# Patient Record
Sex: Male | Born: 1945
Health system: Southern US, Community
[De-identification: ages and names within clinical notes are randomized; demographics above are authoritative.]

## PROBLEM LIST (undated history)

## (undated) DIAGNOSIS — I10 Essential (primary) hypertension: Secondary | ICD-10-CM

## (undated) DIAGNOSIS — K635 Polyp of colon: Secondary | ICD-10-CM

## (undated) DIAGNOSIS — D649 Anemia, unspecified: Secondary | ICD-10-CM

## (undated) DIAGNOSIS — E7800 Pure hypercholesterolemia, unspecified: Secondary | ICD-10-CM

## (undated) DIAGNOSIS — I429 Cardiomyopathy, unspecified: Secondary | ICD-10-CM

## (undated) DIAGNOSIS — J329 Chronic sinusitis, unspecified: Secondary | ICD-10-CM

## (undated) DIAGNOSIS — K219 Gastro-esophageal reflux disease without esophagitis: Secondary | ICD-10-CM

## (undated) DIAGNOSIS — F1010 Alcohol abuse, uncomplicated: Secondary | ICD-10-CM

## (undated) DIAGNOSIS — N183 Chronic kidney disease, stage 3 (moderate): Secondary | ICD-10-CM

## (undated) HISTORY — DX: Essential (primary) hypertension: I10

## (undated) HISTORY — DX: Cardiomyopathy, unspecified: I42.9

## (undated) HISTORY — DX: Anemia, unspecified: D64.9

## (undated) HISTORY — DX: Pure hypercholesterolemia, unspecified: E78.00

## (undated) HISTORY — DX: Chronic kidney disease, stage 3 (moderate): N18.3

## (undated) HISTORY — PX: OTHER SURGICAL HISTORY: SHX169

## (undated) HISTORY — DX: Alcohol abuse, uncomplicated: F10.10

## (undated) MED FILL — Iron Sucrose Inj 20 MG/ML (Fe Equiv): INTRAVENOUS | Qty: 10 | Status: AC

---

## 2002-11-04 ENCOUNTER — Ambulatory Visit (HOSPITAL_COMMUNITY): Admission: RE | Admit: 2002-11-04 | Discharge: 2002-11-04 | Payer: Self-pay | Admitting: Internal Medicine

## 2006-03-23 ENCOUNTER — Ambulatory Visit: Payer: Self-pay | Admitting: Cardiology

## 2006-04-23 ENCOUNTER — Ambulatory Visit: Payer: Self-pay | Admitting: Cardiology

## 2008-03-31 ENCOUNTER — Ambulatory Visit: Payer: Self-pay | Admitting: Cardiology

## 2013-04-07 DIAGNOSIS — R42 Dizziness and giddiness: Secondary | ICD-10-CM

## 2013-05-10 ENCOUNTER — Encounter (INDEPENDENT_AMBULATORY_CARE_PROVIDER_SITE_OTHER): Payer: Self-pay | Admitting: *Deleted

## 2013-05-16 ENCOUNTER — Encounter (INDEPENDENT_AMBULATORY_CARE_PROVIDER_SITE_OTHER): Payer: Self-pay

## 2013-05-16 ENCOUNTER — Encounter (INDEPENDENT_AMBULATORY_CARE_PROVIDER_SITE_OTHER): Payer: Self-pay | Admitting: *Deleted

## 2013-05-23 ENCOUNTER — Ambulatory Visit (INDEPENDENT_AMBULATORY_CARE_PROVIDER_SITE_OTHER): Payer: Self-pay | Admitting: Internal Medicine

## 2013-10-11 ENCOUNTER — Other Ambulatory Visit (INDEPENDENT_AMBULATORY_CARE_PROVIDER_SITE_OTHER): Payer: Self-pay | Admitting: *Deleted

## 2013-10-11 ENCOUNTER — Encounter (INDEPENDENT_AMBULATORY_CARE_PROVIDER_SITE_OTHER): Payer: Self-pay | Admitting: Internal Medicine

## 2013-10-11 ENCOUNTER — Ambulatory Visit (INDEPENDENT_AMBULATORY_CARE_PROVIDER_SITE_OTHER): Payer: Commercial Indemnity | Admitting: Internal Medicine

## 2013-10-11 ENCOUNTER — Telehealth (INDEPENDENT_AMBULATORY_CARE_PROVIDER_SITE_OTHER): Payer: Self-pay | Admitting: *Deleted

## 2013-10-11 VITALS — BP 128/64 | HR 68 | Temp 98.2°F | Ht 65.0 in | Wt 189.4 lb

## 2013-10-11 DIAGNOSIS — E7800 Pure hypercholesterolemia, unspecified: Secondary | ICD-10-CM | POA: Insufficient documentation

## 2013-10-11 DIAGNOSIS — Z860100 Personal history of colon polyps, unspecified: Secondary | ICD-10-CM | POA: Insufficient documentation

## 2013-10-11 DIAGNOSIS — I10 Essential (primary) hypertension: Secondary | ICD-10-CM | POA: Insufficient documentation

## 2013-10-11 DIAGNOSIS — D649 Anemia, unspecified: Secondary | ICD-10-CM

## 2013-10-11 DIAGNOSIS — D631 Anemia in chronic kidney disease: Secondary | ICD-10-CM | POA: Insufficient documentation

## 2013-10-11 DIAGNOSIS — Z1211 Encounter for screening for malignant neoplasm of colon: Secondary | ICD-10-CM

## 2013-10-11 HISTORY — DX: Anemia, unspecified: D64.9

## 2013-10-11 LAB — CBC WITH DIFFERENTIAL/PLATELET
Basophils Absolute: 0.1 10*3/uL (ref 0.0–0.1)
Basophils Relative: 1 % (ref 0–1)
Eosinophils Absolute: 0.3 10*3/uL (ref 0.0–0.7)
Eosinophils Relative: 4 % (ref 0–5)
HCT: 30.7 % — ABNORMAL LOW (ref 39.0–52.0)
Hemoglobin: 10.5 g/dL — ABNORMAL LOW (ref 13.0–17.0)
Lymphocytes Relative: 32 % (ref 12–46)
Lymphs Abs: 2.1 10*3/uL (ref 0.7–4.0)
MCH: 31.6 pg (ref 26.0–34.0)
MCHC: 34.2 g/dL (ref 30.0–36.0)
MCV: 92.5 fL (ref 78.0–100.0)
Monocytes Absolute: 0.5 10*3/uL (ref 0.1–1.0)
Monocytes Relative: 8 % (ref 3–12)
Neutro Abs: 3.7 10*3/uL (ref 1.7–7.7)
Neutrophils Relative %: 55 % (ref 43–77)
Platelets: 427 10*3/uL — ABNORMAL HIGH (ref 150–400)
RBC: 3.32 MIL/uL — ABNORMAL LOW (ref 4.22–5.81)
RDW: 14.5 % (ref 11.5–15.5)
WBC: 6.6 10*3/uL (ref 4.0–10.5)

## 2013-10-11 MED ORDER — PEG-KCL-NACL-NASULF-NA ASC-C 100 G PO SOLR: 1.0000 | Freq: Once | ORAL | Status: DC

## 2013-10-11 NOTE — Progress Notes (Signed)
Subjective:     Patient ID: Roger Hansen, male   DOB: 1946/06/18, 67 y.o.   MRN: [id number]  HPIJerome is a 67 yr old male referred to our office for surveillance colonoscopy. Ox of colon polyps.   His last colonoscopy wa in 2011. Biopsy revealed. Colon cecum: Tubular adenoma with focal severe dysplasia.   Ascending: Tubular adenoma. Transverse: tubular adenoma. Rectum: tubular adenoma.  Appetite is good. No weight loss.  No abdominal pain.  He has a BM daily. No melena or bright red rectal bleeding per patient.  Hx of anemia and heme positive stools and underwent a Colonoscopy in 2011.  08/08/2013 H and H 11.0 and 34.9, MCV 95, WBC 5.5 Glucose 102, BUN 19, Creatinine 0.99, NA 140, K 3.7, Chloride 101, Calcium 9.5, Protein 7.4, Albumin 4.2, Total bili 0.7, ALP 84, AST 18, ALT 13  Review of Systems see hpi Current Outpatient Prescriptions  Medication Sig Dispense Refill  . aspirin 81 MG tablet Take 81 mg by mouth daily.      . cholecalciferol (VITAMIN D) 400 UNITS TABS tablet Take 1,000 Units by mouth.      . folic acid (FOLVITE) 1 MG tablet Take 1 mg by mouth daily.      Marland Kitchen gemfibrozil (LOPID) 600 MG tablet Take 600 mg by mouth 2 (two) times daily before a meal.      . losartan (COZAAR) 50 MG tablet Take 50 mg by mouth daily.      . potassium chloride (K-DUR) 10 MEQ tablet Take 20 mEq by mouth 2 (two) times daily.      . vitamin B-12 (CYANOCOBALAMIN) 1000 MCG tablet Take 1,000 mcg by mouth daily.       No current facility-administered medications for this visit.   Past Medical History  Diagnosis Date  . Hypertension   . High cholesterol    History reviewed. No pertinent past surgical history. Not on File      Objective:   Physical Exam  Filed Vitals:   10/11/13 1038  BP: 128/64  Pulse: 68  Temp: 98.2 F (36.8 C)  Height: 5\' 5"  (1.651 m)  Weight: 189 lb 6.4 oz (85.911 kg)   Alert and oriented. Skin warm and dry. Oral mucosa is moist.   . Sclera anicteric, conjunctivae is  pink. Thyroid not enlarged. No cervical lymphadenopathy. Lungs clear. Heart regular rate and rhythm.  Abdomen is soft. Bowel sounds are positive. No hepatomegaly. No abdominal masses felt. No tenderness.  No edema to lower extremities.  Stool brown and guaiac negative.     Assessment:    Hx of colon polyps. Needs surveillance colonoscopy. Hx of anemia    Plan:    Surveillance colonoscopy for hx of polyps.  CBC today

## 2013-10-11 NOTE — Patient Instructions (Signed)
CBC, Colonoscopy

## 2013-10-11 NOTE — Telephone Encounter (Signed)
Patient needs movi prep 

## 2013-10-12 ENCOUNTER — Encounter (HOSPITAL_COMMUNITY): Payer: Self-pay | Admitting: Pharmacy Technician

## 2013-10-26 ENCOUNTER — Encounter (HOSPITAL_COMMUNITY)
Admission: RE | Disposition: A | Discharge status: Home / Self Care | Payer: Self-pay | Source: Ambulatory Visit | Attending: Internal Medicine

## 2013-10-26 ENCOUNTER — Ambulatory Visit (HOSPITAL_COMMUNITY)
Admission: RE | Admit: 2013-10-26 | Discharge: 2013-10-26 | Disposition: A | Discharge status: Home / Self Care | Payer: Managed Care, Other (non HMO) | Source: Ambulatory Visit | Attending: Internal Medicine | Admitting: Internal Medicine

## 2013-10-26 ENCOUNTER — Encounter (HOSPITAL_COMMUNITY): Payer: Self-pay | Admitting: *Deleted

## 2013-10-26 DIAGNOSIS — D126 Benign neoplasm of colon, unspecified: Secondary | ICD-10-CM

## 2013-10-26 DIAGNOSIS — K644 Residual hemorrhoidal skin tags: Secondary | ICD-10-CM | POA: Insufficient documentation

## 2013-10-26 DIAGNOSIS — I10 Essential (primary) hypertension: Secondary | ICD-10-CM | POA: Insufficient documentation

## 2013-10-26 DIAGNOSIS — Z860100 Personal history of colon polyps, unspecified: Secondary | ICD-10-CM | POA: Insufficient documentation

## 2013-10-26 DIAGNOSIS — Z79899 Other long term (current) drug therapy: Secondary | ICD-10-CM | POA: Insufficient documentation

## 2013-10-26 DIAGNOSIS — Z09 Encounter for follow-up examination after completed treatment for conditions other than malignant neoplasm: Secondary | ICD-10-CM | POA: Insufficient documentation

## 2013-10-26 HISTORY — PX: COLONOSCOPY: SHX5424

## 2013-10-26 HISTORY — DX: Chronic sinusitis, unspecified: J32.9

## 2013-10-26 HISTORY — DX: Polyp of colon: K63.5

## 2013-10-26 HISTORY — DX: Gastro-esophageal reflux disease without esophagitis: K21.9

## 2013-10-26 SURGERY — COLONOSCOPY
Anesthesia: Moderate Sedation

## 2013-10-26 MED ORDER — STERILE WATER FOR IRRIGATION IR SOLN
Status: DC | PRN
  Administered 2013-10-26: 12:00:00

## 2013-10-26 MED ORDER — SODIUM CHLORIDE 0.9 % IV SOLN
INTRAVENOUS | Status: DC
  Administered 2013-10-26: 12:00:00 via INTRAVENOUS

## 2013-10-26 MED ORDER — MEPERIDINE HCL 50 MG/ML IJ SOLN
INTRAMUSCULAR | Status: AC
  Filled 2013-10-26: qty 1

## 2013-10-26 MED ORDER — MEPERIDINE HCL 50 MG/ML IJ SOLN
INTRAMUSCULAR | Status: DC | PRN
  Administered 2013-10-26 (×2): 25 mg via INTRAVENOUS

## 2013-10-26 MED ORDER — MIDAZOLAM HCL 5 MG/5ML IJ SOLN
INTRAMUSCULAR | Status: AC
  Filled 2013-10-26: qty 10

## 2013-10-26 MED ORDER — MIDAZOLAM HCL 5 MG/5ML IJ SOLN
INTRAMUSCULAR | Status: DC | PRN
Start: 2013-10-26 — End: 2013-10-26
  Administered 2013-10-26 (×3): 2 mg via INTRAVENOUS

## 2013-10-26 NOTE — H&P (Signed)
Roger Hansen is an 67 y.o. male.   Chief Complaint: Patient's here for colonoscopy. HPI: Patient is a 66 year old African American male with history of chronic adenomas and is here for surveillance examination. His last colonoscopy was in June 2011 at [facility] in [place] with removal of multiple adenomas and one polyp had high-grade dysplasia. He denies abdominal pain change in his bowel habits or rectal bleeding. Family history is negative for CRC.  Past Medical History  Diagnosis Date  . Hypertension   . High cholesterol   . GERD (gastroesophageal reflux disease)   . Sinus infection   . Colon polyps     Past Surgical History  Procedure Laterality Date  . Colonoscopy with polypectomy      Family History  Problem Relation Age of Onset  . Colon cancer Neg Hx    Social History:  reports that he has quit smoking. He does not have any smokeless tobacco history on file. He reports that he drinks alcohol. He reports that he does not use illicit drugs.  Allergies: No Known Allergies  Medications Prior to Admission  Medication Sig Dispense Refill  . cholecalciferol (VITAMIN D) 1000 UNITS tablet Take 1,000 Units by mouth 2 (two) times daily.      . folic acid (FOLVITE) 1 MG tablet Take 1 mg by mouth daily.      Marland Kitchen gemfibrozil (LOPID) 600 MG tablet Take 600 mg by mouth 2 (two) times daily before a meal.      . losartan (COZAAR) 50 MG tablet Take 50 mg by mouth daily.      Marland Kitchen omeprazole (PRILOSEC) 20 MG capsule Take 20 mg by mouth daily as needed (Acid Reflux).      . peg 3350 powder (MOVIPREP) 100 G SOLR Take 1 kit (200 g total) by mouth once.  1 kit  0  . potassium chloride (K-DUR) 10 MEQ tablet Take 20 mEq by mouth 2 (two) times daily.      . vitamin B-12 (CYANOCOBALAMIN) 1000 MCG tablet Take 1,000 mcg by mouth daily.      Marland Kitchen aspirin 81 MG tablet Take 81 mg by mouth daily.        No results found for this or any previous visit (from the past 48 hour(s)). No results  found.  ROS  Blood pressure 133/84, pulse 106, temperature 98.3 F (36.8 C), temperature source Oral, height 5\' 5"  (1.651 m), weight 180 lb (81.647 kg), SpO2 98.00%. Physical Exam  Constitutional: He appears well-developed and well-nourished.  HENT:  Mouth/Throat: Oropharynx is clear and moist.  Eyes: Conjunctivae are normal. No scleral icterus.  Neck: No thyromegaly present.  Cardiovascular: Normal rate, regular rhythm and normal heart sounds.   No murmur heard. Respiratory: Effort normal and breath sounds normal.  GI: Soft. He exhibits no distension and no mass. There is no tenderness.  Musculoskeletal: He exhibits no edema.  Lymphadenopathy:    He has no cervical adenopathy.  Neurological: He is alert.  Skin: Skin is warm and dry.     Assessment/Plan History of multiple colonic adenomas. Surveillance colonoscopy.  Roger Hansen,Roger Hansen 10/26/2013, 12:23 PM

## 2013-10-26 NOTE — Op Note (Addendum)
COLONOSCOPY PROCEDURE REPORT  PATIENT:  Roger Hansen  MR#:  [id number] Birthdate:  12-Sep-1946, 67 y.o., male Endoscopist:  Dr. Malissa Hippo, MD Referred By:  Dr. Juliette Alcide, MD  Procedure Date: 10/26/2013  Procedure:   Colonoscopy  Indications:  Patient is 67 year old African American male with history of colonic adenomas who is here for surveillance colonoscopy. His last exam was in June 2011 with removal of multiple polyps which were tubular adenomas and 1 with high-grade dysplasia. Family history is negative for CRC.  Informed Consent:  The procedure and risks were reviewed with the patient and informed consent was obtained.  Medications:  Demerol 50 mg IV Versed 6 mg IV  Description of procedure:  After a digital rectal exam was performed, that colonoscope was advanced from the anus through the rectum and colon to the area of the cecum, ileocecal valve and appendiceal orifice. The cecum was deeply intubated. These structures were well-seen and photographed for the record. From the level of the cecum and ileocecal valve, the scope was slowly and cautiously withdrawn. The mucosal surfaces were carefully surveyed utilizing scope tip to flexion to facilitate fold flattening as needed. The scope was pulled down into the rectum where a thorough exam including retroflexion was performed. Terminal ileum was also examined.  Findings:   Prep satisfactory. Small cecal polyp located at the edge of ileocecal valve. It was cold snared. Small polyp at splenic flexure was also cold snared. Both of these polyps are submitted together. Normal rectal mucosa. Small hemorrhoids below the dentate line.   Therapeutic/Diagnostic Maneuvers Performed:  See above  Complications:  None  Cecal Withdrawal Time:  17 minutes  Impression:  Normal mucosa of terminal ileum. Two small polyps were cold snared and submitted together. These were located at cecum and splenic flexure. Small external  hemorrhoids.  Recommendations:  Standard instructions given. I will contact patient with biopsy results and further recommendations.  Kwane Rohl U  10/26/2013 1:09 PM  CC: Dr. Juliette Alcide, MD & Dr. Bonnetta Barry ref. provider found

## 2013-10-31 ENCOUNTER — Encounter (HOSPITAL_COMMUNITY): Payer: Self-pay | Admitting: Internal Medicine

## 2013-11-04 ENCOUNTER — Encounter (INDEPENDENT_AMBULATORY_CARE_PROVIDER_SITE_OTHER): Payer: Self-pay | Admitting: *Deleted

## 2013-12-05 ENCOUNTER — Encounter (INDEPENDENT_AMBULATORY_CARE_PROVIDER_SITE_OTHER): Payer: Self-pay

## 2014-10-23 DIAGNOSIS — I10 Essential (primary) hypertension: Secondary | ICD-10-CM | POA: Diagnosis not present

## 2014-10-23 DIAGNOSIS — L03316 Cellulitis of umbilicus: Secondary | ICD-10-CM | POA: Diagnosis not present

## 2014-10-23 DIAGNOSIS — Z8249 Family history of ischemic heart disease and other diseases of the circulatory system: Secondary | ICD-10-CM | POA: Diagnosis not present

## 2014-10-23 DIAGNOSIS — Z833 Family history of diabetes mellitus: Secondary | ICD-10-CM | POA: Diagnosis not present

## 2014-10-23 DIAGNOSIS — Z79899 Other long term (current) drug therapy: Secondary | ICD-10-CM | POA: Diagnosis not present

## 2014-10-23 DIAGNOSIS — Z7982 Long term (current) use of aspirin: Secondary | ICD-10-CM | POA: Diagnosis not present

## 2014-10-23 DIAGNOSIS — Z87891 Personal history of nicotine dependence: Secondary | ICD-10-CM | POA: Diagnosis not present

## 2014-10-23 DIAGNOSIS — G629 Polyneuropathy, unspecified: Secondary | ICD-10-CM | POA: Diagnosis not present

## 2014-10-23 DIAGNOSIS — E876 Hypokalemia: Secondary | ICD-10-CM | POA: Diagnosis not present

## 2014-10-25 DIAGNOSIS — L03316 Cellulitis of umbilicus: Secondary | ICD-10-CM | POA: Diagnosis not present

## 2014-10-25 DIAGNOSIS — Z23 Encounter for immunization: Secondary | ICD-10-CM | POA: Diagnosis not present

## 2014-10-25 DIAGNOSIS — R197 Diarrhea, unspecified: Secondary | ICD-10-CM | POA: Diagnosis not present

## 2014-10-26 DIAGNOSIS — R197 Diarrhea, unspecified: Secondary | ICD-10-CM | POA: Diagnosis not present

## 2015-02-20 DIAGNOSIS — K219 Gastro-esophageal reflux disease without esophagitis: Secondary | ICD-10-CM | POA: Diagnosis not present

## 2015-02-20 DIAGNOSIS — E039 Hypothyroidism, unspecified: Secondary | ICD-10-CM | POA: Diagnosis not present

## 2015-02-27 DIAGNOSIS — D649 Anemia, unspecified: Secondary | ICD-10-CM | POA: Diagnosis not present

## 2015-02-27 DIAGNOSIS — L2089 Other atopic dermatitis: Secondary | ICD-10-CM | POA: Diagnosis not present

## 2015-02-27 DIAGNOSIS — K219 Gastro-esophageal reflux disease without esophagitis: Secondary | ICD-10-CM | POA: Diagnosis not present

## 2015-02-27 DIAGNOSIS — G6289 Other specified polyneuropathies: Secondary | ICD-10-CM | POA: Diagnosis not present

## 2015-02-27 DIAGNOSIS — J309 Allergic rhinitis, unspecified: Secondary | ICD-10-CM | POA: Diagnosis not present

## 2015-02-27 DIAGNOSIS — Z1389 Encounter for screening for other disorder: Secondary | ICD-10-CM | POA: Diagnosis not present

## 2015-02-27 DIAGNOSIS — E782 Mixed hyperlipidemia: Secondary | ICD-10-CM | POA: Diagnosis not present

## 2015-02-27 DIAGNOSIS — I10 Essential (primary) hypertension: Secondary | ICD-10-CM | POA: Diagnosis not present

## 2015-02-27 DIAGNOSIS — E559 Vitamin D deficiency, unspecified: Secondary | ICD-10-CM | POA: Diagnosis not present

## 2015-04-19 DIAGNOSIS — J019 Acute sinusitis, unspecified: Secondary | ICD-10-CM | POA: Diagnosis not present

## 2015-07-04 DIAGNOSIS — K219 Gastro-esophageal reflux disease without esophagitis: Secondary | ICD-10-CM | POA: Diagnosis not present

## 2015-07-04 DIAGNOSIS — I10 Essential (primary) hypertension: Secondary | ICD-10-CM | POA: Diagnosis not present

## 2015-07-05 DIAGNOSIS — S40862A Insect bite (nonvenomous) of left upper arm, initial encounter: Secondary | ICD-10-CM | POA: Diagnosis not present

## 2015-07-05 DIAGNOSIS — R634 Abnormal weight loss: Secondary | ICD-10-CM | POA: Diagnosis not present

## 2015-08-14 DIAGNOSIS — K219 Gastro-esophageal reflux disease without esophagitis: Secondary | ICD-10-CM | POA: Diagnosis not present

## 2015-08-14 DIAGNOSIS — E782 Mixed hyperlipidemia: Secondary | ICD-10-CM | POA: Diagnosis not present

## 2015-08-14 DIAGNOSIS — E559 Vitamin D deficiency, unspecified: Secondary | ICD-10-CM | POA: Diagnosis not present

## 2015-08-14 DIAGNOSIS — D649 Anemia, unspecified: Secondary | ICD-10-CM | POA: Diagnosis not present

## 2015-11-23 DIAGNOSIS — I10 Essential (primary) hypertension: Secondary | ICD-10-CM | POA: Diagnosis not present

## 2015-11-23 DIAGNOSIS — R634 Abnormal weight loss: Secondary | ICD-10-CM | POA: Diagnosis not present

## 2015-11-23 DIAGNOSIS — D649 Anemia, unspecified: Secondary | ICD-10-CM | POA: Diagnosis not present

## 2015-11-23 DIAGNOSIS — E785 Hyperlipidemia, unspecified: Secondary | ICD-10-CM | POA: Diagnosis not present

## 2015-11-23 DIAGNOSIS — R197 Diarrhea, unspecified: Secondary | ICD-10-CM | POA: Diagnosis not present

## 2015-11-23 DIAGNOSIS — E782 Mixed hyperlipidemia: Secondary | ICD-10-CM | POA: Diagnosis not present

## 2016-01-10 DIAGNOSIS — H6503 Acute serous otitis media, bilateral: Secondary | ICD-10-CM | POA: Diagnosis not present

## 2016-01-10 DIAGNOSIS — J209 Acute bronchitis, unspecified: Secondary | ICD-10-CM | POA: Diagnosis not present

## 2016-01-10 DIAGNOSIS — J069 Acute upper respiratory infection, unspecified: Secondary | ICD-10-CM | POA: Diagnosis not present

## 2016-01-10 DIAGNOSIS — R0609 Other forms of dyspnea: Secondary | ICD-10-CM | POA: Diagnosis not present

## 2016-07-16 DIAGNOSIS — Z7982 Long term (current) use of aspirin: Secondary | ICD-10-CM | POA: Diagnosis not present

## 2016-07-16 DIAGNOSIS — Z79899 Other long term (current) drug therapy: Secondary | ICD-10-CM | POA: Diagnosis not present

## 2016-07-16 DIAGNOSIS — I10 Essential (primary) hypertension: Secondary | ICD-10-CM | POA: Diagnosis not present

## 2016-07-16 DIAGNOSIS — G629 Polyneuropathy, unspecified: Secondary | ICD-10-CM | POA: Diagnosis not present

## 2016-07-16 DIAGNOSIS — E785 Hyperlipidemia, unspecified: Secondary | ICD-10-CM | POA: Diagnosis not present

## 2016-07-16 DIAGNOSIS — R079 Chest pain, unspecified: Secondary | ICD-10-CM | POA: Diagnosis not present

## 2016-07-16 DIAGNOSIS — D649 Anemia, unspecified: Secondary | ICD-10-CM | POA: Diagnosis not present

## 2016-07-16 DIAGNOSIS — M549 Dorsalgia, unspecified: Secondary | ICD-10-CM | POA: Diagnosis not present

## 2016-07-16 DIAGNOSIS — R072 Precordial pain: Secondary | ICD-10-CM | POA: Diagnosis not present

## 2016-07-17 DIAGNOSIS — K8020 Calculus of gallbladder without cholecystitis without obstruction: Secondary | ICD-10-CM | POA: Diagnosis not present

## 2016-07-24 DIAGNOSIS — R0609 Other forms of dyspnea: Secondary | ICD-10-CM | POA: Diagnosis not present

## 2016-07-24 DIAGNOSIS — E782 Mixed hyperlipidemia: Secondary | ICD-10-CM | POA: Diagnosis not present

## 2016-07-24 DIAGNOSIS — R350 Frequency of micturition: Secondary | ICD-10-CM | POA: Diagnosis not present

## 2016-07-24 DIAGNOSIS — I10 Essential (primary) hypertension: Secondary | ICD-10-CM | POA: Diagnosis not present

## 2016-07-24 DIAGNOSIS — R634 Abnormal weight loss: Secondary | ICD-10-CM | POA: Diagnosis not present

## 2016-07-24 DIAGNOSIS — K219 Gastro-esophageal reflux disease without esophagitis: Secondary | ICD-10-CM | POA: Diagnosis not present

## 2016-07-24 DIAGNOSIS — D649 Anemia, unspecified: Secondary | ICD-10-CM | POA: Diagnosis not present

## 2016-07-25 DIAGNOSIS — I700 Atherosclerosis of aorta: Secondary | ICD-10-CM | POA: Diagnosis not present

## 2016-07-25 DIAGNOSIS — K8020 Calculus of gallbladder without cholecystitis without obstruction: Secondary | ICD-10-CM | POA: Diagnosis not present

## 2016-07-25 DIAGNOSIS — N281 Cyst of kidney, acquired: Secondary | ICD-10-CM | POA: Diagnosis not present

## 2016-08-13 ENCOUNTER — Encounter (HOSPITAL_COMMUNITY): Payer: Medicare Other | Attending: Hematology | Admitting: Hematology

## 2016-08-13 ENCOUNTER — Encounter (HOSPITAL_COMMUNITY): Payer: Medicare Other

## 2016-08-13 ENCOUNTER — Encounter (HOSPITAL_COMMUNITY): Payer: Self-pay

## 2016-08-13 VITALS — BP 104/61 | HR 90 | Temp 98.2°F | Resp 16 | Ht 65.0 in | Wt 165.5 lb

## 2016-08-13 DIAGNOSIS — F1010 Alcohol abuse, uncomplicated: Secondary | ICD-10-CM

## 2016-08-13 DIAGNOSIS — D696 Thrombocytopenia, unspecified: Secondary | ICD-10-CM

## 2016-08-13 DIAGNOSIS — D649 Anemia, unspecified: Secondary | ICD-10-CM | POA: Diagnosis not present

## 2016-08-13 DIAGNOSIS — K8689 Other specified diseases of pancreas: Secondary | ICD-10-CM | POA: Diagnosis not present

## 2016-08-13 DIAGNOSIS — D72819 Decreased white blood cell count, unspecified: Secondary | ICD-10-CM

## 2016-08-13 DIAGNOSIS — K838 Other specified diseases of biliary tract: Secondary | ICD-10-CM | POA: Diagnosis not present

## 2016-08-13 LAB — RETICULOCYTES
RBC.: 3.17 MIL/uL — ABNORMAL LOW (ref 4.22–5.81)
Retic Count, Absolute: 57.1 10*3/uL (ref 19.0–186.0)
Retic Ct Pct: 1.8 % (ref 0.4–3.1)

## 2016-08-13 LAB — CBC WITH DIFFERENTIAL/PLATELET
Basophils Absolute: 0 10*3/uL (ref 0.0–0.1)
Basophils Relative: 1 %
Eosinophils Absolute: 0.3 10*3/uL (ref 0.0–0.7)
Eosinophils Relative: 4 %
HCT: 28.4 % — ABNORMAL LOW (ref 39.0–52.0)
Hemoglobin: 9.2 g/dL — ABNORMAL LOW (ref 13.0–17.0)
Lymphocytes Relative: 24 %
Lymphs Abs: 1.6 10*3/uL (ref 0.7–4.0)
MCH: 29 pg (ref 26.0–34.0)
MCHC: 32.4 g/dL (ref 30.0–36.0)
MCV: 89.6 fL (ref 78.0–100.0)
Monocytes Absolute: 0.4 10*3/uL (ref 0.1–1.0)
Monocytes Relative: 6 %
Neutro Abs: 4.4 10*3/uL (ref 1.7–7.7)
Neutrophils Relative %: 65 %
Platelets: 212 10*3/uL (ref 150–400)
RBC: 3.17 MIL/uL — ABNORMAL LOW (ref 4.22–5.81)
RDW: 18 % — ABNORMAL HIGH (ref 11.5–15.5)
WBC: 6.7 10*3/uL (ref 4.0–10.5)

## 2016-08-13 LAB — COMPREHENSIVE METABOLIC PANEL
ALT: 15 U/L — ABNORMAL LOW (ref 17–63)
AST: 29 U/L (ref 15–41)
Albumin: 3.9 g/dL (ref 3.5–5.0)
Alkaline Phosphatase: 72 U/L (ref 38–126)
Anion gap: 13 (ref 5–15)
BUN: 25 mg/dL — ABNORMAL HIGH (ref 6–20)
CO2: 26 mmol/L (ref 22–32)
Calcium: 9.5 mg/dL (ref 8.9–10.3)
Chloride: 96 mmol/L — ABNORMAL LOW (ref 101–111)
Creatinine, Ser: 2.26 mg/dL — ABNORMAL HIGH (ref 0.61–1.24)
GFR calc Af Amer: 33 mL/min — ABNORMAL LOW (ref >60)
GFR calc non Af Amer: 28 mL/min — ABNORMAL LOW (ref >60)
Glucose, Bld: 95 mg/dL (ref 65–99)
Potassium: 4 mmol/L (ref 3.5–5.1)
Sodium: 135 mmol/L (ref 135–145)
Total Bilirubin: 0.9 mg/dL (ref 0.3–1.2)
Total Protein: 7.3 g/dL (ref 6.5–8.1)

## 2016-08-13 LAB — LACTATE DEHYDROGENASE: LDH: 112 U/L (ref 98–192)

## 2016-08-13 LAB — IRON AND TIBC
Iron: 37 ug/dL — ABNORMAL LOW (ref 45–182)
Saturation Ratios: 12 % — ABNORMAL LOW (ref 17.9–39.5)
TIBC: 301 ug/dL (ref 250–450)
UIBC: 264 ug/dL

## 2016-08-13 LAB — FERRITIN: Ferritin: 311 ng/mL (ref 24–336)

## 2016-08-13 LAB — VITAMIN B12: Vitamin B-12: 495 pg/mL (ref 180–914)

## 2016-08-13 NOTE — Progress Notes (Signed)
Fax request for MRI sent to [facility] per Dr. Irene Limbo.

## 2016-08-13 NOTE — Progress Notes (Signed)
Marland Kitchen    HEMATOLOGY/ONCOLOGY CONSULTATION NOTE  Date of Service: .08/13/2016  Patient Care Team: Curlene Labrum, MD as PCP - General  CHIEF COMPLAINTS/PURPOSE OF CONSULTATION:  Anemia  HISTORY OF PRESENTING ILLNESS:  Roger Hansen is a wonderful 70 y.o. male who has been referred to [place] by Dr .Curlene Labrum, MD  for evaluation and management of Anemia.  Patient  reports than he has had chronic anemia for several years and previously was seen in clinic by Dr. Janae Sauce and had IV iron  more than 5 years ago.  Had recent labs    Had recent labs with his primary care physician on 07/17/2016 which showed a hemoglobin of 7.5 with an MCV of 91.6, WBC count of 4.9k and later count of 128k. Previous labs from November 2015 showed a hemoglobin of 11 with an MCV of 95 and normal platelets and WBC count.  Anemia workup showed a ferritin level of 85 with 13% iron saturation, and elevated LDH of 228.  He was recently admitted to [facility] for chest pain and abdominal pain and CBC done there showed some leukopenia of 3.2k hemoglobin of 8 and MCV of 93 with an RDW of 19.1 and platelet count of 286k. B-12 level was 541 with the folate level of 11.6. Cardiac workup was apparently negative. Workup for abdominal pain with an ultrasound of the abdomen showed mild dilatation of the common bile duct with prominence of the pancreatic duct and peripancreatic cystic lesion/fluid collection. Gallstones without evidence of acute cholecystitis. Bilateral mildly complex renal cysts. MRI/MRCP of the abdomen was recommended. Patient reports he had an outpatient MRI/MRCP of the abdomen which is not currently available for [place] to review - this has been requested from [facility]. Patient reports some chronic fatigue. Notes about 10 pound weight loss over the last 6 months. Notes that he has been eating poorly and using a fair amount of alcohol. Reports drinking more than 1 pint of hard liquor several times  a week. No overt change in bowel habits. Denies melena hematochezia or any other overt bleeding. Notes no hematuria. No new bone pains. Has been taking daily H37 and folic acid with normal iron replacement or multivitamin.  No issues with infections. No issues easy bruising or bleeding.    MEDICAL HISTORY:  Past Medical History:  Diagnosis Date  . Colon polyps   . GERD (gastroesophageal reflux disease)   . High cholesterol   . Hypertension   . Sinus infection   Chest pain and recent dobutamine echo done on 07/22/2016 which was negative ejection fraction of 60% Chronic anemia Alcohol abuse  peripheral neuropathy thought to be due to alcohol abuse   SURGICAL HISTORY: Past Surgical History:  Procedure Laterality Date  . COLONOSCOPY N/A 10/26/2013   Procedure: COLONOSCOPY;  Surgeon: Rogene Houston, MD;  Location: [facility];  Service: Endoscopy;  Laterality: N/A;  325-moved to 1230 Ann to notify pt  . Colonoscopy with polypectomy      SOCIAL HISTORY: Social History   Social History  . Marital status: Married    Spouse name: N/A  . Number of children: N/A  . Years of education: N/A   Occupational History  . Not on file.   Social History Main Topics  . Smoking status: Former Smoker    Packs/day: 0.50    Years: 20.00  . Smokeless tobacco: Never Used  . Alcohol use Yes     Comment: occasionally  . Drug use: No  .  Sexual activity: Not on file   Other Topics Concern  . Not on file   Social History Narrative  . No narrative on file   Alcohol abuse again in excess of 1 pint of hard liquor several times a week.  FAMILY HISTORY: Family History  Problem Relation Age of Onset  . Colon cancer Neg Hx     ALLERGIES:  has No Known Allergies.  MEDICATIONS:  Current Outpatient Prescriptions  Medication Sig Dispense Refill  . aspirin 81 MG tablet Take 81 mg by mouth daily.    . cholecalciferol (VITAMIN D) 1000 UNITS tablet Take 1,000 Units by mouth 2 (two) times  daily.    . folic acid (FOLVITE) 1 MG tablet Take 1 mg by mouth daily.    Marland Kitchen gemfibrozil (LOPID) 600 MG tablet Take 600 mg by mouth 2 (two) times daily before a meal.    . losartan (COZAAR) 50 MG tablet Take 50 mg by mouth daily.    Marland Kitchen omeprazole (PRILOSEC) 20 MG capsule Take 20 mg by mouth daily as needed (Acid Reflux).    . potassium chloride (K-DUR) 10 MEQ tablet Take 20 mEq by mouth 2 (two) times daily.    . vitamin B-12 (CYANOCOBALAMIN) 1000 MCG tablet Take 1,000 mcg by mouth daily.     No current facility-administered medications for this visit.     REVIEW OF SYSTEMS:    10 Point review of Systems was done is negative except as noted above.  PHYSICAL EXAMINATION: ECOG PERFORMANCE STATUS: 2 - Symptomatic, <50% confined to bed  . Vitals:   08/13/16 0838  BP: 104/61  Pulse: 90  Resp: 16  Temp: 98.2 F (36.8 C)   Filed Weights   08/13/16 0838  Weight: 165 lb 8 oz (75.1 kg)   .Body mass index is 27.54 kg/m.  GENERAL:alert, in no acute distress and comfortable SKIN: skin color, texture, turgor are normal, no rashes or significant lesions EYES: normal, conjunctiva are pink and non-injected, sclera clear OROPHARYNX:no exudate, no erythema and lips, buccal mucosa, and tongue normal  NECK: supple, no JVD, thyroid normal size, non-tender, without nodularity LYMPH:  no palpable lymphadenopathy in the cervical, axillary or inguinal LUNGS: clear to auscultation with normal respiratory effort HEART: regular rate & rhythm,  no murmurs and no lower extremity edema ABDOMEN: abdomen soft, non-tender, normoactive bowel sounds , No palpable hepatosplenomegaly. Musculoskeletal: No pedal edema. PSYCH: alert & oriented x 3 with fluent speech NEURO: no focal motor/sensory deficits  LABORATORY DATA:  I have reviewed the data as listed  Labs requested.  RADIOGRAPHIC STUDIES: I have personally reviewed the radiological images as listed and agreed with the findings in the report. No  results found.  ASSESSMENT & PLAN:   70 year old African-American male with  #1 normocytic normochromic anemia.  His obvious alcohol abuse is certainly a significant element of his anemia which has been chronic. His peripancreatic cystic lesion would need to be studied further. Patient reports that he had an MRI/MRCP results of which we will try to obtain. No other focal symptoms suggesting obvious tumor. Ultrasound of the abdomen did not show cirrhosis or splenomegaly .  his LDH was borderline elevated at outside labs will rule out hemolysis   #2 mild thrombocytopenia likely from alcohol abuse. - Seems to have improved during hospitalization .  #3 mild leukopenia  -likely from alcohol abuse .   Plan -patient was strongly counseled on complete alcohol abstinence .  -he notes that [facility] -We'll get anemia workup as ordered below. CBC, CMP, LDH, haptoglobin, myeloma panel with a repeat ferritin/B12 off alcohol for about a week or so now. Continue folic acid, E23 and add B complex. -Repeat CBC with differential in 4 weeks of alcohol check for improvement. -Further treatment as per labs findings. -Patient recommended to call us if he develops any presyncopal/syncopal symptoms, shortness of breath or significant fatigue that might lead to consideration for PRBC transfusion.  #4 peripancreatic cystic lesion and some mild CBD dilatation -We'll get outside MRI/MRCP results from [facility].  Return to clinic with Dr Whitney Muse in 4 weeks with rpt CBC with diff . Orders Placed This Encounter  Procedures  . CBC with Differential/Platelet    Standing Status:   Future    Number of Occurrences:   1    Standing Expiration Date:   08/13/2017  . Comprehensive metabolic panel    Standing Status:   Future    Number of Occurrences:   1    Standing Expiration Date:   08/13/2017  . Smear    Standing Status:   Future    Number of Occurrences:   1     Standing Expiration Date:   08/13/2017  . Lactate dehydrogenase (LDH)    Standing Status:   Future    Number of Occurrences:   1    Standing Expiration Date:   08/13/2017  . Haptoglobin    Standing Status:   Future    Number of Occurrences:   1    Standing Expiration Date:   08/13/2017  . Multiple Myeloma Panel (SPEP&IFE w/QIG)    Standing Status:   Future    Number of Occurrences:   1    Standing Expiration Date:   08/13/2017  . Kappa/lambda light chains    Standing Status:   Future    Number of Occurrences:   1    Standing Expiration Date:   08/13/2017  . Ferritin    Standing Status:   Future    Number of Occurrences:   1    Standing Expiration Date:   08/13/2017  . Iron and TIBC    Standing Status:   Future    Number of Occurrences:   1    Standing Expiration Date:   08/13/2017  . Vitamin B12    Standing Status:   Future    Number of Occurrences:   1    Standing Expiration Date:   08/13/2017  . Folate RBC    Standing Status:   Future    Number of Occurrences:   1    Standing Expiration Date:   08/13/2017  . CBC with Differential/Platelet    Standing Status:   Future    Number of Occurrences:   1    Standing Expiration Date:   08/13/2017  . Comprehensive metabolic panel    Standing Status:   Future    Number of Occurrences:   1    Standing Expiration Date:   08/13/2017  . Reticulocytes    Standing Status:   Future    Number of Occurrences:   1    Standing Expiration Date:   08/13/2017  . Hepatitis C antibody    Standing Status:   Future    Standing Expiration Date:   08/13/2017    All of the patients questions were answered with apparent satisfaction. The patient knows to call the clinic with any problems, questions or concerns.  I spent 60 minutes counseling  the patient face to face. The total time spent in the appointment was 60 minutes and more than 50% was on counseling and direct patient cares.    Sullivan Lone MD [place] AAHIVMS [facility] Hematology/Oncology Physician [facility]  (Office):       [phone] (Work cell):  [phone] (Fax):           [phone]  08/13/2016 8:50 AM

## 2016-08-13 NOTE — Patient Instructions (Addendum)
[place] at [facility]  Discharge Instructions:  Follow up appointment in clinic in 4 weeks.   Labs today.   _______________________________________________________________  Thank you for choosing [place] at [facility] to provide your oncology and hematology care.  To afford each patient quality time with our providers, please arrive at least 15 minutes before your scheduled appointment.  You need to re-schedule your appointment if you arrive 10 or more minutes late.  We strive to give you quality time with our providers, and arriving late affects you and other patients whose appointments are after yours.  Also, if you no show three or more times for appointments you may be dismissed from the clinic.  Again, thank you for choosing [place] at [place] hope is that these requests will allow you access to exceptional care and in a timely manner. _______________________________________________________________  If you have questions after your visit, please contact our office at [phone] between the hours of 8:30 a.m. and 5:00 p.m. Voicemails left after 4:30 p.m. will not be returned until the following business day. _______________________________________________________________  For prescription refill requests, have your pharmacy contact our office. _______________________________________________________________  Recommendations made by the consultant and any test results will be sent to your referring physician. _______________________________________________________________

## 2016-08-14 LAB — KAPPA/LAMBDA LIGHT CHAINS
Kappa free light chain: 66.3 mg/L — ABNORMAL HIGH (ref 3.3–19.4)
Kappa, lambda light chain ratio: 1.14 (ref 0.26–1.65)
Lambda free light chains: 58 mg/L — ABNORMAL HIGH (ref 5.7–26.3)

## 2016-08-14 LAB — HAPTOGLOBIN: Haptoglobin: 93 mg/dL (ref 34–200)

## 2016-08-15 LAB — MULTIPLE MYELOMA PANEL, SERUM
Albumin SerPl Elph-Mcnc: 3.7 g/dL (ref 2.9–4.4)
Albumin/Glob SerPl: 1.3 (ref 0.7–1.7)
Alpha 1: 0.2 g/dL (ref 0.0–0.4)
Alpha2 Glob SerPl Elph-Mcnc: 0.6 g/dL (ref 0.4–1.0)
B-Globulin SerPl Elph-Mcnc: 1.1 g/dL (ref 0.7–1.3)
Gamma Glob SerPl Elph-Mcnc: 1 g/dL (ref 0.4–1.8)
Globulin, Total: 2.9 g/dL (ref 2.2–3.9)
IgA: 327 mg/dL (ref 61–437)
IgG (Immunoglobin G), Serum: 1038 mg/dL (ref 700–1600)
IgM, Serum: 78 mg/dL (ref 20–172)
Total Protein ELP: 6.6 g/dL (ref 6.0–8.5)

## 2016-08-15 LAB — FOLATE RBC
Folate, Hemolysate: 413.7 ng/mL
Folate, RBC: 1483 ng/mL (ref >498)
Hematocrit: 27.9 % — ABNORMAL LOW (ref 37.5–51.0)

## 2016-09-05 DIAGNOSIS — K8020 Calculus of gallbladder without cholecystitis without obstruction: Secondary | ICD-10-CM | POA: Diagnosis not present

## 2016-09-05 DIAGNOSIS — K838 Other specified diseases of biliary tract: Secondary | ICD-10-CM | POA: Diagnosis not present

## 2016-09-05 DIAGNOSIS — Z6827 Body mass index (BMI) 27.0-27.9, adult: Secondary | ICD-10-CM | POA: Diagnosis not present

## 2016-09-05 DIAGNOSIS — E782 Mixed hyperlipidemia: Secondary | ICD-10-CM | POA: Diagnosis not present

## 2016-09-05 DIAGNOSIS — R0789 Other chest pain: Secondary | ICD-10-CM | POA: Diagnosis not present

## 2016-09-05 DIAGNOSIS — Z23 Encounter for immunization: Secondary | ICD-10-CM | POA: Diagnosis not present

## 2016-09-05 DIAGNOSIS — I5032 Chronic diastolic (congestive) heart failure: Secondary | ICD-10-CM | POA: Diagnosis not present

## 2016-09-05 DIAGNOSIS — Z1389 Encounter for screening for other disorder: Secondary | ICD-10-CM | POA: Diagnosis not present

## 2016-09-08 NOTE — Progress Notes (Signed)
HEMATOLOGY/ONCOLOGY PROGRESS NOTE  Date of Service: .09/09/2016  Patient Care Team: Curlene Labrum, MD as PCP - General  CHIEF COMPLAINTS/PURPOSE OF CONSULTATION:  Anemia  HISTORY OF PRESENTING ILLNESS:  Roger Hansen is a wonderful 70 y.o. male who has been referred to [place] by Dr .Curlene Labrum, MD  for evaluation and management of Anemia.  Patient  reports than he has had chronic anemia for several years and previously was seen in clinic by Dr. Janae Sauce and had IV iron  more than 5 years ago.   Had recent labs with his primary care physician on 07/17/2016 which showed a hemoglobin of 7.5 with an MCV of 91.6, WBC count of 4.9k and later count of 128k. Previous labs from November 2015 showed a hemoglobin of 11 with an MCV of 95 and normal platelets and WBC count.  Anemia workup showed a ferritin level of 85 with 13% iron saturation, and elevated LDH of 228.  He was recently admitted to [facility] for chest pain and abdominal pain and CBC done there showed some leukopenia of 3.2k hemoglobin of 8 and MCV of 93 with an RDW of 19.1 and platelet count of 286k. B-12 level was 541 with the folate level of 11.6. Cardiac workup was apparently negative. Workup for abdominal pain with an ultrasound of the abdomen showed mild dilatation of the common bile duct with prominence of the pancreatic duct and peripancreatic cystic lesion/fluid collection.   Reports drinking more than 1 pint of hard liquor several times a week. Today he notes he has made a significant effort to cut back. He notes the worse thing about his drinking is that when he starts he cannot stop, and finds himself having to drink first thing in the mornings so he does not feel bad.  He continues on vitamin B12.  Today he notes that he feels "60%" better. When he saw Dr. Pleas Koch he was at "30%".  He is reluctant to do further workup of his anemia because he notes he has always been that way.    MEDICAL HISTORY:  Past  Medical History:  Diagnosis Date  . Colon polyps   . GERD (gastroesophageal reflux disease)   . High cholesterol   . Hypertension   . Sinus infection   Chest pain and recent dobutamine echo done on 07/22/2016 which was negative ejection fraction of 60% Chronic anemia Alcohol abuse  peripheral neuropathy thought to be due to alcohol abuse   SURGICAL HISTORY: Past Surgical History:  Procedure Laterality Date  . COLONOSCOPY N/A 10/26/2013   Procedure: COLONOSCOPY;  Surgeon: Rogene Houston, MD;  Location[facility];  Service: Endoscopy;  Laterality: N/A;  325-moved to 1230 Ann to notify pt  . Colonoscopy with polypectomy      SOCIAL HISTORY: Social History   Social History  . Marital status: Married    Spouse name: N/A  . Number of children: N/A  . Years of education: N/A   Occupational History  . Not on file.   Social History Main Topics  . Smoking status: Former Smoker    Packs/day: 0.50    Years: 20.00  . Smokeless tobacco: Never Used  . Alcohol use Yes     Comment: occasionally  . Drug use: No  . Sexual activity: Not on file   Other Topics Concern  . Not on file   Social History Narrative  . No narrative on file   Alcohol abuse again in excess of 1 pint of  hard liquor several times a week.  FAMILY HISTORY: Family History  Problem Relation Age of Onset  . Colon cancer Neg Hx     ALLERGIES:  is allergic to shrimp [shellfish allergy].  MEDICATIONS:  Current Outpatient Prescriptions  Medication Sig Dispense Refill  . aspirin 81 MG tablet Take 81 mg by mouth daily.    . cholecalciferol (VITAMIN D) 1000 UNITS tablet Take 1,000 Units by mouth 2 (two) times daily.    . folic acid (FOLVITE) 1 MG tablet Take 1 mg by mouth daily.    Marland Kitchen gemfibrozil (LOPID) 600 MG tablet Take 600 mg by mouth 2 (two) times daily before a meal.    . losartan (COZAAR) 50 MG tablet Take 50 mg by mouth daily.    Marland Kitchen omeprazole (PRILOSEC) 20 MG capsule Take 20 mg by mouth daily as  needed (Acid Reflux).    . potassium chloride (K-DUR) 10 MEQ tablet Take 20 mEq by mouth 2 (two) times daily.    . vitamin B-12 (CYANOCOBALAMIN) 1000 MCG tablet Take 1,000 mcg by mouth daily.     No current facility-administered medications for this visit.     REVIEW OF SYSTEMS:   Review of Systems  Constitutional: Negative.   HENT: Negative.   Eyes: Negative.   Respiratory: Negative.   Cardiovascular: Negative.   Gastrointestinal: Negative.   Genitourinary: Negative.   Musculoskeletal: Negative.   Skin: Negative.   Neurological: Negative.   Endo/Heme/Allergies: Negative.   Psychiatric/Behavioral: Negative.   All other systems reviewed and are negative. ,skpros   PHYSICAL EXAMINATION: ECOG PERFORMANCE STATUS: 2 - Symptomatic, <50% confined to bed  . Vitals:   09/09/16 1405  BP: 131/68  Pulse: 79  Resp: 16  Temp: 98.8 F (37.1 C)   Filed Weights   09/09/16 1405  Weight: 174 lb 9.6 oz (79.2 kg)   .Body mass index is 29.05 kg/m.  GENERAL:alert, in no acute distress and comfortable SKIN: skin color, texture, turgor are normal, no rashes or significant lesions EYES: normal, conjunctiva are pink and non-injected, sclera clear OROPHARYNX:no exudate, no erythema and lips, buccal mucosa, and tongue normal  NECK: supple, no JVD, thyroid normal size, non-tender, without nodularity LYMPH:  no palpable lymphadenopathy in the cervical, axillary or inguinal LUNGS: clear to auscultation with normal respiratory effort HEART: regular rate & rhythm,  no murmurs and no lower extremity edema ABDOMEN: abdomen soft, non-tender, normoactive bowel sounds , No palpable hepatosplenomegaly. Musculoskeletal: No pedal edema. PSYCH: alert & oriented x 3 with fluent speech NEURO: no focal motor/sensory deficits  LABORATORY DATA:  I have reviewed the data as listed Results for Roger, Hansen (MRN [id number]) as of 09/10/2016 09:10  Ref. Range 08/13/2016 09:43 08/13/2016 09:44  Iron Latest  Ref Range: 45 - 182 ug/dL 37 (L)   UIBC Latest Units: ug/dL 264   TIBC Latest Ref Range: 250 - 450 ug/dL 301   Saturation Ratios Latest Ref Range: 17.9 - 39.5 % 12 (L)   Ferritin Latest Ref Range: 24 - 336 ng/mL 311   Folate, Hemolysate Latest Ref Range: Not Estab. ng/mL  413.7  Folate, RBC Latest Ref Range: >498 ng/mL  1,483  Vitamin B12 Latest Ref Range: 180 - 914 pg/mL 495    Results for Roger, Hansen (MRN [id number])   Ref. Range 08/13/2016 09:43  Total Protein ELP Latest Ref Range: 6.0 - 8.5 g/dL 6.6  Albumin SerPl Elph-Mcnc Latest Ref Range: 2.9 - 4.4 g/dL 3.7  Albumin/Glob SerPl Latest Ref Range: 0.7 -  1.7  1.3  Alpha2 Glob SerPl Elph-Mcnc Latest Ref Range: 0.4 - 1.0 g/dL 0.6  Alpha 1 Latest Ref Range: 0.0 - 0.4 g/dL 0.2  Gamma Glob SerPl Elph-Mcnc Latest Ref Range: 0.4 - 1.8 g/dL 1.0  IFE 1 Unknown Comment  Globulin, Total Latest Ref Range: 2.2 - 3.9 g/dL 2.9  B-Globulin SerPl Elph-Mcnc Latest Ref Range: 0.7 - 1.3 g/dL 1.1  IgG (Immunoglobin G), Serum Latest Ref Range: 700 - 1,600 mg/dL 1,038  IgA Latest Ref Range: 61 - 437 mg/dL 327  IgM, Serum Latest Ref Range: 20 - 172 mg/dL 78  Kappa free light chain Latest Ref Range: 3.3 - 19.4 mg/L 66.3 (H)  Lamda free light chains Latest Ref Range: 5.7 - 26.3 mg/L 58.0 (H)  Kappa, lamda light chain ratio Latest Ref Range: 0.26 - 1.65  1.14  M Protein SerPl Elph-Mcnc Latest Ref Range: Not Observed g/dL Not Observed   Results for Roger, Hansen (MRN [id number])   Ref. Range 08/13/2016 09:43  Haptoglobin Latest Ref Range: 34 - 200 mg/dL 93   Results for Roger, Hansen (MRN [id number]) as of 09/10/2016 09:10  Ref. Range 08/13/2016 09:43  WBC Latest Ref Range: 4.0 - 10.5 K/uL 6.7  RBC Latest Ref Range: 4.22 - 5.81 MIL/uL 3.17 (L)  Hemoglobin Latest Ref Range: 13.0 - 17.0 g/dL 9.2 (L)  HCT Latest Ref Range: 39.0 - 52.0 % 28.4 (L)  MCV Latest Ref Range: 78.0 - 100.0 fL 89.6  MCH Latest Ref Range: 26.0 - 34.0 pg 29.0  MCHC Latest Ref  Range: 30.0 - 36.0 g/dL 32.4  RDW Latest Ref Range: 11.5 - 15.5 % 18.0 (H)  Platelets Latest Ref Range: 150 - 400 K/uL 212  Neutrophils Latest Units: % 65  Lymphocytes Latest Units: % 24  Monocytes Relative Latest Units: % 6  Eosinophil Latest Units: % 4  Basophil Latest Units: % 1  NEUT# Latest Ref Range: 1.7 - 7.7 K/uL 4.4  Lymphocyte # Latest Ref Range: 0.7 - 4.0 K/uL 1.6  Monocyte # Latest Ref Range: 0.1 - 1.0 K/uL 0.4  Eosinophils Absolute Latest Ref Range: 0.0 - 0.7 K/uL 0.3  Basophils Absolute Latest Ref Range: 0.0 - 0.1 K/uL 0.0  RBC. Latest Ref Range: 4.22 - 5.81 MIL/uL 3.17 (L)  Retic Ct Pct Latest Ref Range: 0.4 - 3.1 % 1.8  Retic Count, Manual Latest Ref Range: 19.0 - 186.0 K/uL 57.1     COLONOSCOPY PROCEDURE REPORT  PATIENT:  Roger Hansen              MR#:  [id number] Birthdate:  May 23, 1946, 70 y.o., male Endoscopist:  Dr. Rogene Houston, MD Referred By:  Dr. Curlene Labrum, MD  Procedure Date: 10/26/2013  Procedure:   Colonoscopy  Indications:  Patient is 70 year old African American male with history of colonic adenomas who is here for surveillance colonoscopy. His last exam was in June 2011 with removal of multiple polyps which were tubular adenomas and 1 with high-grade dysplasia. Family history is negative for CRC.  Informed Consent:  The procedure and risks were reviewed with the patient and informed consent was obtained.  Medications:  Demerol 50 mg IV Versed 6 mg IV  Description of procedure:  After a digital rectal exam was performed, that colonoscope was advanced from the anus through the rectum and colon to the area of the cecum, ileocecal valve and appendiceal orifice. The cecum was deeply intubated. These structures were well-seen and photographed for the record. From  the level of the cecum and ileocecal valve, the scope was slowly and cautiously withdrawn. The mucosal surfaces were carefully surveyed utilizing scope tip to flexion to  facilitate fold flattening as needed. The scope was pulled down into the rectum where a thorough exam including retroflexion was performed. Terminal ileum was also examined.  Findings:   Prep satisfactory. Small cecal polyp located at the edge of ileocecal valve. It was cold snared. Small polyp at splenic flexure was also cold snared. Both of these polyps are submitted together. Normal rectal mucosa. Small hemorrhoids below the dentate line.   Therapeutic/Diagnostic Maneuvers Performed:  See above  Complications:  None  Cecal Withdrawal Time:  17 minutes  Impression:  Normal mucosa of terminal ileum. Two small polyps were cold snared and submitted together. These were located at cecum and splenic flexure. Small external hemorrhoids.  Recommendations:  Standard instructions given. I will contact patient with biopsy results and further recommendations.  REHMAN,NAJEEB U  10/26/2013 1:09 PM   RADIOGRAPHIC STUDIES: I have personally reviewed the radiological images as listed and agreed with the findings in the report. No results found.  ASSESSMENT & PLAN:  Normocytic normochromic anemia Alcohol Abuse Hx colonic polyps, last colonoscopy 10/2013 Hx iron deficiency Abnormal abdominal imaging, dilated CBD   I reviewed his labs in detail. No obvious cause or explanation for his anemia. Alcohol is toxic to marrow but macrocytosis usually preceeds anemia. There are obviously other ways alcohol use causes anemia ie. Portal htn, variceal bleeding, gastritis (iron deficiency).   I have recommended a BMBX for additional evaluation. He is hesitant. I have discussed in detail the risks/benefits of the procedure. He notes that he is familiar as his wife has a history of NHL and has had a BMBX. He is just not worried about his anemia because "I have been this way for a long time." He agrees to a repeat CBC today. If anemia has worsened or not improved he notes he will contemplate a  BMBX. Plan is for follow-up in 2 to 3 months with repeat CBC.   -patient was strongly counseled on complete alcohol abstinence .  -he notes that [facility]  -Patient recommended to call us if he develops any presyncopal/syncopal symptoms, shortness of breath or significant fatigue that might lead to consideration for PRBC transfusion.  MRI abdomen from 07/25/2016 reviewed. No significant abnormality reported.  Plan is above.   Orders Placed This Encounter  Procedures  . CBC with Differential    Standing Status:   Future    Number of Occurrences:   1    Standing Expiration Date:   09/09/2017  . CBC with Differential    Standing Status:   Future    Standing Expiration Date:   09/09/2017   All of the patients questions were answered with apparent satisfaction. The patient knows to call the clinic with any problems, questions or concerns.  Kelby Fam. Alisa Stjames, MD 09/10/2016 9:09 AM

## 2016-09-09 ENCOUNTER — Encounter (HOSPITAL_COMMUNITY): Payer: Medicare Other

## 2016-09-09 ENCOUNTER — Encounter (HOSPITAL_COMMUNITY): Payer: Self-pay | Admitting: Hematology & Oncology

## 2016-09-09 ENCOUNTER — Encounter (HOSPITAL_COMMUNITY): Payer: Medicare Other | Attending: Hematology | Admitting: Hematology & Oncology

## 2016-09-09 VITALS — BP 131/68 | HR 79 | Temp 98.8°F | Resp 16 | Wt 174.6 lb

## 2016-09-09 DIAGNOSIS — D649 Anemia, unspecified: Secondary | ICD-10-CM | POA: Insufficient documentation

## 2016-09-09 DIAGNOSIS — F1010 Alcohol abuse, uncomplicated: Secondary | ICD-10-CM

## 2016-09-09 DIAGNOSIS — K838 Other specified diseases of biliary tract: Secondary | ICD-10-CM | POA: Diagnosis not present

## 2016-09-09 DIAGNOSIS — D72819 Decreased white blood cell count, unspecified: Secondary | ICD-10-CM | POA: Insufficient documentation

## 2016-09-09 LAB — CBC WITH DIFFERENTIAL/PLATELET
Basophils Absolute: 0 10*3/uL (ref 0.0–0.1)
Basophils Relative: 1 %
Eosinophils Absolute: 0.1 10*3/uL (ref 0.0–0.7)
Eosinophils Relative: 2 %
HCT: 27 % — ABNORMAL LOW (ref 39.0–52.0)
Hemoglobin: 8.5 g/dL — ABNORMAL LOW (ref 13.0–17.0)
Lymphocytes Relative: 33 %
Lymphs Abs: 1.3 10*3/uL (ref 0.7–4.0)
MCH: 29.1 pg (ref 26.0–34.0)
MCHC: 31.5 g/dL (ref 30.0–36.0)
MCV: 92.5 fL (ref 78.0–100.0)
Monocytes Absolute: 0.7 10*3/uL (ref 0.1–1.0)
Monocytes Relative: 17 %
Neutro Abs: 2 10*3/uL (ref 1.7–7.7)
Neutrophils Relative %: 47 %
Platelets: 261 10*3/uL (ref 150–400)
RBC: 2.92 MIL/uL — ABNORMAL LOW (ref 4.22–5.81)
RDW: 18.8 % — ABNORMAL HIGH (ref 11.5–15.5)
WBC: 4.1 10*3/uL (ref 4.0–10.5)

## 2016-09-09 NOTE — Patient Instructions (Addendum)
[place] at [facility] Discharge Instructions  RECOMMENDATIONS MADE BY THE CONSULTANT AND ANY TEST RESULTS WILL BE SENT TO YOUR REFERRING PHYSICIAN.  You saw Dr. Whitney Muse today. Labs today. Follow up in 3 months with lab work.  Thank you for choosing [place] at [facility] to provide your oncology and hematology care.  To afford each patient quality time with our provider, please arrive at least 15 minutes before your scheduled appointment time.   Beginning January 23rd 2017 lab work for the [facility] will be done in the  Main lab at [facility] on 1st floor. If you have a lab appointment with the [facility] please come in thru the  Main Entrance and check in at the main information desk  You need to re-schedule your appointment should you arrive 10 or more minutes late.  We strive to give you quality time with our providers, and arriving late affects you and other patients whose appointments are after yours.  Also, if you no show three or more times for appointments you may be dismissed from the clinic at the providers discretion.     Again, thank you for choosing [facility].  Our hope is that these requests will decrease the amount of time that you wait before being seen by our physicians.       _____________________________________________________________  Should you have questions after your visit to [facility], please contact our office at [phone] between the hours of 8:30 a.m. and 4:30 p.m.  Voicemails left after 4:30 p.m. will not be returned until the following business day.  For prescription refill requests, have your pharmacy contact our office.         Resources For Cancer Patients and their Caregivers ? American Cancer Society: Can assist with transportation, wigs, general needs, runs Look Good Feel Better.        ([phone] ? Cancer Care: Provides financial assistance, online  support groups, medication/co-pay assistance.  1-800-813-HOPE 820-517-7168) ? [facility] Assists [place] cancer patients and their families through emotional , educational and financial support.  618-098-0512 ? [place] DSS Where to apply for food stamps, Medicaid and utility assistance. (905) 639-5675 ? [facility]: Transportation to medical appointments. 712 769 0951 ? Social Security Administration: May apply for disability if have a Stage IV cancer. 647-371-4631 (509) 079-4095 ? [employer], Disability and Transit Services: Assists with nutrition, care and transit needs. [place] Support Programs: @10RELATIVEDAYS @ > Cancer Support Group  2nd Tuesday of the month 1pm-2pm, Journey Room  > Creative Journey  3rd Tuesday of the month 1130am-1pm, Journey Room  > Look Good Feel Better  1st Wednesday of the month 10am-12 noon, Journey Room (Call [place] to register [phone])

## 2016-09-11 ENCOUNTER — Other Ambulatory Visit (HOSPITAL_COMMUNITY): Payer: Self-pay | Admitting: *Deleted

## 2016-09-11 DIAGNOSIS — D649 Anemia, unspecified: Secondary | ICD-10-CM

## 2016-09-17 ENCOUNTER — Other Ambulatory Visit (HOSPITAL_COMMUNITY): Payer: Self-pay

## 2016-09-17 DIAGNOSIS — D649 Anemia, unspecified: Secondary | ICD-10-CM

## 2016-09-18 ENCOUNTER — Other Ambulatory Visit (HOSPITAL_COMMUNITY): Payer: Managed Care, Other (non HMO)

## 2016-09-26 DIAGNOSIS — H40013 Open angle with borderline findings, low risk, bilateral: Secondary | ICD-10-CM | POA: Diagnosis not present

## 2016-09-26 DIAGNOSIS — H25013 Cortical age-related cataract, bilateral: Secondary | ICD-10-CM | POA: Diagnosis not present

## 2016-09-26 DIAGNOSIS — H04123 Dry eye syndrome of bilateral lacrimal glands: Secondary | ICD-10-CM | POA: Diagnosis not present

## 2016-09-26 DIAGNOSIS — H16223 Keratoconjunctivitis sicca, not specified as Sjogren's, bilateral: Secondary | ICD-10-CM | POA: Diagnosis not present

## 2016-09-26 DIAGNOSIS — H18413 Arcus senilis, bilateral: Secondary | ICD-10-CM | POA: Diagnosis not present

## 2016-09-26 DIAGNOSIS — H11823 Conjunctivochalasis, bilateral: Secondary | ICD-10-CM | POA: Diagnosis not present

## 2016-09-26 DIAGNOSIS — H35031 Hypertensive retinopathy, right eye: Secondary | ICD-10-CM | POA: Diagnosis not present

## 2016-09-26 DIAGNOSIS — H11153 Pinguecula, bilateral: Secondary | ICD-10-CM | POA: Diagnosis not present

## 2016-09-26 DIAGNOSIS — H35032 Hypertensive retinopathy, left eye: Secondary | ICD-10-CM | POA: Diagnosis not present

## 2016-09-26 DIAGNOSIS — I10 Essential (primary) hypertension: Secondary | ICD-10-CM | POA: Diagnosis not present

## 2016-09-26 DIAGNOSIS — H524 Presbyopia: Secondary | ICD-10-CM | POA: Diagnosis not present

## 2016-09-26 DIAGNOSIS — H1045 Other chronic allergic conjunctivitis: Secondary | ICD-10-CM | POA: Diagnosis not present

## 2016-09-29 ENCOUNTER — Other Ambulatory Visit (HOSPITAL_COMMUNITY): Payer: Self-pay | Admitting: Oncology

## 2016-09-29 DIAGNOSIS — D649 Anemia, unspecified: Secondary | ICD-10-CM

## 2016-09-30 ENCOUNTER — Other Ambulatory Visit (HOSPITAL_COMMUNITY): Payer: Self-pay | Admitting: Hematology & Oncology

## 2016-09-30 DIAGNOSIS — D61818 Other pancytopenia: Secondary | ICD-10-CM

## 2016-10-08 ENCOUNTER — Ambulatory Visit (HOSPITAL_COMMUNITY): Payer: Medicare Other

## 2016-10-15 ENCOUNTER — Other Ambulatory Visit: Payer: Self-pay | Admitting: General Surgery

## 2016-10-16 ENCOUNTER — Ambulatory Visit (HOSPITAL_COMMUNITY)
Admission: RE | Admit: 2016-10-16 | Discharge: 2016-10-16 | Disposition: A | Discharge status: Home / Self Care | Payer: Medicare Other | Source: Ambulatory Visit | Attending: Hematology & Oncology | Admitting: Hematology & Oncology

## 2016-10-16 ENCOUNTER — Ambulatory Visit (HOSPITAL_COMMUNITY)
Admission: RE | Admit: 2016-10-16 | Discharge: 2016-10-16 | Disposition: A | Discharge status: Home / Self Care | Payer: Medicare Other | Source: Ambulatory Visit | Attending: Oncology | Admitting: Oncology

## 2016-10-16 ENCOUNTER — Encounter (HOSPITAL_COMMUNITY): Payer: Self-pay

## 2016-10-16 DIAGNOSIS — D7589 Other specified diseases of blood and blood-forming organs: Secondary | ICD-10-CM | POA: Diagnosis not present

## 2016-10-16 DIAGNOSIS — D539 Nutritional anemia, unspecified: Secondary | ICD-10-CM | POA: Diagnosis not present

## 2016-10-16 DIAGNOSIS — Z87891 Personal history of nicotine dependence: Secondary | ICD-10-CM | POA: Insufficient documentation

## 2016-10-16 DIAGNOSIS — D6489 Other specified anemias: Secondary | ICD-10-CM | POA: Diagnosis not present

## 2016-10-16 DIAGNOSIS — D649 Anemia, unspecified: Secondary | ICD-10-CM | POA: Diagnosis not present

## 2016-10-16 LAB — CBC
HCT: 23.9 % — ABNORMAL LOW (ref 39.0–52.0)
Hemoglobin: 8.1 g/dL — ABNORMAL LOW (ref 13.0–17.0)
MCH: 30.5 pg (ref 26.0–34.0)
MCHC: 33.9 g/dL (ref 30.0–36.0)
MCV: 89.8 fL (ref 78.0–100.0)
Platelets: 289 10*3/uL (ref 150–400)
RBC: 2.66 MIL/uL — ABNORMAL LOW (ref 4.22–5.81)
RDW: 16.6 % — ABNORMAL HIGH (ref 11.5–15.5)
WBC: 2.8 10*3/uL — ABNORMAL LOW (ref 4.0–10.5)

## 2016-10-16 LAB — PROTIME-INR
INR: 0.98
Prothrombin Time: 13 seconds (ref 11.4–15.2)

## 2016-10-16 LAB — APTT: aPTT: 31 seconds (ref 24–36)

## 2016-10-16 MED ORDER — MIDAZOLAM HCL 2 MG/2ML IJ SOLN
INTRAMUSCULAR | Status: AC
  Filled 2016-10-16: qty 6

## 2016-10-16 MED ORDER — FENTANYL CITRATE (PF) 100 MCG/2ML IJ SOLN
INTRAMUSCULAR | Status: AC
  Filled 2016-10-16: qty 2

## 2016-10-16 MED ORDER — FENTANYL CITRATE (PF) 100 MCG/2ML IJ SOLN
INTRAMUSCULAR | Status: AC | PRN
  Administered 2016-10-16: 25 ug via INTRAVENOUS
  Administered 2016-10-16: 50 ug via INTRAVENOUS

## 2016-10-16 MED ORDER — MIDAZOLAM HCL 2 MG/2ML IJ SOLN
INTRAMUSCULAR | Status: AC | PRN
  Administered 2016-10-16 (×4): 1 mg via INTRAVENOUS

## 2016-10-16 MED ORDER — SODIUM CHLORIDE 0.9 % IV SOLN
INTRAVENOUS | Status: DC
  Administered 2016-10-16: 10:00:00 via INTRAVENOUS

## 2016-10-16 NOTE — Discharge Instructions (Signed)
Bone Marrow Aspiration and Bone Marrow Biopsy, Care After °Refer to this sheet in the next few weeks. These instructions provide you with information about caring for yourself after your procedure. Your health care provider may also give you more specific instructions. Your treatment has been planned according to current medical practices, but problems sometimes occur. Call your health care provider if you have any problems or questions after your procedure. °WHAT TO EXPECT AFTER THE PROCEDURE °After your procedure, it is common to have: °· Soreness or tenderness around the puncture site. °· Bruising. °HOME CARE INSTRUCTIONS °· Take medicines only as directed by your health care provider. °· Follow your health care provider's instructions about: °¨ Puncture site care. °¨ Bandage (dressing) changes and removal. °· Bathe and shower as directed by your health care provider. °· Check your puncture site every day for signs of infection. Watch for: °¨ Redness, swelling, or pain. °¨ Fluid, blood, or pus. °· Return to your normal activities as directed by your health care provider. °· Keep all follow-up visits as directed by your health care provider. This is important. °SEEK MEDICAL CARE IF: °· You have a fever. °· You have uncontrollable bleeding. °· You have redness, swelling, or pain at the site of your puncture. °· You have fluid, blood, or pus coming from your puncture site. °  °This information is not intended to replace advice given to you by your health care provider. Make sure you discuss any questions you have with your health care provider. °  °Document Released: 06/20/2005 Document Revised: 04/17/2015 Document Reviewed: 11/22/2014 °Elsevier Interactive Patient Education ©2016 Elsevier Inc. °Moderate Conscious Sedation, Adult, Care After °Refer to this sheet in the next few weeks. These instructions provide you with information on caring for yourself after your procedure. Your health care provider may also give  you more specific instructions. Your treatment has been planned according to current medical practices, but problems sometimes occur. Call your health care provider if you have any problems or questions after your procedure. °WHAT TO EXPECT AFTER THE PROCEDURE  °After your procedure: °· You may feel sleepy, clumsy, and have poor balance for several hours. °· Vomiting may occur if you eat too soon after the procedure. °HOME CARE INSTRUCTIONS °· Do not participate in any activities where you could become injured for at least 24 hours. Do not: °¨ Drive. °¨ Swim. °¨ Ride a bicycle. °¨ Operate heavy machinery. °¨ Cook. °¨ Use power tools. °¨ Climb ladders. °¨ Work from a high place. °· Do not make important decisions or sign legal documents until you are improved. °· If you vomit, drink water, juice, or soup when you can drink without vomiting. Make sure you have little or no nausea before eating solid foods. °· Only take over-the-counter or prescription medicines for pain, discomfort, or fever as directed by your health care provider. °· Make sure you and your family fully understand everything about the medicines given to you, including what side effects may occur. °· You should not drink alcohol, take sleeping pills, or take medicines that cause drowsiness for at least 24 hours. °· If you smoke, do not smoke without supervision. °· If you are feeling better, you may resume normal activities 24 hours after you were sedated. °· Keep all appointments with your health care provider. °SEEK MEDICAL CARE IF: °· Your skin is pale or bluish in color. °· You continue to feel nauseous or vomit. °· Your pain is getting worse and is not helped by medicine. °·   You have bleeding or swelling. °· You are still sleepy or feeling clumsy after 24 hours. °SEEK IMMEDIATE MEDICAL CARE IF: °· You develop a rash. °· You have difficulty breathing. °· You develop any type of allergic problem. °· You have a fever. °MAKE SURE YOU: °· Understand  these instructions. °· Will watch your condition. °· Will get help right away if you are not doing well or get worse. °  °This information is not intended to replace advice given to you by your health care provider. Make sure you discuss any questions you have with your health care provider. °  °Document Released: 09/21/2013 Document Revised: 12/22/2014 Document Reviewed: 09/21/2013 °Elsevier Interactive Patient Education ©2016 Elsevier Inc. ° ° °

## 2016-10-16 NOTE — H&P (Signed)
Chief Complaint: anemia  Referring Physician:Dr. Ancil Linsey  Supervising Physician: Daryll Brod  Patient Status: [facility] - Out-pt  HPI: Roger Hansen is an 70 y.o. male who has a history of anemia for at least the last 5 years.  He has recently been noted to have progressive anemia.  He was even admitted for chest pain and some abdominal pain at [facility] recently and work up was negative except for persistent anemia.  He has been referred to Dr. Whitney Muse who has suggested a bone marrow biopsy to rule out any other causes of his anemia.  He presents today for this procedure.  He has no complaints and states he feels fine.  Past Medical History:  Past Medical History:  Diagnosis Date  . Colon polyps   . GERD (gastroesophageal reflux disease)   . High cholesterol   . Hypertension   . Sinus infection     Past Surgical History:  Past Surgical History:  Procedure Laterality Date  . COLONOSCOPY N/A 10/26/2013   Procedure: COLONOSCOPY;  Surgeon: Rogene Houston, MD;  Location: [facility];  Service: Endoscopy;  Laterality: N/A;  325-moved to 1230 Ann to notify pt  . Colonoscopy with polypectomy      Family History:  Family History  Problem Relation Age of Onset  . Colon cancer Neg Hx     Social History:  reports that he has quit smoking. He has a 10.00 pack-year smoking history. He has never used smokeless tobacco. He reports that he drinks alcohol. He reports that he does not use drugs.  Allergies:  Allergies  Allergen Reactions  . Shrimp [Shellfish Allergy]     Medications: Medications reviewed in Epic  Please HPI for pertinent positives, otherwise complete 10 system ROS negative.  Mallampati Score: MD Evaluation Airway: WNL Heart: WNL Abdomen: WNL Chest/ Lungs: WNL ASA  Classification: 2 Mallampati/Airway Score: One  Physical Exam: BP (!) 154/84 (BP Location: Right Arm)   Pulse 64   Temp 97.9 F (36.6 C) (Oral)   Resp 18   SpO2 100%  There  is no height or weight on file to calculate BMI. General: pleasant, WD, WN black male who is laying in bed in NAD HEENT: head is normocephalic, atraumatic.  Sclera are noninjected.  PERRL.  Ears and nose without any masses or lesions.  Mouth is pink and moist Heart: regular, rate, and rhythm.  Normal s1,s2. No obvious murmurs, gallops, or rubs noted.  Palpable radial and pedal pulses bilaterally Lungs: CTAB, no wheezes, rhonchi, or rales noted.  Respiratory effort nonlabored Abd: soft, NT, ND, +BS, no masses, hernias, or organomegaly Psych: A&Ox3 with an appropriate affect.   Labs: Pending  Imaging: No results found.  Assessment/Plan 1. Progressive anemia -we will plan to proceed with a BMBX today for further evaluation -labs are pending.  Vitals have been reviewed -Risks and Benefits discussed with the patient including, but not limited to bleeding, infection, damage to adjacent structures or low yield requiring additional tests. All of the patient's questions were answered, patient is agreeable to proceed. Consent signed and in chart.  Thank you for this interesting consult.  I greatly enjoyed meeting Roger Hansen and look forward to participating in their care.  A copy of this report was sent to the requesting provider on this date.  Electronically Signed: Henreitta Cea 10/16/2016, 9:29 AM   I spent a total of  30 Minutes   in face to face in clinical consultation, greater than  50% of which was counseling/coordinating care for anemia

## 2016-10-16 NOTE — Procedures (Signed)
Chronic anemia  S/p CT RT ILIAC BM ASP AND CORE BX  No comp Stable Full report in PACS Path pending

## 2016-10-21 ENCOUNTER — Encounter (HOSPITAL_COMMUNITY): Payer: Self-pay | Admitting: Hematology & Oncology

## 2016-10-21 ENCOUNTER — Encounter (HOSPITAL_COMMUNITY): Payer: Medicare Other | Attending: Hematology | Admitting: Hematology & Oncology

## 2016-10-21 VITALS — BP 156/80 | HR 76 | Temp 98.5°F | Resp 18 | Wt 173.0 lb

## 2016-10-21 DIAGNOSIS — Z86010 Personal history of colonic polyps: Secondary | ICD-10-CM | POA: Diagnosis not present

## 2016-10-21 DIAGNOSIS — F1010 Alcohol abuse, uncomplicated: Secondary | ICD-10-CM

## 2016-10-21 DIAGNOSIS — D649 Anemia, unspecified: Secondary | ICD-10-CM | POA: Diagnosis present

## 2016-10-21 DIAGNOSIS — D72819 Decreased white blood cell count, unspecified: Secondary | ICD-10-CM | POA: Insufficient documentation

## 2016-10-21 DIAGNOSIS — D696 Thrombocytopenia, unspecified: Secondary | ICD-10-CM

## 2016-10-21 NOTE — Patient Instructions (Addendum)
[place] at [facility] Discharge Instructions  RECOMMENDATIONS MADE BY THE CONSULTANT AND ANY TEST RESULTS WILL BE SENT TO YOUR REFERRING PHYSICIAN.  Exam and discussion today with Dr. Whitney Muse. Return in 6 weeks as scheduled for office visit with Kirby Crigler, PA-C. Lab work as scheduled every 2 weeks. Referral to GI (stomach) doctor - their office will call you with an appointment date/time.    Thank you for choosing [place] at [facility] to provide your oncology and hematology care.  To afford each patient quality time with our provider, please arrive at least 15 minutes before your scheduled appointment time.   Beginning January 23rd 2017 lab work for the [facility] will be done in the  Main lab at [facility] on 1st floor. If you have a lab appointment with the [facility] please come in thru the  Main Entrance and check in at the main information desk  You need to re-schedule your appointment should you arrive 10 or more minutes late.  We strive to give you quality time with our providers, and arriving late affects you and other patients whose appointments are after yours.  Also, if you no show three or more times for appointments you may be dismissed from the clinic at the providers discretion.     Again, thank you for choosing [facility].  Our hope is that these requests will decrease the amount of time that you wait before being seen by our physicians.       _____________________________________________________________  Should you have questions after your visit to [facility], please contact our office at [phone] between the hours of 8:30 a.m. and 4:30 p.m.  Voicemails left after 4:30 p.m. will not be returned until the following business day.  For prescription refill requests, have your pharmacy contact our office.         Resources For Cancer Patients and their Caregivers ? American  Cancer Society: Can assist with transportation, wigs, general needs, runs Look Good Feel Better.        ([phone] ? Cancer Care: Provides financial assistance, online support groups, medication/co-pay assistance.  1-800-813-HOPE 2311978909) ? [place] Assists [place] cancer patients and their families through emotional , educational and financial support.  ([phone] [phone] ? [place] DSS Where to apply for food stamps, Medicaid and utility assistance. 760-112-9579 ? RCATS: Transportation to medical appointments. 201-222-2479 ? Social Security Administration: May apply for disability if have a Stage IV cancer. [phone] [phone] ? [employer], Disability and Transit Services: Assists with nutrition, care and transit needs. [place] Support Programs: @10RELATIVEDAYS @ > Cancer Support Group  2nd Tuesday of the month 1pm-2pm, Journey Room  > Creative Journey  3rd Tuesday of the month 1130am-1pm, Journey Room  > Look Good Feel Better  1st Wednesday of the month 10am-12 noon, Journey Room (Call [place] to register [phone])

## 2016-10-21 NOTE — Progress Notes (Signed)
HEMATOLOGY/ONCOLOGY PROGRESS NOTE  Date of Service: .09/09/2016  Patient Care Team: Curlene Labrum, MD as PCP - General  CHIEF COMPLAINTS/PURPOSE OF CONSULTATION:  Anemia Alcohol Abuse History of colonic adenomas, tubular adenomas 1 with high grade dysplasia History of IV iron replacement  HISTORY OF PRESENTING ILLNESS:  Roger Hansen is a wonderful 70 y.o. male who has been referred to [place] by Dr .Curlene Labrum, MD for evaluation and management of Anemia. He returns to [place] for ongoing follow-up.   Patient is accompanied by his wife. He reports a normal appetite, but says he is experiencing depression and difficulty sleeping. Dr. Pleas Koch is aware. He notes that depression is chronic. He is followed at the [place].   Mr. Bosarge last had EGD in 2014. He denies any blood in his stool. No dark or tarry stool.   Patient says he has not consumed alcohol in about two weeks. Patient says he started drinking heavily in 2007.  He has had a flu shot. Presents today to review recent BMBX. He continues to take a multi complex B vitamin.   MEDICAL HISTORY:  Past Medical History:  Diagnosis Date  . Colon polyps   . GERD (gastroesophageal reflux disease)   . High cholesterol   . Hypertension   . Sinus infection   Chest pain and recent dobutamine echo done on 07/22/2016 which was negative ejection fraction of 60% Chronic anemia Alcohol abuse  peripheral neuropathy thought to be due to alcohol abuse   SURGICAL HISTORY: Past Surgical History:  Procedure Laterality Date  . COLONOSCOPY N/A 10/26/2013   Procedure: COLONOSCOPY;  Surgeon: Rogene Houston, MD;  [facility];  Service: Endoscopy;  Laterality: N/A;  325-moved to 1230 Ann to notify pt  . Colonoscopy with polypectomy      SOCIAL HISTORY: Social History   Social History  . Marital status: Married    Spouse name: N/A  . Number of children: N/A  . Years of education: N/A   Occupational History  . Not on file.    Social History Main Topics  . Smoking status: Former Smoker    Packs/day: 0.50    Years: 20.00  . Smokeless tobacco: Never Used  . Alcohol use Yes     Comment: occasionally  . Drug use: No  . Sexual activity: Not on file   Other Topics Concern  . Not on file   Social History Narrative  . No narrative on file   Alcohol abuse again in excess of 1 pint of hard liquor several times a week.  FAMILY HISTORY: Family History  Problem Relation Age of Onset  . 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  MEDICATIONS:  Current Outpatient Prescriptions  Medication Sig Dispense Refill  . aspirin 81 MG tablet Take 81 mg by mouth daily.    . cholecalciferol (VITAMIN D) 1000 UNITS tablet Take 1,000 Units by mouth 2 (two) times daily.    . folic acid (FOLVITE) 1 MG tablet Take 1 mg by mouth daily.    Marland Kitchen gabapentin (NEURONTIN) 300 MG capsule Take 300 mg by mouth 3 (three) times daily as needed.    Marland Kitchen gemfibrozil (LOPID) 600 MG tablet Take 600 mg by mouth 2 (two) times daily before a meal.    . losartan (COZAAR) 50 MG tablet Take 50 mg by mouth daily.    Marland Kitchen omeprazole (PRILOSEC) 20 MG capsule Take 20 mg by mouth daily as needed (  Acid Reflux).    . potassium chloride (K-DUR) 10 MEQ tablet Take 20 mEq by mouth 2 (two) times daily.    . vitamin B-12 (CYANOCOBALAMIN) 1000 MCG tablet Take 1,000 mcg by mouth daily.     No current facility-administered medications for this visit.     REVIEW OF SYSTEMS:   Review of Systems  Constitutional: Negative.   HENT: Negative.   Eyes: Negative.   Respiratory: Negative.   Cardiovascular: Negative.   Gastrointestinal: Negative.   Genitourinary: Negative.   Musculoskeletal: Negative.   Skin: Negative.   Neurological: Negative.   Endo/Heme/Allergies: Negative.   Psychiatric/Behavioral: Positive for depression. The patient has insomnia.   All other systems reviewed and are negative. 14 point review of  systems was performed and is negative except as detailed under history of present illness and above  PHYSICAL EXAMINATION: ECOG PERFORMANCE STATUS: 1 - Symptomatic but completely ambulatory  . Vitals:   10/21/16 0952  BP: (!) 156/80  Pulse: 76  Resp: 18  Temp: 98.5 F (36.9 C)   Filed Weights   10/21/16 0952  Weight: 173 lb (78.5 kg)   .Body mass index is 28.79 kg/m.  GENERAL:alert, in no acute distress and comfortable SKIN: skin color, texture, turgor are normal, no rashes or significant lesions EYES: normal, conjunctiva are pink and non-injected, sclera clear OROPHARYNX:no exudate, no erythema and lips, buccal mucosa, and tongue normal  NECK: supple, no JVD, thyroid normal size, non-tender, without nodularity LYMPH:  no palpable lymphadenopathy in the cervical, axillary or inguinal LUNGS: clear to auscultation with normal respiratory effort HEART: regular rate & rhythm,  no murmurs and no lower extremity edema ABDOMEN: abdomen soft, non-tender, normoactive bowel sounds , No palpable hepatosplenomegaly. Musculoskeletal: No pedal edema. PSYCH: alert & oriented x 3 with fluent speech NEURO: no focal motor/sensory deficits  LABORATORY DATA:  I have reviewed the data as listedResults for YORIEL, CRISPELL (MRN [id number]) as of 10/21/2016 10:17  Ref. Range 10/16/2016 09:37  WBC Latest Ref Range: 4.0 - 10.5 K/uL 2.8 (L)  RBC Latest Ref Range: 4.22 - 5.81 MIL/uL 2.66 (L)  Hemoglobin Latest Ref Range: 13.0 - 17.0 g/dL 8.1 (L)  HCT Latest Ref Range: 39.0 - 52.0 % 23.9 (L)  MCV Latest Ref Range: 78.0 - 100.0 fL 89.8  MCH Latest Ref Range: 26.0 - 34.0 pg 30.5  MCHC Latest Ref Range: 30.0 - 36.0 g/dL 33.9  RDW Latest Ref Range: 11.5 - 15.5 % 16.6 (H)  Platelets Latest Ref Range: 150 - 400 K/uL 289  Prothrombin Time Latest Ref Range: 11.4 - 15.2 seconds 13.0  INR Unknown 0.98  APTT Latest Ref Range: 24 - 36 seconds 31    COLONOSCOPY PROCEDURE REPORT  PATIENT:  HUGHEY STREHLE               MR#:  [id number] Birthdate:  17-May-1946, 70 y.o., male Endoscopist:  Dr. Rogene Houston, MD Referred By:  Dr. Curlene Labrum, MD  Procedure Date: 10/26/2013  Procedure:   Colonoscopy  Indications:  Patient is 70 year old African American male with history of colonic adenomas who is here for surveillance colonoscopy. His last exam was in June 2011 with removal of multiple polyps which were tubular adenomas and 1 with high-grade dysplasia. Family history is negative for CRC.  Informed Consent:  The procedure and risks were reviewed with the patient and informed consent was obtained.  Medications:  Demerol 50 mg IV Versed 6 mg IV  Description of procedure:  After a  digital rectal exam was performed, that colonoscope was advanced from the anus through the rectum and colon to the area of the cecum, ileocecal valve and appendiceal orifice. The cecum was deeply intubated. These structures were well-seen and photographed for the record. From the level of the cecum and ileocecal valve, the scope was slowly and cautiously withdrawn. The mucosal surfaces were carefully surveyed utilizing scope tip to flexion to facilitate fold flattening as needed. The scope was pulled down into the rectum where a thorough exam including retroflexion was performed. Terminal ileum was also examined.  Findings:   Prep satisfactory. Small cecal polyp located at the edge of ileocecal valve. It was cold snared. Small polyp at splenic flexure was also cold snared. Both of these polyps are submitted together. Normal rectal mucosa. Small hemorrhoids below the dentate line.   Therapeutic/Diagnostic Maneuvers Performed:  See above  Complications:  None  Cecal Withdrawal Time:  17 minutes  Impression:  Normal mucosa of terminal ileum. Two small polyps were cold snared and submitted together. These were located at cecum and splenic flexure. Small external hemorrhoids.  Recommendations:   Standard instructions given. I will contact patient with biopsy results and further recommendations.  REHMAN,NAJEEB U  10/26/2013 1:09 PM   RADIOGRAPHIC STUDIES: I have personally reviewed the radiological images as listed and agreed with the findings in the report. Ct Biopsy  Result Date: 10/16/2016 INDICATION: Chronic and progressive anemia, unspecified EXAM: CT GUIDED RIGHT ILIAC BONE MARROW ASPIRATION AND CORE BIOPSY Date:  11/2/201711/01/2016 11:57 am Radiologist:  M. Daryll Brod, MD Guidance:  CT FLUOROSCOPY TIME:  Fluoroscopy Time: NONE. MEDICATIONS: 1% lidocaine locally ANESTHESIA/SEDATION: 4.0 mg IV Versed; 75 mcg IV Fentanyl Moderate Sedation Time:  11 minutes The patient was continuously monitored during the procedure by the interventional radiology nurse under my direct supervision. CONTRAST:  None. COMPLICATIONS: None PROCEDURE: Informed consent was obtained from the patient following explanation of the procedure, risks, benefits and alternatives. The patient understands, agrees and consents for the procedure. All questions were addressed. A time out was performed. The patient was positioned prone and non-contrast localization CT was performed of the pelvis to demonstrate the iliac marrow spaces. Maximal barrier sterile technique utilized including caps, mask, sterile gowns, sterile gloves, large sterile drape, hand hygiene, and Betadine prep. Under sterile conditions and local anesthesia, an 11 gauge coaxial bone biopsy needle was advanced into the right iliac marrow space. Needle position was confirmed with CT imaging. Initially, bone marrow aspiration was performed. Next, the 11 gauge outer cannula was utilized to obtain a right iliac bone marrow core biopsy. Needle was removed. Hemostasis was obtained with compression. The patient tolerated the procedure well. Samples were prepared with the cytotechnologist. No immediate complications. IMPRESSION: CT guided right iliac bone marrow  aspiration and core biopsy. Electronically Signed   By: Jerilynn Mages.  Shick M.D.   On: 10/16/2016 12:13   PATHOLOGY:    ASSESSMENT & PLAN:  Normocytic normochromic anemia Alcohol Abuse Hx colonic polyps, last colonoscopy 10/2013 History of colonic adenomas, tubular adenomas 1 with high grade dysplasia Hx iron deficiency Abnormal abdominal imaging, dilated CBD   I reviewed his labs in detail. No obvious cause or explanation for his anemia. Alcohol is toxic to marrow but macrocytosis usually preceeds anemia. There are obviously other ways alcohol use causes anemia ie. Portal htn, variceal bleeding, gastritis (iron deficiency). BMBX was reviewed with the patient in detail, no obvious cause for anemia based upon his marrow, alcohol effect still possible. Cytogenetics still pending. He  notes that he hasn't drank in 2 weeks.   -patient was strongly counseled on complete alcohol abstinence .  -he notes that [facility]  -Patient recommended to call us if he develops any presyncopal/syncopal symptoms, shortness of breath or significant fatigue that might lead to consideration for PRBC transfusion.  MRI abdomen from 07/25/2016 reviewed. No significant abnormality reported.  Will continue to monitor CBC's, for now I have him on a standing schedule of Q 2 weeks.  I have referred him back to GI for repeat EGD/Colonoscopy if indicated.   Plan is above.   Orders Placed This Encounter  Procedures  . CBC with Differential    Standing Status:   Standing    Number of Occurrences:   6    Standing Expiration Date:   10/21/2017    All of the patients questions were answered with apparent satisfaction. The patient knows to call the clinic with any problems, questions or concerns.  This document serves as a record of services personally performed by Ancil Linsey, MD. It was created on her behalf by Elmyra Ricks, a trained medical scribe. The creation of this record is based on the  scribe's personal observations and the provider's statements to them. This document has been checked and approved by the attending provider.  I have reviewed the above documentation for accuracy and completeness and I agree with the above.  Kelby Fam. Bird Tailor, MD 10/21/2016 10:34 AM

## 2016-10-24 LAB — CHROMOSOME ANALYSIS, BONE MARROW

## 2016-10-29 ENCOUNTER — Encounter (HOSPITAL_COMMUNITY): Payer: Self-pay

## 2016-10-30 ENCOUNTER — Telehealth (HOSPITAL_COMMUNITY): Payer: Self-pay | Admitting: *Deleted

## 2016-10-30 NOTE — Telephone Encounter (Signed)
Patient called back by myself.

## 2016-11-03 ENCOUNTER — Other Ambulatory Visit (HOSPITAL_COMMUNITY): Payer: Self-pay | Admitting: *Deleted

## 2016-11-03 DIAGNOSIS — D649 Anemia, unspecified: Secondary | ICD-10-CM

## 2016-11-03 LAB — BONE MARROW EXAM

## 2016-11-04 ENCOUNTER — Encounter (HOSPITAL_COMMUNITY): Payer: Medicare Other

## 2016-11-04 ENCOUNTER — Encounter (INDEPENDENT_AMBULATORY_CARE_PROVIDER_SITE_OTHER): Payer: Self-pay | Admitting: *Deleted

## 2016-11-04 DIAGNOSIS — D649 Anemia, unspecified: Secondary | ICD-10-CM | POA: Diagnosis not present

## 2016-11-04 DIAGNOSIS — D72819 Decreased white blood cell count, unspecified: Secondary | ICD-10-CM | POA: Diagnosis not present

## 2016-11-04 LAB — CBC WITH DIFFERENTIAL/PLATELET
Basophils Absolute: 0.1 10*3/uL (ref 0.0–0.1)
Basophils Relative: 1 %
Eosinophils Absolute: 0.1 10*3/uL (ref 0.0–0.7)
Eosinophils Relative: 3 %
HCT: 28.8 % — ABNORMAL LOW (ref 39.0–52.0)
Hemoglobin: 9.2 g/dL — ABNORMAL LOW (ref 13.0–17.0)
Lymphocytes Relative: 42 %
Lymphs Abs: 2 10*3/uL (ref 0.7–4.0)
MCH: 29.3 pg (ref 26.0–34.0)
MCHC: 31.9 g/dL (ref 30.0–36.0)
MCV: 91.7 fL (ref 78.0–100.0)
Monocytes Absolute: 0.4 10*3/uL (ref 0.1–1.0)
Monocytes Relative: 8 %
Neutro Abs: 2.2 10*3/uL (ref 1.7–7.7)
Neutrophils Relative %: 46 %
Platelets: 335 10*3/uL (ref 150–400)
RBC: 3.14 MIL/uL — ABNORMAL LOW (ref 4.22–5.81)
RDW: 15.7 % — ABNORMAL HIGH (ref 11.5–15.5)
WBC: 4.7 10*3/uL (ref 4.0–10.5)

## 2016-11-04 LAB — SAMPLE TO BLOOD BANK

## 2016-11-18 ENCOUNTER — Encounter (HOSPITAL_COMMUNITY): Payer: Medicare Other | Attending: Hematology & Oncology

## 2016-11-18 DIAGNOSIS — Z87891 Personal history of nicotine dependence: Secondary | ICD-10-CM | POA: Diagnosis not present

## 2016-11-18 DIAGNOSIS — I129 Hypertensive chronic kidney disease with stage 1 through stage 4 chronic kidney disease, or unspecified chronic kidney disease: Secondary | ICD-10-CM | POA: Diagnosis not present

## 2016-11-18 DIAGNOSIS — F1020 Alcohol dependence, uncomplicated: Secondary | ICD-10-CM | POA: Diagnosis not present

## 2016-11-18 DIAGNOSIS — N183 Chronic kidney disease, stage 3 (moderate): Secondary | ICD-10-CM | POA: Insufficient documentation

## 2016-11-18 DIAGNOSIS — D649 Anemia, unspecified: Secondary | ICD-10-CM | POA: Diagnosis not present

## 2016-11-18 LAB — CBC WITH DIFFERENTIAL/PLATELET
Basophils Absolute: 0 10*3/uL (ref 0.0–0.1)
Basophils Relative: 1 %
Eosinophils Absolute: 0.1 10*3/uL (ref 0.0–0.7)
Eosinophils Relative: 2 %
HCT: 30.8 % — ABNORMAL LOW (ref 39.0–52.0)
Hemoglobin: 9.9 g/dL — ABNORMAL LOW (ref 13.0–17.0)
Lymphocytes Relative: 32 %
Lymphs Abs: 1.4 10*3/uL (ref 0.7–4.0)
MCH: 28.6 pg (ref 26.0–34.0)
MCHC: 32.1 g/dL (ref 30.0–36.0)
MCV: 89 fL (ref 78.0–100.0)
Monocytes Absolute: 0.3 10*3/uL (ref 0.1–1.0)
Monocytes Relative: 7 %
Neutro Abs: 2.6 10*3/uL (ref 1.7–7.7)
Neutrophils Relative %: 58 %
Platelets: 239 10*3/uL (ref 150–400)
RBC: 3.46 MIL/uL — ABNORMAL LOW (ref 4.22–5.81)
RDW: 15.4 % (ref 11.5–15.5)
WBC: 4.4 10*3/uL (ref 4.0–10.5)

## 2016-11-24 ENCOUNTER — Ambulatory Visit (INDEPENDENT_AMBULATORY_CARE_PROVIDER_SITE_OTHER): Payer: Medicare Other | Admitting: Internal Medicine

## 2016-11-24 ENCOUNTER — Encounter (INDEPENDENT_AMBULATORY_CARE_PROVIDER_SITE_OTHER): Payer: Self-pay | Admitting: Internal Medicine

## 2016-11-24 VITALS — BP 150/80 | HR 60 | Temp 98.0°F | Ht 65.5 in | Wt 175.5 lb

## 2016-11-24 DIAGNOSIS — F1010 Alcohol abuse, uncomplicated: Secondary | ICD-10-CM | POA: Insufficient documentation

## 2016-11-24 DIAGNOSIS — D649 Anemia, unspecified: Secondary | ICD-10-CM | POA: Diagnosis not present

## 2016-11-24 HISTORY — DX: Alcohol abuse, uncomplicated: F10.10

## 2016-11-24 LAB — CBC
HCT: 29.7 % — ABNORMAL LOW (ref 38.5–50.0)
Hemoglobin: 9.5 g/dL — ABNORMAL LOW (ref 13.2–17.1)
MCH: 28.7 pg (ref 27.0–33.0)
MCHC: 32 g/dL (ref 32.0–36.0)
MCV: 89.7 fL (ref 80.0–100.0)
MPV: 9.2 fL (ref 7.5–12.5)
Platelets: 220 K/uL (ref 140–400)
RBC: 3.31 MIL/uL — ABNORMAL LOW (ref 4.20–5.80)
RDW: 16 % — ABNORMAL HIGH (ref 11.0–15.0)
WBC: 3.9 K/uL (ref 3.8–10.8)

## 2016-11-24 NOTE — Patient Instructions (Signed)
Stool cards x 3  CBC. OV in 3 months.

## 2016-11-24 NOTE — Progress Notes (Signed)
Subjective:    Patient ID: Roger Hansen, male    DOB: [birth year], 71 y.o.   MRN: [id number]  HPI Referred by Dr. Whitney Muse for anemia. See Dr. Whitney Muse for anemia. Patient denies seeing any black stool or BRRB. He occasionally has epigastric pain after he drinks etoh. His appetite is good. There has been no weight loss. He says he stays around [place].  No hematemesis.   Drinks one pint of alcohol a day.  10/17/2016 Bone marrow aspirate, biopsy: revealed normocytic normochromic anemia. Leukopenia.    Iron/TIBC/Ferritin/ %Sat    Component Value Date/Time   IRON 37 (L) 08/13/2016 0943   TIBC 301 08/13/2016 0943   FERRITIN 311 08/13/2016 0943   IRONPCTSAT 12 (L) 08/13/2016 0943      CBC Latest Ref Rng & Units 11/18/2016 11/04/2016 10/16/2016  WBC 4.0 - 10.5 K/uL 4.4 4.7 2.8(L)  Hemoglobin 13.0 - 17.0 g/dL 9.9(L) 9.2(L) 8.1(L)  Hematocrit 39.0 - 52.0 % 30.8(L) 28.8(L) 23.9(L)  Platelets 150 - 400 K/uL 239 335 289   10/11/2013 Hemoglobin 10.5,  Hepatic Function Panel     Component Value Date/Time   PROT 7.3 08/13/2016 0943   ALBUMIN 3.9 08/13/2016 0943   AST 29 08/13/2016 0943   ALT 15 (L) 08/13/2016 0943   ALKPHOS 72 08/13/2016 0943   BILITOT 0.9 08/13/2016 0943       10/26/2013 Colonoscopy: Dr. Laural Golden:   Indications:  Patient is 70 year old African American male with history of colonic adenomas who is here for surveillance colonoscopy. His last exam was in June 2011 with removal of multiple polyps which were tubular adenomas and 1 with high-grade dysplasia. Family history is negative for CRC.   Patient had 2 polyps removed and these are tubular adenomas. He had multiple adenomas removed on prior colonoscopy as well. Next colonoscopy in 5 years.   Review of Systems Past Medical History:  Diagnosis Date  . Alcohol abuse 11/24/2016  . Colon polyps   . GERD (gastroesophageal reflux disease)   . High cholesterol   . Hypertension   . Sinus infection     Past  Surgical History:  Procedure Laterality Date  . COLONOSCOPY N/A 10/26/2013   Procedure: COLONOSCOPY;  Surgeon: Rogene Houston, MD;  [facility];  Service: Endoscopy;  Laterality: N/A;  325-moved to 1230 Ann to notify pt  . Colonoscopy with polypectomy      Allergies  Allergen Reactions  . 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 aspirin 81 MG tablet Take 81 mg by mouth daily.    . cholecalciferol (VITAMIN D) 1000 UNITS tablet Take 1,000 Units by mouth 2 (two) times daily.    . folic acid (FOLVITE) 1 MG tablet Take 1 mg by mouth daily.    Marland Kitchen gabapentin (NEURONTIN) 300 MG capsule Take 300 mg by mouth 3 (three) times daily as needed.    Marland Kitchen gemfibrozil (LOPID) 600 MG tablet Take 600 mg by mouth 2 (two) times daily before a meal.    . losartan (COZAAR) 50 MG tablet Take 50 mg by mouth daily.    Marland Kitchen omeprazole (PRILOSEC) 20 MG capsule Take 20 mg by mouth daily as needed (Acid Reflux).    . potassium chloride (K-DUR) 10 MEQ tablet Take 20 mEq by mouth 2 (two) times daily.    . vitamin B-12 (CYANOCOBALAMIN) 1000 MCG tablet Take 1,000 mcg by mouth daily.  No current facility-administered medications on file prior to visit.        Objective:   Physical Exam Blood pressure (!) 150/80, pulse 60, temperature 98 F (36.7 C), height 5' 5.5" (1.664 m), weight 175 lb 8 oz (79.6 kg). Alert and oriented. Skin warm and dry. Oral mucosa is moist.   . Sclera anicteric, conjunctivae is pink. Thyroid not enlarged. No cervical lymphadenopathy. Lungs clear. Heart regular rate and rhythm.  Abdomen is soft. Bowel sounds are positive. No hepatomegaly. No abdominal masses felt. No tenderness.  No edema to lower extremities.  Stool brown and guaiac negative.       Assessment & Plan:  Normocytic anemia. Stool negative for blood. CBC today and 3 stool cards home with patient. Will discuss with Dr.  Laural Golden.

## 2016-12-02 ENCOUNTER — Encounter (HOSPITAL_COMMUNITY): Payer: Self-pay | Admitting: Oncology

## 2016-12-02 ENCOUNTER — Encounter (HOSPITAL_BASED_OUTPATIENT_CLINIC_OR_DEPARTMENT_OTHER): Payer: Medicare Other | Admitting: Oncology

## 2016-12-02 ENCOUNTER — Encounter (HOSPITAL_COMMUNITY): Payer: Medicare Other

## 2016-12-02 VITALS — BP 136/79 | HR 78 | Temp 98.2°F | Resp 16 | Ht 65.5 in | Wt 176.0 lb

## 2016-12-02 DIAGNOSIS — N183 Chronic kidney disease, stage 3 unspecified: Secondary | ICD-10-CM

## 2016-12-02 DIAGNOSIS — D649 Anemia, unspecified: Secondary | ICD-10-CM

## 2016-12-02 DIAGNOSIS — F1010 Alcohol abuse, uncomplicated: Secondary | ICD-10-CM | POA: Diagnosis not present

## 2016-12-02 HISTORY — DX: Chronic kidney disease, stage 3 unspecified: N18.30

## 2016-12-02 LAB — COMPREHENSIVE METABOLIC PANEL
ALT: 10 U/L — ABNORMAL LOW (ref 17–63)
AST: 19 U/L (ref 15–41)
Albumin: 3.9 g/dL (ref 3.5–5.0)
Alkaline Phosphatase: 68 U/L (ref 38–126)
Anion gap: 7 (ref 5–15)
BUN: 10 mg/dL (ref 6–20)
CO2: 30 mmol/L (ref 22–32)
Calcium: 9.5 mg/dL (ref 8.9–10.3)
Chloride: 100 mmol/L — ABNORMAL LOW (ref 101–111)
Creatinine, Ser: 0.8 mg/dL (ref 0.61–1.24)
GFR calc Af Amer: >60 mL/min (ref >60)
GFR calc non Af Amer: >60 mL/min (ref >60)
Glucose, Bld: 105 mg/dL — ABNORMAL HIGH (ref 65–99)
Potassium: 3.8 mmol/L (ref 3.5–5.1)
Sodium: 137 mmol/L (ref 135–145)
Total Bilirubin: 1.3 mg/dL — ABNORMAL HIGH (ref 0.3–1.2)
Total Protein: 7 g/dL (ref 6.5–8.1)

## 2016-12-02 LAB — CBC WITH DIFFERENTIAL/PLATELET
Basophils Absolute: 0 10*3/uL (ref 0.0–0.1)
Basophils Relative: 0 %
Eosinophils Absolute: 0.1 10*3/uL (ref 0.0–0.7)
Eosinophils Relative: 1 %
HCT: 29.7 % — ABNORMAL LOW (ref 39.0–52.0)
Hemoglobin: 9.6 g/dL — ABNORMAL LOW (ref 13.0–17.0)
Lymphocytes Relative: 23 %
Lymphs Abs: 1.6 10*3/uL (ref 0.7–4.0)
MCH: 29.6 pg (ref 26.0–34.0)
MCHC: 32.3 g/dL (ref 30.0–36.0)
MCV: 91.7 fL (ref 78.0–100.0)
Monocytes Absolute: 0.5 10*3/uL (ref 0.1–1.0)
Monocytes Relative: 7 %
Neutro Abs: 4.7 10*3/uL (ref 1.7–7.7)
Neutrophils Relative %: 69 %
Platelets: 195 10*3/uL (ref 150–400)
RBC: 3.24 MIL/uL — ABNORMAL LOW (ref 4.22–5.81)
RDW: 16.6 % — ABNORMAL HIGH (ref 11.5–15.5)
WBC: 6.8 10*3/uL (ref 4.0–10.5)

## 2016-12-02 NOTE — Assessment & Plan Note (Addendum)
normocytic, normochromic anemia dating back to at least 2014 with negative peripheral work-up, negative bone marrow aspiration and biopsy on 10/16/2016 in the setting of EtOHism.  Labs today: CBC diff, CMET, EPO.  I personally reviewed and went over laboratory results with the patient.  The results are noted within this dictation.  In have reviewed his chart in detail in addition to past blood work.  He has seen Deberah Castle, NP (GI) for consideration of EGD/Colonoscopy for anemia work-up.  Last colonoscopy was in 2014.  EtOH probably is a contributing factor to his anemia given its toxicity on the bone marrow.  We discussed AA and I spent a lot of time reviewing the benefits of AA.  He is encouraged to reach out to the organization to help with EtOHism.  He reports drinking 1-2 times per week, consuming 1/5th of Vodka each night, HOWEVER, he reported to the triaging nurse that if he does not drink everyday, he cannot sleep.  Continue with labs every 2 weeks.    Return in 4-6 weeks for follow-up.  If labs are stable at time of return, then decreasing frequency of lab checks and follow-up appointments would be reasonable.

## 2016-12-02 NOTE — Patient Instructions (Signed)
[place] at [facility] Discharge Instructions  RECOMMENDATIONS MADE BY THE CONSULTANT AND ANY TEST RESULTS WILL BE SENT TO YOUR REFERRING PHYSICIAN.  You were seen today by Kirby Crigler PA-C. Return every 2 weeks for labs. Return in 6 weeks for follow up.   Thank you for choosing [place] at [facility] to provide your oncology and hematology care.  To afford each patient quality time with our provider, please arrive at least 15 minutes before your scheduled appointment time.   Beginning January 23rd 2017 lab work for the [facility] will be done in the  Main lab at [facility] on 1st floor. If you have a lab appointment with the [place] please come in thru the  Main Entrance and check in at the main information desk  You need to re-schedule your appointment should you arrive 10 or more minutes late.  We strive to give you quality time with our providers, and arriving late affects you and other patients whose appointments are after yours.  Also, if you no show three or more times for appointments you may be dismissed from the clinic at the providers discretion.     Again, thank you for choosing [facility].  Our hope is that these requests will decrease the amount of time that you wait before being seen by our physicians.       _____________________________________________________________  Should you have questions after your visit to [facility], please contact our office at [phone] between the hours of 8:30 a.m. and 4:30 p.m.  Voicemails left after 4:30 p.m. will not be returned until the following business day.  For prescription refill requests, have your pharmacy contact our office.         Resources For Cancer Patients and their Caregivers ? American Cancer Society: Can assist with transportation, wigs, general needs, runs Look Good Feel Better.        579-036-3572 ? Cancer Care: Provides  financial assistance, online support groups, medication/co-pay assistance.  1-800-813-HOPE (410)034-1396) ? [place] Assists [place] cancer patients and their families through emotional , educational and financial support.  308-581-4919 ? [place] DSS Where to apply for food stamps, Medicaid and utility assistance. (906)076-3991 ? RCATS: Transportation to medical appointments. 5518194496 ? Social Security Administration: May apply for disability if have a Stage IV cancer. 228-375-9621 539-288-8464 ? [employer], Disability and Transit Services: Assists with nutrition, care and transit needs. [place] Support Programs: @10RELATIVEDAYS @ > Cancer Support Group  2nd Tuesday of the month 1pm-2pm, Journey Room  > Creative Journey  3rd Tuesday of the month 1130am-1pm, Journey Room  > Look Good Feel Better  1st Wednesday of the month 10am-12 noon, Journey Room (Call [place] to register [phone])

## 2016-12-02 NOTE — Progress Notes (Signed)
Roger Labrum, MD [address] [place] [address]  Anemia, unspecified type - Plan: Comprehensive metabolic panel, Erythropoietin  CURRENT THERAPY: Work-up/observation  INTERVAL HISTORY: Roger Hansen 70 y.o. male returns for followup of normocytic, normochromic anemia dating back to at least 2014 with negative peripheral work-up, negative bone marrow aspiration and biopsy on 10/16/2016, in the setting of ETOHism.  He continues to drink EtOH.  He does not drink daily anymore, but he continues to drink 5th of Vodka 1-2 days per week.   We spent a large portion of time regarding his EtOHism.  He denies any B symptoms.  Review of Systems  Constitutional: Negative.  Negative for chills, fever and weight loss.  HENT: Negative.   Eyes: Negative.   Respiratory: Negative.  Negative for cough.   Cardiovascular: Negative.  Negative for chest pain.  Gastrointestinal: Negative.  Negative for blood in stool and melena.  Genitourinary: Negative.  Negative for hematuria.  Musculoskeletal: Negative.   Skin: Negative.   Neurological: Negative.  Negative for weakness.  Endo/Heme/Allergies: Negative.   Psychiatric/Behavioral: Negative.     Past Medical History:  Diagnosis Date  . Alcohol abuse 11/24/2016  . Anemia 10/11/2013  . Chronic renal disease, stage 3, moderately decreased glomerular filtration rate (GFR) between 30-59 mL/min/1.73 square meter 12/02/2016  . Colon polyps   . GERD (gastroesophageal reflux disease)   . High cholesterol   . Hypertension   . Sinus infection     Past Surgical History:  Procedure Laterality Date  . COLONOSCOPY N/A 10/26/2013   Procedure: COLONOSCOPY;  Surgeon: Rogene Houston, MD;  [facility];  Service: Endoscopy;  Laterality: N/A;  325-moved to 1230 Ann to notify pt  . Colonoscopy with polypectomy      Family History  Problem Relation Age of Onset  . Colon cancer Neg Hx     Social History   Social History  .  Marital status: Married    Spouse name: N/A  . Number of children: N/A  . Years of education: N/A   Social History Main Topics  . Smoking status: Former Smoker    Packs/day: 0.50    Years: 20.00  . Smokeless tobacco: Never Used  . Alcohol use Yes     Comment: 1 pint of etoh a day  . Drug use: No  . Sexual activity: Not Asked   Other Topics Concern  . None   Social History Narrative  . None     PHYSICAL EXAMINATION  ECOG PERFORMANCE STATUS: 1 - Symptomatic but completely ambulatory  Vitals:   12/02/16 0928  BP: 136/79  Pulse: 78  Resp: 16  Temp: 98.2 F (36.8 C)    GENERAL:alert, no distress, well nourished, well developed, comfortable, cooperative, obese, smiling and accompanied by wife. SKIN: skin color, texture, turgor are normal, no rashes or significant lesions HEAD: Normocephalic, No masses, lesions, tenderness or abnormalities EYES: normal, EOMI, Conjunctiva are pink and non-injected EARS: External ears normal OROPHARYNX:lips, buccal mucosa, and tongue normal and mucous membranes are moist  NECK: supple, trachea midline LYMPH:  no palpable lymphadenopathy BREAST:not examined LUNGS: clear to auscultation  HEART: regular rate & rhythm ABDOMEN:abdomen soft and normal bowel sounds BACK: Back symmetric, no curvature. EXTREMITIES:less then 2 second capillary refill, no joint deformities, effusion, or inflammation, no skin discoloration, no clubbing, no cyanosis  NEURO: alert & oriented x 3 with fluent speech, no focal motor/sensory deficits, gait normal   LABORATORY DATA: CBC  Component Value Date/Time   WBC 6.8 12/02/2016 0855   RBC 3.24 (L) 12/02/2016 0855   HGB 9.6 (L) 12/02/2016 0855   HCT 29.7 (L) 12/02/2016 0855   HCT 27.9 (L) 08/13/2016 0944   PLT 195 12/02/2016 0855   MCV 91.7 12/02/2016 0855   MCH 29.6 12/02/2016 0855   MCHC 32.3 12/02/2016 0855   RDW 16.6 (H) 12/02/2016 0855   LYMPHSABS 1.6 12/02/2016 0855   MONOABS 0.5 12/02/2016 0855    EOSABS 0.1 12/02/2016 0855   BASOSABS 0.0 12/02/2016 0855      Chemistry      Component Value Date/Time   NA 137 12/02/2016 0855   K 3.8 12/02/2016 0855   CL 100 (L) 12/02/2016 0855   CO2 30 12/02/2016 0855   BUN 10 12/02/2016 0855   CREATININE 0.80 12/02/2016 0855      Component Value Date/Time   CALCIUM 9.5 12/02/2016 0855   ALKPHOS 68 12/02/2016 0855   AST 19 12/02/2016 0855   ALT 10 (L) 12/02/2016 0855   BILITOT 1.3 (H) 12/02/2016 0855        PENDING LABS:   RADIOGRAPHIC STUDIES:  No results found.   PATHOLOGY:    ASSESSMENT AND PLAN:  Anemia normocytic, normochromic anemia dating back to at least 2014 with negative peripheral work-up, negative bone marrow aspiration and biopsy on 10/16/2016 in the setting of EtOHism.  Labs today: CBC diff, CMET, EPO.  I personally reviewed and went over laboratory results with the patient.  The results are noted within this dictation.  In have reviewed his chart in detail in addition to past blood work.  He has seen Deberah Castle, NP (GI) for consideration of EGD/Colonoscopy for anemia work-up.  Last colonoscopy was in 2014.  EtOH probably is a contributing factor to his anemia given its toxicity on the bone marrow.  We discussed AA and I spent a lot of time reviewing the benefits of AA.  He is encouraged to reach out to the organization to help with EtOHism.  He reports drinking 1-2 times per week, consuming 1/5th of Vodka each night, HOWEVER, he reported to the triaging nurse that if he does not drink everyday, he cannot sleep.  Continue with labs every 2 weeks.    Return in 4-6 weeks for follow-up.  If labs are stable at time of return, then decreasing frequency of lab checks and follow-up appointments would be reasonable.   ORDERS PLACED FOR THIS ENCOUNTER: Orders Placed This Encounter  Procedures  . Comprehensive metabolic panel  . Erythropoietin    MEDICATIONS PRESCRIBED THIS ENCOUNTER: No orders of the defined  types were placed in this encounter.   THERAPY PLAN:  Ongoing observation.  All questions were answered. The patient knows to call the clinic with any problems, questions or concerns. We can certainly see the patient much sooner if necessary.  Patient and plan discussed with Dr. Ancil Linsey and she is in agreement with the aforementioned.   This note is electronically signed by: Doy Mince 12/02/2016 10:11 AM

## 2016-12-03 LAB — ERYTHROPOIETIN: Erythropoietin: 23.6 m[IU]/mL — ABNORMAL HIGH (ref 2.6–18.5)

## 2016-12-12 ENCOUNTER — Ambulatory Visit (HOSPITAL_COMMUNITY): Payer: Managed Care, Other (non HMO) | Admitting: Hematology & Oncology

## 2016-12-12 ENCOUNTER — Other Ambulatory Visit (HOSPITAL_COMMUNITY): Payer: Managed Care, Other (non HMO)

## 2016-12-16 ENCOUNTER — Encounter (HOSPITAL_COMMUNITY): Payer: Medicare Other | Attending: Hematology & Oncology

## 2016-12-16 DIAGNOSIS — Z87891 Personal history of nicotine dependence: Secondary | ICD-10-CM | POA: Insufficient documentation

## 2016-12-16 DIAGNOSIS — F1020 Alcohol dependence, uncomplicated: Secondary | ICD-10-CM | POA: Diagnosis not present

## 2016-12-16 DIAGNOSIS — D649 Anemia, unspecified: Secondary | ICD-10-CM | POA: Diagnosis present

## 2016-12-16 DIAGNOSIS — I129 Hypertensive chronic kidney disease with stage 1 through stage 4 chronic kidney disease, or unspecified chronic kidney disease: Secondary | ICD-10-CM | POA: Insufficient documentation

## 2016-12-16 DIAGNOSIS — N183 Chronic kidney disease, stage 3 (moderate): Secondary | ICD-10-CM | POA: Insufficient documentation

## 2016-12-16 LAB — CBC WITH DIFFERENTIAL/PLATELET
Basophils Absolute: 0 10*3/uL (ref 0.0–0.1)
Basophils Relative: 1 %
Eosinophils Absolute: 0.1 10*3/uL (ref 0.0–0.7)
Eosinophils Relative: 1 %
HCT: 29.4 % — ABNORMAL LOW (ref 39.0–52.0)
Hemoglobin: 9.7 g/dL — ABNORMAL LOW (ref 13.0–17.0)
Lymphocytes Relative: 45 %
Lymphs Abs: 1.9 10*3/uL (ref 0.7–4.0)
MCH: 31.2 pg (ref 26.0–34.0)
MCHC: 33 g/dL (ref 30.0–36.0)
MCV: 94.5 fL (ref 78.0–100.0)
Monocytes Absolute: 0.6 10*3/uL (ref 0.1–1.0)
Monocytes Relative: 13 %
Neutro Abs: 1.7 10*3/uL (ref 1.7–7.7)
Neutrophils Relative %: 40 %
Platelets: 213 10*3/uL (ref 150–400)
RBC: 3.11 MIL/uL — ABNORMAL LOW (ref 4.22–5.81)
RDW: 19 % — ABNORMAL HIGH (ref 11.5–15.5)
WBC: 4.3 10*3/uL (ref 4.0–10.5)

## 2016-12-30 ENCOUNTER — Other Ambulatory Visit (HOSPITAL_COMMUNITY): Payer: Medicare Other

## 2016-12-31 ENCOUNTER — Other Ambulatory Visit (HOSPITAL_COMMUNITY): Payer: Medicare Other

## 2017-01-05 ENCOUNTER — Encounter (HOSPITAL_COMMUNITY): Payer: Medicare Other

## 2017-01-05 DIAGNOSIS — D649 Anemia, unspecified: Secondary | ICD-10-CM | POA: Diagnosis not present

## 2017-01-05 LAB — CBC WITH DIFFERENTIAL/PLATELET
Basophils Absolute: 0.1 10*3/uL (ref 0.0–0.1)
Basophils Relative: 1 %
Eosinophils Absolute: 0.2 10*3/uL (ref 0.0–0.7)
Eosinophils Relative: 4 %
HCT: 30.7 % — ABNORMAL LOW (ref 39.0–52.0)
Hemoglobin: 10 g/dL — ABNORMAL LOW (ref 13.0–17.0)
Lymphocytes Relative: 33 %
Lymphs Abs: 1.6 10*3/uL (ref 0.7–4.0)
MCH: 30 pg (ref 26.0–34.0)
MCHC: 32.6 g/dL (ref 30.0–36.0)
MCV: 92.2 fL (ref 78.0–100.0)
Monocytes Absolute: 0.7 10*3/uL (ref 0.1–1.0)
Monocytes Relative: 14 %
Neutro Abs: 2.3 10*3/uL (ref 1.7–7.7)
Neutrophils Relative %: 48 %
Platelets: 364 10*3/uL (ref 150–400)
RBC: 3.33 MIL/uL — ABNORMAL LOW (ref 4.22–5.81)
RDW: 16.9 % — ABNORMAL HIGH (ref 11.5–15.5)
WBC: 4.8 10*3/uL (ref 4.0–10.5)

## 2017-01-13 ENCOUNTER — Ambulatory Visit (HOSPITAL_COMMUNITY): Payer: Medicare Other | Admitting: Hematology & Oncology

## 2017-01-13 ENCOUNTER — Other Ambulatory Visit (HOSPITAL_COMMUNITY): Payer: Medicare Other

## 2017-01-21 ENCOUNTER — Other Ambulatory Visit (HOSPITAL_COMMUNITY): Payer: Medicare Other

## 2017-01-21 ENCOUNTER — Ambulatory Visit (HOSPITAL_COMMUNITY): Payer: Medicare Other | Admitting: Oncology

## 2017-02-02 ENCOUNTER — Ambulatory Visit (HOSPITAL_COMMUNITY): Payer: Medicare Other | Admitting: Hematology & Oncology

## 2017-02-10 ENCOUNTER — Encounter (HOSPITAL_COMMUNITY): Payer: Self-pay | Admitting: Adult Health

## 2017-02-10 ENCOUNTER — Other Ambulatory Visit (HOSPITAL_COMMUNITY): Payer: Medicare Other

## 2017-02-10 ENCOUNTER — Ambulatory Visit (HOSPITAL_COMMUNITY): Payer: Medicare Other | Admitting: Oncology

## 2017-02-10 NOTE — Progress Notes (Signed)
Cytogenetic laboratory report received and reviewed from [facility].  Date received: 10/16/16  Cytogenetic analysis: Normal: Revealed presence of normal male chromosomes with no observable clonal chromosomal abnormalities.    Copy of report sent to HIM to be scanned into patient's record.  Mike Craze, NP [place] [phone]

## 2017-02-11 ENCOUNTER — Encounter (HOSPITAL_COMMUNITY): Payer: Self-pay | Admitting: Oncology

## 2017-02-11 ENCOUNTER — Encounter (HOSPITAL_COMMUNITY): Payer: Medicare Other | Attending: Oncology | Admitting: Oncology

## 2017-02-11 ENCOUNTER — Encounter (HOSPITAL_COMMUNITY): Payer: Medicare Other

## 2017-02-11 VITALS — BP 138/75 | HR 84 | Temp 98.2°F | Resp 16 | Ht 65.0 in | Wt 168.0 lb

## 2017-02-11 DIAGNOSIS — D649 Anemia, unspecified: Secondary | ICD-10-CM

## 2017-02-11 DIAGNOSIS — D509 Iron deficiency anemia, unspecified: Secondary | ICD-10-CM | POA: Diagnosis present

## 2017-02-11 DIAGNOSIS — Z79899 Other long term (current) drug therapy: Secondary | ICD-10-CM | POA: Insufficient documentation

## 2017-02-11 DIAGNOSIS — F1020 Alcohol dependence, uncomplicated: Secondary | ICD-10-CM | POA: Diagnosis not present

## 2017-02-11 DIAGNOSIS — Z87891 Personal history of nicotine dependence: Secondary | ICD-10-CM | POA: Insufficient documentation

## 2017-02-11 DIAGNOSIS — Z7982 Long term (current) use of aspirin: Secondary | ICD-10-CM | POA: Insufficient documentation

## 2017-02-11 DIAGNOSIS — D6489 Other specified anemias: Secondary | ICD-10-CM | POA: Insufficient documentation

## 2017-02-11 LAB — CBC WITH DIFFERENTIAL/PLATELET
Basophils Absolute: 0 10*3/uL (ref 0.0–0.1)
Basophils Relative: 0 %
Eosinophils Absolute: 0.1 10*3/uL (ref 0.0–0.7)
Eosinophils Relative: 2 %
HCT: 26.7 % — ABNORMAL LOW (ref 39.0–52.0)
Hemoglobin: 9.1 g/dL — ABNORMAL LOW (ref 13.0–17.0)
Lymphocytes Relative: 35 %
Lymphs Abs: 1.9 10*3/uL (ref 0.7–4.0)
MCH: 31.5 pg (ref 26.0–34.0)
MCHC: 34.1 g/dL (ref 30.0–36.0)
MCV: 92.4 fL (ref 78.0–100.0)
Monocytes Absolute: 0.5 10*3/uL (ref 0.1–1.0)
Monocytes Relative: 9 %
Neutro Abs: 2.8 10*3/uL (ref 1.7–7.7)
Neutrophils Relative %: 54 %
Platelets: 125 10*3/uL — ABNORMAL LOW (ref 150–400)
RBC: 2.89 MIL/uL — ABNORMAL LOW (ref 4.22–5.81)
RDW: 17.8 % — ABNORMAL HIGH (ref 11.5–15.5)
WBC: 5.3 10*3/uL (ref 4.0–10.5)

## 2017-02-11 NOTE — Progress Notes (Signed)
Curlene Labrum, MD [place] [address]  Anemia, unspecified type - Plan: Hepatitis panel, acute, CBC with Differential, CBC with Differential  CURRENT THERAPY: Observation  INTERVAL HISTORY: Roger Hansen 71 y.o. male returns for followup of normocytic, normochromic anemia dating back to at least 2014 with negative peripheral work-up, negative bone marrow aspiration and biopsy and cytogenetics on 10/16/2016, in the setting of ETOHism.  He reports he is doing better with alcohol cessation but he admits to ongoing alcoholism.  He is much more nonspecific today since he is unaccompanied regarding his alcohol abuse.  What I was able to ascertain is that he continues to abuse vodka 1-3 times per week.  He cannot recall how many bottles of vodka a months by per week.  On his last encounter with me, he admitted to drinking 1/5 of vodka nearly on a daily basis.  He reports today that he continues to drink beer as well.  He notes that he has 1 beer in his fridge today and he thinks he can make it last for a few more days.  He does not anticipate drinking it today.  Unfortunately, his daughter has been placed in jail.  As result he and his wife are parenting his grandchildren.  He reports his grandchildren are toddler's and are full of energy.  He notes that his wife is struggling with regards to keeping up with her children.  He reports that he did get back involved with Alcoholics Anonymous until his grandchildren moved in with him.  He notes that there is none times a day for him to participate in Alcoholics Anonymous at this time.  He hopes to get back involved in the future.  He denies any B symptoms.  His weight is stable.  His appetite is stable.  He denies any unintentional weight loss.  He denies any hematemesis.  He denies any blood in his stool.  Review of Systems  Constitutional: Negative.  Negative for chills, fever and weight loss.  HENT: Negative.   Eyes:  Negative.   Respiratory: Negative.  Negative for cough and hemoptysis.   Cardiovascular: Negative.  Negative for chest pain.  Gastrointestinal: Negative.  Negative for blood in stool and melena.  Genitourinary: Negative.  Negative for hematuria.  Musculoskeletal: Negative.   Skin: Negative.   Neurological: Negative.  Negative for weakness.  Endo/Heme/Allergies: Negative.   Psychiatric/Behavioral: Negative.     Past Medical History:  Diagnosis Date  . Alcohol abuse 11/24/2016  . Anemia 10/11/2013  . Chronic renal disease, stage 3, moderately decreased glomerular filtration rate (GFR) between 30-59 mL/min/1.73 square meter 12/02/2016  . Colon polyps   . GERD (gastroesophageal reflux disease)   . High cholesterol   . Hypertension   . Sinus infection     Past Surgical History:  Procedure Laterality Date  . COLONOSCOPY N/A 10/26/2013   Procedure: COLONOSCOPY;  Surgeon: Rogene Houston, MD;  [facility];  Service: Endoscopy;  Laterality: N/A;  325-moved to 1230 Ann to notify pt  . Colonoscopy with polypectomy      Family History  Problem Relation Age of Onset  . Colon cancer Neg Hx     Social History   Social History  . Marital status: Married    Spouse name: N/A  . Number of children: N/A  . Years of education: N/A   Social History Main Topics  . Smoking status: Former Smoker    Packs/day: 0.50  Years: 20.00  . Smokeless tobacco: Never Used  . Alcohol use Yes     Comment: 1 pint of etoh a day  . Drug use: No  . Sexual activity: Not Asked   Other Topics Concern  . None   Social History Narrative  . None     PHYSICAL EXAMINATION  ECOG PERFORMANCE STATUS: 1 - Symptomatic but completely ambulatory  Vitals:   02/11/17 1209  BP: 138/75  Pulse: 84  Resp: 16  Temp: 98.2 F (36.8 C)     GENERAL:alert, no distress, well nourished, well developed, comfortable, cooperative, obese, smiling and unaccompanied.  SKIN: skin color, texture, turgor are  normal, no rashes or significant lesions HEAD: Normocephalic, No masses, lesions, tenderness or abnormalities EYES: normal, EOMI, Conjunctiva are pink and non-injected EARS: External ears normal OROPHARYNX:lips, buccal mucosa, and tongue normal and mucous membranes are moist  NECK: supple, trachea midline LYMPH:  no palpable lymphadenopathy BREAST:not examined LUNGS: Diffuse rhonchi throughout. HEART: regular rate & rhythm without murmur, rub, gallop.  Normal S1 and S2. ABDOMEN:abdomen soft and normal bowel sounds BACK: Back symmetric, no curvature. EXTREMITIES:less then 2 second capillary refill, no joint deformities, effusion, or inflammation, no skin discoloration, no clubbing, no cyanosis  NEURO: alert & oriented x 3 with fluent speech, no focal motor/sensory deficits, gait normal   LABORATORY DATA: CBC    Component Value Date/Time   WBC 5.3 02/11/2017 0959   RBC 2.89 (L) 02/11/2017 0959   HGB 9.1 (L) 02/11/2017 0959   HCT 26.7 (L) 02/11/2017 0959   HCT 27.9 (L) 08/13/2016 0944   PLT 125 (L) 02/11/2017 0959   MCV 92.4 02/11/2017 0959   MCH 31.5 02/11/2017 0959   MCHC 34.1 02/11/2017 0959   RDW 17.8 (H) 02/11/2017 0959   LYMPHSABS 1.9 02/11/2017 0959   MONOABS 0.5 02/11/2017 0959   EOSABS 0.1 02/11/2017 0959   BASOSABS 0.0 02/11/2017 0959      Chemistry      Component Value Date/Time   NA 137 12/02/2016 0855   K 3.8 12/02/2016 0855   CL 100 (L) 12/02/2016 0855   CO2 30 12/02/2016 0855   BUN 10 12/02/2016 0855   CREATININE 0.80 12/02/2016 0855      Component Value Date/Time   CALCIUM 9.5 12/02/2016 0855   ALKPHOS 68 12/02/2016 0855   AST 19 12/02/2016 0855   ALT 10 (L) 12/02/2016 0855   BILITOT 1.3 (H) 12/02/2016 0855        PENDING LABS:   RADIOGRAPHIC STUDIES:  No results found.   PATHOLOGY:    ASSESSMENT AND PLAN:  Anemia Normocytic, normochromic anemia dating back to at least 2014 with negative peripheral work-up, negative bone marrow  aspiration and biopsy and cytogenetics on 10/16/2016 in the setting of EtOHism.  Labs today: CBC diff, hepatitis panel.  I personally reviewed and went over laboratory results with the patient.  The results are noted within this dictation.  He has seen Deberah Castle, NP (GI) for consideration of EGD/Colonoscopy for anemia work-up.  Last colonoscopy was in 2014.  He has follow-up with Terri on 02/23/2017.  EtOH probably is a contributing factor to his anemia given its toxicity on the bone marrow.  We discussed AA and I spent a lot of time reviewing the benefits of AA.  He is encouraged to reach out to the organization to help with EtOHism.  He reports drinking 1-2 times per week, consuming 1/5th of Vodka each night, HOWEVER, he reported to the triaging nurse that  if he does not drink everyday, he cannot sleep.  Labs every 6 weeks: CBC diff.  Return in 4 months for follow-up.  If labs are stable at time of return, then decreasing frequency of lab checks and follow-up appointments would be reasonable.   ORDERS PLACED FOR THIS ENCOUNTER: Orders Placed This Encounter  Procedures  . Hepatitis panel, acute  . CBC with Differential  . CBC with Differential    MEDICATIONS PRESCRIBED THIS ENCOUNTER: Meds ordered this encounter  Medications  . DISCONTD: potassium chloride SA (K-DUR,KLOR-CON) 20 MEQ tablet    THERAPY PLAN:  Ongoing observation.  All questions were answered. The patient knows to call the clinic with any problems, questions or concerns. We can certainly see the patient much sooner if necessary.  Patient and plan discussed with Dr. Twana First and she is in agreement with the aforementioned.   This note is electronically signed by: Doy Mince 02/11/2017 1:31 PM

## 2017-02-11 NOTE — Patient Instructions (Signed)
[facility] Discharge Instructions  RECOMMENDATIONS MADE BY THE CONSULTANT AND ANY TEST RESULTS WILL BE SENT TO YOUR REFERRING PHYSICIAN.  You were seen today by Kirby Crigler PA-C. Return every 6 weeks for labs. Return in 4 months for follow up visit.   Thank you for choosing [facility] at [facility] to provide your oncology and hematology care.  To afford each patient quality time with our provider, please arrive at least 15 minutes before your scheduled appointment time.    If you have a lab appointment with the [facility] please come in thru the  Main Entrance and check in at the main information desk  You need to re-schedule your appointment should you arrive 10 or more minutes late.  We strive to give you quality time with our providers, and arriving late affects you and other patients whose appointments are after yours.  Also, if you no show three or more times for appointments you may be dismissed from the clinic at the providers discretion.     Again, thank you for choosing [facility].  Our hope is that these requests will decrease the amount of time that you wait before being seen by our physicians.       _____________________________________________________________  Should you have questions after your visit to [facility], please contact our office at [phone] between the hours of 8:30 a.m. and 4:30 p.m.  Voicemails left after 4:30 p.m. will not be returned until the following business day.  For prescription refill requests, have your pharmacy contact our office.       Resources For Cancer Patients and their Caregivers ? American Cancer Society: Can assist with transportation, wigs, general needs, runs Look Good Feel Better.        904-154-8050 ? Cancer Care: Provides financial assistance, online support groups, medication/co-pay assistance.  1-800-813-HOPE 626-255-1717) ? [place] Assists [place] cancer patients and their families through emotional , educational and financial support.  747-600-0794 ? [place] DSS Where to apply for food stamps, Medicaid and utility assistance. 337-844-8709 ? [facility]: Transportation to medical appointments. 567-595-6261 ? Social Security Administration: May apply for disability if have a Stage IV cancer. 603-533-7426 7696105544 ? [employer], Disability and Transit Services: Assists with nutrition, care and transit needs. [facility] Support Programs: @10RELATIVEDAYS @ > Cancer Support Group  2nd Tuesday of the month 1pm-2pm, Journey Room  > Creative Journey  3rd Tuesday of the month 1130am-1pm, Journey Room  > Look Good Feel Better  1st Wednesday of the month 10am-12 noon, Journey Room (Call [facility] to register [phone])

## 2017-02-11 NOTE — Assessment & Plan Note (Signed)
Normocytic, normochromic anemia dating back to at least 2014 with negative peripheral work-up, negative bone marrow aspiration and biopsy and cytogenetics on 10/16/2016 in the setting of EtOHism.  Labs today: CBC diff, hepatitis panel.  I personally reviewed and went over laboratory results with the patient.  The results are noted within this dictation.  He has seen Deberah Castle, NP (GI) for consideration of EGD/Colonoscopy for anemia work-up.  Last colonoscopy was in 2014.  He has follow-up with Terri on 02/23/2017.  EtOH probably is a contributing factor to his anemia given its toxicity on the bone marrow.  We discussed AA and I spent a lot of time reviewing the benefits of AA.  He is encouraged to reach out to the organization to help with EtOHism.  He reports drinking 1-2 times per week, consuming 1/5th of Vodka each night, HOWEVER, he reported to the triaging nurse that if he does not drink everyday, he cannot sleep.  Labs every 6 weeks: CBC diff.  Return in 4 months for follow-up.  If labs are stable at time of return, then decreasing frequency of lab checks and follow-up appointments would be reasonable.

## 2017-02-12 LAB — HEPATITIS PANEL, ACUTE
HCV Ab: <0.1 s/co ratio (ref 0.0–0.9)
Hep A IgM: NEGATIVE
Hep B C IgM: NEGATIVE
Hepatitis B Surface Ag: NEGATIVE

## 2017-02-23 ENCOUNTER — Ambulatory Visit (INDEPENDENT_AMBULATORY_CARE_PROVIDER_SITE_OTHER): Payer: Medicare Other | Admitting: Internal Medicine

## 2017-02-24 DIAGNOSIS — G629 Polyneuropathy, unspecified: Secondary | ICD-10-CM | POA: Diagnosis not present

## 2017-02-24 DIAGNOSIS — I10 Essential (primary) hypertension: Secondary | ICD-10-CM | POA: Diagnosis not present

## 2017-02-24 DIAGNOSIS — M79672 Pain in left foot: Secondary | ICD-10-CM | POA: Diagnosis not present

## 2017-02-24 DIAGNOSIS — S82492A Other fracture of shaft of left fibula, initial encounter for closed fracture: Secondary | ICD-10-CM | POA: Diagnosis not present

## 2017-02-24 DIAGNOSIS — S82832A Other fracture of upper and lower end of left fibula, initial encounter for closed fracture: Secondary | ICD-10-CM | POA: Diagnosis not present

## 2017-02-24 DIAGNOSIS — Z87891 Personal history of nicotine dependence: Secondary | ICD-10-CM | POA: Diagnosis not present

## 2017-02-24 DIAGNOSIS — Z79899 Other long term (current) drug therapy: Secondary | ICD-10-CM | POA: Diagnosis not present

## 2017-02-24 DIAGNOSIS — S99922A Unspecified injury of left foot, initial encounter: Secondary | ICD-10-CM | POA: Diagnosis not present

## 2017-02-24 DIAGNOSIS — Z7982 Long term (current) use of aspirin: Secondary | ICD-10-CM | POA: Diagnosis not present

## 2017-02-24 DIAGNOSIS — M25562 Pain in left knee: Secondary | ICD-10-CM | POA: Diagnosis not present

## 2017-02-24 DIAGNOSIS — W010XXA Fall on same level from slipping, tripping and stumbling without subsequent striking against object, initial encounter: Secondary | ICD-10-CM | POA: Diagnosis not present

## 2017-02-24 DIAGNOSIS — S8992XA Unspecified injury of left lower leg, initial encounter: Secondary | ICD-10-CM | POA: Diagnosis not present

## 2017-02-26 ENCOUNTER — Ambulatory Visit (INDEPENDENT_AMBULATORY_CARE_PROVIDER_SITE_OTHER): Payer: Medicare Other | Admitting: Internal Medicine

## 2017-03-02 DIAGNOSIS — M25572 Pain in left ankle and joints of left foot: Secondary | ICD-10-CM | POA: Diagnosis not present

## 2017-03-02 DIAGNOSIS — S82832A Other fracture of upper and lower end of left fibula, initial encounter for closed fracture: Secondary | ICD-10-CM | POA: Diagnosis not present

## 2017-03-05 ENCOUNTER — Ambulatory Visit (INDEPENDENT_AMBULATORY_CARE_PROVIDER_SITE_OTHER): Payer: Medicare Other | Admitting: Internal Medicine

## 2017-03-25 ENCOUNTER — Other Ambulatory Visit (HOSPITAL_COMMUNITY): Payer: Medicare Other

## 2017-03-30 DIAGNOSIS — S82832A Other fracture of upper and lower end of left fibula, initial encounter for closed fracture: Secondary | ICD-10-CM | POA: Diagnosis not present

## 2017-03-31 ENCOUNTER — Encounter (HOSPITAL_COMMUNITY): Payer: Medicare Other | Attending: Oncology

## 2017-03-31 DIAGNOSIS — D649 Anemia, unspecified: Secondary | ICD-10-CM | POA: Diagnosis not present

## 2017-03-31 LAB — CBC WITH DIFFERENTIAL/PLATELET
Basophils Absolute: 0 10*3/uL (ref 0.0–0.1)
Basophils Relative: 1 %
Eosinophils Absolute: 0.1 10*3/uL (ref 0.0–0.7)
Eosinophils Relative: 3 %
HCT: 29.5 % — ABNORMAL LOW (ref 39.0–52.0)
Hemoglobin: 9.9 g/dL — ABNORMAL LOW (ref 13.0–17.0)
Lymphocytes Relative: 43 %
Lymphs Abs: 1.8 10*3/uL (ref 0.7–4.0)
MCH: 32 pg (ref 26.0–34.0)
MCHC: 33.6 g/dL (ref 30.0–36.0)
MCV: 95.5 fL (ref 78.0–100.0)
Monocytes Absolute: 0.6 10*3/uL (ref 0.1–1.0)
Monocytes Relative: 16 %
Neutro Abs: 1.5 10*3/uL — ABNORMAL LOW (ref 1.7–7.7)
Neutrophils Relative %: 37 %
Platelets: 233 10*3/uL (ref 150–400)
RBC: 3.09 MIL/uL — ABNORMAL LOW (ref 4.22–5.81)
RDW: 15.6 % — ABNORMAL HIGH (ref 11.5–15.5)
WBC: 4 10*3/uL (ref 4.0–10.5)

## 2017-04-07 ENCOUNTER — Ambulatory Visit (INDEPENDENT_AMBULATORY_CARE_PROVIDER_SITE_OTHER): Payer: Medicare Other | Admitting: Internal Medicine

## 2017-04-14 ENCOUNTER — Encounter (INDEPENDENT_AMBULATORY_CARE_PROVIDER_SITE_OTHER): Payer: Self-pay | Admitting: Internal Medicine

## 2017-04-14 ENCOUNTER — Ambulatory Visit (INDEPENDENT_AMBULATORY_CARE_PROVIDER_SITE_OTHER): Payer: Medicare Other | Admitting: Internal Medicine

## 2017-04-24 DIAGNOSIS — H40013 Open angle with borderline findings, low risk, bilateral: Secondary | ICD-10-CM | POA: Diagnosis not present

## 2017-04-24 DIAGNOSIS — H2513 Age-related nuclear cataract, bilateral: Secondary | ICD-10-CM | POA: Diagnosis not present

## 2017-04-24 DIAGNOSIS — H25013 Cortical age-related cataract, bilateral: Secondary | ICD-10-CM | POA: Diagnosis not present

## 2017-04-24 DIAGNOSIS — H3509 Other intraretinal microvascular abnormalities: Secondary | ICD-10-CM | POA: Diagnosis not present

## 2017-04-24 DIAGNOSIS — H2512 Age-related nuclear cataract, left eye: Secondary | ICD-10-CM | POA: Diagnosis not present

## 2017-05-06 ENCOUNTER — Other Ambulatory Visit (HOSPITAL_COMMUNITY): Payer: Medicare Other

## 2017-05-07 ENCOUNTER — Encounter (INDEPENDENT_AMBULATORY_CARE_PROVIDER_SITE_OTHER): Payer: Self-pay

## 2017-05-07 ENCOUNTER — Encounter (HOSPITAL_COMMUNITY): Payer: Medicare Other | Attending: Oncology

## 2017-05-07 ENCOUNTER — Encounter (INDEPENDENT_AMBULATORY_CARE_PROVIDER_SITE_OTHER): Payer: Self-pay | Admitting: Internal Medicine

## 2017-05-07 ENCOUNTER — Ambulatory Visit (INDEPENDENT_AMBULATORY_CARE_PROVIDER_SITE_OTHER): Payer: Medicare Other | Admitting: Internal Medicine

## 2017-05-07 VITALS — BP 136/80 | HR 60 | Temp 98.4°F | Ht 65.0 in | Wt 161.3 lb

## 2017-05-07 DIAGNOSIS — D649 Anemia, unspecified: Secondary | ICD-10-CM | POA: Diagnosis not present

## 2017-05-07 LAB — CBC WITH DIFFERENTIAL/PLATELET
Basophils Absolute: 0 10*3/uL (ref 0.0–0.1)
Basophils Relative: 1 %
Eosinophils Absolute: 0.1 10*3/uL (ref 0.0–0.7)
Eosinophils Relative: 2 %
HCT: 29.1 % — ABNORMAL LOW (ref 39.0–52.0)
Hemoglobin: 9.7 g/dL — ABNORMAL LOW (ref 13.0–17.0)
Lymphocytes Relative: 29 %
Lymphs Abs: 1.7 10*3/uL (ref 0.7–4.0)
MCH: 30.7 pg (ref 26.0–34.0)
MCHC: 33.3 g/dL (ref 30.0–36.0)
MCV: 92.1 fL (ref 78.0–100.0)
Monocytes Absolute: 0.3 10*3/uL (ref 0.1–1.0)
Monocytes Relative: 5 %
Neutro Abs: 3.6 10*3/uL (ref 1.7–7.7)
Neutrophils Relative %: 63 %
Platelets: 246 10*3/uL (ref 150–400)
RBC: 3.16 MIL/uL — ABNORMAL LOW (ref 4.22–5.81)
RDW: 16.1 % — ABNORMAL HIGH (ref 11.5–15.5)
WBC: 5.7 10*3/uL (ref 4.0–10.5)

## 2017-05-07 NOTE — Progress Notes (Signed)
Subjective:    Patient ID: Roger Hansen, male    DOB: 02/19/1946, 71 y.o.   MRN: [id number]  HPI Here today for f/u. Last seen in December for anemia. He tells me is doing okay for the most part.  He is followed by the [facility] for anemia. He has a BM 2-3 times a day.  No melena or BRRB He is continues to drink etoh.  He is drinking at least a pint of liquor a day. He did not turn in stool cards. Stool was negative at last OV. His appetite has remained good.  Weight December of 2017 175. Today his weight is   161. 10/17/2016 Bone marrow aspirate, biopsy: revealed normocytic normochromic anemia. Leukopenia.   . CBC    Component Value Date/Time   WBC 5.7 05/07/2017 0842   RBC 3.16 (L) 05/07/2017 0842   HGB 9.7 (L) 05/07/2017 0842   HCT 29.1 (L) 05/07/2017 0842   HCT 27.9 (L) 08/13/2016 0944   PLT 246 05/07/2017 0842   MCV 92.1 05/07/2017 0842   MCH 30.7 05/07/2017 0842   MCHC 33.3 05/07/2017 0842   RDW 16.1 (H) 05/07/2017 0842   LYMPHSABS 1.7 05/07/2017 0842   MONOABS 0.3 05/07/2017 0842   EOSABS 0.1 05/07/2017 0842   BASOSABS 0.0 05/07/2017 0842      211/11/2013: Procedure:   Colonoscopy  Indications:  Patient is 71 year old African American male with history of colonic adenomas who is here for surveillance colonoscopy. His last exam was in June 2011 with removal of multiple polyps which were tubular adenomas and 1 with high-grade dysplasia. Family history is negative for CRC.  Impression:  Normal mucosa of terminal ileum. Two small polyps were cold snared and submitted together. These were located at cecum and splenic flexure. Small external hemorrhoids.  Patient had 2 polyps removed and these are tubular adenomas. He had multiple adenomas removed on prior colonoscopy as well. Next colonoscopy in 5 years. Review of Systems Past Medical History:  Diagnosis Date  . Alcohol abuse 11/24/2016  . Anemia 10/11/2013  . Chronic renal disease, stage  3, moderately decreased glomerular filtration rate (GFR) between 30-59 mL/min/1.73 square meter 12/02/2016  . Colon polyps   . GERD (gastroesophageal reflux disease)   . High cholesterol   . Hypertension   . Sinus infection     Past Surgical History:  Procedure Laterality Date  . COLONOSCOPY N/A 10/26/2013   Procedure: COLONOSCOPY;  Surgeon: Rogene Houston, MD;  [facility];  Service: Endoscopy;  Laterality: N/A;  325-moved to 1230 Ann to notify pt  . Colonoscopy with polypectomy      Allergies  Allergen Reactions  . 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 aspirin 81 MG tablet Take 81 mg by mouth daily.    . cholecalciferol (VITAMIN D) 1000 UNITS tablet Take 1,000 Units by mouth 2 (two) times daily.    . folic acid (FOLVITE) 1 MG tablet Take 1 mg by mouth daily.    Marland Kitchen gabapentin (NEURONTIN) 300 MG capsule Take 300 mg by mouth 3 (three) times daily as needed.    Marland Kitchen gemfibrozil (LOPID) 600 MG tablet Take 600 mg by mouth 2 (two) times daily before a meal.    . losartan (COZAAR) 50 MG tablet Take 50 mg by mouth daily.    Marland Kitchen omeprazole (PRILOSEC) 20 MG capsule Take 20 mg by mouth  daily as needed (Acid Reflux).    . potassium chloride (K-DUR) 10 MEQ tablet Take 20 mEq by mouth 2 (two) times daily.    . vitamin B-12 (CYANOCOBALAMIN) 1000 MCG tablet Take 1,000 mcg by mouth daily.     No current facility-administered medications on file prior to visit.        Objective:   Physical Exam Blood pressure 136/80, pulse 60, temperature 98.4 F (36.9 C), height 5\' 5"  (1.651 m), weight 161 lb 4.8 oz (73.2 kg). Alert and oriented. Skin warm and dry. Oral mucosa is moist.   . Sclera anicteric, conjunctivae is pink. Thyroid not enlarged. No cervical lymphadenopathy. Lungs clear. Heart regular rate and rhythm.  Abdomen is soft. Bowel sounds are positive. No hepatomegaly. No abdominal  masses felt. No tenderness.  No edema to lower extremities.            Assessment & Plan:  Normocytic anemia.  Continues to drink etoh. Am going to send 3 stool cards home with him.  OV in 3 months.

## 2017-05-07 NOTE — Patient Instructions (Signed)
3 stool card home with patient.

## 2017-05-14 DIAGNOSIS — M85621 Other cyst of bone, right upper arm: Secondary | ICD-10-CM | POA: Diagnosis not present

## 2017-05-14 DIAGNOSIS — Z6828 Body mass index (BMI) 28.0-28.9, adult: Secondary | ICD-10-CM | POA: Diagnosis not present

## 2017-05-26 DIAGNOSIS — M85621 Other cyst of bone, right upper arm: Secondary | ICD-10-CM | POA: Diagnosis not present

## 2017-05-26 DIAGNOSIS — M7989 Other specified soft tissue disorders: Secondary | ICD-10-CM | POA: Diagnosis not present

## 2017-05-26 DIAGNOSIS — R2230 Localized swelling, mass and lump, unspecified upper limb: Secondary | ICD-10-CM | POA: Diagnosis not present

## 2017-06-11 ENCOUNTER — Ambulatory Visit (HOSPITAL_COMMUNITY): Payer: Medicare Other | Admitting: Oncology

## 2017-06-11 ENCOUNTER — Ambulatory Visit (HOSPITAL_COMMUNITY): Payer: Medicare Other | Admitting: Adult Health

## 2017-06-11 ENCOUNTER — Other Ambulatory Visit (HOSPITAL_COMMUNITY): Payer: Medicare Other

## 2017-07-12 IMAGING — CT CT BIOPSY
1 of 2 series · 15 of 30 positions shown, 19 images · non-contrast
Comparison: none

INDICATION: Chronic and progressive anemia, unspecified

[Series 2: i-spiral 5.0 b70f · axial · 0.74mm/px · z∈[-314,-184]mm · 15 of 43 slices shown, 19 images]
[im 3/43  mediastinal]
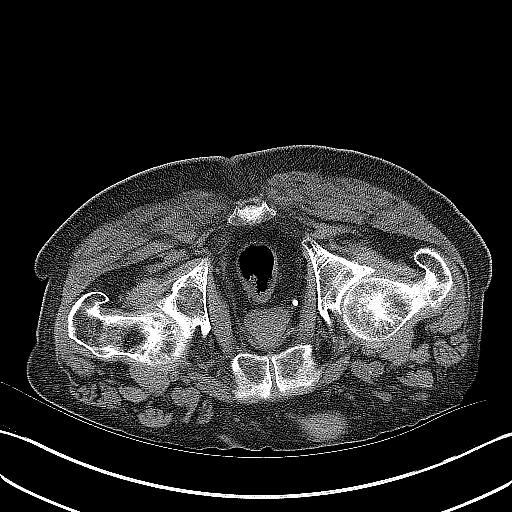
[im 3/43  lung]
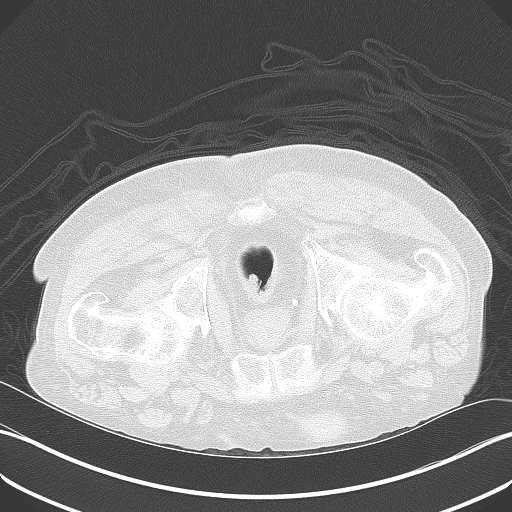
[im 7/43  lung]
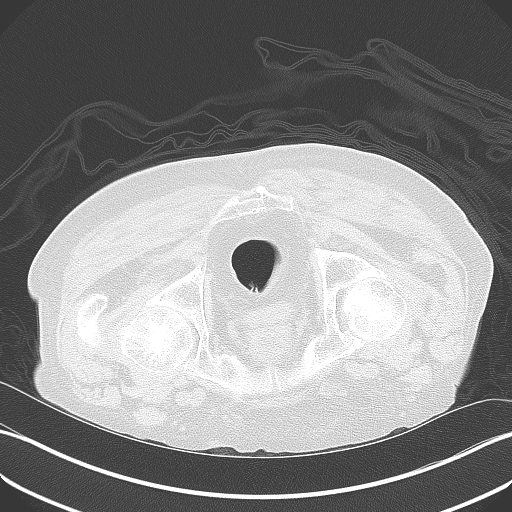
[im 9/43  lung]
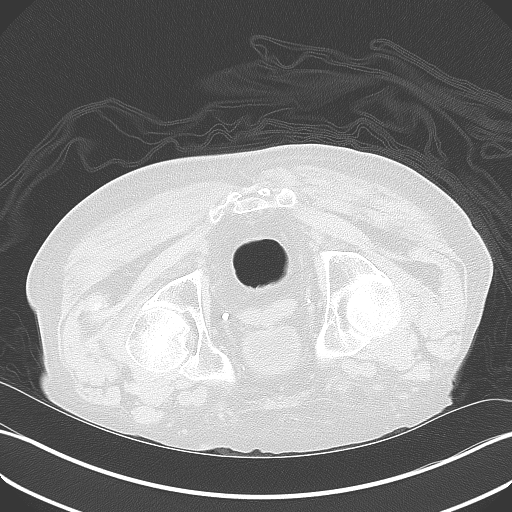
[im 11/43  lung]
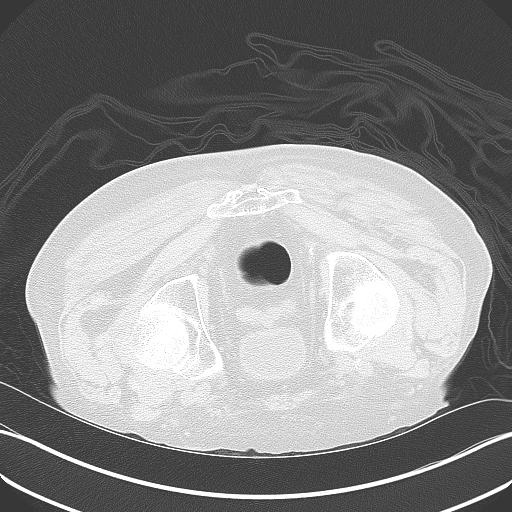
[im 15/43  mediastinal]
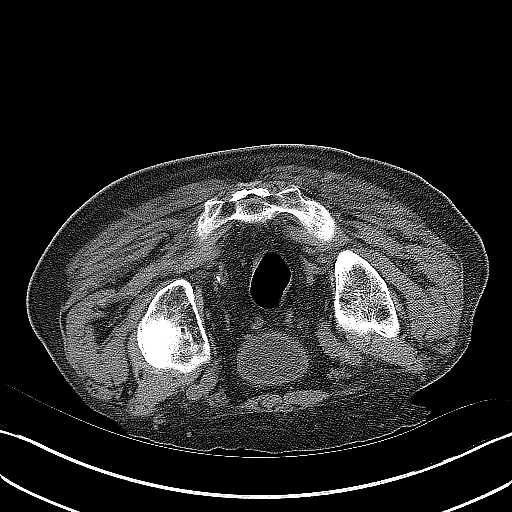
[im 15/43  lung]
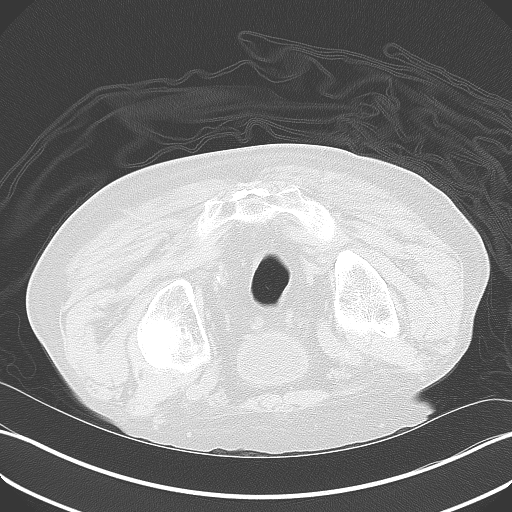
[im 17/43  lung]
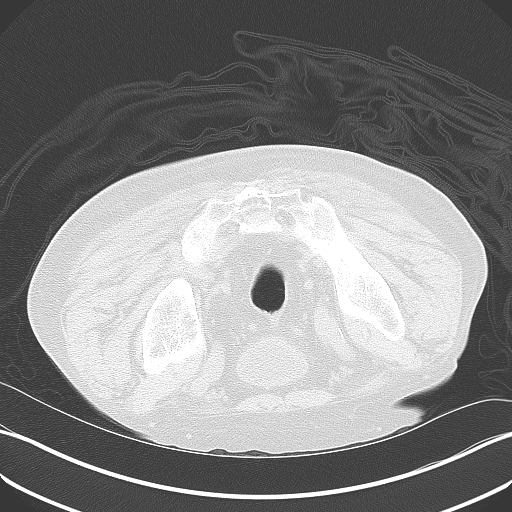
[im 20/43  lung]
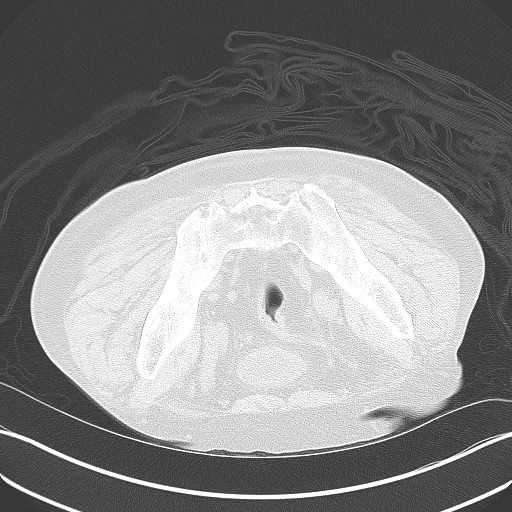
[im 22/43  lung]
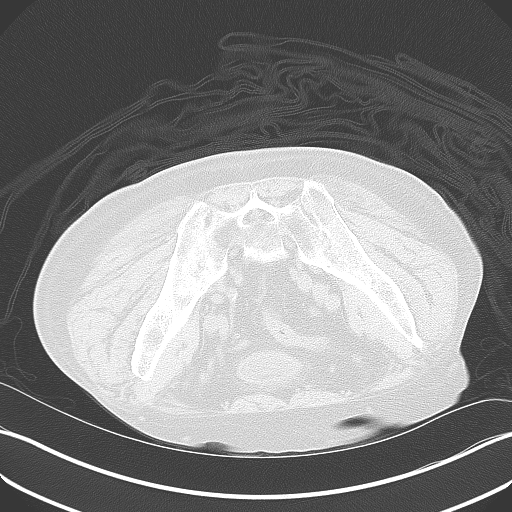
[im 24/43  mediastinal]
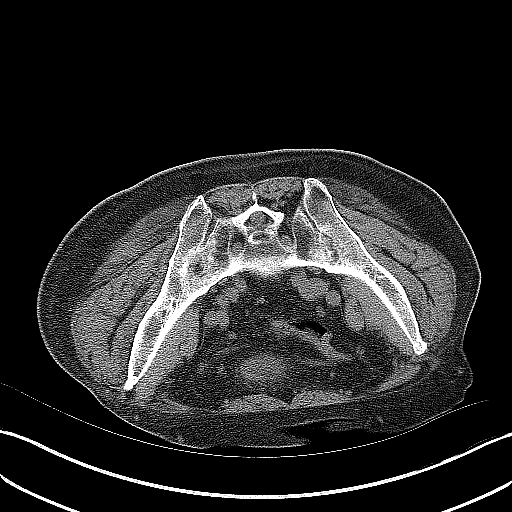
[im 24/43  lung]
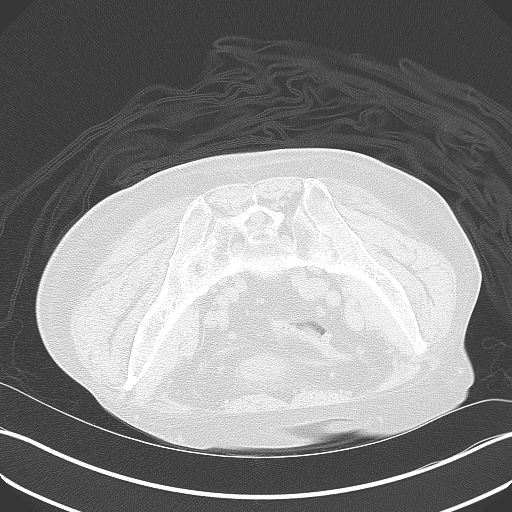
[im 28/43  lung]
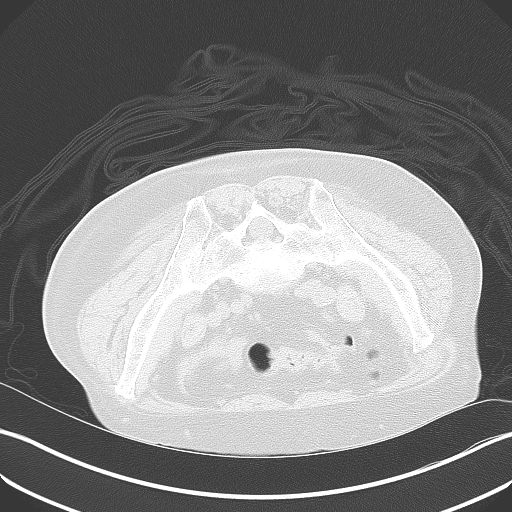
[im 29/43  lung]
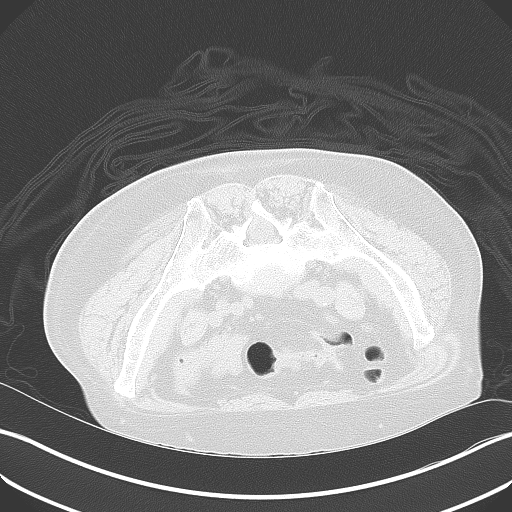
[im 32/43  lung]
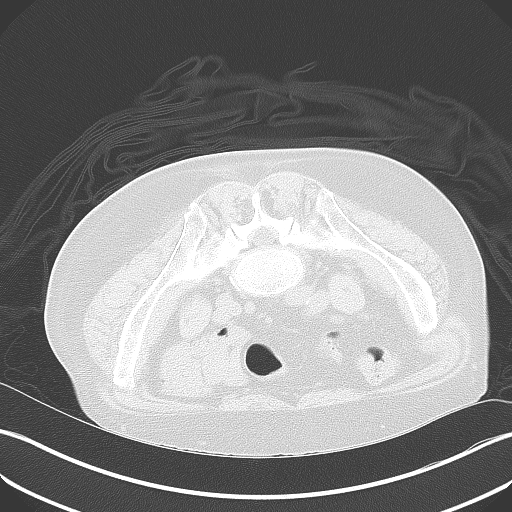
[im 34/43  mediastinal]
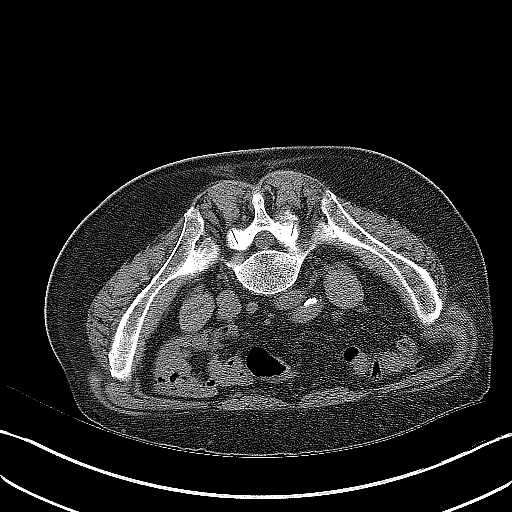
[im 34/43  lung]
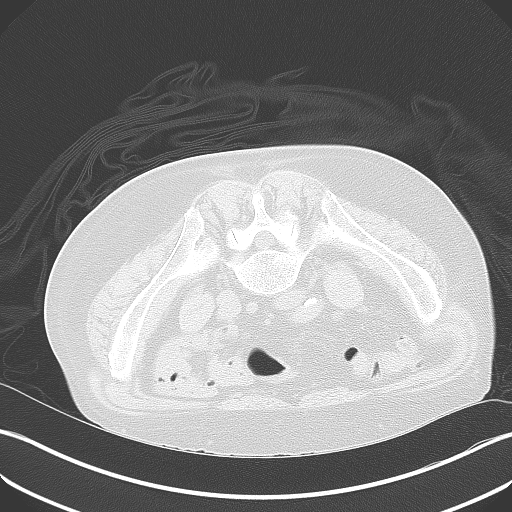
[im 36/43  lung]
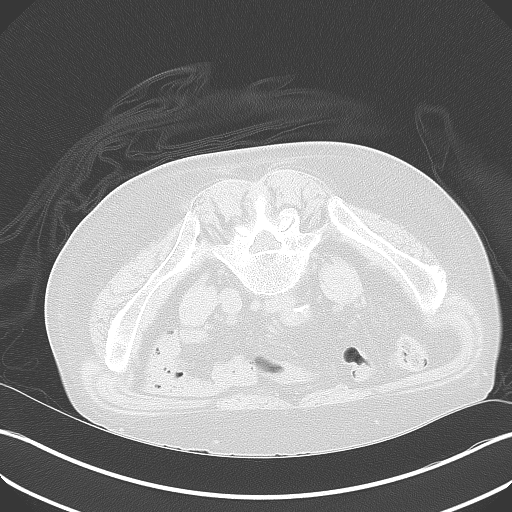
[im 40/43  lung]
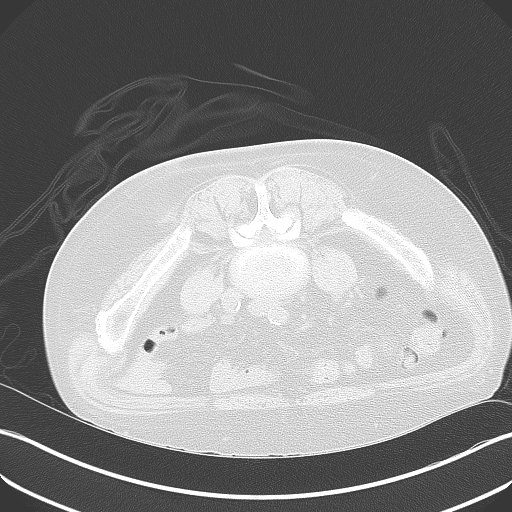

[15 of 30 positions shown; findings below may reference images not displayed]

EXAM:
CT GUIDED RIGHT ILIAC BONE MARROW ASPIRATION AND CORE BIOPSY

Radiologist:  Joshjax, Rtoyota

Guidance:  CT

FLUOROSCOPY TIME:  Fluoroscopy Time: NONE.

MEDICATIONS:
1% lidocaine locally

ANESTHESIA/SEDATION:
4.0 mg IV Versed; 75 mcg IV Fentanyl

Moderate Sedation Time:  11 minutes

The patient was continuously monitored during the procedure by the
interventional radiology nurse under my direct supervision.

CONTRAST:  None.

COMPLICATIONS:
None

PROCEDURE:
Informed consent was obtained from the patient following explanation
of the procedure, risks, benefits and alternatives. The patient
understands, agrees and consents for the procedure. All questions
were addressed. A time out was performed.

The patient was positioned prone and non-contrast localization CT
was performed of the pelvis to demonstrate the iliac marrow spaces.

Maximal barrier sterile technique utilized including caps, mask,
sterile gowns, sterile gloves, large sterile drape, hand hygiene,
and Betadine prep.

Under sterile conditions and local anesthesia, an 11 gauge coaxial
bone biopsy needle was advanced into the right iliac marrow space.
Needle position was confirmed with CT imaging. Initially, bone
marrow aspiration was performed. Next, the 11 gauge outer cannula
was utilized to obtain a right iliac bone marrow core biopsy. Needle
was removed. Hemostasis was obtained with compression. The patient
tolerated the procedure well. Samples were prepared with the
cytotechnologist. No immediate complications.
IMPRESSION: CT guided right iliac bone marrow aspiration and core biopsy.

## 2017-09-11 DIAGNOSIS — R1013 Epigastric pain: Secondary | ICD-10-CM | POA: Diagnosis not present

## 2017-09-11 DIAGNOSIS — Z6826 Body mass index (BMI) 26.0-26.9, adult: Secondary | ICD-10-CM | POA: Diagnosis not present

## 2017-10-01 DIAGNOSIS — K219 Gastro-esophageal reflux disease without esophagitis: Secondary | ICD-10-CM | POA: Diagnosis not present

## 2017-10-01 DIAGNOSIS — I10 Essential (primary) hypertension: Secondary | ICD-10-CM | POA: Diagnosis not present

## 2017-10-15 DIAGNOSIS — H5203 Hypermetropia, bilateral: Secondary | ICD-10-CM | POA: Diagnosis not present

## 2017-10-15 DIAGNOSIS — H524 Presbyopia: Secondary | ICD-10-CM | POA: Diagnosis not present

## 2017-10-15 DIAGNOSIS — H11153 Pinguecula, bilateral: Secondary | ICD-10-CM | POA: Diagnosis not present

## 2017-10-15 DIAGNOSIS — H2513 Age-related nuclear cataract, bilateral: Secondary | ICD-10-CM | POA: Diagnosis not present

## 2017-10-15 DIAGNOSIS — H11823 Conjunctivochalasis, bilateral: Secondary | ICD-10-CM | POA: Diagnosis not present

## 2017-10-15 DIAGNOSIS — H35033 Hypertensive retinopathy, bilateral: Secondary | ICD-10-CM | POA: Diagnosis not present

## 2017-10-15 DIAGNOSIS — H25013 Cortical age-related cataract, bilateral: Secondary | ICD-10-CM | POA: Diagnosis not present

## 2017-10-15 DIAGNOSIS — H52223 Regular astigmatism, bilateral: Secondary | ICD-10-CM | POA: Diagnosis not present

## 2017-10-15 DIAGNOSIS — H40013 Open angle with borderline findings, low risk, bilateral: Secondary | ICD-10-CM | POA: Diagnosis not present

## 2017-10-15 DIAGNOSIS — H18413 Arcus senilis, bilateral: Secondary | ICD-10-CM | POA: Diagnosis not present

## 2017-10-15 DIAGNOSIS — I10 Essential (primary) hypertension: Secondary | ICD-10-CM | POA: Diagnosis not present

## 2017-10-15 DIAGNOSIS — H25813 Combined forms of age-related cataract, bilateral: Secondary | ICD-10-CM | POA: Diagnosis not present

## 2017-11-09 ENCOUNTER — Ambulatory Visit (INDEPENDENT_AMBULATORY_CARE_PROVIDER_SITE_OTHER): Payer: Medicare Other | Admitting: Internal Medicine

## 2017-11-10 DIAGNOSIS — H40013 Open angle with borderline findings, low risk, bilateral: Secondary | ICD-10-CM | POA: Diagnosis not present

## 2017-11-10 DIAGNOSIS — H2513 Age-related nuclear cataract, bilateral: Secondary | ICD-10-CM | POA: Diagnosis not present

## 2017-11-10 DIAGNOSIS — H25013 Cortical age-related cataract, bilateral: Secondary | ICD-10-CM | POA: Diagnosis not present

## 2017-11-10 DIAGNOSIS — H35033 Hypertensive retinopathy, bilateral: Secondary | ICD-10-CM | POA: Diagnosis not present

## 2017-11-11 ENCOUNTER — Encounter (INDEPENDENT_AMBULATORY_CARE_PROVIDER_SITE_OTHER): Payer: Self-pay | Admitting: Internal Medicine

## 2017-11-11 ENCOUNTER — Ambulatory Visit (INDEPENDENT_AMBULATORY_CARE_PROVIDER_SITE_OTHER): Payer: Medicare Other | Admitting: Internal Medicine

## 2017-11-11 ENCOUNTER — Encounter (INDEPENDENT_AMBULATORY_CARE_PROVIDER_SITE_OTHER): Payer: Self-pay

## 2017-11-11 VITALS — BP 140/80 | HR 72 | Temp 98.2°F | Ht 65.0 in | Wt 159.2 lb

## 2017-11-11 DIAGNOSIS — D649 Anemia, unspecified: Secondary | ICD-10-CM

## 2017-11-11 DIAGNOSIS — Z86010 Personal history of colonic polyps: Secondary | ICD-10-CM | POA: Diagnosis not present

## 2017-11-11 DIAGNOSIS — F1010 Alcohol abuse, uncomplicated: Secondary | ICD-10-CM

## 2017-11-11 NOTE — Progress Notes (Addendum)
Subjective:    Patient ID: Roger Hansen, male    DOB: 13-May-1946, 71 y.o.   MRN: [id number]  HPI Here tod for f/u. Last seen in May of this year.  Hx of anemia and is followed by the [facility] at [facility].  His last colonoscopy was in 2014. His stool has been negative for blood by me in December of 2017  He tells me he is doing good.  Appetite is good. No weight loss.  Has a BM daily. No melena or BRRB. He continues to drinks about a 1 pint a day and sometimes a little bit more.    CBC Latest Ref Rng & Units 05/07/2017 03/31/2017 02/11/2017  WBC 4.0 - 10.5 K/uL 5.7 4.0 5.3  Hemoglobin 13.0 - 17.0 g/dL 9.7(L) 9.9(L) 9.1(L)  Hematocrit 39.0 - 52.0 % 29.1(L) 29.5(L) 26.7(L)  Platelets 150 - 400 K/uL 246 233 125(L)   08/13/2016 Ferritin 311, Folate 27.9 Iron 37, TIBC 301, UIBC 264 Hemoglobin in 2014 10.5   10/17/2016 Bone marrow aspirate, biopsy:revealed normocytic normochromic anemia. Leukopenia.    01/26/2013: Procedure: Colonoscopy  Indications:Patient is 71 year old African American male with history of colonic adenomas who is here for surveillance colonoscopy. His last exam was in June 2011 with removal of multiple polyps which were tubular adenomas and 1 with high-grade dysplasia. Family history is negative for CRC.  Impression:  Normal mucosa of terminal ileum. Two small polyps were cold snared and submitted together. These were located at cecum and splenic flexure. Small external hemorrhoids.  Patient had 2 polyps removed and these are tubular adenomas. He had multiple adenomas removed on prior colonoscopy as well. Next colonoscopy in 5 years.   Review of Systems Past Medical History:  Diagnosis Date  . Alcohol abuse 11/24/2016  . Anemia 10/11/2013  . Chronic renal disease, stage 3, moderately decreased glomerular filtration rate (GFR) between 30-59 mL/min/1.73 square meter (HCC) 12/02/2016  . Colon polyps   . GERD (gastroesophageal reflux disease)   . High  cholesterol   . Hypertension   . Sinus infection     Past Surgical History:  Procedure Laterality Date  . COLONOSCOPY N/A 10/26/2013   Procedure: COLONOSCOPY;  Surgeon: Rogene Houston, MD;  Location: [facility];  Service: Endoscopy;  Laterality: N/A;  325-moved to 1230 Ann to notify pt  . Colonoscopy with polypectomy      Allergies  Allergen Reactions  . Horseradish [Cochlearia Armoracia] Anaphylaxis    Current Outpatient Medications on File Prior to Visit  Medication Sig Dispense Refill  . aspirin 81 MG tablet Take 81 mg by mouth daily.    . cholecalciferol (VITAMIN D) 1000 UNITS tablet Take 1,000 Units by mouth 2 (two) times daily.    . folic acid (FOLVITE) 1 MG tablet Take 1 mg by mouth daily.    Marland Kitchen gabapentin (NEURONTIN) 300 MG capsule Take 300 mg by mouth 3 (three) times daily as needed.    Marland Kitchen gemfibrozil (LOPID) 600 MG tablet Take 600 mg by mouth 2 (two) times daily before a meal.    . losartan (COZAAR) 50 MG tablet Take 50 mg by mouth daily.    Marland Kitchen omeprazole (PRILOSEC) 20 MG capsule Take 20 mg by mouth daily as needed (Acid Reflux).    . potassium chloride (K-DUR) 10 MEQ tablet Take 20 mEq by mouth 2 (two) times daily.    . vitamin B-12 (CYANOCOBALAMIN) 1000 MCG tablet Take 1,000 mcg by mouth daily.     No current facility-administered  medications on file prior to visit.         Objective:   Physical Exam Blood pressure 140/80, pulse 72, temperature 98.2 F (36.8 C), height 5\' 5"  (1.651 m), weight 159 lb 3.2 oz (72.2 kg). Alert and oriented. Skin warm and dry. Oral mucosa is moist.   . Sclera anicteric, conjunctivae is pink. Thyroid not enlarged. No cervical lymphadenopathy. Lungs clear. Heart regular rate and rhythm.  Abdomen is soft. Bowel sounds are positive. No hepatomegaly. No abdominal masses felt. No tenderness.  No edema to lower extremities.           Assessment & Plan:  Anemia, normocytic. Three stool cards home with patent. Hx of colon polyps. He  states he will schedule colonoscopy when he bring stool cards back. He is due for a colonoscopy in February of 2019

## 2017-11-11 NOTE — Patient Instructions (Signed)
3 stool cards with patient.

## 2017-11-25 DIAGNOSIS — H2512 Age-related nuclear cataract, left eye: Secondary | ICD-10-CM | POA: Diagnosis not present

## 2017-11-25 DIAGNOSIS — H25812 Combined forms of age-related cataract, left eye: Secondary | ICD-10-CM | POA: Diagnosis not present

## 2017-12-02 DIAGNOSIS — H2511 Age-related nuclear cataract, right eye: Secondary | ICD-10-CM | POA: Diagnosis not present

## 2017-12-02 DIAGNOSIS — H25011 Cortical age-related cataract, right eye: Secondary | ICD-10-CM | POA: Diagnosis not present

## 2018-01-14 DIAGNOSIS — Z23 Encounter for immunization: Secondary | ICD-10-CM | POA: Diagnosis not present

## 2018-02-03 DIAGNOSIS — H25811 Combined forms of age-related cataract, right eye: Secondary | ICD-10-CM | POA: Diagnosis not present

## 2018-02-03 DIAGNOSIS — H2511 Age-related nuclear cataract, right eye: Secondary | ICD-10-CM | POA: Diagnosis not present

## 2018-04-07 DIAGNOSIS — F332 Major depressive disorder, recurrent severe without psychotic features: Secondary | ICD-10-CM | POA: Diagnosis not present

## 2018-04-07 DIAGNOSIS — F4312 Post-traumatic stress disorder, chronic: Secondary | ICD-10-CM | POA: Diagnosis not present

## 2018-08-04 DIAGNOSIS — F332 Major depressive disorder, recurrent severe without psychotic features: Secondary | ICD-10-CM | POA: Diagnosis not present

## 2018-08-04 DIAGNOSIS — F4312 Post-traumatic stress disorder, chronic: Secondary | ICD-10-CM | POA: Diagnosis not present

## 2018-08-17 DIAGNOSIS — R931 Abnormal findings on diagnostic imaging of heart and coronary circulation: Secondary | ICD-10-CM | POA: Diagnosis not present

## 2018-08-17 DIAGNOSIS — R079 Chest pain, unspecified: Secondary | ICD-10-CM | POA: Diagnosis not present

## 2018-08-20 DIAGNOSIS — R319 Hematuria, unspecified: Secondary | ICD-10-CM | POA: Diagnosis not present

## 2018-08-20 DIAGNOSIS — K8020 Calculus of gallbladder without cholecystitis without obstruction: Secondary | ICD-10-CM | POA: Diagnosis not present

## 2018-08-20 DIAGNOSIS — N183 Chronic kidney disease, stage 3 (moderate): Secondary | ICD-10-CM | POA: Diagnosis not present

## 2018-08-20 DIAGNOSIS — N289 Disorder of kidney and ureter, unspecified: Secondary | ICD-10-CM | POA: Diagnosis not present

## 2018-08-20 DIAGNOSIS — K389 Disease of appendix, unspecified: Secondary | ICD-10-CM | POA: Diagnosis not present

## 2018-09-07 DIAGNOSIS — R197 Diarrhea, unspecified: Secondary | ICD-10-CM | POA: Diagnosis not present

## 2018-09-09 DIAGNOSIS — I10 Essential (primary) hypertension: Secondary | ICD-10-CM | POA: Diagnosis not present

## 2018-09-09 DIAGNOSIS — D649 Anemia, unspecified: Secondary | ICD-10-CM | POA: Diagnosis not present

## 2018-09-09 DIAGNOSIS — K219 Gastro-esophageal reflux disease without esophagitis: Secondary | ICD-10-CM | POA: Diagnosis not present

## 2018-09-09 DIAGNOSIS — E559 Vitamin D deficiency, unspecified: Secondary | ICD-10-CM | POA: Diagnosis not present

## 2018-09-09 DIAGNOSIS — N183 Chronic kidney disease, stage 3 (moderate): Secondary | ICD-10-CM | POA: Diagnosis not present

## 2018-09-09 DIAGNOSIS — R634 Abnormal weight loss: Secondary | ICD-10-CM | POA: Diagnosis not present

## 2018-09-09 DIAGNOSIS — D519 Vitamin B12 deficiency anemia, unspecified: Secondary | ICD-10-CM | POA: Diagnosis not present

## 2018-09-14 DIAGNOSIS — H11153 Pinguecula, bilateral: Secondary | ICD-10-CM | POA: Diagnosis not present

## 2018-09-14 DIAGNOSIS — H16223 Keratoconjunctivitis sicca, not specified as Sjogren's, bilateral: Secondary | ICD-10-CM | POA: Diagnosis not present

## 2018-09-14 DIAGNOSIS — H11823 Conjunctivochalasis, bilateral: Secondary | ICD-10-CM | POA: Diagnosis not present

## 2018-09-14 DIAGNOSIS — H26493 Other secondary cataract, bilateral: Secondary | ICD-10-CM | POA: Diagnosis not present

## 2018-09-14 DIAGNOSIS — Z961 Presence of intraocular lens: Secondary | ICD-10-CM | POA: Diagnosis not present

## 2018-09-14 DIAGNOSIS — H35033 Hypertensive retinopathy, bilateral: Secondary | ICD-10-CM | POA: Diagnosis not present

## 2018-09-14 DIAGNOSIS — H04123 Dry eye syndrome of bilateral lacrimal glands: Secondary | ICD-10-CM | POA: Diagnosis not present

## 2018-09-14 DIAGNOSIS — I10 Essential (primary) hypertension: Secondary | ICD-10-CM | POA: Diagnosis not present

## 2018-09-14 DIAGNOSIS — H18413 Arcus senilis, bilateral: Secondary | ICD-10-CM | POA: Diagnosis not present

## 2018-09-20 ENCOUNTER — Encounter: Payer: Self-pay | Admitting: *Deleted

## 2018-09-21 ENCOUNTER — Ambulatory Visit (INDEPENDENT_AMBULATORY_CARE_PROVIDER_SITE_OTHER): Payer: Medicare Other | Admitting: Cardiovascular Disease

## 2018-09-21 ENCOUNTER — Telehealth: Payer: Self-pay | Admitting: Cardiovascular Disease

## 2018-09-21 ENCOUNTER — Other Ambulatory Visit (HOSPITAL_COMMUNITY): Payer: Self-pay

## 2018-09-21 ENCOUNTER — Encounter: Payer: Self-pay | Admitting: Cardiovascular Disease

## 2018-09-21 VITALS — BP 122/60 | HR 82 | Ht 65.0 in | Wt 169.0 lb

## 2018-09-21 DIAGNOSIS — R079 Chest pain, unspecified: Secondary | ICD-10-CM | POA: Diagnosis not present

## 2018-09-21 DIAGNOSIS — N183 Chronic kidney disease, stage 3 unspecified: Secondary | ICD-10-CM

## 2018-09-21 DIAGNOSIS — R9439 Abnormal result of other cardiovascular function study: Secondary | ICD-10-CM | POA: Diagnosis not present

## 2018-09-21 DIAGNOSIS — D696 Thrombocytopenia, unspecified: Secondary | ICD-10-CM

## 2018-09-21 DIAGNOSIS — I10 Essential (primary) hypertension: Secondary | ICD-10-CM

## 2018-09-21 DIAGNOSIS — D649 Anemia, unspecified: Secondary | ICD-10-CM | POA: Diagnosis not present

## 2018-09-21 DIAGNOSIS — I519 Heart disease, unspecified: Secondary | ICD-10-CM | POA: Diagnosis not present

## 2018-09-21 DIAGNOSIS — I429 Cardiomyopathy, unspecified: Secondary | ICD-10-CM | POA: Diagnosis not present

## 2018-09-21 NOTE — Telephone Encounter (Signed)
°  Pre-cert Verification for the following procedure   Echo w/ contrast - abnormal nuc stress test -scheduled for 09-23-2018 at [facility]

## 2018-09-21 NOTE — Progress Notes (Signed)
CARDIOLOGY CONSULT NOTE  Patient ID: Roger Hansen MRN: [id number] DOB/AGE: 1946/04/06 72 y.o.  Admit date: (Not on file) Primary Physician: Curlene Labrum, MD Referring Physician: Curlene Labrum, MD  Reason for Consultation: Chest pain  HPI: Roger Hansen is a 72 y.o. male who is being seen today for the evaluation of chest pain at the request of Burdine, Virgina Evener, MD.   Past medical history includes hypertension.  I personally reviewed an ECG performed on 05/03/2018 which demonstrated normal sinus rhythm with no ischemic ST segment or T wave abnormalities, nor any arrhythmias.  I reviewed lipids dated 07/30/2018: Total cholesterol elevated at 213, triglycerides 78, HDL 142, LDL 55.  Additional labs include BUN 10, creatinine 2.02, sodium 139, potassium 3.5, normal liver transaminases, white blood cell 6.2, hemoglobin 9.1, platelets slightly low at 140.  He underwent a nuclear stress test on 08/17/2018 at [facility].  There was no reversible ischemia or infarction.  There was mild inferolateral and inferior wall hypokinesis, LVEF 48%.  He tells me he has intermittent chest pains which can occur both at rest and with exertion.  It may last several minutes at a time.  It is sometimes on the left side of the chest, sometimes on the right, and sometimes in the center.  He describes it as mostly as a dull pain.  He rarely gets short of breath.  He denies palpitations.  He does get lightheaded and dizzy but denies falls.  He has a history of chronic alcohol use but has not had any alcohol for the past 2 months.  He told me he has PTSD and depression and likes to keep to himself.  He lives with his wife and "two very energetic grandchildren ".    Allergies  Allergen Reactions  . Horseradish [Armoracia Rusticana Ext (Horseradish)] Anaphylaxis    Current Outpatient Medications  Medication Sig Dispense Refill  . aspirin 81 MG tablet Take 81 mg by mouth daily.    Marland Kitchen  atorvastatin (LIPITOR) 10 MG tablet Take 10 mg by mouth daily.    Marland Kitchen buPROPion (ZYBAN) 150 MG 12 hr tablet Take 150 mg by mouth 2 (two) times daily.    . cholecalciferol (VITAMIN D) 1000 UNITS tablet Take 1,000 Units by mouth 2 (two) times daily.    . folic acid (FOLVITE) 1 MG tablet Take 1 mg by mouth daily.    Marland Kitchen gabapentin (NEURONTIN) 300 MG capsule Take 300 mg by mouth 3 (three) times daily as needed.    Marland Kitchen losartan (COZAAR) 50 MG tablet Take 50 mg by mouth daily.    Marland Kitchen omeprazole (PRILOSEC) 20 MG capsule Take 20 mg by mouth daily as needed (Acid Reflux).    . potassium chloride (K-DUR) 10 MEQ tablet Take 20 mEq by mouth 2 (two) times daily.    . traZODone (DESYREL) 50 MG tablet Take 50 mg by mouth at bedtime.    . vitamin B-12 (CYANOCOBALAMIN) 1000 MCG tablet Take 1,000 mcg by mouth daily.     No current facility-administered medications for this visit.     Past Medical History:  Diagnosis Date  . Alcohol abuse 11/24/2016  . Anemia 10/11/2013  . Chronic renal disease, stage 3, moderately decreased glomerular filtration rate (GFR) between 30-59 mL/min/1.73 square meter (HCC) 12/02/2016  . Colon polyps   . GERD (gastroesophageal reflux disease)   . High cholesterol   . Hypertension   . Sinus infection     Past Surgical  History:  Procedure Laterality Date  . COLONOSCOPY N/A 10/26/2013   Procedure: COLONOSCOPY;  Surgeon: Rogene Houston, MD;  Location: [facility];  Service: Endoscopy;  Laterality: N/A;  325-moved to 1230 Ann to notify pt  . Colonoscopy with polypectomy      Social History   Socioeconomic History  . Marital status: Married    Spouse name: Not on file  . Number of children: Not on file  . Years of education: Not on file  . Highest education level: Not on file  Occupational History  . Not on file  Social Needs  . Financial resource strain: Not on file  . Food insecurity:    Worry: Not on file    Inability: Not on file  . Transportation needs:    Medical:  Not on file    Non-medical: Not on file  Tobacco Use  . Smoking status: Former Smoker    Packs/day: 0.50    Years: 20.00    Pack years: 10.00  . Smokeless tobacco: Never Used  Substance and Sexual Activity  . Alcohol use: Yes    Comment: 1 pint of etoh a day  . Drug use: No  . Sexual activity: Not on file  Lifestyle  . Physical activity:    Days per week: Not on file    Minutes per session: Not on file  . Stress: Not on file  Relationships  . Social connections:    Talks on phone: Not on file    Gets together: Not on file    Attends religious service: Not on file    Active member of club or organization: Not on file    Attends meetings of clubs or organizations: Not on file    Relationship status: Not on file  . Intimate partner violence:    Fear of current or ex partner: Not on file    Emotionally abused: Not on file    Physically abused: Not on file    Forced sexual activity: Not on file  Other Topics Concern  . Not on file  Social History Narrative  . Not on file     No family history of premature CAD in 1st degree relatives.  Current Meds  Medication Sig  . aspirin 81 MG tablet Take 81 mg by mouth daily.  Marland Kitchen atorvastatin (LIPITOR) 10 MG tablet Take 10 mg by mouth daily.  Marland Kitchen buPROPion (ZYBAN) 150 MG 12 hr tablet Take 150 mg by mouth 2 (two) times daily.  . cholecalciferol (VITAMIN D) 1000 UNITS tablet Take 1,000 Units by mouth 2 (two) times daily.  . folic acid (FOLVITE) 1 MG tablet Take 1 mg by mouth daily.  Marland Kitchen gabapentin (NEURONTIN) 300 MG capsule Take 300 mg by mouth 3 (three) times daily as needed.  Marland Kitchen losartan (COZAAR) 50 MG tablet Take 50 mg by mouth daily.  Marland Kitchen omeprazole (PRILOSEC) 20 MG capsule Take 20 mg by mouth daily as needed (Acid Reflux).  . potassium chloride (K-DUR) 10 MEQ tablet Take 20 mEq by mouth 2 (two) times daily.  . traZODone (DESYREL) 50 MG tablet Take 50 mg by mouth at bedtime.  . vitamin B-12 (CYANOCOBALAMIN) 1000 MCG tablet Take 1,000 mcg  by mouth daily.      Review of systems complete and found to be negative unless listed above in HPI    Physical exam Blood pressure 122/60, pulse 82, height 5\' 5"  (1.651 m), weight 169 lb (76.7 kg), SpO2 97 %. General: NAD Neck: No JVD,  no thyromegaly or thyroid nodule.  Lungs: Clear to auscultation bilaterally with normal respiratory effort. CV: Nondisplaced PMI. Regular rate and rhythm, normal S1/S2, no S3/S4, no murmur.  No peripheral edema.  No carotid bruit.    Abdomen: Soft, nontender, no distention.  Skin: Intact without lesions or rashes.  Neurologic: Alert and oriented x 3.  Psych: Normal affect. Extremities: No clubbing or cyanosis.  HEENT: Normal.   ECG: Most recent ECG reviewed.   Labs: Lab Results  Component Value Date/Time   K 3.8 12/02/2016 08:55 AM   BUN 10 12/02/2016 08:55 AM   CREATININE 0.80 12/02/2016 08:55 AM   ALT 10 (L) 12/02/2016 08:55 AM   HGB 9.7 (L) 05/07/2017 08:42 AM     Lipids: No results found for: LDLCALC, LDLDIRECT, CHOL, TRIG, HDL      ASSESSMENT AND PLAN:  1.  Abnormal stress test/cardiomyopathy: While there was no myocardial ischemia or scar noted, LVEF was mildly decreased with wall motion abnormalities as described above.  He has primarily atypical chest pain symptomatology.   I will order a 2-D echocardiogram with Doppler to evaluate cardiac structure, function, and regional wall motion as this will provide a more accurate assessment of regional wall motion.  I will obtain this with contrast enhancement.  2.  Hypertension: Controlled on present therapy.  No changes.  3.  Chronic kidney disease stage III: Labs reviewed above.  4.  Anemia: Normocytic normochromic anemia dating back to 2014.  He is followed by hematology.  Labs reviewed above.    Disposition: Follow up in 2 months  Signed: Kate Sable, M.D., F.A.C.C.  09/21/2018, 1:00 PM

## 2018-09-21 NOTE — Patient Instructions (Signed)
Medication Instructions:  Continue all current medications.  Labwork: none  Testing/Procedures:  Your physician has requested that you have an echocardiogram with contrast.  Echocardiography is a painless test that uses sound waves to create images of your heart. It provides your doctor with information about the size and shape of your heart and how well your heart's chambers and valves are working. This procedure takes approximately one hour. There are no restrictions for this procedure.  Office will contact with results via phone or letter.    Follow-Up: 2 months   Any Other Special Instructions Will Be Listed Below (If Applicable).  If you need a refill on your cardiac medications before your next appointment, please call your pharmacy.

## 2018-09-21 NOTE — Addendum Note (Signed)
Addended by: Laurine Blazer on: 09/21/2018 01:29 PM   Modules accepted: Orders

## 2018-09-21 NOTE — Addendum Note (Signed)
Addended by: Laurine Blazer on: 09/21/2018 01:36 PM   Modules accepted: Orders

## 2018-09-22 ENCOUNTER — Inpatient Hospital Stay (HOSPITAL_COMMUNITY): Payer: Medicare Other | Attending: Hematology

## 2018-09-22 DIAGNOSIS — Z87891 Personal history of nicotine dependence: Secondary | ICD-10-CM | POA: Diagnosis not present

## 2018-09-22 DIAGNOSIS — F1010 Alcohol abuse, uncomplicated: Secondary | ICD-10-CM | POA: Diagnosis not present

## 2018-09-22 DIAGNOSIS — Z7982 Long term (current) use of aspirin: Secondary | ICD-10-CM | POA: Diagnosis not present

## 2018-09-22 DIAGNOSIS — D649 Anemia, unspecified: Secondary | ICD-10-CM | POA: Insufficient documentation

## 2018-09-22 DIAGNOSIS — Z79899 Other long term (current) drug therapy: Secondary | ICD-10-CM | POA: Diagnosis not present

## 2018-09-22 DIAGNOSIS — I10 Essential (primary) hypertension: Secondary | ICD-10-CM | POA: Diagnosis not present

## 2018-09-22 DIAGNOSIS — D696 Thrombocytopenia, unspecified: Secondary | ICD-10-CM

## 2018-09-22 LAB — CBC WITH DIFFERENTIAL/PLATELET
Abs Immature Granulocytes: 0.01 10*3/uL (ref 0.00–0.07)
Basophils Absolute: 0 10*3/uL (ref 0.0–0.1)
Basophils Relative: 1 %
Eosinophils Absolute: 0.2 10*3/uL (ref 0.0–0.5)
Eosinophils Relative: 4 %
HCT: 27 % — ABNORMAL LOW (ref 39.0–52.0)
Hemoglobin: 8.2 g/dL — ABNORMAL LOW (ref 13.0–17.0)
Immature Granulocytes: 0 %
Lymphocytes Relative: 29 %
Lymphs Abs: 1.2 10*3/uL (ref 0.7–4.0)
MCH: 28.4 pg (ref 26.0–34.0)
MCHC: 30.4 g/dL (ref 30.0–36.0)
MCV: 93.4 fL (ref 80.0–100.0)
Monocytes Absolute: 0.4 10*3/uL (ref 0.1–1.0)
Monocytes Relative: 9 %
Neutro Abs: 2.4 10*3/uL (ref 1.7–7.7)
Neutrophils Relative %: 57 %
Platelets: 277 10*3/uL (ref 150–400)
RBC: 2.89 MIL/uL — ABNORMAL LOW (ref 4.22–5.81)
RDW: 17.6 % — ABNORMAL HIGH (ref 11.5–15.5)
WBC: 4.2 10*3/uL (ref 4.0–10.5)
nRBC: 0 % (ref 0.0–0.2)

## 2018-09-22 LAB — COMPREHENSIVE METABOLIC PANEL
ALT: 7 U/L (ref 0–44)
AST: 15 U/L (ref 15–41)
Albumin: 3.7 g/dL (ref 3.5–5.0)
Alkaline Phosphatase: 57 U/L (ref 38–126)
Anion gap: 8 (ref 5–15)
BUN: 12 mg/dL (ref 8–23)
CO2: 23 mmol/L (ref 22–32)
Calcium: 9.1 mg/dL (ref 8.9–10.3)
Chloride: 105 mmol/L (ref 98–111)
Creatinine, Ser: 1.14 mg/dL (ref 0.61–1.24)
GFR calc Af Amer: >60 mL/min (ref >60)
GFR calc non Af Amer: >60 mL/min (ref >60)
Glucose, Bld: 92 mg/dL (ref 70–99)
Potassium: 4.4 mmol/L (ref 3.5–5.1)
Sodium: 136 mmol/L (ref 135–145)
Total Bilirubin: 0.6 mg/dL (ref 0.3–1.2)
Total Protein: 7.2 g/dL (ref 6.5–8.1)

## 2018-09-23 ENCOUNTER — Ambulatory Visit (HOSPITAL_COMMUNITY)
Admission: RE | Admit: 2018-09-23 | Discharge: 2018-09-23 | Disposition: A | Discharge status: Home / Self Care | Payer: Medicare Other | Source: Ambulatory Visit | Attending: Cardiovascular Disease | Admitting: Cardiovascular Disease

## 2018-09-23 ENCOUNTER — Ambulatory Visit: Payer: Medicare Other | Admitting: Cardiovascular Disease

## 2018-09-23 ENCOUNTER — Telehealth: Payer: Self-pay | Admitting: *Deleted

## 2018-09-23 DIAGNOSIS — I429 Cardiomyopathy, unspecified: Secondary | ICD-10-CM | POA: Insufficient documentation

## 2018-09-23 DIAGNOSIS — E785 Hyperlipidemia, unspecified: Secondary | ICD-10-CM | POA: Insufficient documentation

## 2018-09-23 DIAGNOSIS — R079 Chest pain, unspecified: Secondary | ICD-10-CM | POA: Insufficient documentation

## 2018-09-23 DIAGNOSIS — I10 Essential (primary) hypertension: Secondary | ICD-10-CM | POA: Insufficient documentation

## 2018-09-23 DIAGNOSIS — Z87891 Personal history of nicotine dependence: Secondary | ICD-10-CM | POA: Diagnosis not present

## 2018-09-23 NOTE — Telephone Encounter (Signed)
Notes recorded by Laurine Blazer, LPN on 81/18/8677 at 5:00 PM EDT Patient notified. Copy to pmd. Follow up scheduled for December with Dr. Bronson Ing in [place] office.   ------  Notes recorded by Herminio Commons, MD on 09/23/2018 at 2:43 PM EDT This study demonstrates: Pumping function is in the low normal range. The bottom wall of the heart's ability to pump is very mildly decreased. There is mild aortic and mitral valve leakage. Medication changes / Follow up studies / Other recommendations:  None. Please send results to the PCP: Burdine, Virgina Evener, MD   Kate Sable, MD 09/23/2018 2:42 PM

## 2018-09-23 NOTE — Progress Notes (Signed)
*  PRELIMINARY RESULTS* Echocardiogram 2D Echocardiogram has been performed.  Roger Hansen 09/23/2018, 9:25 AM

## 2018-09-24 ENCOUNTER — Inpatient Hospital Stay (HOSPITAL_BASED_OUTPATIENT_CLINIC_OR_DEPARTMENT_OTHER): Payer: Medicare Other | Admitting: Internal Medicine

## 2018-09-24 ENCOUNTER — Encounter

## 2018-09-24 ENCOUNTER — Ambulatory Visit: Payer: Medicare Other | Admitting: Cardiovascular Disease

## 2018-09-24 ENCOUNTER — Other Ambulatory Visit (HOSPITAL_COMMUNITY): Payer: Self-pay | Admitting: Internal Medicine

## 2018-09-24 ENCOUNTER — Other Ambulatory Visit: Payer: Self-pay

## 2018-09-24 ENCOUNTER — Encounter (HOSPITAL_COMMUNITY): Payer: Self-pay | Admitting: Internal Medicine

## 2018-09-24 VITALS — BP 107/55 | HR 93 | Temp 98.7°F | Resp 18 | Wt 168.0 lb

## 2018-09-24 DIAGNOSIS — D649 Anemia, unspecified: Secondary | ICD-10-CM | POA: Diagnosis not present

## 2018-09-24 DIAGNOSIS — Z79899 Other long term (current) drug therapy: Secondary | ICD-10-CM

## 2018-09-24 DIAGNOSIS — Z7982 Long term (current) use of aspirin: Secondary | ICD-10-CM

## 2018-09-24 DIAGNOSIS — F1010 Alcohol abuse, uncomplicated: Secondary | ICD-10-CM

## 2018-09-24 DIAGNOSIS — Z87891 Personal history of nicotine dependence: Secondary | ICD-10-CM

## 2018-09-24 DIAGNOSIS — I10 Essential (primary) hypertension: Secondary | ICD-10-CM

## 2018-09-24 DIAGNOSIS — D508 Other iron deficiency anemias: Secondary | ICD-10-CM

## 2018-09-24 MED ORDER — FERROUS SULFATE 325 (65 FE) MG PO TABS: 325.0000 mg | ORAL_TABLET | Freq: Every day | ORAL | 3 refills | Status: DC

## 2018-09-24 NOTE — Progress Notes (Signed)
Diagnosis No diagnosis found.  Staging Cancer Staging No matching staging information was found for the patient.  Assessment and Plan:  1.  Normocytic, normochromic anemia dating back to at least 2014 with negative peripheral work-up, negative bone marrow aspiration and biopsy and cytogenetics on 10/16/2016 in the setting of EtOH abuse.    Labs done 09/22/2018 reviewed and showed WBC 4.2 HB 8.2 plts 277,000.  Chemistries WNL with K+ 4.4, Cr 1.14 and normal LFTs.  Hepatitis panel checked in 01/2017 was WNL.    EtOH felt to be contributing factor to his anemia given its toxicity on the bone marrow.    Pt reports he was on iron tablets in the past.  He will resume oral iron and will repeat labs in 11/2018 for follow-up.  Pt is asymptomatic.  He had previous discussions regarding AA.    2.  ETOH use.  Pt had previous discussions regarding AA.  Pt should follow-up with PCP.    3.  HTN.  BP is 107/55.  Follow-up with PCP.   4.  GI symptoms.  Pt should follow-up with GI as recommended.  He has history of ETOH use.  Will consider imaging if symptoms fail to improve prior to next visit.    5.  Health maintenance.  Pt should follow-up with PCP and GI as recommended.   25 minutes spent with more than 50% spent in counseling and coordination of care.    Current Status:  Pt is seen today for follow-up.  He is here to go over labs.    Problem List Patient Active Problem List   Diagnosis Date Noted  . Alcohol abuse [F10.10] 11/24/2016  . History of colonic polyps [Z86.010] 10/11/2013  . Essential hypertension, benign [I10] 10/11/2013  . High cholesterol [E78.00] 10/11/2013  . Anemia [D64.9] 10/11/2013    Past Medical History Past Medical History:  Diagnosis Date  . Alcohol abuse 11/24/2016  . Anemia 10/11/2013  . Chronic renal disease, stage 3, moderately decreased glomerular filtration rate (GFR) between 30-59 mL/min/1.73 square meter (HCC) 12/02/2016  . Colon polyps   . GERD  (gastroesophageal reflux disease)   . High cholesterol   . Hypertension   . Sinus infection     Past Surgical History Past Surgical History:  Procedure Laterality Date  . COLONOSCOPY N/A 10/26/2013   Procedure: COLONOSCOPY;  Surgeon: Rogene Houston, MD;  [facility];  Service: Endoscopy;  Laterality: N/A;  325-moved to 1230 Ann to notify pt  . Colonoscopy with polypectomy      Family History Family History  Problem Relation Age of Onset  . Colon cancer Neg Hx      Social History  reports that he has quit smoking. He has a 10.00 pack-year smoking history. He has never used smokeless tobacco. He reports that he drinks alcohol. He reports that he does not use drugs.  Medications  Current Outpatient Medications:  .  aspirin 81 MG tablet, Take 81 mg by mouth daily., Disp: , Rfl:  .  atorvastatin (LIPITOR) 10 MG tablet, Take 10 mg by mouth daily., Disp: , Rfl:  .  buPROPion (ZYBAN) 150 MG 12 hr tablet, Take 150 mg by mouth 2 (two) times daily., Disp: , Rfl:  .  cholecalciferol (VITAMIN D) 1000 UNITS tablet, Take 1,000 Units by mouth 2 (two) times daily., Disp: , Rfl:  .  folic acid (FOLVITE) 1 MG tablet, Take 1 mg by mouth daily., Disp: , Rfl:  .  gabapentin (NEURONTIN) 300 MG  capsule, Take 300 mg by mouth 3 (three) times daily as needed., Disp: , Rfl:  .  losartan (COZAAR) 50 MG tablet, Take 50 mg by mouth daily., Disp: , Rfl:  .  omeprazole (PRILOSEC) 20 MG capsule, Take 20 mg by mouth daily as needed (Acid Reflux)., Disp: , Rfl:  .  potassium chloride (K-DUR) 10 MEQ tablet, Take 20 mEq by mouth 2 (two) times daily., Disp: , Rfl:  .  traZODone (DESYREL) 50 MG tablet, Take 50 mg by mouth at bedtime., Disp: , Rfl:  .  vitamin B-12 (CYANOCOBALAMIN) 1000 MCG tablet, Take 1,000 mcg by mouth daily., Disp: , Rfl:  .  ferrous sulfate 325 (65 FE) MG tablet, Take 1 tablet (325 mg total) by mouth daily with breakfast., Disp: 30 tablet, Rfl: 3  Allergies Horseradish [armoracia  rusticana ext (horseradish)]  Review of Systems Review of Systems - Oncology ROS negative other than GI symptoms.     Physical Exam  Vitals Wt Readings from Last 3 Encounters:  09/24/18 168 lb (76.2 kg)  09/21/18 169 lb (76.7 kg)  11/11/17 159 lb 3.2 oz (72.2 kg)   Temp Readings from Last 3 Encounters:  09/24/18 98.7 F (37.1 C) (Oral)  11/11/17 98.2 F (36.8 C)  05/07/17 98.4 F (36.9 C)   BP Readings from Last 3 Encounters:  09/24/18 (!) 107/55  09/21/18 122/60  11/11/17 140/80   Pulse Readings from Last 3 Encounters:  09/24/18 93  09/21/18 82  11/11/17 72   Constitutional: Well-developed, well-nourished, and in no distress.   HENT: Head: Normocephalic and atraumatic.  Mouth/Throat: No oropharyngeal exudate. Mucosa moist. Eyes: Pupils are equal, round, and reactive to light. Conjunctivae are normal. No scleral icterus.  Neck: Normal range of motion. Neck supple. No JVD present.  Cardiovascular: Normal rate, regular rhythm and normal heart sounds.  Exam reveals no gallop and no friction rub.   No murmur heard. Pulmonary/Chest: Effort normal and breath sounds normal. No respiratory distress. No wheezes.No rales.  Abdominal: Soft. Bowel sounds are normal. No distension. There is no tenderness. There is no guarding.  Musculoskeletal: No edema or tenderness.  Lymphadenopathy: No cervical, axillary or supraclavicular adenopathy.  Neurological: Alert and oriented to person, place, and time. No cranial nerve deficit.  Skin: Skin is warm and dry. No rash noted. No erythema. No pallor.  Psychiatric: Affect and judgment normal.   Labs Appointment on 09/22/2018  Component Date Value Ref Range Status  . WBC 09/22/2018 4.2  4.0 - 10.5 K/uL Final  . RBC 09/22/2018 2.89* 4.22 - 5.81 MIL/uL Final  . Hemoglobin 09/22/2018 8.2* 13.0 - 17.0 g/dL Final  . HCT 09/22/2018 27.0* 39.0 - 52.0 % Final  . MCV 09/22/2018 93.4  80.0 - 100.0 fL Final  . MCH 09/22/2018 28.4  26.0 - 34.0 pg  Final  . MCHC 09/22/2018 30.4  30.0 - 36.0 g/dL Final  . RDW 09/22/2018 17.6* 11.5 - 15.5 % Final  . Platelets 09/22/2018 277  150 - 400 K/uL Final  . nRBC 09/22/2018 0.0  0.0 - 0.2 % Final  . Neutrophils Relative % 09/22/2018 57  % Final  . Neutro Abs 09/22/2018 2.4  1.7 - 7.7 K/uL Final  . Lymphocytes Relative 09/22/2018 29  % Final  . Lymphs Abs 09/22/2018 1.2  0.7 - 4.0 K/uL Final  . Monocytes Relative 09/22/2018 9  % Final  . Monocytes Absolute 09/22/2018 0.4  0.1 - 1.0 K/uL Final  . Eosinophils Relative 09/22/2018 4  % Final  .  Eosinophils Absolute 09/22/2018 0.2  0.0 - 0.5 K/uL Final  . Basophils Relative 09/22/2018 1  % Final  . Basophils Absolute 09/22/2018 0.0  0.0 - 0.1 K/uL Final  . Immature Granulocytes 09/22/2018 0  % Final  . Abs Immature Granulocytes 09/22/2018 0.01  0.00 - 0.07 K/uL Final   Performed at [facility], [address], [place], [place] [address]  . Sodium 09/22/2018 136  135 - 145 mmol/L Final  . Potassium 09/22/2018 4.4  3.5 - 5.1 mmol/L Final  . Chloride 09/22/2018 105  98 - 111 mmol/L Final  . CO2 09/22/2018 23  22 - 32 mmol/L Final  . Glucose, Bld 09/22/2018 92  70 - 99 mg/dL Final  . BUN 09/22/2018 12  8 - 23 mg/dL Final  . Creatinine, Ser 09/22/2018 1.14  0.61 - 1.24 mg/dL Final  . Calcium 09/22/2018 9.1  8.9 - 10.3 mg/dL Final  . Total Protein 09/22/2018 7.2  6.5 - 8.1 g/dL Final  . Albumin 09/22/2018 3.7  3.5 - 5.0 g/dL Final  . AST 09/22/2018 15  15 - 41 U/L Final  . ALT 09/22/2018 7  0 - 44 U/L Final  . Alkaline Phosphatase 09/22/2018 57  38 - 126 U/L Final  . Total Bilirubin 09/22/2018 0.6  0.3 - 1.2 mg/dL Final  . GFR calc non Af Amer 09/22/2018 >60  >60 mL/min Final  . GFR calc Af Amer 09/22/2018 >60  >60 mL/min Final   Comment: (NOTE) The eGFR has been calculated using the CKD EPI equation. This calculation has not been validated in all clinical situations. eGFR's persistently <60 mL/min signify possible Chronic Kidney Disease.   Georgiann Hahn gap 09/22/2018 8  5 - 15 Final   Performed at [facility], [address] [place], [place] [address]     Pathology No orders of the defined types were placed in this encounter.      Zoila Shutter MD

## 2018-10-11 ENCOUNTER — Other Ambulatory Visit (HOSPITAL_COMMUNITY): Payer: Medicare Other

## 2018-10-11 ENCOUNTER — Encounter (INDEPENDENT_AMBULATORY_CARE_PROVIDER_SITE_OTHER): Payer: Self-pay | Admitting: *Deleted

## 2018-10-12 ENCOUNTER — Inpatient Hospital Stay (HOSPITAL_COMMUNITY): Payer: Medicare Other

## 2018-10-12 DIAGNOSIS — D649 Anemia, unspecified: Secondary | ICD-10-CM | POA: Diagnosis not present

## 2018-10-12 LAB — COMPREHENSIVE METABOLIC PANEL
ALT: 16 U/L (ref 0–44)
AST: 37 U/L (ref 15–41)
Albumin: 4 g/dL (ref 3.5–5.0)
Alkaline Phosphatase: 79 U/L (ref 38–126)
Anion gap: 9 (ref 5–15)
BUN: 13 mg/dL (ref 8–23)
CO2: 30 mmol/L (ref 22–32)
Calcium: 9.8 mg/dL (ref 8.9–10.3)
Chloride: 95 mmol/L — ABNORMAL LOW (ref 98–111)
Creatinine, Ser: 1.04 mg/dL (ref 0.61–1.24)
GFR calc Af Amer: >60 mL/min (ref >60)
GFR calc non Af Amer: >60 mL/min (ref >60)
Glucose, Bld: 134 mg/dL — ABNORMAL HIGH (ref 70–99)
Potassium: 3.5 mmol/L (ref 3.5–5.1)
Sodium: 134 mmol/L — ABNORMAL LOW (ref 135–145)
Total Bilirubin: 1.8 mg/dL — ABNORMAL HIGH (ref 0.3–1.2)
Total Protein: 7.6 g/dL (ref 6.5–8.1)

## 2018-10-12 LAB — LACTATE DEHYDROGENASE: LDH: 125 U/L (ref 98–192)

## 2018-10-12 LAB — CBC WITH DIFFERENTIAL/PLATELET
Abs Immature Granulocytes: 0.01 10*3/uL (ref 0.00–0.07)
Basophils Absolute: 0 10*3/uL (ref 0.0–0.1)
Basophils Relative: 1 %
Eosinophils Absolute: 0.2 10*3/uL (ref 0.0–0.5)
Eosinophils Relative: 3 %
HCT: 30.9 % — ABNORMAL LOW (ref 39.0–52.0)
Hemoglobin: 10 g/dL — ABNORMAL LOW (ref 13.0–17.0)
Immature Granulocytes: 0 %
Lymphocytes Relative: 31 %
Lymphs Abs: 1.6 10*3/uL (ref 0.7–4.0)
MCH: 28.6 pg (ref 26.0–34.0)
MCHC: 32.4 g/dL (ref 30.0–36.0)
MCV: 88.3 fL (ref 80.0–100.0)
Monocytes Absolute: 0.2 10*3/uL (ref 0.1–1.0)
Monocytes Relative: 4 %
Neutro Abs: 3.2 10*3/uL (ref 1.7–7.7)
Neutrophils Relative %: 61 %
Platelets: 278 10*3/uL (ref 150–400)
RBC: 3.5 MIL/uL — ABNORMAL LOW (ref 4.22–5.81)
RDW: 17.5 % — ABNORMAL HIGH (ref 11.5–15.5)
WBC: 5.3 10*3/uL (ref 4.0–10.5)
nRBC: 0.4 % — ABNORMAL HIGH (ref 0.0–0.2)

## 2018-10-12 LAB — FERRITIN: Ferritin: 218 ng/mL (ref 24–336)

## 2018-10-12 LAB — VITAMIN B12: Vitamin B-12: 670 pg/mL (ref 180–914)

## 2018-10-12 LAB — FOLATE: Folate: 10.4 ng/mL (ref >5.9)

## 2018-10-13 LAB — PROTEIN ELECTROPHORESIS, SERUM
A/G Ratio: 1.2 (ref 0.7–1.7)
Albumin ELP: 3.8 g/dL (ref 2.9–4.4)
Alpha-1-Globulin: 0.2 g/dL (ref 0.0–0.4)
Alpha-2-Globulin: 0.6 g/dL (ref 0.4–1.0)
Beta Globulin: 1 g/dL (ref 0.7–1.3)
Gamma Globulin: 1.4 g/dL (ref 0.4–1.8)
Globulin, Total: 3.1 g/dL (ref 2.2–3.9)
Total Protein ELP: 6.9 g/dL (ref 6.0–8.5)

## 2018-10-13 LAB — HAPTOGLOBIN: Haptoglobin: 67 mg/dL (ref 34–200)

## 2018-10-13 LAB — HEMOGLOBINOPATHY EVALUATION
Hgb A2 Quant: 1.6 % — ABNORMAL LOW (ref 1.8–3.2)
Hgb A: 98.4 % (ref 96.4–98.8)
Hgb C: 0 %
Hgb F Quant: 0 % (ref 0.0–2.0)
Hgb S Quant: 0 %
Hgb Variant: 0 %

## 2018-10-14 LAB — METHYLMALONIC ACID, SERUM: Methylmalonic Acid, Quantitative: 329 nmol/L (ref 0–378)

## 2018-10-18 ENCOUNTER — Ambulatory Visit (HOSPITAL_COMMUNITY): Payer: Medicare Other | Admitting: Internal Medicine

## 2018-10-22 ENCOUNTER — Ambulatory Visit (HOSPITAL_COMMUNITY): Payer: Medicare Other | Admitting: Internal Medicine

## 2018-10-28 DIAGNOSIS — E161 Other hypoglycemia: Secondary | ICD-10-CM | POA: Diagnosis not present

## 2018-10-28 DIAGNOSIS — E162 Hypoglycemia, unspecified: Secondary | ICD-10-CM | POA: Diagnosis not present

## 2018-10-28 DIAGNOSIS — R404 Transient alteration of awareness: Secondary | ICD-10-CM | POA: Diagnosis not present

## 2018-10-28 DIAGNOSIS — I959 Hypotension, unspecified: Secondary | ICD-10-CM | POA: Diagnosis not present

## 2018-10-28 DIAGNOSIS — R4020 Unspecified coma: Secondary | ICD-10-CM | POA: Diagnosis not present

## 2018-10-28 DIAGNOSIS — R739 Hyperglycemia, unspecified: Secondary | ICD-10-CM | POA: Diagnosis not present

## 2018-10-28 DIAGNOSIS — E11649 Type 2 diabetes mellitus with hypoglycemia without coma: Secondary | ICD-10-CM | POA: Diagnosis not present

## 2018-10-28 DIAGNOSIS — G9389 Other specified disorders of brain: Secondary | ICD-10-CM | POA: Diagnosis not present

## 2018-10-28 DIAGNOSIS — E872 Acidosis: Secondary | ICD-10-CM | POA: Diagnosis not present

## 2018-11-17 DIAGNOSIS — F4312 Post-traumatic stress disorder, chronic: Secondary | ICD-10-CM | POA: Diagnosis not present

## 2018-11-17 DIAGNOSIS — F332 Major depressive disorder, recurrent severe without psychotic features: Secondary | ICD-10-CM | POA: Diagnosis not present

## 2018-11-18 DIAGNOSIS — I10 Essential (primary) hypertension: Secondary | ICD-10-CM | POA: Diagnosis not present

## 2018-11-18 DIAGNOSIS — Z6829 Body mass index (BMI) 29.0-29.9, adult: Secondary | ICD-10-CM | POA: Diagnosis not present

## 2018-11-18 DIAGNOSIS — J019 Acute sinusitis, unspecified: Secondary | ICD-10-CM | POA: Diagnosis not present

## 2018-11-18 DIAGNOSIS — I5032 Chronic diastolic (congestive) heart failure: Secondary | ICD-10-CM | POA: Diagnosis not present

## 2018-11-25 ENCOUNTER — Encounter: Payer: Self-pay | Admitting: Student

## 2018-11-25 ENCOUNTER — Ambulatory Visit (INDEPENDENT_AMBULATORY_CARE_PROVIDER_SITE_OTHER): Payer: Medicare Other | Admitting: Student

## 2018-11-25 VITALS — BP 132/84 | HR 81 | Ht 65.0 in | Wt 181.0 lb

## 2018-11-25 DIAGNOSIS — R0789 Other chest pain: Secondary | ICD-10-CM

## 2018-11-25 DIAGNOSIS — I10 Essential (primary) hypertension: Secondary | ICD-10-CM

## 2018-11-25 DIAGNOSIS — I429 Cardiomyopathy, unspecified: Secondary | ICD-10-CM

## 2018-11-25 DIAGNOSIS — D649 Anemia, unspecified: Secondary | ICD-10-CM

## 2018-11-25 NOTE — Progress Notes (Signed)
Cardiology Office Note    Date:  11/25/2018   ID:  Roger Hansen, DOB 24-Aug-1946, MRN [id number]  PCP:  Curlene Labrum, MD  Cardiologist: Kate Sable, MD    Chief Complaint  Patient presents with  . Follow-up    2 month visit    History of Present Illness:    Roger Hansen is a 72 y.o. male with past medical history of HTN, HLD, chronic anemia and alcohol abuse who presents to the office today for 2-month follow-up.  He was last examined by Dr. Bronson Ing in 09/2018 as a new patient referral for chest pain. He had recently undergone stress testing at [facility] in 08/2018 which showed no reversible ischemia or infarction. He reported at the time of his visit having chest pain which could occur at rest or with exertion and would last for several minutes at a time. While his pain was overall felt to be atypical, his EF had noted to be decreased by stress testing, therefore an echocardiogram was recommended for further evaluation. Echocardiogram was obtained on 09/23/2018 and showed mild to moderate LVH, EF of 50%, and probable hypokinesis of the basal inferior lateral and inferior myocardium.  He did have mild AI and trivial TR.  In talking with the patient today, he reports overall doing well from a cardiac perspective since his last office visit. He denies any recurrent episodes of chest pain since having quit consuming alcohol approximately 6 weeks ago. Denies any recent dyspnea on exertion, orthopnea, PND, lower extremity edema, or palpitations.  He does report having quit consuming alcohol in the past but would revert back to drinking due to his PTSD. The patient and his wife are currently raising two of their grandchildren as one of his daughters died earlier this year from an overdose. He notes that life is stressful at times and he is planning to attend AA meetings or sign up for alcohol rehabilitation through the [place] as he has done in the past. Denies any SI or HI.    Past Medical History:  Diagnosis Date  . Alcohol abuse 11/24/2016  . Anemia 10/11/2013  . Chronic renal disease, stage 3, moderately decreased glomerular filtration rate (GFR) between 30-59 mL/min/1.73 square meter (HCC) 12/02/2016  . Colon polyps   . GERD (gastroesophageal reflux disease)   . High cholesterol   . Hypertension   . Sinus infection     Past Surgical History:  Procedure Laterality Date  . COLONOSCOPY N/A 10/26/2013   Procedure: COLONOSCOPY;  Surgeon: Rogene Houston, MD;  [facility];  Service: Endoscopy;  Laterality: N/A;  325-moved to 1230 Ann to notify pt  . Colonoscopy with polypectomy      Current Medications: Outpatient Medications Prior to Visit  Medication Sig Dispense Refill  . aspirin 81 MG tablet Take 81 mg by mouth daily.    Marland Kitchen atorvastatin (LIPITOR) 10 MG tablet Take 10 mg by mouth daily.    Marland Kitchen buPROPion (ZYBAN) 150 MG 12 hr tablet Take 150 mg by mouth 2 (two) times daily.    . cholecalciferol (VITAMIN D) 1000 UNITS tablet Take 1,000 Units by mouth 2 (two) times daily.    . ferrous sulfate 325 (65 FE) MG tablet Take 1 tablet (325 mg total) by mouth daily with breakfast. 30 tablet 3  . folic acid (FOLVITE) 1 MG tablet Take 1 mg by mouth daily.    Marland Kitchen gabapentin (NEURONTIN) 300 MG capsule Take 300 mg by mouth 3 (three) times  daily as needed.    Marland Kitchen losartan (COZAAR) 50 MG tablet Take 50 mg by mouth daily.    Marland Kitchen omeprazole (PRILOSEC) 20 MG capsule Take 20 mg by mouth daily as needed (Acid Reflux).    . potassium chloride (K-DUR) 10 MEQ tablet Take 20 mEq by mouth 2 (two) times daily.    . traZODone (DESYREL) 50 MG tablet Take 50 mg by mouth at bedtime.    . vitamin B-12 (CYANOCOBALAMIN) 1000 MCG tablet Take 1,000 mcg by mouth daily.     No facility-administered medications prior to visit.      Allergies:   Horseradish [armoracia rusticana ext (horseradish)]   Social History   Socioeconomic History  . Marital status: Married    Spouse name:  Not on file  . Number of children: Not on file  . Years of education: Not on file  . Highest education level: Not on file  Occupational History  . Not on file  Social Needs  . Financial resource strain: Not on file  . Food insecurity:    Worry: Not on file    Inability: Not on file  . Transportation needs:    Medical: Not on file    Non-medical: Not on file  Tobacco Use  . Smoking status: Former Smoker    Packs/day: 0.50    Years: 20.00    Pack years: 10.00    Last attempt to quit: 12/27/1971    Years since quitting: 46.9  . Smokeless tobacco: Never Used  Substance and Sexual Activity  . Alcohol use: Yes    Comment: 1 pint of etoh a day ( not recently)  . Drug use: No  . Sexual activity: Not on file  Lifestyle  . Physical activity:    Days per week: Not on file    Minutes per session: Not on file  . Stress: Not on file  Relationships  . Social connections:    Talks on phone: Not on file    Gets together: Not on file    Attends religious service: Not on file    Active member of club or organization: Not on file    Attends meetings of clubs or organizations: Not on file    Relationship status: Not on file  Other Topics Concern  . Not on file  Social History Narrative  . Not on file     Family History:  The patient's family history includes Hypertension in his mother.   Review of Systems:   Please see the history of present illness.     General:  No chills, fever, night sweats or weight changes.  Cardiovascular:  No chest pain, dyspnea on exertion, edema, orthopnea, palpitations, paroxysmal nocturnal dyspnea. Dermatological: No rash, lesions/masses Respiratory: No cough, dyspnea Urologic: No hematuria, dysuria Abdominal:   No nausea, vomiting, diarrhea, bright red blood per rectum, melena, or hematemesis Neurologic:  No visual changes, wkns, changes in mental status.  He denies any of the above symptoms.   All other systems reviewed and are otherwise negative  except as noted above.   Physical Exam:    VS:  BP 132/84   Pulse 81   Ht 5\' 5"  (1.651 m)   Wt 181 lb (82.1 kg)   SpO2 94%   BMI 30.12 kg/m    General: Well developed, well nourished [place] American male appearing in no acute distress. Head: Normocephalic, atraumatic, sclera non-icteric, no xanthomas, nares are without discharge.  Neck: No carotid bruits. JVD not elevated.  Lungs: Respirations  regular and unlabored, without wheezes or rales.  Heart: Regular rate and rhythm. No S3 or S4.  No murmur, no rubs, or gallops appreciated. Abdomen: Soft, non-tender, non-distended with normoactive bowel sounds. No hepatomegaly. No rebound/guarding. No obvious abdominal masses. Msk:  Strength and tone appear normal for age. No joint deformities or effusions. Extremities: No clubbing or cyanosis. No lower extremity edema.  Distal pedal pulses are 2+ bilaterally. Neuro: Alert and oriented X 3. Moves all extremities spontaneously. No focal deficits noted. Psych:  Responds to questions appropriately with a normal affect. Skin: No rashes or lesions noted  Wt Readings from Last 3 Encounters:  11/25/18 181 lb (82.1 kg)  09/24/18 168 lb (76.2 kg)  09/21/18 169 lb (76.7 kg)     Studies/Labs Reviewed:   EKG:  EKG is not ordered today.   Recent Labs: 10/12/2018: ALT 16; BUN 13; Creatinine, Ser 1.04; Hemoglobin 10.0; Platelets 278; Potassium 3.5; Sodium 134   Lipid Panel No results found for: CHOL, TRIG, HDL, CHOLHDL, VLDL, LDLCALC, LDLDIRECT  Additional studies/ records that were reviewed today include:   NST: 08/2018   Echocardiogram: 09/23/2018 Study Conclusions  - Left ventricle: The cavity size was normal. Wall thickness was   increased increased in a pattern of mild to moderate LVH. The   estimated ejection fraction was 50%. Probable hypokinesis of the   basalinferolateral and inferior myocardium. Indeterminate   diastolic function. - Aortic valve: Mildly calcified annulus.  Trileaflet. There was   mild regurgitation. - Mitral valve: There was mild regurgitation. - Right atrium: Central venous pressure (est): 3 mm Hg. - Atrial septum: No defect or patent foramen ovale was identified. - Tricuspid valve: There was trivial regurgitation. - Pulmonary arteries: Systolic pressure could not be accurately   estimated. - Pericardium, extracardiac: There was no pericardial effusion.  Assessment:    1. Atypical chest pain   2. Cardiomyopathy, unspecified type ([place])   3. Essential hypertension   4. Normocytic anemia      Plan:   In order of problems listed above:  1. Atypical Chest Pain - He was previously having episodes of chest pain several months ago and stress testing showed no evidence of reversible ischemia or prior infarction. He reports having quit consuming alcohol 6 weeks ago and his chest discomfort resolved with this which raises the concern for a GI component. He has baseline anemia but denies any recent melena or hematochezia. Remains on PPI therapy. - Given his stress test results and resolved symptoms, no indication for further ischemic evaluation at this time.   2. Cardiomyopathy - EF previously read as 48% by NST in 08/2018 with echocardiogram showing EF mildly reduced at 50% with probable hypokinesis and mild to moderate LVH as outlined above. Would suspect that his cardiomyopathy is alcohol induced in the setting of having consumed a pint to 1/5 of liquor on a daily basis for over the past 30+ years. He does report having quit consuming alcohol 6 weeks ago. Continued cessation was strongly advised.  - Given recent NST showed no significant ischemia, would not plan for further ischemic evaluation at this time and he denies any recent chest pain or dyspnea on exertion. Sodium and fluid restriction reviewed.  Continue with ARB. Can consider the addition of BB therapy in the future pending BP response as he was recently restarted on Losartan.   3.  HTN - BP initially elevated at 146/84, improved to 132/84. He was encouraged to follow this in the ambulatory setting. Continue Losartan  50 mg daily.  4. Normocytic Anemia - Followed by Hematology. Hemoglobin stable at 10.0 when checked in 09/2018. He denies any evidence of active bleeding. Remains on B12 and Folate supplementation.   Medication Adjustments/Labs and Tests Ordered: Current medicines are reviewed at length with the patient today.  Concerns regarding medicines are outlined above.  Medication changes, Labs and Tests ordered today are listed in the Patient Instructions below. Patient Instructions  Medication Instructions:  Your physician recommends that you continue on your current medications as directed. Please refer to the Current Medication list given to you today.  If you need a refill on your cardiac medications before your next appointment, please call your pharmacy.   Lab work: NONE  If you have labs (blood work) drawn today and your tests are completely normal, you will receive your results only by: Marland Kitchen MyChart Message (if you have MyChart) OR . A paper copy in the mail If you have any lab test that is abnormal or we need to change your treatment, we will call you to review the results.  Testing/Procedures: NONE   Follow-Up: At [facility], you and your health needs are our priority.  As part of our continuing mission to provide you with exceptional heart care, we have created designated Provider Care Teams.  These Care Teams include your primary Cardiologist (physician) and Advanced Practice Providers (APPs -  Physician Assistants and Nurse Practitioners) who all work together to provide you with the care you need, when you need it. You will need a follow up appointment in 6 months.  Please call our office 2 months in advance to schedule this appointment.  You may see Kate Sable, MD or one of the following Advanced Practice Providers on your designated Care  Team:   Bernerd Pho, PA-C [facility]) . Ermalinda Barrios, PA-C ([place])  Any Other Special Instructions Will Be Listed Below (If Applicable). Thank you for choosing [facility]!   Signed, Erma Heritage, PA-C  11/25/2018 5:37 PM    [address] [address] [place], [place] [address] Phone: [phone]

## 2018-11-25 NOTE — Patient Instructions (Signed)
Medication Instructions:  Your physician recommends that you continue on your current medications as directed. Please refer to the Current Medication list given to you today.  If you need a refill on your cardiac medications before your next appointment, please call your pharmacy.   Lab work: NONE  If you have labs (blood work) drawn today and your tests are completely normal, you will receive your results only by: Marland Kitchen MyChart Message (if you have MyChart) OR . A paper copy in the mail If you have any lab test that is abnormal or we need to change your treatment, we will call you to review the results.  Testing/Procedures: NONE   Follow-Up: At [facility], you and your health needs are our priority.  As part of our continuing mission to provide you with exceptional heart care, we have created designated Provider Care Teams.  These Care Teams include your primary Cardiologist (physician) and Advanced Practice Providers (APPs -  Physician Assistants and Nurse Practitioners) who all work together to provide you with the care you need, when you need it. You will need a follow up appointment in 6 months.  Please call our office 2 months in advance to schedule this appointment.  You may see Kate Sable, MD or one of the following Advanced Practice Providers on your designated Care Team:   Bernerd Pho, PA-C [facility]) . Ermalinda Barrios, PA-C ([facility])  Any Other Special Instructions Will Be Listed Below (If Applicable). Thank you for choosing [facility]!     DASH Eating Plan DASH stands for "Dietary Approaches to Stop Hypertension." The DASH eating plan is a healthy eating plan that has been shown to reduce high blood pressure (hypertension). It may also reduce your risk for type 2 diabetes, heart disease, and stroke. The DASH eating plan may also help with weight loss. What are tips for following this plan? General guidelines  Avoid eating more than  2,300 mg (milligrams) of salt (sodium) a day. If you have hypertension, you may need to reduce your sodium intake to 1,500 mg a day.  Limit alcohol intake to no more than 1 drink a day for nonpregnant women and 2 drinks a day for men. One drink equals 12 oz of beer, 5 oz of wine, or 1 oz of hard liquor.  Work with your health care provider to maintain a healthy body weight or to lose weight. Ask what an ideal weight is for you.  Get at least 30 minutes of exercise that causes your heart to beat faster (aerobic exercise) most days of the week. Activities may include walking, swimming, or biking.  Work with your health care provider or diet and nutrition specialist (dietitian) to adjust your eating plan to your individual calorie needs. Reading food labels  Check food labels for the amount of sodium per serving. Choose foods with less than 5 percent of the Daily Value of sodium. Generally, foods with less than 300 mg of sodium per serving fit into this eating plan.  To find whole grains, look for the word "whole" as the first word in the ingredient list. Shopping  Buy products labeled as "low-sodium" or "no salt added."  Buy fresh foods. Avoid canned foods and premade or frozen meals. Cooking  Avoid adding salt when cooking. Use salt-free seasonings or herbs instead of table salt or sea salt. Check with your health care provider or pharmacist before using salt substitutes.  Do not fry foods. Cook foods using healthy methods such as  baking, boiling, grilling, and broiling instead.  Cook with heart-healthy oils, such as olive, canola, soybean, or sunflower oil. Meal planning   Eat a balanced diet that includes: ? 5 or more servings of fruits and vegetables each day. At each meal, try to fill half of your plate with fruits and vegetables. ? Up to 6-8 servings of whole grains each day. ? Less than 6 oz of lean meat, poultry, or fish each day. A 3-oz serving of meat is about the same size  as a deck of cards. One egg equals 1 oz. ? 2 servings of low-fat dairy each day. ? A serving of nuts, seeds, or beans 5 times each week. ? Heart-healthy fats. Healthy fats called Omega-3 fatty acids are found in foods such as flaxseeds and coldwater fish, like sardines, salmon, and mackerel.  Limit how much you eat of the following: ? Canned or prepackaged foods. ? Food that is high in trans fat, such as fried foods. ? Food that is high in saturated fat, such as fatty meat. ? Sweets, desserts, sugary drinks, and other foods with added sugar. ? Full-fat dairy products.  Do not salt foods before eating.  Try to eat at least 2 vegetarian meals each week.  Eat more home-cooked food and less restaurant, buffet, and fast food.  When eating at a restaurant, ask that your food be prepared with less salt or no salt, if possible. What foods are recommended? The items listed may not be a complete list. Talk with your dietitian about what dietary choices are best for you. Grains Whole-grain or whole-wheat bread. Whole-grain or whole-wheat pasta. Brown rice. [place]. Bulgur. Whole-grain and low-sodium cereals. Pita bread. Low-fat, low-sodium crackers. Whole-wheat flour tortillas. Vegetables Fresh or frozen vegetables (raw, steamed, roasted, or grilled). Low-sodium or reduced-sodium tomato and vegetable juice. Low-sodium or reduced-sodium tomato sauce and tomato paste. Low-sodium or reduced-sodium canned vegetables. Fruits All fresh, dried, or frozen fruit. Canned fruit in natural juice (without added sugar). Meat and other protein foods Skinless chicken or [place]. Ground chicken or [place]. Pork with fat trimmed off. Fish and seafood. Egg whites. Dried beans, peas, or lentils. Unsalted nuts, nut butters, and seeds. Unsalted canned beans. Lean cuts of beef with fat trimmed off. Low-sodium, lean deli meat. Dairy Low-fat (1%) or fat-free (skim) milk. Fat-free, low-fat, or reduced-fat cheeses.  Nonfat, low-sodium ricotta or cottage cheese. Low-fat or nonfat yogurt. Low-fat, low-sodium cheese. Fats and oils Soft margarine without trans fats. Vegetable oil. Low-fat, reduced-fat, or light mayonnaise and salad dressings (reduced-sodium). Canola, safflower, olive, soybean, and sunflower oils. Avocado. Seasoning and other foods Herbs. Spices. Seasoning mixes without salt. Unsalted popcorn and pretzels. Fat-free sweets. What foods are not recommended? The items listed may not be a complete list. Talk with your dietitian about what dietary choices are best for you. Grains Baked goods made with fat, such as croissants, muffins, or some breads. Dry pasta or rice meal packs. Vegetables Creamed or fried vegetables. Vegetables in a cheese sauce. Regular canned vegetables (not low-sodium or reduced-sodium). Regular canned tomato sauce and paste (not low-sodium or reduced-sodium). Regular tomato and vegetable juice (not low-sodium or reduced-sodium). Angie Fava. Olives. Fruits Canned fruit in a light or heavy syrup. Fried fruit. Fruit in cream or butter sauce. Meat and other protein foods Fatty cuts of meat. Ribs. Fried meat. Berniece Salines. Sausage. Bologna and other processed lunch meats. Salami. Fatback. Hotdogs. Bratwurst. Salted nuts and seeds. Canned beans with added salt. Canned or smoked fish. Whole eggs or egg  yolks. Chicken or [place] with skin. Dairy Whole or 2% milk, cream, and half-and-half. Whole or full-fat cream cheese. Whole-fat or sweetened yogurt. Full-fat cheese. Nondairy creamers. Whipped toppings. Processed cheese and cheese spreads. Fats and oils Butter. Stick margarine. Lard. Shortening. Ghee. Bacon fat. Tropical oils, such as coconut, palm kernel, or palm oil. Seasoning and other foods Salted popcorn and pretzels. Onion salt, garlic salt, seasoned salt, table salt, and sea salt. Worcestershire sauce. Tartar sauce. Barbecue sauce. Teriyaki sauce. Soy sauce, including reduced-sodium. Steak  sauce. Canned and packaged gravies. Fish sauce. Oyster sauce. Cocktail sauce. Horseradish that you find on the shelf. Ketchup. Mustard. Meat flavorings and tenderizers. Bouillon cubes. Hot sauce and Tabasco sauce. Premade or packaged marinades. Premade or packaged taco seasonings. Relishes. Regular salad dressings. Where to find more information:  National Heart, Lung, and [place]: https://wilson-eaton.com/  American Heart Association: www.heart.org Summary  The DASH eating plan is a healthy eating plan that has been shown to reduce high blood pressure (hypertension). It may also reduce your risk for type 2 diabetes, heart disease, and stroke.  With the DASH eating plan, you should limit salt (sodium) intake to 2,300 mg a day. If you have hypertension, you may need to reduce your sodium intake to 1,500 mg a day.  When on the DASH eating plan, aim to eat more fresh fruits and vegetables, whole grains, lean proteins, low-fat dairy, and heart-healthy fats.  Work with your health care provider or diet and nutrition specialist (dietitian) to adjust your eating plan to your individual calorie needs. This information is not intended to replace advice given to you by your health care provider. Make sure you discuss any questions you have with your health care provider. Document Released: 11/20/2011 Document Revised: 11/24/2016 Document Reviewed: 11/24/2016 Elsevier Interactive Patient Education  [employer].

## 2018-12-03 ENCOUNTER — Ambulatory Visit: Payer: Medicare Other | Admitting: Cardiovascular Disease

## 2018-12-27 DIAGNOSIS — K219 Gastro-esophageal reflux disease without esophagitis: Secondary | ICD-10-CM | POA: Diagnosis not present

## 2018-12-27 DIAGNOSIS — N183 Chronic kidney disease, stage 3 (moderate): Secondary | ICD-10-CM | POA: Diagnosis not present

## 2018-12-27 DIAGNOSIS — D529 Folate deficiency anemia, unspecified: Secondary | ICD-10-CM | POA: Diagnosis not present

## 2018-12-27 DIAGNOSIS — I10 Essential (primary) hypertension: Secondary | ICD-10-CM | POA: Diagnosis not present

## 2018-12-27 DIAGNOSIS — D649 Anemia, unspecified: Secondary | ICD-10-CM | POA: Diagnosis not present

## 2018-12-27 DIAGNOSIS — D519 Vitamin B12 deficiency anemia, unspecified: Secondary | ICD-10-CM | POA: Diagnosis not present

## 2019-02-23 DIAGNOSIS — I5032 Chronic diastolic (congestive) heart failure: Secondary | ICD-10-CM | POA: Diagnosis not present

## 2019-02-23 DIAGNOSIS — R600 Localized edema: Secondary | ICD-10-CM | POA: Diagnosis not present

## 2019-02-23 DIAGNOSIS — I700 Atherosclerosis of aorta: Secondary | ICD-10-CM | POA: Diagnosis not present

## 2019-02-23 DIAGNOSIS — N183 Chronic kidney disease, stage 3 (moderate): Secondary | ICD-10-CM | POA: Diagnosis not present

## 2019-02-23 DIAGNOSIS — Z6829 Body mass index (BMI) 29.0-29.9, adult: Secondary | ICD-10-CM | POA: Diagnosis not present

## 2019-02-23 DIAGNOSIS — H02841 Edema of right upper eyelid: Secondary | ICD-10-CM | POA: Diagnosis not present

## 2019-02-23 DIAGNOSIS — I426 Alcoholic cardiomyopathy: Secondary | ICD-10-CM | POA: Diagnosis not present

## 2019-02-23 DIAGNOSIS — I10 Essential (primary) hypertension: Secondary | ICD-10-CM | POA: Diagnosis not present

## 2019-03-22 DIAGNOSIS — D649 Anemia, unspecified: Secondary | ICD-10-CM | POA: Diagnosis not present

## 2019-03-22 DIAGNOSIS — E559 Vitamin D deficiency, unspecified: Secondary | ICD-10-CM | POA: Diagnosis not present

## 2019-03-22 DIAGNOSIS — D519 Vitamin B12 deficiency anemia, unspecified: Secondary | ICD-10-CM | POA: Diagnosis not present

## 2019-03-22 DIAGNOSIS — R634 Abnormal weight loss: Secondary | ICD-10-CM | POA: Diagnosis not present

## 2019-03-22 DIAGNOSIS — K219 Gastro-esophageal reflux disease without esophagitis: Secondary | ICD-10-CM | POA: Diagnosis not present

## 2019-03-22 DIAGNOSIS — N183 Chronic kidney disease, stage 3 (moderate): Secondary | ICD-10-CM | POA: Diagnosis not present

## 2019-03-22 DIAGNOSIS — I10 Essential (primary) hypertension: Secondary | ICD-10-CM | POA: Diagnosis not present

## 2019-03-22 DIAGNOSIS — E782 Mixed hyperlipidemia: Secondary | ICD-10-CM | POA: Diagnosis not present

## 2019-05-12 DIAGNOSIS — K921 Melena: Secondary | ICD-10-CM | POA: Diagnosis not present

## 2019-05-12 DIAGNOSIS — R1013 Epigastric pain: Secondary | ICD-10-CM | POA: Diagnosis not present

## 2019-05-24 ENCOUNTER — Other Ambulatory Visit: Payer: Self-pay

## 2019-05-25 ENCOUNTER — Inpatient Hospital Stay (HOSPITAL_COMMUNITY): Payer: Medicare Other

## 2019-05-25 ENCOUNTER — Inpatient Hospital Stay (HOSPITAL_COMMUNITY): Payer: Medicare Other | Attending: Hematology | Admitting: Nurse Practitioner

## 2019-05-25 DIAGNOSIS — I10 Essential (primary) hypertension: Secondary | ICD-10-CM | POA: Diagnosis not present

## 2019-05-25 DIAGNOSIS — Z7982 Long term (current) use of aspirin: Secondary | ICD-10-CM | POA: Insufficient documentation

## 2019-05-25 DIAGNOSIS — D649 Anemia, unspecified: Secondary | ICD-10-CM

## 2019-05-25 DIAGNOSIS — F1010 Alcohol abuse, uncomplicated: Secondary | ICD-10-CM | POA: Insufficient documentation

## 2019-05-25 DIAGNOSIS — N183 Chronic kidney disease, stage 3 (moderate): Secondary | ICD-10-CM | POA: Diagnosis present

## 2019-05-25 DIAGNOSIS — Z87891 Personal history of nicotine dependence: Secondary | ICD-10-CM | POA: Diagnosis not present

## 2019-05-25 DIAGNOSIS — E7800 Pure hypercholesterolemia, unspecified: Secondary | ICD-10-CM | POA: Insufficient documentation

## 2019-05-25 DIAGNOSIS — Z79899 Other long term (current) drug therapy: Secondary | ICD-10-CM | POA: Diagnosis not present

## 2019-05-25 DIAGNOSIS — D631 Anemia in chronic kidney disease: Secondary | ICD-10-CM | POA: Diagnosis present

## 2019-05-25 LAB — COMPREHENSIVE METABOLIC PANEL
ALT: 11 U/L (ref 0–44)
AST: 18 U/L (ref 15–41)
Albumin: 3.5 g/dL (ref 3.5–5.0)
Alkaline Phosphatase: 53 U/L (ref 38–126)
Anion gap: 11 (ref 5–15)
BUN: 11 mg/dL (ref 8–23)
CO2: 25 mmol/L (ref 22–32)
Calcium: 8.9 mg/dL (ref 8.9–10.3)
Chloride: 104 mmol/L (ref 98–111)
Creatinine, Ser: 0.94 mg/dL (ref 0.61–1.24)
GFR calc Af Amer: >60 mL/min (ref >60)
GFR calc non Af Amer: >60 mL/min (ref >60)
Glucose, Bld: 89 mg/dL (ref 70–99)
Potassium: 4 mmol/L (ref 3.5–5.1)
Sodium: 140 mmol/L (ref 135–145)
Total Bilirubin: 0.8 mg/dL (ref 0.3–1.2)
Total Protein: 6.6 g/dL (ref 6.5–8.1)

## 2019-05-25 LAB — CBC WITH DIFFERENTIAL/PLATELET
Abs Immature Granulocytes: 0.02 10*3/uL (ref 0.00–0.07)
Basophils Absolute: 0 10*3/uL (ref 0.0–0.1)
Basophils Relative: 1 %
Eosinophils Absolute: 0.1 10*3/uL (ref 0.0–0.5)
Eosinophils Relative: 2 %
HCT: 24.7 % — ABNORMAL LOW (ref 39.0–52.0)
Hemoglobin: 7.7 g/dL — ABNORMAL LOW (ref 13.0–17.0)
Immature Granulocytes: 0 %
Lymphocytes Relative: 30 %
Lymphs Abs: 1.6 10*3/uL (ref 0.7–4.0)
MCH: 28.7 pg (ref 26.0–34.0)
MCHC: 31.2 g/dL (ref 30.0–36.0)
MCV: 92.2 fL (ref 80.0–100.0)
Monocytes Absolute: 0.6 10*3/uL (ref 0.1–1.0)
Monocytes Relative: 10 %
Neutro Abs: 3.2 10*3/uL (ref 1.7–7.7)
Neutrophils Relative %: 57 %
Platelets: 318 10*3/uL (ref 150–400)
RBC: 2.68 MIL/uL — ABNORMAL LOW (ref 4.22–5.81)
RDW: 18.8 % — ABNORMAL HIGH (ref 11.5–15.5)
WBC: 5.5 10*3/uL (ref 4.0–10.5)
nRBC: 0 % (ref 0.0–0.2)

## 2019-05-25 LAB — IRON AND TIBC
Iron: 39 ug/dL — ABNORMAL LOW (ref 45–182)
Saturation Ratios: 17 % — ABNORMAL LOW (ref 17.9–39.5)
TIBC: 231 ug/dL — ABNORMAL LOW (ref 250–450)
UIBC: 192 ug/dL

## 2019-05-25 LAB — FERRITIN: Ferritin: 136 ng/mL (ref 24–336)

## 2019-05-25 LAB — FOLATE: Folate: 12.5 ng/mL (ref >5.9)

## 2019-05-25 LAB — VITAMIN B12: Vitamin B-12: 407 pg/mL (ref 180–914)

## 2019-05-25 LAB — LACTATE DEHYDROGENASE: LDH: 141 U/L (ref 98–192)

## 2019-05-25 NOTE — Progress Notes (Signed)
[address] [place], [place] [address]   CLINIC:  Medical Oncology/Hematology  PCP:  Curlene Labrum, MD [place] [phone]   REASON FOR VISIT: Follow-up for anemia  CURRENT THERAPY: Oral iron supplementation   INTERVAL HISTORY:  Roger Hansen 73 y.o. male returns for routine follow-up for anemia.  He reports he is a little more fatigued than normal but still able to do all of his normal activities.  He does occasionally have headaches and dizziness. Denies any nausea, vomiting, or diarrhea. Denies any new pains. Had not noticed any recent bleeding such as epistaxis, hematuria or hematochezia. Denies recent chest pain on exertion, shortness of breath on minimal exertion, pre-syncopal episodes, or palpitations. Denies any numbness or tingling in hands or feet. Denies any recent fevers, infections, or recent hospitalizations. Patient reports appetite at 50 % and energy level at 50 %.  He is eating well and maintaining his weight at this time.    REVIEW OF SYSTEMS:  Review of Systems  Gastrointestinal: Positive for diarrhea, nausea and vomiting.  Musculoskeletal: Positive for gait problem.  Neurological: Positive for dizziness and gait problem.  All other systems reviewed and are negative.    PAST MEDICAL/SURGICAL HISTORY:  Past Medical History:  Diagnosis Date  . Alcohol abuse 11/24/2016  . Anemia 10/11/2013  . Chronic renal disease, stage 3, moderately decreased glomerular filtration rate (GFR) between 30-59 mL/min/1.73 square meter (HCC) 12/02/2016  . Colon polyps   . GERD (gastroesophageal reflux disease)   . High cholesterol   . Hypertension   . Sinus infection    Past Surgical History:  Procedure Laterality Date  . COLONOSCOPY N/A 10/26/2013   Procedure: COLONOSCOPY;  Surgeon: Rogene Houston, MD;  Location: [facility];  Service: Endoscopy;  Laterality: N/A;  325-moved to 1230 Ann to notify pt  . Colonoscopy  with polypectomy       SOCIAL HISTORY:  Social History   Socioeconomic History  . Marital status: Married    Spouse name: Not on file  . Number of children: Not on file  . Years of education: Not on file  . Highest education level: Not on file  Occupational History  . Not on file  Social Needs  . Financial resource strain: Not on file  . Food insecurity:    Worry: Not on file    Inability: Not on file  . Transportation needs:    Medical: Not on file    Non-medical: Not on file  Tobacco Use  . Smoking status: Former Smoker    Packs/day: 0.50    Years: 20.00    Pack years: 10.00    Last attempt to quit: 12/27/1971    Years since quitting: 47.4  . Smokeless tobacco: Never Used  Substance and Sexual Activity  . Alcohol use: Yes    Comment: 1 pint of etoh a day ( not recently)  . Drug use: No  . Sexual activity: Not on file  Lifestyle  . Physical activity:    Days per week: Not on file    Minutes per session: Not on file  . Stress: Not on file  Relationships  . Social connections:    Talks on phone: Not on file    Gets together: Not on file    Attends religious service: Not on file    Active member of club or organization: Not on file    Attends meetings of clubs or organizations: Not  on file    Relationship status: Not on file  . Intimate partner violence:    Fear of current or ex partner: Not on file    Emotionally abused: Not on file    Physically abused: Not on file    Forced sexual activity: Not on file  Other Topics Concern  . Not on file  Social History Narrative  . Not on file    FAMILY HISTORY:  Family History  Problem Relation Age of Onset  . Hypertension Mother   . Colon cancer Neg Hx     CURRENT MEDICATIONS:  Outpatient Encounter Medications as of 05/25/2019  Medication Sig Note  . aspirin 81 MG tablet Take 81 mg by mouth daily.   Marland Kitchen atorvastatin (LIPITOR) 10 MG tablet Take 10 mg by mouth daily.   Marland Kitchen buPROPion (ZYBAN) 150 MG 12 hr tablet Take  150 mg by mouth 2 (two) times daily.   . cholecalciferol (VITAMIN D) 1000 UNITS tablet Take 1,000 Units by mouth 2 (two) times daily.   . ferrous sulfate 325 (65 FE) MG tablet Take 1 tablet (325 mg total) by mouth daily with breakfast.   . folic acid (FOLVITE) 1 MG tablet Take 1 mg by mouth daily.   Marland Kitchen gabapentin (NEURONTIN) 300 MG capsule Take 300 mg by mouth 3 (three) times daily as needed. 10/21/2016: Received from: External Pharmacy  . losartan (COZAAR) 50 MG tablet Take 50 mg by mouth daily.   Marland Kitchen omeprazole (PRILOSEC) 20 MG capsule Take 20 mg by mouth daily as needed (Acid Reflux).   . potassium chloride (K-DUR) 10 MEQ tablet Take 20 mEq by mouth 2 (two) times daily.   . traZODone (DESYREL) 50 MG tablet Take 50 mg by mouth at bedtime.   . vitamin B-12 (CYANOCOBALAMIN) 1000 MCG tablet Take 1,000 mcg by mouth daily.    No facility-administered encounter medications on file as of 05/25/2019.     ALLERGIES:  Allergies  Allergen Reactions  . Horseradish [Armoracia Rusticana Ext (Horseradish)] Anaphylaxis     PHYSICAL EXAM:  ECOG Performance status: 1  Vitals:   05/25/19 0908  BP: 139/66  Pulse: 83  Resp: 18  Temp: 99 F (37.2 C)  SpO2: 100%   Filed Weights   05/25/19 0908  Weight: 171 lb 3.2 oz (77.7 kg)    Physical Exam Constitutional:      Appearance: Normal appearance. He is normal weight.  Cardiovascular:     Rate and Rhythm: Normal rate and regular rhythm.     Heart sounds: Normal heart sounds.  Pulmonary:     Effort: Pulmonary effort is normal.     Breath sounds: Normal breath sounds.  Abdominal:     General: Bowel sounds are normal.     Palpations: Abdomen is soft.  Musculoskeletal: Normal range of motion.  Skin:    General: Skin is warm and dry.  Neurological:     Mental Status: He is alert and oriented to person, place, and time. Mental status is at baseline.  Psychiatric:        Mood and Affect: Mood normal.        Behavior: Behavior normal.         Thought Content: Thought content normal.        Judgment: Judgment normal.      LABORATORY DATA:  I have reviewed the labs as listed.  CBC    Component Value Date/Time   WBC 5.5 05/25/2019 1018   RBC 2.68 (L) 05/25/2019 1018  HGB 7.7 (L) 05/25/2019 1018   HCT 24.7 (L) 05/25/2019 1018   HCT 27.9 (L) 08/13/2016 0944   PLT 318 05/25/2019 1018   MCV 92.2 05/25/2019 1018   MCH 28.7 05/25/2019 1018   MCHC 31.2 05/25/2019 1018   RDW 18.8 (H) 05/25/2019 1018   LYMPHSABS 1.6 05/25/2019 1018   MONOABS 0.6 05/25/2019 1018   EOSABS 0.1 05/25/2019 1018   BASOSABS 0.0 05/25/2019 1018   CMP Latest Ref Rng & Units 05/25/2019 10/12/2018 09/22/2018  Glucose 70 - 99 mg/dL 89 134(H) 92  BUN 8 - 23 mg/dL 11 13 12   Creatinine 0.61 - 1.24 mg/dL 0.94 1.04 1.14  Sodium 135 - 145 mmol/L 140 134(L) 136  Potassium 3.5 - 5.1 mmol/L 4.0 3.5 4.4  Chloride 98 - 111 mmol/L 104 95(L) 105  CO2 22 - 32 mmol/L 25 30 23   Calcium 8.9 - 10.3 mg/dL 8.9 9.8 9.1  Total Protein 6.5 - 8.1 g/dL 6.6 7.6 7.2  Total Bilirubin 0.3 - 1.2 mg/dL 0.8 1.8(H) 0.6  Alkaline Phos 38 - 126 U/L 53 79 57  AST 15 - 41 U/L 18 37 15  ALT 0 - 44 U/L 11 16 7      I personally performed a face-to-face visit.  All questions were answered to patient's stated satisfaction. Encouraged patient to call with any new concerns or questions before his next visit to the cancer center and we can certain see him sooner, if needed.     ASSESSMENT & PLAN:   Anemia 1.  Normocytic normochromic anemia: - It is felt that his chronic alcohol abuse is felt to be the contributing factor to his anemia given its toxicity on the bone marrow. - His anemia dates back to at least 2014 with a negative peripheral work-up, negative bone marrow aspiration and biopsy and cytogenetics on 10/16/2016 in the setting of alcohol abuse. -It was reported that he was on iron tablets in the past and tolerated fairly well.  He was placed back on oral iron.  He is taking  without problems. -Labs on 05/25/2019 showed his hemoglobin is 7.7, ferritin 136, percent saturation 17, B12 407, and platelets 318. - He is a Jehovah witness and does not receive blood products.  He does except platelets but his platelets are good at 318. - We will give him 2 infusions of IV Feraheme. -We will also start him on Procrit injections 20,000 units every week. - He will follow-up in 3 weeks with repeat labs.  2.  EtOH abuse: - It has been previously discussed several times regarding AA. -Patient will follow-up with his PCP      Orders placed this encounter:  Orders Placed This Encounter  Procedures  . Lactate dehydrogenase  . CBC with Differential/Platelet  . Comprehensive metabolic panel  . Ferritin  . Iron and TIBC  . Vitamin B12  . VITAMIN D 25 Hydroxy (Vit-D Deficiency, Fractures)  . Folate      Francene Finders, FNP-C [facility] [phone]

## 2019-05-25 NOTE — Assessment & Plan Note (Addendum)
1.  Normocytic normochromic anemia: - It is felt that his chronic alcohol abuse is felt to be the contributing factor to his anemia given its toxicity on the bone marrow. - His anemia dates back to at least 2014 with a negative peripheral work-up, negative bone marrow aspiration and biopsy and cytogenetics on 10/16/2016 in the setting of alcohol abuse. -It was reported that he was on iron tablets in the past and tolerated fairly well.  He was placed back on oral iron.  He is taking without problems. -Labs on 05/25/2019 showed his hemoglobin is 7.7, ferritin 136, percent saturation 17, B12 407, and platelets 318. - He is a Jehovah witness and does not receive blood products.  He does except platelets but his platelets are good at 318. - We will give him 2 infusions of IV Feraheme. -We will also start him on Procrit injections 20,000 units every week. - He will follow-up in 3 weeks with repeat labs.  2.  EtOH abuse: - It has been previously discussed several times regarding AA. -Patient will follow-up with his PCP

## 2019-05-25 NOTE — Addendum Note (Signed)
Addended by: Glennie Isle on: 05/25/2019 03:15 PM   Modules accepted: Orders

## 2019-05-26 ENCOUNTER — Encounter (INDEPENDENT_AMBULATORY_CARE_PROVIDER_SITE_OTHER): Payer: Self-pay | Admitting: Internal Medicine

## 2019-05-26 ENCOUNTER — Ambulatory Visit (INDEPENDENT_AMBULATORY_CARE_PROVIDER_SITE_OTHER): Payer: Medicare Other | Admitting: Internal Medicine

## 2019-05-26 ENCOUNTER — Encounter (INDEPENDENT_AMBULATORY_CARE_PROVIDER_SITE_OTHER): Payer: Self-pay | Admitting: *Deleted

## 2019-05-26 ENCOUNTER — Inpatient Hospital Stay (HOSPITAL_COMMUNITY): Payer: Medicare Other

## 2019-05-26 ENCOUNTER — Other Ambulatory Visit: Payer: Self-pay

## 2019-05-26 ENCOUNTER — Telehealth (INDEPENDENT_AMBULATORY_CARE_PROVIDER_SITE_OTHER): Payer: Self-pay | Admitting: *Deleted

## 2019-05-26 VITALS — BP 139/79 | HR 82 | Temp 98.2°F | Ht 65.0 in | Wt 172.0 lb

## 2019-05-26 VITALS — BP 111/63 | HR 72 | Temp 98.4°F | Resp 18

## 2019-05-26 DIAGNOSIS — R195 Other fecal abnormalities: Secondary | ICD-10-CM

## 2019-05-26 DIAGNOSIS — D500 Iron deficiency anemia secondary to blood loss (chronic): Secondary | ICD-10-CM

## 2019-05-26 DIAGNOSIS — D649 Anemia, unspecified: Secondary | ICD-10-CM

## 2019-05-26 DIAGNOSIS — N183 Chronic kidney disease, stage 3 (moderate): Secondary | ICD-10-CM | POA: Diagnosis not present

## 2019-05-26 LAB — VITAMIN D 25 HYDROXY (VIT D DEFICIENCY, FRACTURES): Vit D, 25-Hydroxy: 36.5 ng/mL (ref 30.0–100.0)

## 2019-05-26 MED ORDER — SODIUM CHLORIDE 0.9 % IV SOLN
510.0000 mg | Freq: Once | INTRAVENOUS | Status: AC
  Administered 2019-05-26: 510 mg via INTRAVENOUS
  Filled 2019-05-26: qty 510

## 2019-05-26 MED ORDER — PEG 3350-KCL-NA BICARB-NACL 420 G PO SOLR: 4000.0000 mL | Freq: Once | ORAL | 0 refills | Status: AC

## 2019-05-26 MED ORDER — SODIUM CHLORIDE 0.9 % IV SOLN
Freq: Once | INTRAVENOUS | Status: AC
  Administered 2019-05-26: 14:00:00 via INTRAVENOUS

## 2019-05-26 NOTE — Progress Notes (Signed)
Subjective:    Patient ID: Roger Hansen, male    DOB: [birth year], 73 y.o.   MRN: [id number]  HPI Referred by Dr. Pleas Koch for blood in stool. Recent hemoglobin 05/19/2019 revealed hemoglobin 6.8. Is followed by the [facility] for his anemia.  Hx of anemia per epic at least back in 2014. He tells me today his stools are very loose, and slimy like for about a month. No recent antibiotics. He says he had not seen any blood. His appetite is okay. Has had weight loss. Weight in 2018 was 159. Today his weight is 172.  He was drinking a pint a day or more. He may not drink a pint a week now.  Received Iron infusion today.   05/25/2019 Hemoglobin 7.7.  05/25/2019 ferritin 136, Iron 39, TIBC 232, Sat ratio 17, UIBC 192, vitamin B12 407, folate 12.5 01/26/2013:Procedure: Colonoscopyd  Indications:Patient is 73 year old [place] American male with history of colonic adenomas who is here for surveillance colonoscopy. His last exam was in June 2011 with removal of multiple polyps which were tubular adenomas and 1 with high-grade dysplasia. Family history is negative for CRC.  Impression:  Normal mucosa of terminal ileum. Two small polyps were cold snared and submitted together. These were located at cecum and splenic flexure. Small external hemorrhoids.  Patient had 2 polyps removed and these are tubular adenomas. He had multiple adenomas removed on prior colonoscopy as well. Next colonoscopy in 5 years.   Review of Systems. Past Medical History:  Diagnosis Date  . Alcohol abuse 11/24/2016  . Anemia 10/11/2013  . Chronic renal disease, stage 3, moderately decreased glomerular filtration rate (GFR) between 30-59 mL/min/1.73 square meter (HCC) 12/02/2016  . Colon polyps   . GERD (gastroesophageal reflux disease)   . High cholesterol   . Hypertension   . Sinus infection     Past Surgical History:  Procedure Laterality Date  . COLONOSCOPY N/A 10/26/2013   Procedure:  COLONOSCOPY;  Surgeon: Rogene Houston, MD;  Location[facility];  Service: Endoscopy;  Laterality: N/A;  325-moved to 1230 Ann to notify pt  . Colonoscopy with polypectomy      Allergies  Allergen Reactions  . Horseradish [Armoracia Rusticana Ext (Horseradish)] Anaphylaxis    Current Outpatient Medications on File Prior to Visit  Medication Sig Dispense Refill  . aspirin 81 MG tablet Take 81 mg by mouth daily.    Marland Kitchen atorvastatin (LIPITOR) 10 MG tablet Take 10 mg by mouth daily.    Marland Kitchen buPROPion (ZYBAN) 150 MG 12 hr tablet Take 150 mg by mouth 2 (two) times daily.    . busPIRone (BUSPAR) 10 MG tablet Take 10 mg by mouth 3 (three) times daily.    . cholecalciferol (VITAMIN D) 1000 UNITS tablet Take 1,000 Units by mouth 2 (two) times daily.    . ferrous sulfate 325 (65 FE) MG tablet Take 1 tablet (325 mg total) by mouth daily with breakfast. 30 tablet 3  . folic acid (FOLVITE) 1 MG tablet Take 1 mg by mouth daily.    Marland Kitchen gabapentin (NEURONTIN) 300 MG capsule Take 300 mg by mouth 3 (three) times daily as needed.    Marland Kitchen losartan (COZAAR) 50 MG tablet Take 50 mg by mouth daily.    Marland Kitchen omeprazole (PRILOSEC) 20 MG capsule Take 20 mg by mouth daily as needed (Acid Reflux).    . potassium chloride (K-DUR) 10 MEQ tablet Take 20 mEq by mouth 2 (two) times daily.    Marland Kitchen  traZODone (DESYREL) 50 MG tablet Take 50 mg by mouth at bedtime.    . vitamin B-12 (CYANOCOBALAMIN) 1000 MCG tablet Take 1,000 mcg by mouth daily.     No current facility-administered medications on file prior to visit.         Objective:   Physical Exam Blood pressure 139/79, pulse 82, temperature 98.2 F (36.8 C), height 5\' 5"  (1.651 m), weight 172 lb (78 kg). Alert and oriented. Skin warm and dry. Oral mucosa is moist.   . Sclera anicteric, conjunctivae is pink. Thyroid not enlarged. No cervical lymphadenopathy. Lungs clear. Heart regular rate and rhythm.  Abdomen is soft. Bowel sounds are positive. No hepatomegaly. No abdominal  masses felt. No tenderness.  No edema to lower extremities. Stool brown and guaiac positive.           Assessment & Plan:  IDA. Colonic polyps. Colonic neoplasm needs to be ruled out. Colonoscopy, possible EGD.  The risks of bleeding, perforation and infection were reviewed with patient.

## 2019-05-26 NOTE — Telephone Encounter (Signed)
Patient needs trilyte 

## 2019-05-26 NOTE — Progress Notes (Signed)
Patient to treatment area for iron infusion.  Written information given with side effects reviewed with all questions asked and answered.  No s/s of distress noted.   Patient tolerated iron infusion with no complaints voiced.  Peripheral IV with good blood return noted before and after infusion.  No bruising or swelling noted at site.  Band aid applied.  Patient had an appointment with GI doctor and asked to be let go a few minutes early.  VSS with discharge and left ambulatory with no s/s of distress noted.

## 2019-05-26 NOTE — Patient Instructions (Signed)
The risks of bleeding, perforation and infection were reviewed with patient.  

## 2019-05-27 ENCOUNTER — Encounter (HOSPITAL_COMMUNITY): Payer: Self-pay

## 2019-05-27 ENCOUNTER — Inpatient Hospital Stay (HOSPITAL_COMMUNITY): Payer: Medicare Other

## 2019-05-27 VITALS — BP 126/54 | HR 82 | Temp 99.1°F | Resp 18

## 2019-05-27 DIAGNOSIS — D649 Anemia, unspecified: Secondary | ICD-10-CM

## 2019-05-27 DIAGNOSIS — N183 Chronic kidney disease, stage 3 (moderate): Secondary | ICD-10-CM | POA: Diagnosis not present

## 2019-05-27 LAB — CBC
HCT: 26.1 % — ABNORMAL LOW (ref 39.0–52.0)
Hemoglobin: 7.9 g/dL — ABNORMAL LOW (ref 13.0–17.0)
MCH: 28.6 pg (ref 26.0–34.0)
MCHC: 30.3 g/dL (ref 30.0–36.0)
MCV: 94.6 fL (ref 80.0–100.0)
Platelets: 321 10*3/uL (ref 150–400)
RBC: 2.76 MIL/uL — ABNORMAL LOW (ref 4.22–5.81)
RDW: 18.8 % — ABNORMAL HIGH (ref 11.5–15.5)
WBC: 6 10*3/uL (ref 4.0–10.5)
nRBC: 0 % (ref 0.0–0.2)

## 2019-05-27 MED ORDER — EPOETIN ALFA 20000 UNIT/ML IJ SOLN
INTRAMUSCULAR | Status: AC
  Filled 2019-05-27: qty 1

## 2019-05-27 MED ORDER — EPOETIN ALFA 20000 UNIT/ML IJ SOLN
20000.0000 [IU] | Freq: Once | INTRAMUSCULAR | Status: AC
  Administered 2019-05-27: 20000 [IU] via SUBCUTANEOUS

## 2019-05-27 NOTE — Progress Notes (Signed)
Roger Hansen tolerated Procrit injection well without complaints or incident. Hgb 7.9 today. VSS Pt discharged self ambulatory in satisfactory condition

## 2019-05-27 NOTE — Patient Instructions (Signed)
[place] [facility] Discharge Instructions  Received Procrit injection today   Thank you for choosing [place] at [facility] to provide your oncology and hematology care.  To afford each patient quality time with our provider, please arrive at least 15 minutes before your scheduled appointment time.   If you have a lab appointment with the [facility] please come in thru the  Main Entrance and check in at the main information desk  You need to re-schedule your appointment should you arrive 10 or more minutes late.  We strive to give you quality time with our providers, and arriving late affects you and other patients whose appointments are after yours.  Also, if you no show three or more times for appointments you may be dismissed from the clinic at the providers discretion.     Again, thank you for choosing [facility].  Our hope is that these requests will decrease the amount of time that you wait before being seen by our physicians.       _____________________________________________________________  Should you have questions after your visit to [facility], please contact our office at [phone] between the hours of 8:00 a.m. and 4:30 p.m.  Voicemails left after 4:00 p.m. will not be returned until the following business day.  For prescription refill requests, have your pharmacy contact our office and allow 72 hours.    Cancer Center Support Programs:   > Cancer Support Group  2nd Tuesday of the month 1pm-2pm, Journey Room

## 2019-05-30 ENCOUNTER — Telehealth (INDEPENDENT_AMBULATORY_CARE_PROVIDER_SITE_OTHER): Payer: Self-pay | Admitting: Internal Medicine

## 2019-05-30 ENCOUNTER — Other Ambulatory Visit (INDEPENDENT_AMBULATORY_CARE_PROVIDER_SITE_OTHER): Payer: Self-pay | Admitting: *Deleted

## 2019-05-30 DIAGNOSIS — D508 Other iron deficiency anemias: Secondary | ICD-10-CM

## 2019-05-30 DIAGNOSIS — R195 Other fecal abnormalities: Secondary | ICD-10-CM

## 2019-05-30 DIAGNOSIS — D509 Iron deficiency anemia, unspecified: Secondary | ICD-10-CM

## 2019-05-30 MED ORDER — GOLYTELY 236 G PO SOLR: 4000.0000 mL | Freq: Once | ORAL | 0 refills | Status: AC

## 2019-05-30 NOTE — Telephone Encounter (Signed)
I advised patient to stop the iron and Dr. Laural Golden is aware patient is on iron.

## 2019-05-30 NOTE — Telephone Encounter (Signed)
Rx sent to his pharmacy

## 2019-05-30 NOTE — Telephone Encounter (Signed)
I spoke with patient. We are going to try and have the colonoscopy moved up to this Friday.  Dr. Laural Golden is aware.

## 2019-05-31 ENCOUNTER — Other Ambulatory Visit: Payer: Self-pay

## 2019-05-31 ENCOUNTER — Other Ambulatory Visit: Payer: Medicare Other

## 2019-05-31 ENCOUNTER — Other Ambulatory Visit (HOSPITAL_COMMUNITY)
Admission: RE | Admit: 2019-05-31 | Discharge: 2019-05-31 | Disposition: A | Discharge status: Home / Self Care | Payer: Medicare Other | Source: Ambulatory Visit | Attending: Internal Medicine | Admitting: Internal Medicine

## 2019-05-31 ENCOUNTER — Other Ambulatory Visit (INDEPENDENT_AMBULATORY_CARE_PROVIDER_SITE_OTHER): Payer: Self-pay | Admitting: *Deleted

## 2019-05-31 ENCOUNTER — Encounter (HOSPITAL_COMMUNITY): Payer: Self-pay

## 2019-05-31 ENCOUNTER — Encounter (HOSPITAL_COMMUNITY)
Admission: RE | Admit: 2019-05-31 | Discharge: 2019-05-31 | Disposition: A | Discharge status: Home / Self Care | Payer: Medicare Other | Source: Ambulatory Visit | Attending: Internal Medicine | Admitting: Internal Medicine

## 2019-05-31 DIAGNOSIS — Z1159 Encounter for screening for other viral diseases: Secondary | ICD-10-CM | POA: Insufficient documentation

## 2019-05-31 DIAGNOSIS — D509 Iron deficiency anemia, unspecified: Secondary | ICD-10-CM | POA: Insufficient documentation

## 2019-06-01 LAB — NOVEL CORONAVIRUS, NAA (HOSP ORDER, SEND-OUT TO REF LAB; TAT 18-24 HRS): SARS-CoV-2, NAA: NOT DETECTED

## 2019-06-02 ENCOUNTER — Inpatient Hospital Stay (HOSPITAL_COMMUNITY): Payer: Medicare Other

## 2019-06-02 ENCOUNTER — Other Ambulatory Visit: Payer: Self-pay

## 2019-06-02 ENCOUNTER — Encounter (HOSPITAL_COMMUNITY): Payer: Self-pay

## 2019-06-02 VITALS — BP 140/69 | HR 66 | Temp 98.3°F | Resp 18

## 2019-06-02 DIAGNOSIS — N183 Chronic kidney disease, stage 3 (moderate): Secondary | ICD-10-CM | POA: Diagnosis not present

## 2019-06-02 DIAGNOSIS — D649 Anemia, unspecified: Secondary | ICD-10-CM

## 2019-06-02 MED ORDER — SODIUM CHLORIDE 0.9 % IV SOLN
510.0000 mg | Freq: Once | INTRAVENOUS | Status: AC
  Administered 2019-06-02: 15:00:00 510 mg via INTRAVENOUS
  Filled 2019-06-02: qty 510

## 2019-06-02 MED ORDER — SODIUM CHLORIDE 0.9 % IV SOLN
Freq: Once | INTRAVENOUS | Status: AC
  Administered 2019-06-02: 14:00:00 via INTRAVENOUS

## 2019-06-02 NOTE — Progress Notes (Signed)
Feraheme given today per MD orders. Tolerated infusion without adverse affects. Vital signs stable. No complaints at this time. Discharged from clinic ambulatory. F/U with [facility] as scheduled.  

## 2019-06-03 ENCOUNTER — Ambulatory Visit (HOSPITAL_COMMUNITY): Payer: Medicare Other | Admitting: Anesthesiology

## 2019-06-03 ENCOUNTER — Other Ambulatory Visit: Payer: Self-pay

## 2019-06-03 ENCOUNTER — Encounter (HOSPITAL_COMMUNITY): Payer: Self-pay

## 2019-06-03 ENCOUNTER — Other Ambulatory Visit (HOSPITAL_COMMUNITY): Payer: Medicare Other

## 2019-06-03 ENCOUNTER — Ambulatory Visit (HOSPITAL_COMMUNITY): Payer: Medicare Other

## 2019-06-03 ENCOUNTER — Inpatient Hospital Stay (HOSPITAL_COMMUNITY): Payer: Medicare Other

## 2019-06-03 ENCOUNTER — Ambulatory Visit (HOSPITAL_COMMUNITY)
Admission: RE | Admit: 2019-06-03 | Discharge: 2019-06-03 | Disposition: A | Discharge status: Home / Self Care | Payer: Medicare Other | Attending: Internal Medicine | Admitting: Internal Medicine

## 2019-06-03 ENCOUNTER — Encounter (HOSPITAL_COMMUNITY): Payer: Self-pay | Admitting: Anesthesiology

## 2019-06-03 ENCOUNTER — Encounter (HOSPITAL_COMMUNITY)
Admission: RE | Disposition: A | Discharge status: Home / Self Care | Payer: Self-pay | Source: Home / Self Care | Attending: Internal Medicine

## 2019-06-03 VITALS — BP 138/69 | HR 68 | Temp 98.6°F | Resp 18

## 2019-06-03 DIAGNOSIS — E7800 Pure hypercholesterolemia, unspecified: Secondary | ICD-10-CM | POA: Diagnosis not present

## 2019-06-03 DIAGNOSIS — Z91018 Allergy to other foods: Secondary | ICD-10-CM | POA: Diagnosis not present

## 2019-06-03 DIAGNOSIS — D631 Anemia in chronic kidney disease: Secondary | ICD-10-CM | POA: Diagnosis not present

## 2019-06-03 DIAGNOSIS — Z79899 Other long term (current) drug therapy: Secondary | ICD-10-CM | POA: Diagnosis not present

## 2019-06-03 DIAGNOSIS — Z8249 Family history of ischemic heart disease and other diseases of the circulatory system: Secondary | ICD-10-CM | POA: Insufficient documentation

## 2019-06-03 DIAGNOSIS — Z86010 Personal history of colonic polyps: Secondary | ICD-10-CM | POA: Insufficient documentation

## 2019-06-03 DIAGNOSIS — N183 Chronic kidney disease, stage 3 (moderate): Secondary | ICD-10-CM | POA: Diagnosis not present

## 2019-06-03 DIAGNOSIS — K209 Esophagitis, unspecified: Secondary | ICD-10-CM | POA: Diagnosis not present

## 2019-06-03 DIAGNOSIS — Z7982 Long term (current) use of aspirin: Secondary | ICD-10-CM | POA: Insufficient documentation

## 2019-06-03 DIAGNOSIS — K210 Gastro-esophageal reflux disease with esophagitis: Secondary | ICD-10-CM | POA: Insufficient documentation

## 2019-06-03 DIAGNOSIS — Z87891 Personal history of nicotine dependence: Secondary | ICD-10-CM | POA: Insufficient documentation

## 2019-06-03 DIAGNOSIS — K635 Polyp of colon: Secondary | ICD-10-CM | POA: Diagnosis not present

## 2019-06-03 DIAGNOSIS — D509 Iron deficiency anemia, unspecified: Secondary | ICD-10-CM | POA: Insufficient documentation

## 2019-06-03 DIAGNOSIS — K2980 Duodenitis without bleeding: Secondary | ICD-10-CM | POA: Insufficient documentation

## 2019-06-03 DIAGNOSIS — D123 Benign neoplasm of transverse colon: Secondary | ICD-10-CM | POA: Diagnosis not present

## 2019-06-03 DIAGNOSIS — R1310 Dysphagia, unspecified: Secondary | ICD-10-CM | POA: Diagnosis not present

## 2019-06-03 DIAGNOSIS — K644 Residual hemorrhoidal skin tags: Secondary | ICD-10-CM | POA: Diagnosis not present

## 2019-06-03 DIAGNOSIS — D649 Anemia, unspecified: Secondary | ICD-10-CM

## 2019-06-03 DIAGNOSIS — D500 Iron deficiency anemia secondary to blood loss (chronic): Secondary | ICD-10-CM | POA: Diagnosis not present

## 2019-06-03 DIAGNOSIS — I129 Hypertensive chronic kidney disease with stage 1 through stage 4 chronic kidney disease, or unspecified chronic kidney disease: Secondary | ICD-10-CM | POA: Diagnosis not present

## 2019-06-03 DIAGNOSIS — D122 Benign neoplasm of ascending colon: Secondary | ICD-10-CM | POA: Insufficient documentation

## 2019-06-03 DIAGNOSIS — R195 Other fecal abnormalities: Secondary | ICD-10-CM | POA: Insufficient documentation

## 2019-06-03 HISTORY — PX: POLYPECTOMY: SHX5525

## 2019-06-03 HISTORY — PX: BIOPSY: SHX5522

## 2019-06-03 HISTORY — PX: COLONOSCOPY WITH PROPOFOL: SHX5780

## 2019-06-03 HISTORY — PX: ESOPHAGOGASTRODUODENOSCOPY (EGD) WITH PROPOFOL: SHX5813

## 2019-06-03 LAB — CBC
HCT: 33.5 % — ABNORMAL LOW (ref 39.0–52.0)
Hemoglobin: 9.9 g/dL — ABNORMAL LOW (ref 13.0–17.0)
MCH: 28.1 pg (ref 26.0–34.0)
MCHC: 29.6 g/dL — ABNORMAL LOW (ref 30.0–36.0)
MCV: 95.2 fL (ref 80.0–100.0)
Platelets: 235 10*3/uL (ref 150–400)
RBC: 3.52 MIL/uL — ABNORMAL LOW (ref 4.22–5.81)
RDW: 19.9 % — ABNORMAL HIGH (ref 11.5–15.5)
WBC: 4.5 10*3/uL (ref 4.0–10.5)
nRBC: 0 % (ref 0.0–0.2)

## 2019-06-03 SURGERY — COLONOSCOPY WITH PROPOFOL
Anesthesia: General

## 2019-06-03 MED ORDER — LACTATED RINGERS IV SOLN: INTRAVENOUS | Status: DC

## 2019-06-03 MED ORDER — PROPOFOL 500 MG/50ML IV EMUL
INTRAVENOUS | Status: DC | PRN
  Administered 2019-06-03: 10:00:00 via INTRAVENOUS
  Administered 2019-06-03: 100 ug/kg/min via INTRAVENOUS
  Administered 2019-06-03: 150 ug/kg/min via INTRAVENOUS

## 2019-06-03 MED ORDER — LACTATED RINGERS IV SOLN
INTRAVENOUS | Status: DC | PRN
  Administered 2019-06-03: 10:00:00 via INTRAVENOUS

## 2019-06-03 MED ORDER — HYDROCODONE-ACETAMINOPHEN 7.5-325 MG PO TABS: 1.0000 | ORAL_TABLET | Freq: Once | ORAL | Status: DC | PRN

## 2019-06-03 MED ORDER — PROMETHAZINE HCL 25 MG/ML IJ SOLN: 6.2500 mg | INTRAMUSCULAR | Status: DC | PRN

## 2019-06-03 MED ORDER — KETAMINE HCL 10 MG/ML IJ SOLN
INTRAMUSCULAR | Status: DC | PRN
  Administered 2019-06-03: 20 mg via INTRAVENOUS

## 2019-06-03 MED ORDER — HYDROMORPHONE HCL 1 MG/ML IJ SOLN: 0.2500 mg | INTRAMUSCULAR | Status: DC | PRN

## 2019-06-03 MED ORDER — PROPOFOL 10 MG/ML IV BOLUS
INTRAVENOUS | Status: AC
  Filled 2019-06-03: qty 40

## 2019-06-03 MED ORDER — EPOETIN ALFA 20000 UNIT/ML IJ SOLN
20000.0000 [IU] | Freq: Once | INTRAMUSCULAR | Status: AC
  Administered 2019-06-03: 20000 [IU] via SUBCUTANEOUS
  Filled 2019-06-03: qty 1

## 2019-06-03 MED ORDER — KETAMINE HCL 50 MG/5ML IJ SOSY
PREFILLED_SYRINGE | INTRAMUSCULAR | Status: AC
  Filled 2019-06-03: qty 5

## 2019-06-03 MED ORDER — MIDAZOLAM HCL 2 MG/2ML IJ SOLN: 0.5000 mg | Freq: Once | INTRAMUSCULAR | Status: DC | PRN

## 2019-06-03 MED ORDER — PROPOFOL 10 MG/ML IV BOLUS
INTRAVENOUS | Status: DC | PRN
  Administered 2019-06-03: 30 mg via INTRAVENOUS
  Administered 2019-06-03 (×3): 20 mg via INTRAVENOUS
  Administered 2019-06-03: 30 mg via INTRAVENOUS

## 2019-06-03 NOTE — Progress Notes (Signed)
Patient tolerated injection with no complaints voiced.  Site clean and dry with no bruising or swelling noted at site.  Band aid applied.  Vss with discharge and left ambulatory with no s/s of distress noted.  

## 2019-06-03 NOTE — Discharge Instructions (Signed)
Resume aspirin on 06/05/2019. Resume other medications including ferrous sulfate as before. Resume usual diet. No driving for 24 hours. Physician will call with results of biopsy and blood test.   Iron Deficiency Anemia, Adult Iron-deficiency anemia is when you have a low amount of red blood cells or hemoglobin. This happens because you have too little iron in your body. Hemoglobin carries oxygen to parts of the body. Anemia can cause your body to not get enough oxygen. It may or may not cause symptoms. Follow these instructions at home: Medicines  Take over-the-counter and prescription medicines only as told by your doctor. This includes iron pills (supplements) and vitamins.  If you cannot handle taking iron pills by mouth, ask your doctor about getting iron through: ? A vein (intravenously). ? A shot (injection) into a muscle.  Take iron pills when your stomach is empty. If you cannot handle this, take them with food.  Do not drink milk or take antacids at the same time as your iron pills.  To prevent trouble pooping (constipation), eat fiber or take medicine (stool softener) as told by your doctor. Eating and drinking   Talk with your doctor before changing the foods you eat. He or she may tell you to eat foods that have a lot of iron, such as: ? Liver. ? Lowfat (lean) beef. ? Breads and cereals that have iron added to them (fortified breads and cereals). ? Eggs. ? Dried fruit. ? Dark green, leafy vegetables.  Drink enough fluid to keep your pee (urine) clear or pale yellow.  Eat fresh fruits and vegetables that are high in vitamin C. They help your body to use iron. Foods with a lot of vitamin C include: ? Oranges. ? Peppers. ? Tomatoes. ? Mangoes. General instructions  Return to your normal activities as told by your doctor. Ask your doctor what activities are safe for you.  Keep yourself clean, and keep things clean around you (your surroundings). Anemia can make  you get sick more easily.  Keep all follow-up visits as told by your doctor. This is important. Contact a doctor if:  You feel sick to your stomach (nauseous).  You throw up (vomit).  You feel weak.  You are sweating for no clear reason.  You have trouble pooping, such as: ? Pooping (having a bowel movement) less than 3 times a week. ? Straining to poop. ? Having poop that is hard, dry, or larger than normal. ? Feeling full or bloated. ? Pain in the lower belly. ? Not feeling better after pooping. Get help right away if:  You pass out (faint). If this happens, do not drive yourself to the hospital. Call your local emergency services (911 in the [place]  You have chest pain.  You have shortness of breath that: ? Is very bad. ? Gets worse with physical activity.  You have a fast heartbeat.  You get light-headed when getting up from sitting or lying down. This information is not intended to replace advice given to you by your health care provider. Make sure you discuss any questions you have with your health care provider. Document Released: 01/03/2011 Document Revised: 08/20/2016 Document Reviewed: 08/20/2016 Elsevier Interactive Patient Education  2019 [employer].

## 2019-06-03 NOTE — Op Note (Signed)
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 Sharon Seller, RN, Raphael Gibney, Technician Referring MD:             Curlene Labrum, MD Medicines:                Propofol per Anesthesia Complications:            No immediate complications. Estimated Blood Loss:     Estimated blood loss was minimal. Procedure:                Pre-Anesthesia Assessment:                           - Prior to the procedure, a History and Physical                            was performed, and patient medications and                            allergies were reviewed. The patient's tolerance of                            previous anesthesia was also reviewed. The risks                            and benefits of the procedure and the sedation                            options and risks were discussed with the patient.                            All questions were answered, and informed consent                            was obtained. Prior Anticoagulants: The patient has                            taken no previous anticoagulant or antiplatelet                            agents except for aspirin. ASA Grade Assessment:  III - A patient with severe systemic disease. After                            reviewing the risks and benefits, the patient was                            deemed in satisfactory condition to undergo the                            procedure.                           After obtaining informed consent, the endoscope was                            passed under direct vision. Throughout  the                            procedure, the patient's blood pressure, pulse, and                            oxygen saturations were monitored continuously. The                            GIF-H190 ([id number]) was introduced through the                            mouth, and advanced to the second part of duodenum.                            The upper GI endoscopy was accomplished without                            difficulty. The patient tolerated the procedure                            well. Scope In: 9:43:10 AM Scope Out: 9:58:22 AM Total Procedure Duration: 0 hours 15 minutes 12 seconds  Findings:      The proximal esophagus and distal esophagus were normal.      LA Grade A (one or more mucosal breaks less than 5 mm, not extending       between tops of 2 mucosal folds) esophagitis with no bleeding was found       29 to 35 cm from the incisors. Biopsies were taken with a cold forceps       for histology. The pathology specimen was placed into Bottle Number 1.      The Z-line was regular and was found 40 cm from the incisors.      The entire examined stomach was normal.      Patchy mild inflammation characterized by congestion (edema), erosions,       erythema and friability was found in the duodenal bulb and in the second       portion of the duodenum. Impression:               - Normal proximal esophagus and  distal esophagus.                           - LA Grade A esophagitis. Biopsied.                           - Z-line regular, 40 cm from the incisors.                           - Normal stomach.                           - Duodenitis. Moderate Sedation:      Per Anesthesia Care Recommendation:           - Patient has a contact number available for                            emergencies. The signs and symptoms of potential                            delayed complications were discussed with the                            patient. Return to normal activities tomorrow.                             Written discharge instructions were provided to the                            patient.                           - Resume previous diet today.                           - Continue present medications.                           - No aspirin, ibuprofen, naproxen, or other                            non-steroidal anti-inflammatory drugs for 2 days.                           - Await pathology results.                           - Perform an H. pylori serology today.                           - See other procedure Procedure Code(s):        --- Professional ---                           [phone], Esophagogastroduodenoscopy, flexible,  transoral; with biopsy, single or multiple Diagnosis Code(s):        --- Professional ---                           K20.9, Esophagitis, unspecified                           K29.80, Duodenitis without bleeding                           D50.0, Iron deficiency anemia secondary to blood                            loss (chronic) CPT copyright 2019 American Medical Association. All rights reserved. The codes documented in this report are preliminary and upon coder review may  be revised to meet current compliance requirements. Hildred Laser, MD Hildred Laser, MD 06/03/2019 10:43:19 AM This report has been signed electronically. Number of Addenda: 0

## 2019-06-03 NOTE — H&P (Signed)
Roger Hansen is an 73 y.o. male.   Chief Complaint: Patient is here for EGD and colonoscopy. HPI: Patient is 73 year old Afro-American male who has chronic GERD history of colonic adenomas was recently found to have iron deficiency anemia.  He does give a history of having tarry stools intermittently.  And he was seen in the office a stool was guaiac positive.  He says heartburn is well controlled with therapy.  He has occasional dysphagia with solids.  Food bolus always passes down when she takes a sip or 2 of water.  He denies abdominal pain or rectal bleeding.  He states earlier this year he lost 10 pounds but he is gained all of it back.  Says he does not drink alcohol daily.  He drinks it intermittently and it sounds like binges.  He is on low-dose aspirin which is on hold. Family history is negative for CRC.  Past Medical History:  Diagnosis Date  . Alcohol abuse 11/24/2016  . Anemia 10/11/2013  . Chronic renal disease, stage 3, moderately decreased glomerular filtration rate (GFR) between 30-59 mL/min/1.73 square meter (HCC) 12/02/2016  . Colon polyps   . GERD (gastroesophageal reflux disease)   . High cholesterol   . Hypertension   . Sinus infection     Past Surgical History:  Procedure Laterality Date  . COLONOSCOPY N/A 10/26/2013   Procedure: COLONOSCOPY;  Surgeon: Rogene Houston, MD;  Location: [facility];  Service: Endoscopy;  Laterality: N/A;  325-moved to 1230 Ann to notify pt  . Colonoscopy with polypectomy      Family History  Problem Relation Age of Onset  . Hypertension Mother   . Colon cancer Neg Hx    Social History:  reports that he quit smoking about 47 years ago. He has a 10.00 pack-year smoking history. He has never used smokeless tobacco. He reports current alcohol use. He reports that he does not use drugs.  Allergies:  Allergies  Allergen Reactions  . Horseradish [Armoracia Rusticana Ext (Horseradish)] Anaphylaxis    Medications Prior to  Admission  Medication Sig Dispense Refill  . atorvastatin (LIPITOR) 10 MG tablet Take 10 mg by mouth daily.    Marland Kitchen buPROPion (WELLBUTRIN SR) 150 MG 12 hr tablet     . buPROPion (WELLBUTRIN XL) 150 MG 24 hr tablet TAKE THREE (3) TABLETS BY MOUTH EVERY MORNING.    Marland Kitchen buPROPion (ZYBAN) 150 MG 12 hr tablet Take 150 mg by mouth 2 (two) times daily.    . busPIRone (BUSPAR) 10 MG tablet Take 10 mg by mouth 3 (three) times daily.    . cholecalciferol (VITAMIN D) 1000 UNITS tablet Take 1,000 Units by mouth 2 (two) times daily.    . ferrous sulfate 325 (65 FE) MG tablet Take 1 tablet (325 mg total) by mouth daily with breakfast. 30 tablet 3  . folic acid (FOLVITE) 1 MG tablet Take 1 mg by mouth daily.    . furosemide (LASIX) 40 MG tablet Take 40 mg by mouth daily.    Marland Kitchen GNP VITAMIN B-1 100 MG tablet     . losartan (COZAAR) 50 MG tablet Take 50 mg by mouth daily.    Marland Kitchen omeprazole (PRILOSEC) 20 MG capsule Take 20 mg by mouth daily as needed (Acid Reflux).    Marland Kitchen omeprazole (PRILOSEC) 40 MG capsule Take 40 mg by mouth daily.    Marland Kitchen PEG 3350-KCl-NaBcb-NaCl-NaSulf (PEG-3350/ELECTROLYTES) 236 g SOLR MIX AS DIRECTED AND TAKE 240 ML BY MOUTH EVERY 15 MINUTES    .  potassium chloride (K-DUR) 10 MEQ tablet Take 20 mEq by mouth 2 (two) times daily.    . potassium chloride SA (K-DUR) 20 MEQ tablet     . traZODone (DESYREL) 100 MG tablet Take 100 mg by mouth at bedtime.    . traZODone (DESYREL) 50 MG tablet Take 50 mg by mouth at bedtime.    . vitamin B-12 (CYANOCOBALAMIN) 1000 MCG tablet Take 1,000 mcg by mouth daily.    . ziprasidone (GEODON) 40 MG capsule     . aspirin 81 MG tablet Take 81 mg by mouth daily.    Marland Kitchen gabapentin (NEURONTIN) 300 MG capsule Take 300 mg by mouth 3 (three) times daily as needed.      No results found for this or any previous visit (from the past 48 hour(s)). No results found.  ROS  Blood pressure (P) 140/74, pulse (!) (P) 52, resp. rate (P) 18, SpO2 (P) 100 %. Physical Exam   Constitutional: He appears well-developed and well-nourished.  HENT:  Mouth/Throat: Oropharynx is clear and moist.  Eyes: No scleral icterus.  Conjunctivae appears to be pale.  Neck: No thyromegaly present.  Cardiovascular: Normal rate, regular rhythm and normal heart sounds.  No murmur heard. Respiratory: Effort normal and breath sounds normal.  GI: Soft. He exhibits no distension and no mass. There is no abdominal tenderness.  Musculoskeletal:        General: No edema.  Lymphadenopathy:    He has no cervical adenopathy.  Neurological: He is alert.  Skin: Skin is warm and dry.  Has hypo-pigmentation of skin over his forehead.     Assessment/Plan Iron deficiency anemia and heme positive stool. History of colonic adenomas.   Diagnostic EGD and colonoscopy.  Hildred Laser, MD 06/03/2019, 9:23 AM

## 2019-06-03 NOTE — Anesthesia Postprocedure Evaluation (Signed)
Anesthesia Post Note  Patient: Roger Hansen  Procedure(s) Performed: COLONOSCOPY WITH PROPOFOL (N/A ) ESOPHAGOGASTRODUODENOSCOPY (EGD) WITH PROPOFOL (N/A ) BIOPSY POLYPECTOMY  Patient location during evaluation: PACU Anesthesia Type: General Level of consciousness: awake and alert and oriented Pain management: pain level controlled Vital Signs Assessment: post-procedure vital signs reviewed and stable Respiratory status: spontaneous breathing Cardiovascular status: blood pressure returned to baseline and stable Postop Assessment: no apparent nausea or vomiting and adequate PO intake Anesthetic complications: no     Last Vitals:  Vitals:   06/03/19 0818  BP: (P) 140/74  Pulse: (!) (P) 52  Resp: (P) 18  SpO2: 100%    Last Pain:  Vitals:   06/03/19 0935  PainSc: 0-No pain                 Gianina Olinde

## 2019-06-03 NOTE — Anesthesia Preprocedure Evaluation (Addendum)
Anesthesia Evaluation  Patient identified by MRN, date of birth, ID band Patient awake    Reviewed: Allergy & Precautions, NPO status , Patient's Chart, lab work & pertinent test results  Airway Mallampati: II  TM Distance: >3 FB Neck ROM: Full    Dental no notable dental hx. (+) Edentulous Upper   Pulmonary neg pulmonary ROS, former smoker,    Pulmonary exam normal breath sounds clear to auscultation       Cardiovascular Exercise Tolerance: Good hypertension, Pt. on medications negative cardio ROS Normal cardiovascular examI Rhythm:Regular Rate:Normal  EF ~50% No reversible ischemia in 2019, hypokinesis noted  States mows his own lawn    Neuro/Psych PSYCHIATRIC DISORDERS negative neurological ROS     GI/Hepatic Neg liver ROS, GERD  Medicated and Controlled,  Endo/Other  negative endocrine ROS  Renal/GU Renal InsufficiencyRenal disease  negative genitourinary   Musculoskeletal negative musculoskeletal ROS (+)   Abdominal   Peds negative pediatric ROS (+)  Hematology negative hematology ROS (+) anemia , Last 7.9/26.1  Denies any transfusions in past    Anesthesia Other Findings   Reproductive/Obstetrics negative OB ROS                             Anesthesia Physical Anesthesia Plan  ASA: III  Anesthesia Plan: General   Post-op Pain Management:    Induction: Intravenous  PONV Risk Score and Plan: 2 and Ondansetron, Dexamethasone and Treatment may vary due to age or medical condition  Airway Management Planned: Nasal Cannula and Simple Face Mask  Additional Equipment:   Intra-op Plan:   Post-operative Plan:   Informed Consent: I have reviewed the patients History and Physical, chart, labs and discussed the procedure including the risks, benefits and alternatives for the proposed anesthesia with the patient or authorized representative who has indicated his/her understanding  and acceptance.     Dental advisory given  Plan Discussed with: CRNA  Anesthesia Plan Comments: (Plan full PPE use  Plan GA/TIVA   with GETA PRN-WTP with same after Q&A)        Anesthesia Quick Evaluation

## 2019-06-03 NOTE — Transfer of Care (Signed)
Immediate Anesthesia Transfer of Care Note  Patient: [name]  Procedure(s) Performed: COLONOSCOPY WITH PROPOFOL (N/A ) ESOPHAGOGASTRODUODENOSCOPY (EGD) WITH PROPOFOL (N/A ) BIOPSY POLYPECTOMY  Patient Location: PACU  Anesthesia Type:General  Level of Consciousness: awake  Airway & Oxygen Therapy: Patient Spontanous Breathing  Post-op Assessment: Report given to RN  Post vital signs: Reviewed and stable  Last Vitals:  Vitals Value Taken Time  BP    Temp    Pulse 70 06/03/19 1039  Resp 14 06/03/19 1039  SpO2 100 % 06/03/19 1039  Vitals shown include unvalidated device data.  Last Pain:  Vitals:   06/03/19 0935  PainSc: 0-No pain      Patients Stated Pain Goal: 4 (19/01/22 2411)  Complications: No apparent anesthesia complications

## 2019-06-03 NOTE — Op Note (Signed)
[facility] Patient Name: Roger Hansen Procedure Date: 06/03/2019 10:02 AM MRN: [id number] Date of Birth: July 18, 1946 Attending MD: Hildred Laser , MD CSN: [id number] Age: 73 Admit Type: Outpatient Procedure:                Colonoscopy Indications:              Iron deficiency anemia secondary to chronic blood                            loss Providers:                Hildred Laser, MD, Otis Peak B. Sharon Seller, RN, Raphael Gibney, Technician Referring MD:             Curlene Labrum, MD Medicines:                Propofol per Anesthesia Complications:            No immediate complications. Estimated Blood Loss:     Estimated blood loss was minimal. Procedure:                Pre-Anesthesia Assessment:                           - Prior to the procedure, a History and Physical                            was performed, and patient medications and                            allergies were reviewed. The patient's tolerance of                            previous anesthesia was also reviewed. The risks                            and benefits of the procedure and the sedation                            options and risks were discussed with the patient.                            All questions were answered, and informed consent                            was obtained. Prior Anticoagulants: The patient has                            taken no previous anticoagulant or antiplatelet                            agents except for aspirin. ASA Grade Assessment:  III - A patient with severe systemic disease. After                            reviewing the risks and benefits, the patient was                            deemed in satisfactory condition to undergo the                            procedure.                           After obtaining informed consent, the colonoscope                            was passed under direct vision. Throughout the                            procedure, the patient's blood pressure, pulse, and                            oxygen saturations were monitored continuously. The                            [id number]-[id number] ([id number]) scope was introduced through                            the anus and advanced to the the cecum, identified                            by appendiceal orifice and ileocecal valve. The                            colonoscopy was performed without difficulty. The                            patient tolerated the procedure well. The quality                            of the bowel preparation was good. The ileocecal                            valve, appendiceal orifice, and rectum were                            photographed. Scope In: 10:04:20 AM Scope Out: 10:28:47 AM Scope Withdrawal Time: 0 hours 20 minutes 33 seconds  Total Procedure Duration: 0 hours 24 minutes 27 seconds  Findings:      The perianal and digital rectal examinations were normal.      Four polyps were found in the transverse colon, hepatic flexure and       ascending colon. The polyps were 3 to 7 mm in size. These polyps were       removed with a cold snare. Resection and retrieval were complete. The  pathology specimen was placed into Bottle Number 2.      External hemorrhoids were found during retroflexion. The hemorrhoids       were small. Impression:               - Four 3 to 7 mm polyps in the transverse colon, at                            the hepatic flexure and in the ascending colon,                            removed with a cold snare. Resected and retrieved.                           - External hemorrhoids. Moderate Sedation:      Per Anesthesia Care Recommendation:           - Patient has a contact number available for                            emergencies. The signs and symptoms of potential                            delayed complications were discussed with the                            patient.  Return to normal activities tomorrow.                            Written discharge instructions were provided to the                            patient.                           - Resume previous diet today.                           - Continue present medications.                           - No aspirin, ibuprofen, naproxen, or other                            non-steroidal anti-inflammatory drugs for 2 days.                           - Await pathology results.                           - Repeat colonoscopy in 5 years for surveillance. Procedure Code(s):        --- Professional ---                           [phone], Colonoscopy, flexible; with removal of  tumor(s), polyp(s), or other lesion(s) by snare                            technique Diagnosis Code(s):        --- Professional ---                           K63.5, Polyp of colon                           K64.4, Residual hemorrhoidal skin tags                           D50.0, Iron deficiency anemia secondary to blood                            loss (chronic) CPT copyright 2019 American Medical Association. All rights reserved. The codes documented in this report are preliminary and upon coder review may  be revised to meet current compliance requirements. Hildred Laser, MD Hildred Laser, MD 06/03/2019 10:47:32 AM This report has been signed electronically. Number of Addenda: 0

## 2019-06-06 LAB — H. PYLORI ANTIBODY, IGG: H Pylori IgG: 0.76 Index Value (ref 0.00–0.79)

## 2019-06-07 ENCOUNTER — Other Ambulatory Visit (HOSPITAL_COMMUNITY): Payer: Self-pay | Admitting: Internal Medicine

## 2019-06-07 NOTE — Telephone Encounter (Signed)
Refill request for Medication formerly ordered by Dr. Walden Field.

## 2019-06-08 ENCOUNTER — Encounter (HOSPITAL_COMMUNITY): Payer: Self-pay | Admitting: Internal Medicine

## 2019-06-10 ENCOUNTER — Other Ambulatory Visit: Payer: Self-pay

## 2019-06-10 ENCOUNTER — Inpatient Hospital Stay (HOSPITAL_COMMUNITY): Payer: Medicare Other

## 2019-06-10 ENCOUNTER — Other Ambulatory Visit (HOSPITAL_COMMUNITY): Payer: Medicare Other

## 2019-06-10 ENCOUNTER — Encounter (HOSPITAL_COMMUNITY): Payer: Self-pay

## 2019-06-10 ENCOUNTER — Ambulatory Visit (HOSPITAL_COMMUNITY): Payer: Medicare Other

## 2019-06-10 VITALS — BP 136/66 | HR 63 | Temp 98.1°F | Resp 18

## 2019-06-10 DIAGNOSIS — N183 Chronic kidney disease, stage 3 (moderate): Secondary | ICD-10-CM | POA: Diagnosis not present

## 2019-06-10 DIAGNOSIS — D649 Anemia, unspecified: Secondary | ICD-10-CM

## 2019-06-10 LAB — CBC
HCT: 33.5 % — ABNORMAL LOW (ref 39.0–52.0)
Hemoglobin: 10 g/dL — ABNORMAL LOW (ref 13.0–17.0)
MCH: 28.3 pg (ref 26.0–34.0)
MCHC: 29.9 g/dL — ABNORMAL LOW (ref 30.0–36.0)
MCV: 94.9 fL (ref 80.0–100.0)
Platelets: 294 10*3/uL (ref 150–400)
RBC: 3.53 MIL/uL — ABNORMAL LOW (ref 4.22–5.81)
RDW: 18.4 % — ABNORMAL HIGH (ref 11.5–15.5)
WBC: 4.4 10*3/uL (ref 4.0–10.5)
nRBC: 0 % (ref 0.0–0.2)

## 2019-06-10 MED ORDER — EPOETIN ALFA 20000 UNIT/ML IJ SOLN
20000.0000 [IU] | Freq: Once | INTRAMUSCULAR | Status: AC
  Administered 2019-06-10: 20000 [IU] via SUBCUTANEOUS

## 2019-06-10 MED ORDER — EPOETIN ALFA 20000 UNIT/ML IJ SOLN
INTRAMUSCULAR | Status: AC
  Filled 2019-06-10: qty 1

## 2019-06-10 NOTE — Progress Notes (Signed)
Pt here today for Epogen injection. Pt given injection in right arm. Pt tolerated injection well with no complaints. Pt stable and discharged home ambulatory. Pt to return as scheduled for labs and epogen injections.

## 2019-06-16 ENCOUNTER — Other Ambulatory Visit (HOSPITAL_COMMUNITY): Payer: Medicare Other

## 2019-06-16 ENCOUNTER — Ambulatory Visit (HOSPITAL_COMMUNITY): Payer: Medicare Other | Admitting: Nurse Practitioner

## 2019-06-20 ENCOUNTER — Inpatient Hospital Stay (HOSPITAL_BASED_OUTPATIENT_CLINIC_OR_DEPARTMENT_OTHER): Payer: Medicare Other | Admitting: Hematology

## 2019-06-20 ENCOUNTER — Ambulatory Visit (HOSPITAL_COMMUNITY): Payer: Medicare Other

## 2019-06-20 ENCOUNTER — Inpatient Hospital Stay (HOSPITAL_COMMUNITY): Payer: Medicare Other | Attending: Hematology

## 2019-06-20 ENCOUNTER — Other Ambulatory Visit: Payer: Self-pay

## 2019-06-20 ENCOUNTER — Inpatient Hospital Stay (HOSPITAL_COMMUNITY): Payer: Medicare Other

## 2019-06-20 ENCOUNTER — Other Ambulatory Visit (INDEPENDENT_AMBULATORY_CARE_PROVIDER_SITE_OTHER): Payer: Self-pay | Admitting: *Deleted

## 2019-06-20 ENCOUNTER — Ambulatory Visit (HOSPITAL_COMMUNITY): Payer: Medicare Other | Admitting: Hematology

## 2019-06-20 ENCOUNTER — Other Ambulatory Visit (HOSPITAL_COMMUNITY): Payer: Medicare Other

## 2019-06-20 ENCOUNTER — Encounter (HOSPITAL_COMMUNITY): Payer: Self-pay | Admitting: Hematology

## 2019-06-20 DIAGNOSIS — D649 Anemia, unspecified: Secondary | ICD-10-CM | POA: Diagnosis not present

## 2019-06-20 DIAGNOSIS — N183 Chronic kidney disease, stage 3 (moderate): Secondary | ICD-10-CM | POA: Diagnosis not present

## 2019-06-20 DIAGNOSIS — D500 Iron deficiency anemia secondary to blood loss (chronic): Secondary | ICD-10-CM

## 2019-06-20 DIAGNOSIS — K2980 Duodenitis without bleeding: Secondary | ICD-10-CM

## 2019-06-20 DIAGNOSIS — D631 Anemia in chronic kidney disease: Secondary | ICD-10-CM | POA: Diagnosis not present

## 2019-06-20 LAB — CBC WITH DIFFERENTIAL/PLATELET
Abs Immature Granulocytes: 0 10*3/uL (ref 0.00–0.07)
Basophils Absolute: 0 10*3/uL (ref 0.0–0.1)
Basophils Relative: 1 %
Eosinophils Absolute: 0.3 10*3/uL (ref 0.0–0.5)
Eosinophils Relative: 6 %
HCT: 35.2 % — ABNORMAL LOW (ref 39.0–52.0)
Hemoglobin: 10.4 g/dL — ABNORMAL LOW (ref 13.0–17.0)
Immature Granulocytes: 0 %
Lymphocytes Relative: 34 %
Lymphs Abs: 1.5 10*3/uL (ref 0.7–4.0)
MCH: 27.7 pg (ref 26.0–34.0)
MCHC: 29.5 g/dL — ABNORMAL LOW (ref 30.0–36.0)
MCV: 93.9 fL (ref 80.0–100.0)
Monocytes Absolute: 0.5 10*3/uL (ref 0.1–1.0)
Monocytes Relative: 11 %
Neutro Abs: 2.1 10*3/uL (ref 1.7–7.7)
Neutrophils Relative %: 48 %
Platelets: 269 10*3/uL (ref 150–400)
RBC: 3.75 MIL/uL — ABNORMAL LOW (ref 4.22–5.81)
RDW: 16.9 % — ABNORMAL HIGH (ref 11.5–15.5)
WBC: 4.3 10*3/uL (ref 4.0–10.5)
nRBC: 0 % (ref 0.0–0.2)

## 2019-06-20 LAB — COMPREHENSIVE METABOLIC PANEL
ALT: 9 U/L (ref 0–44)
AST: 13 U/L — ABNORMAL LOW (ref 15–41)
Albumin: 3.9 g/dL (ref 3.5–5.0)
Alkaline Phosphatase: 56 U/L (ref 38–126)
Anion gap: 8 (ref 5–15)
BUN: 18 mg/dL (ref 8–23)
CO2: 26 mmol/L (ref 22–32)
Calcium: 9.5 mg/dL (ref 8.9–10.3)
Chloride: 103 mmol/L (ref 98–111)
Creatinine, Ser: 0.83 mg/dL (ref 0.61–1.24)
GFR calc Af Amer: >60 mL/min (ref >60)
GFR calc non Af Amer: >60 mL/min (ref >60)
Glucose, Bld: 89 mg/dL (ref 70–99)
Potassium: 4.7 mmol/L (ref 3.5–5.1)
Sodium: 137 mmol/L (ref 135–145)
Total Bilirubin: 0.3 mg/dL (ref 0.3–1.2)
Total Protein: 7.2 g/dL (ref 6.5–8.1)

## 2019-06-20 LAB — IRON AND TIBC
Iron: 47 ug/dL (ref 45–182)
Saturation Ratios: 18 % (ref 17.9–39.5)
TIBC: 264 ug/dL (ref 250–450)
UIBC: 217 ug/dL

## 2019-06-20 LAB — FERRITIN: Ferritin: 315 ng/mL (ref 24–336)

## 2019-06-20 MED ORDER — EPOETIN ALFA 20000 UNIT/ML IJ SOLN
20000.0000 [IU] | Freq: Once | INTRAMUSCULAR | Status: AC
  Administered 2019-06-20: 20000 [IU] via SUBCUTANEOUS
  Filled 2019-06-20: qty 1

## 2019-06-20 NOTE — Progress Notes (Signed)
Pt stated that he has had suicidal thoughts but no active plan to hurt himself.  He told me that his daughter died about a year ago and the death of his father was really hard on him.  He stated that he also suffers from PTSD and thinks about his friends being killed in [place].  I told him I would be contacting the social worker on his behalf, and I left Edwyna Shell an email.

## 2019-06-20 NOTE — Patient Instructions (Signed)
[facility]  Discharge Instructions:  You saw Renee, NP, today. _______________________________________________________________  Thank you for choosing [facility] at [facility] to provide your oncology and hematology care.  To afford each patient quality time with our providers, please arrive at least 15 minutes before your scheduled appointment.  You need to re-schedule your appointment if you arrive 10 or more minutes late.  We strive to give you quality time with our providers, and arriving late affects you and other patients whose appointments are after yours.  Also, if you no show three or more times for appointments you may be dismissed from the clinic.  Again, thank you for choosing [facility] at [facility]. Our hope is that these requests will allow you access to exceptional care and in a timely manner. _______________________________________________________________  If you have questions after your visit, please contact our office at [phone] between the hours of 8:30 a.m. and 5:00 p.m. Voicemails left after 4:30 p.m. will not be returned until the following business day. _______________________________________________________________  For prescription refill requests, have your pharmacy contact our office. _______________________________________________________________  Recommendations made by the consultant and any test results will be sent to your referring physician. _______________________________________________________________ 

## 2019-06-20 NOTE — Progress Notes (Signed)
Roger Hansen, Roger Hansen [id number]   CLINIC:  Medical Oncology/Hematology  PCP:  Curlene Labrum, MD [place] [phone] [phone]   REASON FOR VISIT:  Follow-up for Anemia  CURRENT THERAPY: Procrit Injections    INTERVAL HISTORY:  Roger Hansen 73 y.o. male presents today for follow-up.  Reports overall doing well.  Denies any significant fatigue.  Denies any obvious signs of bleeding.  He did mention to the nurse that he is more depressed than usual secondary to national pandemic and staying in place restrictions.  He states his overall energy level has improved since starting iron and Procrit injections.    REVIEW OF SYSTEMS:  Review of Systems  Musculoskeletal: Positive for arthralgias and back pain.  Psychiatric/Behavioral: Positive for depression.  All other systems reviewed and are negative.    PAST MEDICAL/SURGICAL HISTORY:  Past Medical History:  Diagnosis Date  . Alcohol abuse 11/24/2016  . Anemia 10/11/2013  . Chronic renal disease, stage 3, moderately decreased glomerular filtration rate (GFR) between 30-59 mL/min/1.73 square meter (HCC) 12/02/2016  . Colon polyps   . GERD (gastroesophageal reflux disease)   . High cholesterol   . Hypertension   . Sinus infection    Past Surgical History:  Procedure Laterality Date  . BIOPSY  06/03/2019   Procedure: BIOPSY;  Surgeon: Rogene Houston, MD;  [facility]: [facility];  Service: Endoscopy;;  esophagus  . COLONOSCOPY N/A 10/26/2013   Procedure: COLONOSCOPY;  Surgeon: Rogene Houston, MD;  [facility]: [facility];  Service: Endoscopy;  Laterality: N/A;  325-moved to 1230 Ann to notify pt  . Colonoscopy with polypectomy    . COLONOSCOPY WITH PROPOFOL N/A 06/03/2019   Procedure: COLONOSCOPY WITH PROPOFOL;  Surgeon: Rogene Houston, MD;  [facility]: [facility];  Service: Endoscopy;  Laterality: N/A;  . ESOPHAGOGASTRODUODENOSCOPY (EGD) WITH PROPOFOL N/A 06/03/2019   Procedure: ESOPHAGOGASTRODUODENOSCOPY (EGD) WITH PROPOFOL;  Surgeon: Rogene Houston, MD;  Location: [facility];  Service: Endoscopy;  Laterality: N/A;  . POLYPECTOMY  06/03/2019   Procedure: POLYPECTOMY;  Surgeon: Rogene Houston, MD;  Location: [facility];  Service: Endoscopy;;  colon     SOCIAL HISTORY:  Social History   Socioeconomic History  . Marital status: Married    Spouse name: Not on file  . Number of children: Not on file  . Years of education: Not on file  . Highest education level: Not on file  Occupational History  . Not on file  Social Needs  . Financial resource strain: Not on file  . Food insecurity    Worry: Not on file    Inability: Not on file  . Transportation needs    Medical: Not on file    Non-medical: Not on file  Tobacco Use  . Smoking status: Former Smoker    Packs/day: 0.50    Years: 20.00    Pack years: 10.00    Quit date: 12/27/1971    Years since quitting: 47.5  . Smokeless tobacco: Never Used  Substance and Sexual Activity  . Alcohol use: Yes    Comment: 1 pint of etoh a day ( not recently)  . Drug use: No  . Sexual activity: Not on file  Lifestyle  . Physical activity    Days per week: Not on file    Minutes per session: Not on file  . Stress: Not on file  Relationships  . Social connections  Talks on phone: Not on file    Gets together: Not on file    Attends religious service: Not on file    Active member of club or organization: Not on file    Attends meetings of clubs or organizations: Not on file    Relationship status: Not on file  . Intimate partner violence    Fear of current or ex partner: Not on file    Emotionally abused: Not on file    Physically abused: Not on file    Forced sexual activity: Not on file  Other Topics Concern  . Not on file  Social History Narrative  . Not on file    FAMILY HISTORY:  Family History  Problem Relation Age of Onset  . Hypertension Mother   . Colon cancer Neg Hx      CURRENT MEDICATIONS:  Outpatient Encounter Medications as of 06/20/2019  Medication Sig Note  . aspirin 81 MG tablet Take 1 tablet (81 mg total) by mouth daily.   Marland Kitchen atorvastatin (LIPITOR) 10 MG tablet Take 10 mg by mouth daily.   Marland Kitchen buPROPion (WELLBUTRIN SR) 150 MG 12 hr tablet Take 150 mg by mouth daily.    . busPIRone (BUSPAR) 10 MG tablet Take 10 mg by mouth 3 (three) times daily.   . cholecalciferol (VITAMIN D) 1000 UNITS tablet Take 1,000 Units by mouth 2 (two) times daily.   . Ferrous Sulfate (IRON) 325 (65 Fe) MG TABS TAKE ONE TABLET BY MOUTH DAILY WITH BREAKFAST. (Patient taking differently: 1 tablet 2 (two) times a day. )   . folic acid (FOLVITE) 1 MG tablet Take 1 mg by mouth daily.   . furosemide (LASIX) 40 MG tablet Take 40 mg by mouth daily.   Marland Kitchen gabapentin (NEURONTIN) 300 MG capsule Take 300 mg by mouth 3 (three) times daily as needed. 10/21/2016: Received from: External Pharmacy  . GNP VITAMIN B-1 100 MG tablet Take 100 mg by mouth daily.    Marland Kitchen losartan (COZAAR) 50 MG tablet Take 50 mg by mouth daily.   Marland Kitchen omeprazole (PRILOSEC) 20 MG capsule Take 20 mg by mouth daily as needed (Acid Reflux).   . potassium chloride (K-DUR) 10 MEQ tablet Take 10 mEq by mouth 2 (two) times daily.    . traZODone (DESYREL) 100 MG tablet Take 100 mg by mouth at bedtime.   . vitamin B-12 (CYANOCOBALAMIN) 1000 MCG tablet Take 1,000 mcg by mouth daily.   . ziprasidone (GEODON) 40 MG capsule Take 40 mg by mouth 2 (two) times daily with a meal.    . [DISCONTINUED] omeprazole (PRILOSEC) 40 MG capsule Take 40 mg by mouth daily.    No facility-administered encounter medications on file as of 06/20/2019.     ALLERGIES:  Allergies  Allergen Reactions  . Horseradish [Armoracia Rusticana Ext (Horseradish)] Anaphylaxis     PHYSICAL EXAM:  ECOG Performance status: 1  Vitals:   06/20/19 1303  BP: 115/63  Pulse: 67  Resp: 16  Temp: (!) 97.5 F (36.4 C)  SpO2: 100%   Filed Weights   06/20/19 1303   Weight: 175 lb 8 oz (79.6 kg)    Physical Exam Constitutional:      Appearance: Normal appearance. He is obese.  HENT:     Head: Normocephalic.     Nose: Nose normal.     Mouth/Throat:     Mouth: Mucous membranes are moist.     Pharynx: Oropharynx is clear.  Eyes:     Conjunctiva/sclera: Conjunctivae  normal.  Neck:     Musculoskeletal: Normal range of motion.  Cardiovascular:     Rate and Rhythm: Normal rate and regular rhythm.     Pulses: Normal pulses.     Heart sounds: Normal heart sounds.  Pulmonary:     Effort: Pulmonary effort is normal.     Breath sounds: Normal breath sounds.  Abdominal:     General: Bowel sounds are normal.     Palpations: Abdomen is soft.  Musculoskeletal: Normal range of motion.  Skin:    General: Skin is warm.  Neurological:     General: No focal deficit present.     Mental Status: He is alert and oriented to person, place, and time.  Psychiatric:        Mood and Affect: Mood normal.        Behavior: Behavior normal.        Thought Content: Thought content normal.        Judgment: Judgment normal.      LABORATORY DATA:  I have reviewed the labs as listed.  CBC    Component Value Date/Time   WBC 4.3 06/20/2019 1213   RBC 3.75 (L) 06/20/2019 1213   HGB 10.4 (L) 06/20/2019 1213   HCT 35.2 (L) 06/20/2019 1213   HCT 27.9 (L) 08/13/2016 0944   PLT 269 06/20/2019 1213   MCV 93.9 06/20/2019 1213   MCH 27.7 06/20/2019 1213   MCHC 29.5 (L) 06/20/2019 1213   RDW 16.9 (H) 06/20/2019 1213   LYMPHSABS 1.5 06/20/2019 1213   MONOABS 0.5 06/20/2019 1213   EOSABS 0.3 06/20/2019 1213   BASOSABS 0.0 06/20/2019 1213   CMP Latest Ref Rng & Units 06/20/2019 05/25/2019 10/12/2018  Glucose 70 - 99 mg/dL 89 89 134(H)  BUN 8 - 23 mg/dL 18 11 13   Creatinine 0.61 - 1.24 mg/dL 0.83 0.94 1.04  Sodium 135 - 145 mmol/L 137 140 134(L)  Potassium 3.5 - 5.1 mmol/L 4.7 4.0 3.5  Chloride 98 - 111 mmol/L 103 104 95(L)  CO2 22 - 32 mmol/L 26 25 30   Calcium 8.9  - 10.3 mg/dL 9.5 8.9 9.8  Total Protein 6.5 - 8.1 g/dL 7.2 6.6 7.6  Total Bilirubin 0.3 - 1.2 mg/dL 0.3 0.8 1.8(H)  Alkaline Phos 38 - 126 U/L 56 53 79  AST 15 - 41 U/L 13(L) 18 37  ALT 0 - 44 U/L 9 11 16        ASSESSMENT & PLAN:   Iron deficiency anemia due to chronic blood loss 1.  Normocytic normochromic anemia: - It is felt that his chronic alcohol abuse is felt to be the contributing factor to his anemia given its toxicity on the bone marrow. - His anemia dates back to at least 2014 with a negative peripheral work-up, negative bone marrow aspiration and biopsy and cytogenetics on 10/16/2016 in the setting of alcohol abuse. -It was reported that he was on iron tablets in the past and tolerated fairly well.  He was placed back on oral iron.  He is taking without problems. -Labs on 05/25/2019 showed his hemoglobin is 7.7, ferritin 136, percent saturation 17, B12 407, and platelets 318. - He is a Jehovah witness and does not receive blood products.  He does accept platelets but his platelets are good at 318. - He received IV iron on June 11 and June 02, 2019 with no adverse reactions.  He is also been receiving weekly Procrit 20,000 units.  His hemoglobin has improved from 7.7-10.4.  We will continue with weekly Procrit injections. If hemoglobin remains above 10 recommend to change Procrit to every 2 weeks. -Return to clinic in 8 weeks.  2.  EtOH abuse: - It has been previously discussed several times regarding AA. -Patient will follow-up with his PCP      Orders placed this encounter:  Orders Placed This Encounter  Procedures  . CBC with Differential  . Comprehensive metabolic panel  . Iron and TIBC  . [place], [place] [phone]

## 2019-06-20 NOTE — Assessment & Plan Note (Signed)
1.  Normocytic normochromic anemia: - It is felt that his chronic alcohol abuse is felt to be the contributing factor to his anemia given its toxicity on the bone marrow. - His anemia dates back to at least 2014 with a negative peripheral work-up, negative bone marrow aspiration and biopsy and cytogenetics on 10/16/2016 in the setting of alcohol abuse. -It was reported that he was on iron tablets in the past and tolerated fairly well.  He was placed back on oral iron.  He is taking without problems. -Labs on 05/25/2019 showed his hemoglobin is 7.7, ferritin 136, percent saturation 17, B12 407, and platelets 318. - He is a Jehovah witness and does not receive blood products.  He does accept platelets but his platelets are good at 318. - He received IV iron on June 11 and June 02, 2019 with no adverse reactions.  He is also been receiving weekly Procrit 20,000 units.  His hemoglobin has improved from 7.7-10.4.  We will continue with weekly Procrit injections. If hemoglobin remains above 10 recommend to change Procrit to every 2 weeks. -Return to clinic in 8 weeks.  2.  EtOH abuse: - It has been previously discussed several times regarding AA. -Patient will follow-up with his PCP

## 2019-06-21 ENCOUNTER — Encounter: Payer: Self-pay | Admitting: General Practice

## 2019-06-21 NOTE — Progress Notes (Signed)
[facility] CSW Progress Notes  Request received from RN to reach out to patient to assess for distress/needs for support.  Had expressed this pt stated that "he has had suicidal thoughts nut no active plan to hurt himself. He said that he has gotten more depressed since the Covid pandemic since he cannot go out. His daughter died about a year ago, and he also suffers from PTSD and said he thinks about his friends who got killed in [place]."  Unable to reach patient.  Talked w wife - "he's not doing too badly - he's with his mother right now."  Has lots of family in the area, well connected.  Wife is cancer survivor, post stem cell transplant.  Daughter died of cancer as did wife's sister.  Left message w wife, asked patient to call me so I can speak w him directly.   Edwyna Shell, LCSW Clinical Social Worker Phone:  [phone]

## 2019-06-24 ENCOUNTER — Other Ambulatory Visit (HOSPITAL_COMMUNITY): Payer: Medicare Other

## 2019-06-27 ENCOUNTER — Ambulatory Visit (HOSPITAL_COMMUNITY): Payer: Medicare Other

## 2019-06-27 ENCOUNTER — Other Ambulatory Visit (HOSPITAL_COMMUNITY): Payer: Medicare Other

## 2019-06-28 DIAGNOSIS — I700 Atherosclerosis of aorta: Secondary | ICD-10-CM | POA: Diagnosis not present

## 2019-06-28 DIAGNOSIS — I10 Essential (primary) hypertension: Secondary | ICD-10-CM | POA: Diagnosis not present

## 2019-06-28 DIAGNOSIS — E876 Hypokalemia: Secondary | ICD-10-CM | POA: Diagnosis not present

## 2019-06-28 DIAGNOSIS — Z0001 Encounter for general adult medical examination with abnormal findings: Secondary | ICD-10-CM | POA: Diagnosis not present

## 2019-06-28 DIAGNOSIS — I426 Alcoholic cardiomyopathy: Secondary | ICD-10-CM | POA: Diagnosis not present

## 2019-06-28 DIAGNOSIS — Z6829 Body mass index (BMI) 29.0-29.9, adult: Secondary | ICD-10-CM | POA: Diagnosis not present

## 2019-06-28 DIAGNOSIS — I5032 Chronic diastolic (congestive) heart failure: Secondary | ICD-10-CM | POA: Diagnosis not present

## 2019-06-28 DIAGNOSIS — R600 Localized edema: Secondary | ICD-10-CM | POA: Diagnosis not present

## 2019-06-29 ENCOUNTER — Ambulatory Visit (HOSPITAL_COMMUNITY): Admit: 2019-06-29 | Payer: Medicare Other | Admitting: Internal Medicine

## 2019-06-29 ENCOUNTER — Encounter (HOSPITAL_COMMUNITY): Payer: Self-pay

## 2019-06-29 SURGERY — COLONOSCOPY
Anesthesia: Moderate Sedation

## 2019-07-04 ENCOUNTER — Inpatient Hospital Stay (HOSPITAL_COMMUNITY): Payer: Medicare Other

## 2019-07-04 ENCOUNTER — Other Ambulatory Visit: Payer: Self-pay

## 2019-07-04 DIAGNOSIS — D631 Anemia in chronic kidney disease: Secondary | ICD-10-CM | POA: Diagnosis not present

## 2019-07-04 DIAGNOSIS — D649 Anemia, unspecified: Secondary | ICD-10-CM

## 2019-07-04 DIAGNOSIS — N183 Chronic kidney disease, stage 3 (moderate): Secondary | ICD-10-CM | POA: Diagnosis not present

## 2019-07-04 LAB — CBC
HCT: 37.6 % — ABNORMAL LOW (ref 39.0–52.0)
Hemoglobin: 11.4 g/dL — ABNORMAL LOW (ref 13.0–17.0)
MCH: 27.3 pg (ref 26.0–34.0)
MCHC: 30.3 g/dL (ref 30.0–36.0)
MCV: 90.2 fL (ref 80.0–100.0)
Platelets: 225 10*3/uL (ref 150–400)
RBC: 4.17 MIL/uL — ABNORMAL LOW (ref 4.22–5.81)
RDW: 16.1 % — ABNORMAL HIGH (ref 11.5–15.5)
WBC: 8.6 10*3/uL (ref 4.0–10.5)
nRBC: 0 % (ref 0.0–0.2)

## 2019-07-04 NOTE — Progress Notes (Signed)
Hgb 11.4 today. Instructed the patient he did not need his injection today with understanding verbalized.  Copy of labs given.  Patient left with no s/s of distress noted.

## 2019-07-11 ENCOUNTER — Inpatient Hospital Stay (HOSPITAL_COMMUNITY): Payer: Medicare Other

## 2019-07-11 ENCOUNTER — Other Ambulatory Visit: Payer: Self-pay

## 2019-07-11 DIAGNOSIS — D631 Anemia in chronic kidney disease: Secondary | ICD-10-CM | POA: Diagnosis not present

## 2019-07-11 DIAGNOSIS — D649 Anemia, unspecified: Secondary | ICD-10-CM

## 2019-07-11 DIAGNOSIS — N183 Chronic kidney disease, stage 3 (moderate): Secondary | ICD-10-CM | POA: Diagnosis not present

## 2019-07-11 LAB — CBC
HCT: 37.3 % — ABNORMAL LOW (ref 39.0–52.0)
Hemoglobin: 11.1 g/dL — ABNORMAL LOW (ref 13.0–17.0)
MCH: 26.7 pg (ref 26.0–34.0)
MCHC: 29.8 g/dL — ABNORMAL LOW (ref 30.0–36.0)
MCV: 89.9 fL (ref 80.0–100.0)
Platelets: 221 10*3/uL (ref 150–400)
RBC: 4.15 MIL/uL — ABNORMAL LOW (ref 4.22–5.81)
RDW: 15.5 % (ref 11.5–15.5)
WBC: 4.3 10*3/uL (ref 4.0–10.5)
nRBC: 0 % (ref 0.0–0.2)

## 2019-07-11 NOTE — Progress Notes (Signed)
Roger Hansen presents today for Procrit injection. Injection held per parameters. Copy of lab work given to patient. Discharged in satisfactory condition.

## 2019-07-14 ENCOUNTER — Other Ambulatory Visit (HOSPITAL_COMMUNITY): Payer: Self-pay | Admitting: Nurse Practitioner

## 2019-07-18 ENCOUNTER — Other Ambulatory Visit: Payer: Self-pay

## 2019-07-18 ENCOUNTER — Encounter (HOSPITAL_COMMUNITY): Payer: Self-pay

## 2019-07-18 ENCOUNTER — Inpatient Hospital Stay (HOSPITAL_COMMUNITY): Payer: Medicare Other | Attending: Hematology

## 2019-07-18 ENCOUNTER — Inpatient Hospital Stay (HOSPITAL_COMMUNITY): Payer: Medicare Other

## 2019-07-18 VITALS — BP 108/57 | HR 69 | Temp 97.7°F | Resp 18

## 2019-07-18 DIAGNOSIS — D649 Anemia, unspecified: Secondary | ICD-10-CM

## 2019-07-18 DIAGNOSIS — N183 Chronic kidney disease, stage 3 (moderate): Secondary | ICD-10-CM | POA: Diagnosis not present

## 2019-07-18 DIAGNOSIS — D631 Anemia in chronic kidney disease: Secondary | ICD-10-CM | POA: Insufficient documentation

## 2019-07-18 LAB — CBC
HCT: 34.7 % — ABNORMAL LOW (ref 39.0–52.0)
Hemoglobin: 10.6 g/dL — ABNORMAL LOW (ref 13.0–17.0)
MCH: 27.8 pg (ref 26.0–34.0)
MCHC: 30.5 g/dL (ref 30.0–36.0)
MCV: 91.1 fL (ref 80.0–100.0)
Platelets: 205 10*3/uL (ref 150–400)
RBC: 3.81 MIL/uL — ABNORMAL LOW (ref 4.22–5.81)
RDW: 15.3 % (ref 11.5–15.5)
WBC: 5.8 10*3/uL (ref 4.0–10.5)
nRBC: 0 % (ref 0.0–0.2)

## 2019-07-18 MED ORDER — EPOETIN ALFA 20000 UNIT/ML IJ SOLN
20000.0000 [IU] | Freq: Once | INTRAMUSCULAR | Status: AC
  Administered 2019-07-18: 20000 [IU] via SUBCUTANEOUS

## 2019-07-18 MED ORDER — EPOETIN ALFA 20000 UNIT/ML IJ SOLN
INTRAMUSCULAR | Status: AC
  Filled 2019-07-18: qty 1

## 2019-07-25 ENCOUNTER — Inpatient Hospital Stay (HOSPITAL_COMMUNITY): Payer: Medicare Other

## 2019-07-25 ENCOUNTER — Other Ambulatory Visit: Payer: Self-pay

## 2019-07-25 VITALS — BP 114/58 | HR 63 | Temp 97.5°F | Resp 18

## 2019-07-25 DIAGNOSIS — D649 Anemia, unspecified: Secondary | ICD-10-CM

## 2019-07-25 DIAGNOSIS — D631 Anemia in chronic kidney disease: Secondary | ICD-10-CM | POA: Diagnosis not present

## 2019-07-25 DIAGNOSIS — N183 Chronic kidney disease, stage 3 (moderate): Secondary | ICD-10-CM | POA: Diagnosis not present

## 2019-07-25 LAB — CBC
HCT: 35.9 % — ABNORMAL LOW (ref 39.0–52.0)
Hemoglobin: 10.8 g/dL — ABNORMAL LOW (ref 13.0–17.0)
MCH: 27.7 pg (ref 26.0–34.0)
MCHC: 30.1 g/dL (ref 30.0–36.0)
MCV: 92.1 fL (ref 80.0–100.0)
Platelets: 224 10*3/uL (ref 150–400)
RBC: 3.9 MIL/uL — ABNORMAL LOW (ref 4.22–5.81)
RDW: 15.9 % — ABNORMAL HIGH (ref 11.5–15.5)
WBC: 4.5 10*3/uL (ref 4.0–10.5)
nRBC: 0 % (ref 0.0–0.2)

## 2019-07-25 MED ORDER — EPOETIN ALFA 20000 UNIT/ML IJ SOLN
20000.0000 [IU] | Freq: Once | INTRAMUSCULAR | Status: AC
  Administered 2019-07-25: 20000 [IU] via SUBCUTANEOUS

## 2019-07-25 MED ORDER — EPOETIN ALFA 20000 UNIT/ML IJ SOLN
INTRAMUSCULAR | Status: AC
  Filled 2019-07-25: qty 1

## 2019-07-25 NOTE — Patient Instructions (Signed)
[facility]  Discharge Instructions:  Procrit injection received today.  Epoetin Alfa injection What is this medicine? EPOETIN ALFA (e POE e tin AL fa) helps your body make more red blood cells. This medicine is used to treat anemia caused by chronic kidney disease, cancer chemotherapy, or HIV-therapy. It may also be used before surgery if you have anemia. This medicine may be used for other purposes; ask your health care provider or pharmacist if you have questions. COMMON BRAND NAME(S): Epogen, Procrit, Retacrit What should I tell my health care provider before I take this medicine? They need to know if you have any of these conditions:  cancer  heart disease  high blood pressure  history of blood clots  history of stroke  low levels of folate, iron, or vitamin B12 in the blood  seizures  an unusual or allergic reaction to erythropoietin, albumin, benzyl alcohol, hamster proteins, other medicines, foods, dyes, or preservatives  pregnant or trying to get pregnant  breast-feeding How should I use this medicine? This medicine is for injection into a vein or under the skin. It is usually given by a health care professional in a hospital or clinic setting. If you get this medicine at home, you will be taught how to prepare and give this medicine. Use exactly as directed. Take your medicine at regular intervals. Do not take your medicine more often than directed. It is important that you put your used needles and syringes in a special sharps container. Do not put them in a trash can. If you do not have a sharps container, call your pharmacist or healthcare provider to get one. A special MedGuide will be given to you by the pharmacist with each prescription and refill. Be sure to read this information carefully each time. Talk to your pediatrician regarding the use of this medicine in children. While this drug may be prescribed for selected  conditions, precautions do apply. Overdosage: If you think you have taken too much of this medicine contact a poison control center or emergency room at once. NOTE: This medicine is only for you. Do not share this medicine with others. What if I miss a dose? If you miss a dose, take it as soon as you can. If it is almost time for your next dose, take only that dose. Do not take double or extra doses. What may interact with this medicine? Interactions have not been studied. This list may not describe all possible interactions. Give your health care provider a list of all the medicines, herbs, non-prescription drugs, or dietary supplements you use. Also tell them if you smoke, drink alcohol, or use illegal drugs. Some items may interact with your medicine. What should I watch for while using this medicine? Your condition will be monitored carefully while you are receiving this medicine. You may need blood work done while you are taking this medicine. This medicine may cause a decrease in vitamin B6. You should make sure that you get enough vitamin B6 while you are taking this medicine. Discuss the foods you eat and the vitamins you take with your health care professional. What side effects may I notice from receiving this medicine? Side effects that you should report to your doctor or health care professional as soon as possible:  allergic reactions like skin rash, itching or hives, swelling of the face, lips, or tongue  seizures  signs and symptoms of a blood clot such as breathing problems; changes in   vision; chest pain; severe, sudden headache; pain, swelling, warmth in the leg; trouble speaking; sudden numbness or weakness of the face, arm or leg  signs and symptoms of a stroke like changes in vision; confusion; trouble speaking or understanding; severe headaches; sudden numbness or weakness of the face, arm or leg; trouble walking; dizziness; loss of balance or coordination Side effects that  usually do not require medical attention (report to your doctor or health care professional if they continue or are bothersome):  chills  cough  dizziness  fever  headaches  joint pain  muscle cramps  muscle pain  nausea, vomiting  pain, redness, or irritation at site where injected This list may not describe all possible side effects. Call your doctor for medical advice about side effects. You may report side effects to FDA at 1-800-FDA-1088. Where should I keep my medicine? Keep out of the reach of children. Store in a refrigerator between 2 and 8 degrees C (36 and 46 degrees F). Do not freeze or shake. Throw away any unused portion if using a single-dose vial. Multi-dose vials can be kept in the refrigerator for up to 21 days after the initial dose. Throw away unused medicine. NOTE: This sheet is a summary. It may not cover all possible information. If you have questions about this medicine, talk to your doctor, pharmacist, or health care provider.  2020 Elsevier/Gold Standard (2017-07-10 08:35:19)  _______________________________________________________________  Thank you for choosing [facility] to provide your oncology and hematology care.  To afford each patient quality time with our providers, please arrive at least 15 minutes before your scheduled appointment.  You need to re-schedule your appointment if you arrive 10 or more minutes late.  We strive to give you quality time with our providers, and arriving late affects you and other patients whose appointments are after yours.  Also, if you no show three or more times for appointments you may be dismissed from the clinic.  Again, thank you for choosing [facility]. Our hope is that these requests will allow you access to exceptional care and in a timely manner. _______________________________________________________________  If you have questions  after your visit, please contact our office at [phone] between the hours of 8:30 a.m. and 5:00 p.m. Voicemails left after 4:30 p.m. will not be returned until the following business day. _______________________________________________________________  For prescription refill requests, have your pharmacy contact our office. _______________________________________________________________  Recommendations made by the consultant and any test results will be sent to your referring physician. _______________________________________________________________ 

## 2019-07-25 NOTE — Progress Notes (Signed)
Roger Hansen presents today for Procrit injection. Hemoglobin reviewed prior to administration. VSS. Injection tolerated without incident or complaint. See MAR for details. Patient discharged in satisfactory condition with follow up instructions.

## 2019-07-26 ENCOUNTER — Telehealth: Payer: Self-pay | Admitting: Cardiovascular Disease

## 2019-07-26 NOTE — Telephone Encounter (Signed)
Virtual Visit Pre-Appointment Phone Call  "(Name), I am calling you today to discuss your upcoming appointment. We are currently trying to limit exposure to the virus that causes COVID-19 by seeing patients at home rather than in the office."  1. "What is the BEST phone number to call the day of the visit?" - include this in appointment notes  2. Do you have or have access to (through a family member/friend) a smartphone with video capability that we can use for your visit?" a. If yes - list this number in appt notes as cell (if different from BEST phone #) and list the appointment type as a VIDEO visit in appointment notes b. If no - list the appointment type as a PHONE visit in appointment notes  3. Confirm consent - "In the setting of the current Covid19 crisis, you are scheduled for a (phone or video) visit with your provider on (date) at (time).  Just as we do with many in-office visits, in order for you to participate in this visit, we must obtain consent.  If you'd like, I can send this to your mychart (if signed up) or email for you to review.  Otherwise, I can obtain your verbal consent now.  All virtual visits are billed to your insurance company just like a normal visit would be.  By agreeing to a virtual visit, we'd like you to understand that the technology does not allow for your provider to perform an examination, and thus may limit your provider's ability to fully assess your condition. If your provider identifies any concerns that need to be evaluated in person, we will make arrangements to do so.  Finally, though the technology is pretty good, we cannot assure that it will always work on either your or our end, and in the setting of a video visit, we may have to convert it to a phone-only visit.  In either situation, we cannot ensure that we have a secure connection.  Are you willing to proceed?" STAFF: Did the patient verbally acknowledge consent to telehealth visit? Document  YES/NO here: Yes  4. Advise patient to be prepared - "Two hours prior to your appointment, go ahead and check your blood pressure, pulse, oxygen saturation, and your weight (if you have the equipment to check those) and write them all down. When your visit starts, your provider will ask you for this information. If you have an Apple Watch or Kardia device, please plan to have heart rate information ready on the day of your appointment. Please have a pen and paper handy nearby the day of the visit as well."  5. Give patient instructions for MyChart download to smartphone OR [email or web address] as below if video visit (depending on what platform provider is using)  6. Inform patient they will receive a phone call 15 minutes prior to their appointment time (may be from unknown caller ID) so they should be prepared to answer    TELEPHONE CALL NOTE  Roger Hansen has been deemed a candidate for a follow-up tele-health visit to limit community exposure during the Covid-19 pandemic. I spoke with the patient via phone to ensure availability of phone/video source, confirm preferred email & phone number, and discuss instructions and expectations.  I reminded Roger Hansen to be prepared with any vital sign and/or heart rhythm information that could potentially be obtained via home monitoring, at the time of his visit. I reminded Roger Hansen to expect a phone call prior to  his visit.  [name] 07/26/2019 2:01 PM

## 2019-08-01 ENCOUNTER — Inpatient Hospital Stay (HOSPITAL_COMMUNITY): Payer: Medicare Other

## 2019-08-01 ENCOUNTER — Other Ambulatory Visit: Payer: Self-pay

## 2019-08-01 DIAGNOSIS — N183 Chronic kidney disease, stage 3 (moderate): Secondary | ICD-10-CM | POA: Diagnosis not present

## 2019-08-01 DIAGNOSIS — D631 Anemia in chronic kidney disease: Secondary | ICD-10-CM | POA: Diagnosis not present

## 2019-08-01 DIAGNOSIS — D649 Anemia, unspecified: Secondary | ICD-10-CM

## 2019-08-01 LAB — CBC
HCT: 38.5 % — ABNORMAL LOW (ref 39.0–52.0)
Hemoglobin: 11.6 g/dL — ABNORMAL LOW (ref 13.0–17.0)
MCH: 27.4 pg (ref 26.0–34.0)
MCHC: 30.1 g/dL (ref 30.0–36.0)
MCV: 91 fL (ref 80.0–100.0)
Platelets: 253 10*3/uL (ref 150–400)
RBC: 4.23 MIL/uL (ref 4.22–5.81)
RDW: 16.6 % — ABNORMAL HIGH (ref 11.5–15.5)
WBC: 4.1 10*3/uL (ref 4.0–10.5)
nRBC: 0 % (ref 0.0–0.2)

## 2019-08-01 NOTE — Progress Notes (Signed)
1030 Hgb 11.6 today. Epogen injection held today per parameters. Reviewed this information with the pt who verbalized understanding

## 2019-08-03 ENCOUNTER — Telehealth: Payer: Self-pay | Admitting: Licensed Clinical Social Worker

## 2019-08-03 ENCOUNTER — Telehealth (INDEPENDENT_AMBULATORY_CARE_PROVIDER_SITE_OTHER): Payer: Medicare Other | Admitting: Cardiovascular Disease

## 2019-08-03 ENCOUNTER — Encounter: Payer: Self-pay | Admitting: Cardiovascular Disease

## 2019-08-03 VITALS — Ht 65.0 in | Wt 178.0 lb

## 2019-08-03 DIAGNOSIS — I429 Cardiomyopathy, unspecified: Secondary | ICD-10-CM

## 2019-08-03 DIAGNOSIS — I10 Essential (primary) hypertension: Secondary | ICD-10-CM

## 2019-08-03 DIAGNOSIS — D649 Anemia, unspecified: Secondary | ICD-10-CM

## 2019-08-03 DIAGNOSIS — R0789 Other chest pain: Secondary | ICD-10-CM

## 2019-08-03 NOTE — Progress Notes (Signed)
Virtual Visit via Telephone Note   This visit type was conducted due to national recommendations for restrictions regarding the COVID-19 Pandemic (e.g. social distancing) in an effort to limit this patient's exposure and mitigate transmission in our community.  Due to his co-morbid illnesses, this patient is at least at moderate risk for complications without adequate follow up.  This format is felt to be most appropriate for this patient at this time.  The patient did not have access to video technology/had technical difficulties with video requiring transitioning to audio format only (telephone).  All issues noted in this document were discussed and addressed.  No physical exam could be performed with this format.  Please refer to the patient's chart for his  consent to telehealth for [facility].   Date:  08/03/2019   ID:  Roger Hansen, DOB May 03, 1946, MRN [id number]  Patient Location: Home Provider Location: Home  PCP:  Curlene Labrum, MD  Cardiologist:  Kate Sable, MD  Electrophysiologist:  None   Evaluation Performed:  Follow-Up Visit  Chief Complaint:  Chest pain  History of Present Illness:    Roger Hansen is a 73 y.o. male with past medical history of HTN, HLD, chronic anemia and alcohol abuse.  He underwent a nuclear stress test on 08/17/2018 at [facility].  There was no reversible ischemia or infarction.  There was mild inferolateral and inferior wall hypokinesis, LVEF 48%.  Echocardiogram on 09/23/2018 and showed mild to moderate LVH, EF of 50%, and probable hypokinesis of the basal inferior lateral and inferior myocardium.  He did have mild AI and trivial TR.  He has PTSD and depression.  He said it has been several months since he had a drink.  He denies chest pain and shortness of breath.  He has chronic bilateral leg edema which is stable for which he takes Lasix.  He denies orthopnea and paroxysmal nocturnal dyspnea.  He does not own a BP cuff.  The patient does not have symptoms concerning for COVID-19 infection (fever, chills, cough, or new shortness of breath).    Past Medical History:  Diagnosis Date  . Alcohol abuse 11/24/2016  . Anemia 10/11/2013  . Chronic renal disease, stage 3, moderately decreased glomerular filtration rate (GFR) between 30-59 mL/min/1.73 square meter (HCC) 12/02/2016  . Colon polyps   . GERD (gastroesophageal reflux disease)   . High cholesterol   . Hypertension   . Sinus infection    Past Surgical History:  Procedure Laterality Date  . BIOPSY  06/03/2019   Procedure: BIOPSY;  Surgeon: Rogene Houston, MD;  [facility];  Service: Endoscopy;;  esophagus  . COLONOSCOPY N/A 10/26/2013   Procedure: COLONOSCOPY;  Surgeon: Rogene Houston, MD;  [facility];  Service: Endoscopy;  Laterality: N/A;  325-moved to 1230 Ann to notify pt  . Colonoscopy with polypectomy    . COLONOSCOPY WITH PROPOFOL N/A 06/03/2019   Procedure: COLONOSCOPY WITH PROPOFOL;  Surgeon: Rogene Houston, MD;  [facility];  Service: Endoscopy;  Laterality: N/A;  . ESOPHAGOGASTRODUODENOSCOPY (EGD) WITH PROPOFOL N/A 06/03/2019   Procedure: ESOPHAGOGASTRODUODENOSCOPY (EGD) WITH PROPOFOL;  Surgeon: Rogene Houston, MD;  [facility];  Service: Endoscopy;  Laterality: N/A;  . POLYPECTOMY  06/03/2019   Procedure: POLYPECTOMY;  Surgeon: Rogene Houston, MD;  [facility];  Service: Endoscopy;;  colon     Current Meds  Medication Sig  . aspirin 81 MG tablet Take 1 tablet (  81 mg total) by mouth daily.  Marland Kitchen atorvastatin (LIPITOR) 10 MG tablet Take 10 mg by mouth daily.  Marland Kitchen buPROPion (WELLBUTRIN SR) 150 MG 12 hr tablet Take 150 mg by mouth daily.   . busPIRone (BUSPAR) 30 MG tablet Take 30 mg by mouth 3 (three) times daily.  . cholecalciferol (VITAMIN D) 1000 UNITS tablet Take 1,000 Units by mouth 2 (two) times daily.  . Ferrous Sulfate (IRON) 325 (65 Fe) MG TABS TAKE ONE TABLET BY MOUTH  DAILY WITH BREAKFAST. (Patient taking differently: 1 tablet 2 (two) times a day. )  . folic acid (FOLVITE) 1 MG tablet Take 1 mg by mouth daily.  . furosemide (LASIX) 40 MG tablet Take 40 mg by mouth daily.  Marland Kitchen gabapentin (NEURONTIN) 300 MG capsule Take 300 mg by mouth 3 (three) times daily as needed.  Marland Kitchen GNP VITAMIN B-1 100 MG tablet Take 100 mg by mouth daily.   Marland Kitchen losartan (COZAAR) 50 MG tablet Take 50 mg by mouth daily.  Marland Kitchen omeprazole (PRILOSEC) 20 MG capsule Take 20 mg by mouth daily as needed (Acid Reflux).  . potassium chloride SA (K-DUR) 20 MEQ tablet Take 20 mEq by mouth daily.   . traZODone (DESYREL) 100 MG tablet Take 100 mg by mouth at bedtime.  . vitamin B-12 (CYANOCOBALAMIN) 1000 MCG tablet Take 1,000 mcg by mouth daily.  . ziprasidone (GEODON) 40 MG capsule Take 40 mg by mouth 2 (two) times daily with a meal.      Allergies:   Horseradish [armoracia rusticana ext (horseradish)]   Social History   Tobacco Use  . Smoking status: Former Smoker    Packs/day: 0.50    Years: 20.00    Pack years: 10.00    Quit date: 12/27/1971    Years since quitting: 47.6  . Smokeless tobacco: Never Used  Substance Use Topics  . Alcohol use: Yes    Comment: 1 pint of etoh a day ( not recently)  . Drug use: No     Family Hx: The patient's family history includes Hypertension in his mother. There is no history of Colon cancer.  ROS:   Please see the history of present illness.     All other systems reviewed and are negative.   Prior CV studies:   The following studies were reviewed today:  NST: 08/2018   Echocardiogram: 09/23/2018 Study Conclusions  - Left ventricle: The cavity size was normal. Wall thickness was increased increased in a pattern of mild to moderate LVH. The estimated ejection fraction was 50%. Probable hypokinesis of the basalinferolateral and inferior myocardium. Indeterminate diastolic function. - Aortic valve: Mildly calcified annulus. Trileaflet.  There was mild regurgitation. - Mitral valve: There was mild regurgitation. - Right atrium: Central venous pressure (est): 3 mm Hg. - Atrial septum: No defect or patent foramen ovale was identified. - Tricuspid valve: There was trivial regurgitation. - Pulmonary arteries: Systolic pressure could not be accurately estimated. - Pericardium, extracardiac: There was no pericardial effusion.  Labs/Other Tests and Data Reviewed:    EKG:  No ECG reviewed.  Recent Labs: 06/20/2019: ALT 9; BUN 18; Creatinine, Ser 0.83; Potassium 4.7; Sodium 137 08/01/2019: Hemoglobin 11.6; Platelets 253   Recent Lipid Panel No results found for: CHOL, TRIG, HDL, CHOLHDL, LDLCALC, LDLDIRECT  Wt Readings from Last 3 Encounters:  08/03/19 178 lb (80.7 kg)  06/20/19 175 lb 8 oz (79.6 kg)  05/26/19 172 lb (78 kg)     Objective:    Vital Signs:  Ht 5'  5" (1.651 m)   Wt 178 lb (80.7 kg)   BMI 29.62 kg/m    VITAL SIGNS:  reviewed  ASSESSMENT & PLAN:    1.  Atypical chest pain: Nuclear stress test reviewed above with no evidence of ischemia or infarction.  Prior symptoms may have been due to a GI etiology. No symptom recurrence.  2.  Cardiomyopathy: LVEF 50% with wall motion abnormalities as noted above.  Likely alcohol induced as he used to consume a pint to 1/5 of liquor on a daily basis for over 30 years.  Currently on an angiotensin receptor blocker. Takes Lasix 40 mg daily and supplemental KCl. He no longer consumes alcohol.  3.  Hypertension: We will order him a BP cuff.  4.  Normocytic anemia: Followed by hematology. Hgb 11.6 on 08/01/19.    COVID-19 Education: The signs and symptoms of COVID-19 were discussed with the patient and how to seek care for testing (follow up with PCP or arrange E-visit).  The importance of social distancing was discussed today.  Time:   Today, I have spent 10 minutes with the patient with telehealth technology discussing the above problems.     Medication  Adjustments/Labs and Tests Ordered: Current medicines are reviewed at length with the patient today.  Concerns regarding medicines are outlined above.   Tests Ordered: No orders of the defined types were placed in this encounter.   Medication Changes: No orders of the defined types were placed in this encounter.   Follow Up:  Virtual Visit or In Person in 1 year(s)  Signed, Kate Sable, MD  08/03/2019 8:36 AM    [place]

## 2019-08-03 NOTE — Telephone Encounter (Signed)
CSW referred to assist patient with obtaining a BP cuff. CSW contacted patient to inform cuff will be delivered to home. Patient grateful for support and assistance. CSW available as needed. Jackie Grainne Knights, LCSW, CCSW-MCS [phone]  

## 2019-08-03 NOTE — Patient Instructions (Signed)
Medication Instructions: Your physician recommends that you continue on your current medications as directed. Please refer to the Current Medication list given to you today.   Labwork: none  Procedures/Testing: none  Follow-Up: 1 year with Tanzania Strader PA-C, you can have office visit or Virtual visit  Any Additional Special Instructions Will Be Listed Below (If Applicable).     If you need a refill on your cardiac medications before your next appointment, please call your pharmacy.     Thank you for choosing [facility] !

## 2019-08-08 ENCOUNTER — Other Ambulatory Visit: Payer: Self-pay

## 2019-08-08 ENCOUNTER — Inpatient Hospital Stay (HOSPITAL_COMMUNITY): Payer: Medicare Other

## 2019-08-08 DIAGNOSIS — D649 Anemia, unspecified: Secondary | ICD-10-CM

## 2019-08-08 DIAGNOSIS — N183 Chronic kidney disease, stage 3 (moderate): Secondary | ICD-10-CM | POA: Diagnosis not present

## 2019-08-08 DIAGNOSIS — D631 Anemia in chronic kidney disease: Secondary | ICD-10-CM | POA: Diagnosis not present

## 2019-08-08 LAB — CBC
HCT: 38.2 % — ABNORMAL LOW (ref 39.0–52.0)
Hemoglobin: 11.4 g/dL — ABNORMAL LOW (ref 13.0–17.0)
MCH: 27.5 pg (ref 26.0–34.0)
MCHC: 29.8 g/dL — ABNORMAL LOW (ref 30.0–36.0)
MCV: 92 fL (ref 80.0–100.0)
Platelets: 229 10*3/uL (ref 150–400)
RBC: 4.15 MIL/uL — ABNORMAL LOW (ref 4.22–5.81)
RDW: 15.6 % — ABNORMAL HIGH (ref 11.5–15.5)
WBC: 4.1 10*3/uL (ref 4.0–10.5)
nRBC: 0 % (ref 0.0–0.2)

## 2019-08-08 MED ORDER — PEGFILGRASTIM-CBQV 6 MG/0.6ML ~~LOC~~ SOSY
PREFILLED_SYRINGE | SUBCUTANEOUS | Status: AC
  Filled 2019-08-08: qty 0.6

## 2019-08-08 NOTE — Progress Notes (Signed)
Roger Hansen presents today for Procrit injection. Hgb 11.4. Injection held per parameters. Pt made aware and copy of lab results given to patient. Discharged with follow up instructions.

## 2019-08-12 DIAGNOSIS — N183 Chronic kidney disease, stage 3 unspecified: Secondary | ICD-10-CM | POA: Insufficient documentation

## 2019-08-12 NOTE — Progress Notes (Signed)
08/15/19  .The following biosimilar epoetin-epbx (Retacrit) has been selected for use in this patient.  Roger Hansen, PharmD

## 2019-08-15 ENCOUNTER — Inpatient Hospital Stay (HOSPITAL_COMMUNITY): Payer: Medicare Other

## 2019-08-15 ENCOUNTER — Other Ambulatory Visit: Payer: Self-pay

## 2019-08-15 ENCOUNTER — Inpatient Hospital Stay (HOSPITAL_BASED_OUTPATIENT_CLINIC_OR_DEPARTMENT_OTHER): Payer: Medicare Other | Admitting: Hematology

## 2019-08-15 DIAGNOSIS — D500 Iron deficiency anemia secondary to blood loss (chronic): Secondary | ICD-10-CM | POA: Diagnosis not present

## 2019-08-15 DIAGNOSIS — N183 Chronic kidney disease, stage 3 unspecified: Secondary | ICD-10-CM

## 2019-08-15 DIAGNOSIS — D631 Anemia in chronic kidney disease: Secondary | ICD-10-CM | POA: Diagnosis not present

## 2019-08-15 DIAGNOSIS — D649 Anemia, unspecified: Secondary | ICD-10-CM

## 2019-08-15 LAB — CBC
HCT: 36.4 % — ABNORMAL LOW (ref 39.0–52.0)
Hemoglobin: 10.9 g/dL — ABNORMAL LOW (ref 13.0–17.0)
MCH: 27.8 pg (ref 26.0–34.0)
MCHC: 29.9 g/dL — ABNORMAL LOW (ref 30.0–36.0)
MCV: 92.9 fL (ref 80.0–100.0)
Platelets: 211 10*3/uL (ref 150–400)
RBC: 3.92 MIL/uL — ABNORMAL LOW (ref 4.22–5.81)
RDW: 15.6 % — ABNORMAL HIGH (ref 11.5–15.5)
WBC: 4.5 10*3/uL (ref 4.0–10.5)
nRBC: 0 % (ref 0.0–0.2)

## 2019-08-15 NOTE — Progress Notes (Signed)
[place], [place] [address]   CLINIC:  Medical Oncology/Hematology  PCP:  Curlene Labrum, MD [place] [address] [phone]   REASON FOR VISIT:  Follow-up for IDA  CURRENT THERAPY: oral iron, weekly Procrit     INTERVAL HISTORY:  Mr. Steinback 73 y.o. male presents today for follow-up.  He reports overall doing well.  He states he has quit drinking alcohol and has been sober for at least a month now.  He states his PCP is helping him in this process.  Patient has been diagnosed with underlying depression secondary to [place] War and the passing of his daughter.  He is raising his 2 grandchildren ages 17 and 10.  He denies any significant fatigue.  He denies any obvious signs of bleeding.  Denies any change in appetite.  No change in bowel habits.  Denies any chest pain, shortness of breath, lightheadedness or dizziness.  He is here for repeat labs and office visit.   REVIEW OF SYSTEMS:  Review of Systems  Constitutional: Negative.   HENT:  Negative.   Eyes: Negative.   Respiratory: Negative.   Cardiovascular: Negative.   Gastrointestinal: Negative.   Endocrine: Negative.   Genitourinary: Negative.    Musculoskeletal: Positive for arthralgias.  Skin: Negative.   Neurological: Positive for extremity weakness.  Psychiatric/Behavioral: Positive for depression.     PAST MEDICAL/SURGICAL HISTORY:  Past Medical History:  Diagnosis Date  . Alcohol abuse 11/24/2016  . Anemia 10/11/2013  . Chronic renal disease, stage 3, moderately decreased glomerular filtration rate (GFR) between 30-59 mL/min/1.73 square meter (HCC) 12/02/2016  . Colon polyps   . GERD (gastroesophageal reflux disease)   . High cholesterol   . Hypertension   . Sinus infection    Past Surgical History:  Procedure Laterality Date  . BIOPSY  06/03/2019   Procedure: BIOPSY;  Surgeon: Rogene Houston, MD;  [facility];  Service: Endoscopy;;   esophagus  . COLONOSCOPY N/A 10/26/2013   Procedure: COLONOSCOPY;  Surgeon: Rogene Houston, MD;  [facility];  Service: Endoscopy;  Laterality: N/A;  325-moved to 1230 Ann to notify pt  . Colonoscopy with polypectomy    . COLONOSCOPY WITH PROPOFOL N/A 06/03/2019   Procedure: COLONOSCOPY WITH PROPOFOL;  Surgeon: Rogene Houston, MD;  [facility];  Service: Endoscopy;  Laterality: N/A;  . ESOPHAGOGASTRODUODENOSCOPY (EGD) WITH PROPOFOL N/A 06/03/2019   Procedure: ESOPHAGOGASTRODUODENOSCOPY (EGD) WITH PROPOFOL;  Surgeon: Rogene Houston, MD;  [facility];  Service: Endoscopy;  Laterality: N/A;  . POLYPECTOMY  06/03/2019   Procedure: POLYPECTOMY;  Surgeon: Rogene Houston, MD;  [facility];  Service: Endoscopy;;  colon     SOCIAL HISTORY:  Social History   Socioeconomic History  . Marital status: Married    Spouse name: Not on file  . Number of children: Not on file  . Years of education: Not on file  . Highest education level: Not on file  Occupational History  . Not on file  Social Needs  . Financial resource strain: Not on file  . Food insecurity    Worry: Not on file    Inability: Not on file  . Transportation needs    Medical: Not on file    Non-medical: Not on file  Tobacco Use  . Smoking status: Former Smoker    Packs/day: 0.50    Years: 20.00    Pack years:  10.00    Quit date: 12/27/1971    Years since quitting: 47.6  . Smokeless tobacco: Never Used  Substance and Sexual Activity  . Alcohol use: Yes    Comment: 1 pint of etoh a day ( not recently)  . Drug use: No  . Sexual activity: Not on file  Lifestyle  . Physical activity    Days per week: Not on file    Minutes per session: Not on file  . Stress: Not on file  Relationships  . Social Herbalist on phone: Not on file    Gets together: Not on file    Attends religious service: Not on file    Active member of club or organization: Not on file     Attends meetings of clubs or organizations: Not on file    Relationship status: Not on file  . Intimate partner violence    Fear of current or ex partner: Not on file    Emotionally abused: Not on file    Physically abused: Not on file    Forced sexual activity: Not on file  Other Topics Concern  . Not on file  Social History Narrative  . Not on file    FAMILY HISTORY:  Family History  Problem Relation Age of Onset  . Hypertension Mother   . Colon cancer Neg Hx     CURRENT MEDICATIONS:  Outpatient Encounter Medications as of 08/15/2019  Medication Sig Note  . aspirin 81 MG tablet Take 1 tablet (81 mg total) by mouth daily.   Marland Kitchen atorvastatin (LIPITOR) 10 MG tablet Take 10 mg by mouth daily.   Marland Kitchen buPROPion (WELLBUTRIN SR) 150 MG 12 hr tablet Take 150 mg by mouth daily.    . busPIRone (BUSPAR) 30 MG tablet Take 30 mg by mouth 3 (three) times daily.   . cholecalciferol (VITAMIN D) 1000 UNITS tablet Take 1,000 Units by mouth 2 (two) times daily.   . Ferrous Sulfate (IRON) 325 (65 Fe) MG TABS TAKE ONE TABLET BY MOUTH DAILY WITH BREAKFAST. (Patient taking differently: 1 tablet 2 (two) times a day. )   . folic acid (FOLVITE) 1 MG tablet Take 1 mg by mouth daily.   . furosemide (LASIX) 40 MG tablet Take 40 mg by mouth daily.   Marland Kitchen gabapentin (NEURONTIN) 300 MG capsule Take 300 mg by mouth 3 (three) times daily as needed. 10/21/2016: Received from: External Pharmacy  . GNP VITAMIN B-1 100 MG tablet Take 100 mg by mouth daily.    Marland Kitchen losartan (COZAAR) 50 MG tablet Take 50 mg by mouth daily.   Marland Kitchen omeprazole (PRILOSEC) 20 MG capsule Take 20 mg by mouth daily as needed (Acid Reflux).   . potassium chloride SA (K-DUR) 20 MEQ tablet Take 20 mEq by mouth daily.    . traZODone (DESYREL) 100 MG tablet Take 100 mg by mouth at bedtime.   . vitamin B-12 (CYANOCOBALAMIN) 1000 MCG tablet Take 1,000 mcg by mouth daily.   . ziprasidone (GEODON) 40 MG capsule Take 40 mg by mouth 2 (two) times daily with a meal.      No facility-administered encounter medications on file as of 08/15/2019.     ALLERGIES:  Allergies  Allergen Reactions  . Horseradish [Armoracia Rusticana Ext (Horseradish)] Anaphylaxis     PHYSICAL EXAM:  ECOG Performance status: 1  Vitals:   08/15/19 1013  BP: 130/62  Pulse: 66  Resp: 16  Temp: (!) 97.3 F (36.3 C)  SpO2: 100%  Filed Weights   08/15/19 1013  Weight: 184 lb 4.8 oz (83.6 kg)    Physical Exam Constitutional:      Appearance: Normal appearance. He is obese.  HENT:     Head: Normocephalic.     Nose: Nose normal.     Mouth/Throat:     Mouth: Mucous membranes are moist.     Pharynx: Oropharynx is clear.  Eyes:     Extraocular Movements: Extraocular movements intact.     Conjunctiva/sclera: Conjunctivae normal.  Neck:     Musculoskeletal: Normal range of motion.  Cardiovascular:     Rate and Rhythm: Normal rate and regular rhythm.     Pulses: Normal pulses.     Heart sounds: Normal heart sounds.  Pulmonary:     Effort: Pulmonary effort is normal.     Breath sounds: Normal breath sounds.  Abdominal:     General: Bowel sounds are normal.  Musculoskeletal: Normal range of motion.  Skin:    General: Skin is warm and dry.  Neurological:     General: No focal deficit present.     Mental Status: He is alert and oriented to person, place, and time.  Psychiatric:        Mood and Affect: Mood normal.        Behavior: Behavior normal.        Thought Content: Thought content normal.        Judgment: Judgment normal.      LABORATORY DATA:  I have reviewed the labs as listed.  CBC    Component Value Date/Time   WBC 4.5 08/15/2019 1001   RBC 3.92 (L) 08/15/2019 1001   HGB 10.9 (L) 08/15/2019 1001   HCT 36.4 (L) 08/15/2019 1001   HCT 27.9 (L) 08/13/2016 0944   PLT 211 08/15/2019 1001   MCV 92.9 08/15/2019 1001   MCH 27.8 08/15/2019 1001   MCHC 29.9 (L) 08/15/2019 1001   RDW 15.6 (H) 08/15/2019 1001   LYMPHSABS 1.5 06/20/2019 1213    MONOABS 0.5 06/20/2019 1213   EOSABS 0.3 06/20/2019 1213   BASOSABS 0.0 06/20/2019 1213   CMP Latest Ref Rng & Units 06/20/2019 05/25/2019 10/12/2018  Glucose 70 - 99 mg/dL 89 89 134(H)  BUN 8 - 23 mg/dL 18 11 13   Creatinine 0.61 - 1.24 mg/dL 0.83 0.94 1.04  Sodium 135 - 145 mmol/L 137 140 134(L)  Potassium 3.5 - 5.1 mmol/L 4.7 4.0 3.5  Chloride 98 - 111 mmol/L 103 104 95(L)  CO2 22 - 32 mmol/L 26 25 30   Calcium 8.9 - 10.3 mg/dL 9.5 8.9 9.8  Total Protein 6.5 - 8.1 g/dL 7.2 6.6 7.6  Total Bilirubin 0.3 - 1.2 mg/dL 0.3 0.8 1.8(H)  Alkaline Phos 38 - 126 U/L 56 53 79  AST 15 - 41 U/L 13(L) 18 37  ALT 0 - 44 U/L 9 11 16        ASSESSMENT & PLAN:   Iron deficiency anemia due to chronic blood loss 1.  Normocytic normochromic anemia: - It is felt that his chronic alcohol abuse is felt to be the contributing factor to his anemia given its toxicity on the bone marrow. - His anemia dates back to at least 2014 with a negative peripheral work-up, negative bone marrow aspiration and biopsy and cytogenetics on 10/16/2016 in the setting of alcohol abuse. -It was reported that he was on iron tablets in the past and tolerated fairly well.  He was placed back on oral iron.  He is  taking without problems. -Labs on 05/25/2019 showed his hemoglobin is 7.7, ferritin 136, percent saturation 17, B12 407, and platelets 318. - He is a Jehovah witness and does not receive blood products.  He does accept platelets but his platelets are good at 318. - He received IV iron on June 11 and June 02, 2019 with no adverse reactions.  He is also been receiving weekly Procrit 20,000 units.  His hemoglobin has improved from 7.7-10.4.   - Hemoglobin has remained stable and above 10 g/dL over the last 3 months.  Recommend patient proceed with every other week Procrit 20,000 units.  2.  EtOH abuse: - Patient has now stopped drinking.      Orders placed this encounter:  Orders Placed This Encounter  Procedures  . CBC  with Differential  . Comprehensive metabolic panel  . Vitamin B12  . Ferritin  . Iron and TIBC      Roger Shelter, [place] [phone]

## 2019-08-15 NOTE — Progress Notes (Signed)
Hgb 10.9 today.  Patient does not need injection verbal order Reynolds Bowl, NP.

## 2019-08-15 NOTE — Assessment & Plan Note (Signed)
1.  Normocytic normochromic anemia: - It is felt that his chronic alcohol abuse is felt to be the contributing factor to his anemia given its toxicity on the bone marrow. - His anemia dates back to at least 2014 with a negative peripheral work-up, negative bone marrow aspiration and biopsy and cytogenetics on 10/16/2016 in the setting of alcohol abuse. -It was reported that he was on iron tablets in the past and tolerated fairly well.  He was placed back on oral iron.  He is taking without problems. -Labs on 05/25/2019 showed his hemoglobin is 7.7, ferritin 136, percent saturation 17, B12 407, and platelets 318. - He is a Jehovah witness and does not receive blood products.  He does accept platelets but his platelets are good at 318. - He received IV iron on June 11 and June 02, 2019 with no adverse reactions.  He is also been receiving weekly Procrit 20,000 units.  His hemoglobin has improved from 7.7-10.4.   - Hemoglobin has remained stable and above 10 g/dL over the last 3 months.  Recommend patient proceed with every other week Procrit 20,000 units.  2.  EtOH abuse: - Patient has now stopped drinking.

## 2019-08-29 ENCOUNTER — Inpatient Hospital Stay (HOSPITAL_COMMUNITY): Payer: Medicare Other

## 2019-08-29 ENCOUNTER — Inpatient Hospital Stay (HOSPITAL_COMMUNITY): Payer: Medicare Other | Attending: Hematology

## 2019-08-29 ENCOUNTER — Other Ambulatory Visit: Payer: Self-pay

## 2019-08-29 VITALS — BP 124/61 | HR 66 | Temp 97.5°F | Resp 16

## 2019-08-29 DIAGNOSIS — N183 Chronic kidney disease, stage 3 unspecified: Secondary | ICD-10-CM

## 2019-08-29 DIAGNOSIS — D649 Anemia, unspecified: Secondary | ICD-10-CM

## 2019-08-29 DIAGNOSIS — D631 Anemia in chronic kidney disease: Secondary | ICD-10-CM | POA: Insufficient documentation

## 2019-08-29 LAB — CBC
HCT: 33.1 % — ABNORMAL LOW (ref 39.0–52.0)
Hemoglobin: 9.9 g/dL — ABNORMAL LOW (ref 13.0–17.0)
MCH: 27.5 pg (ref 26.0–34.0)
MCHC: 29.9 g/dL — ABNORMAL LOW (ref 30.0–36.0)
MCV: 91.9 fL (ref 80.0–100.0)
Platelets: 211 10*3/uL (ref 150–400)
RBC: 3.6 MIL/uL — ABNORMAL LOW (ref 4.22–5.81)
RDW: 14.8 % (ref 11.5–15.5)
WBC: 5.2 10*3/uL (ref 4.0–10.5)
nRBC: 0 % (ref 0.0–0.2)

## 2019-08-29 MED ORDER — EPOETIN ALFA-EPBX 10000 UNIT/ML IJ SOLN
20000.0000 [IU] | Freq: Once | INTRAMUSCULAR | Status: AC
  Administered 2019-08-29: 20000 [IU] via SUBCUTANEOUS
  Filled 2019-08-29: qty 2

## 2019-08-29 NOTE — Patient Instructions (Signed)
[place] at [facility]  Discharge Instructions:  You received your Retacrit injection today. Your hemoglobin was 9.9. Follow up in 2 weeks as scheduled.   Epoetin Alfa injection What is this medicine? EPOETIN ALFA (e POE e tin AL fa) helps your body make more red blood cells. This medicine is used to treat anemia caused by chronic kidney disease, cancer chemotherapy, or HIV-therapy. It may also be used before surgery if you have anemia. This medicine may be used for other purposes; ask your health care provider or pharmacist if you have questions. COMMON BRAND NAME(S): Epogen, Procrit, Retacrit What should I tell my health care provider before I take this medicine? They need to know if you have any of these conditions:  cancer  heart disease  high blood pressure  history of blood clots  history of stroke  low levels of folate, iron, or vitamin B12 in the blood  seizures  an unusual or allergic reaction to erythropoietin, albumin, benzyl alcohol, hamster proteins, other medicines, foods, dyes, or preservatives  pregnant or trying to get pregnant  breast-feeding How should I use this medicine? This medicine is for injection into a vein or under the skin. It is usually given by a health care professional in a hospital or clinic setting. If you get this medicine at home, you will be taught how to prepare and give this medicine. Use exactly as directed. Take your medicine at regular intervals. Do not take your medicine more often than directed. It is important that you put your used needles and syringes in a special sharps container. Do not put them in a trash can. If you do not have a sharps container, call your pharmacist or healthcare provider to get one. A special MedGuide will be given to you by the pharmacist with each prescription and refill. Be sure to read this information carefully each time. Talk to your pediatrician regarding the use of this  medicine in children. While this drug may be prescribed for selected conditions, precautions do apply. Overdosage: If you think you have taken too much of this medicine contact a poison control center or emergency room at once. NOTE: This medicine is only for you. Do not share this medicine with others. What if I miss a dose? If you miss a dose, take it as soon as you can. If it is almost time for your next dose, take only that dose. Do not take double or extra doses. What may interact with this medicine? Interactions have not been studied. This list may not describe all possible interactions. Give your health care provider a list of all the medicines, herbs, non-prescription drugs, or dietary supplements you use. Also tell them if you smoke, drink alcohol, or use illegal drugs. Some items may interact with your medicine. What should I watch for while using this medicine? Your condition will be monitored carefully while you are receiving this medicine. You may need blood work done while you are taking this medicine. This medicine may cause a decrease in vitamin B6. You should make sure that you get enough vitamin B6 while you are taking this medicine. Discuss the foods you eat and the vitamins you take with your health care professional. What side effects may I notice from receiving this medicine? Side effects that you should report to your doctor or health care professional as soon as possible:  allergic reactions like skin rash, itching or hives, swelling of the face, lips, or tongue  seizures  signs and symptoms of a blood clot such as breathing problems; changes in vision; chest pain; severe, sudden headache; pain, swelling, warmth in the leg; trouble speaking; sudden numbness or weakness of the face, arm or leg  signs and symptoms of a stroke like changes in vision; confusion; trouble speaking or understanding; severe headaches; sudden numbness or weakness of the face, arm or leg; trouble  walking; dizziness; loss of balance or coordination Side effects that usually do not require medical attention (report to your doctor or health care professional if they continue or are bothersome):  chills  cough  dizziness  fever  headaches  joint pain  muscle cramps  muscle pain  nausea, vomiting  pain, redness, or irritation at site where injected This list may not describe all possible side effects. Call your doctor for medical advice about side effects. You may report side effects to FDA at 1-800-FDA-1088. Where should I keep my medicine? Keep out of the reach of children. Store in a refrigerator between 2 and 8 degrees C (36 and 46 degrees F). Do not freeze or shake. Throw away any unused portion if using a single-dose vial. Multi-dose vials can be kept in the refrigerator for up to 21 days after the initial dose. Throw away unused medicine. NOTE: This sheet is a summary. It may not cover all possible information. If you have questions about this medicine, talk to your doctor, pharmacist, or health care provider.  2020 Elsevier/Gold Standard (2017-07-10 08:35:19)  _______________________________________________________________  Thank you for choosing [place] at [facility] to provide your oncology and hematology care.  To afford each patient quality time with our providers, please arrive at least 15 minutes before your scheduled appointment.  You need to re-schedule your appointment if you arrive 10 or more minutes late.  We strive to give you quality time with our providers, and arriving late affects you and other patients whose appointments are after yours.  Also, if you no show three or more times for appointments you may be dismissed from the clinic.  Again, thank you for choosing [place] at [facility] hope is that these requests will allow you access to exceptional care and in a timely  manner. _______________________________________________________________  If you have questions after your visit, please contact our office at [phone] between the hours of 8:30 a.m. and 5:00 p.m. Voicemails left after 4:30 p.m. will not be returned until the following business day. _______________________________________________________________  For prescription refill requests, have your pharmacy contact our office. _______________________________________________________________  Recommendations made by the consultant and any test results will be sent to your referring physician. _______________________________________________________________

## 2019-08-29 NOTE — Progress Notes (Signed)
Roger Hansen presents today for Retacrit injection. Hemoglobin reviewed prior to administration. VSS. Injection tolerated without incident or complaint. See MAR for details. Patient discharged in satisfactory condition with follow up instructions. 

## 2019-09-12 ENCOUNTER — Inpatient Hospital Stay (HOSPITAL_COMMUNITY): Payer: Medicare Other

## 2019-09-12 ENCOUNTER — Other Ambulatory Visit: Payer: Self-pay

## 2019-09-12 ENCOUNTER — Encounter (HOSPITAL_COMMUNITY): Payer: Self-pay

## 2019-09-12 VITALS — BP 124/62 | HR 72 | Temp 97.1°F | Resp 16

## 2019-09-12 DIAGNOSIS — D649 Anemia, unspecified: Secondary | ICD-10-CM

## 2019-09-12 DIAGNOSIS — D631 Anemia in chronic kidney disease: Secondary | ICD-10-CM | POA: Diagnosis not present

## 2019-09-12 DIAGNOSIS — N183 Chronic kidney disease, stage 3 unspecified: Secondary | ICD-10-CM

## 2019-09-12 LAB — CBC
HCT: 33.2 % — ABNORMAL LOW (ref 39.0–52.0)
Hemoglobin: 10.1 g/dL — ABNORMAL LOW (ref 13.0–17.0)
MCH: 28 pg (ref 26.0–34.0)
MCHC: 30.4 g/dL (ref 30.0–36.0)
MCV: 92 fL (ref 80.0–100.0)
Platelets: 244 10*3/uL (ref 150–400)
RBC: 3.61 MIL/uL — ABNORMAL LOW (ref 4.22–5.81)
RDW: 14.6 % (ref 11.5–15.5)
WBC: 7 10*3/uL (ref 4.0–10.5)
nRBC: 0 % (ref 0.0–0.2)

## 2019-09-12 MED ORDER — EPOETIN ALFA-EPBX 10000 UNIT/ML IJ SOLN
20000.0000 [IU] | Freq: Once | INTRAMUSCULAR | Status: AC
  Administered 2019-09-12: 10:00:00 20000 [IU] via SUBCUTANEOUS
  Filled 2019-09-12: qty 2

## 2019-09-12 NOTE — Patient Instructions (Signed)
[facility] at [facility]  Discharge Instructions:   _______________________________________________________________  Thank you for choosing [facility] at [facility] to provide your oncology and hematology care.  To afford each patient quality time with our providers, please arrive at least 15 minutes before your scheduled appointment.  You need to re-schedule your appointment if you arrive 10 or more minutes late.  We strive to give you quality time with our providers, and arriving late affects you and other patients whose appointments are after yours.  Also, if you no show three or more times for appointments you may be dismissed from the clinic.  Again, thank you for choosing [facility] at [facility]. Our hope is that these requests will allow you access to exceptional care and in a timely manner. _______________________________________________________________  If you have questions after your visit, please contact our office at [phone] between the hours of 8:30 a.m. and 5:00 p.m. Voicemails left after 4:30 p.m. will not be returned until the following business day. _______________________________________________________________  For prescription refill requests, have your pharmacy contact our office. _______________________________________________________________  Recommendations made by the consultant and any test results will be sent to your referring physician. _______________________________________________________________ 

## 2019-09-12 NOTE — Progress Notes (Signed)
Patient tolerated injection with no complaints voiced.  Site clean and dry with no bruising or swelling noted at site.  Band aid applied.  Vss with discharge and left ambulatory with no s/s of distress noted.  

## 2019-09-26 ENCOUNTER — Inpatient Hospital Stay (HOSPITAL_COMMUNITY): Payer: Medicare Other | Attending: Hematology

## 2019-09-26 ENCOUNTER — Encounter (HOSPITAL_COMMUNITY): Payer: Self-pay | Admitting: Hematology

## 2019-09-26 ENCOUNTER — Other Ambulatory Visit: Payer: Self-pay

## 2019-09-26 ENCOUNTER — Inpatient Hospital Stay (HOSPITAL_BASED_OUTPATIENT_CLINIC_OR_DEPARTMENT_OTHER): Payer: Medicare Other | Admitting: Hematology

## 2019-09-26 ENCOUNTER — Inpatient Hospital Stay (HOSPITAL_COMMUNITY): Payer: Medicare Other

## 2019-09-26 VITALS — BP 116/60 | HR 68 | Temp 97.9°F | Resp 18 | Wt 187.4 lb

## 2019-09-26 DIAGNOSIS — Z23 Encounter for immunization: Secondary | ICD-10-CM | POA: Insufficient documentation

## 2019-09-26 DIAGNOSIS — N183 Chronic kidney disease, stage 3 unspecified: Secondary | ICD-10-CM

## 2019-09-26 DIAGNOSIS — D649 Anemia, unspecified: Secondary | ICD-10-CM

## 2019-09-26 DIAGNOSIS — Z0000 Encounter for general adult medical examination without abnormal findings: Secondary | ICD-10-CM

## 2019-09-26 DIAGNOSIS — D631 Anemia in chronic kidney disease: Secondary | ICD-10-CM | POA: Diagnosis not present

## 2019-09-26 DIAGNOSIS — D500 Iron deficiency anemia secondary to blood loss (chronic): Secondary | ICD-10-CM

## 2019-09-26 LAB — CBC WITH DIFFERENTIAL/PLATELET
Abs Immature Granulocytes: 0.01 10*3/uL (ref 0.00–0.07)
Basophils Absolute: 0 10*3/uL (ref 0.0–0.1)
Basophils Relative: 1 %
Eosinophils Absolute: 0.2 10*3/uL (ref 0.0–0.5)
Eosinophils Relative: 5 %
HCT: 36.4 % — ABNORMAL LOW (ref 39.0–52.0)
Hemoglobin: 10.8 g/dL — ABNORMAL LOW (ref 13.0–17.0)
Immature Granulocytes: 0 %
Lymphocytes Relative: 29 %
Lymphs Abs: 1.3 10*3/uL (ref 0.7–4.0)
MCH: 28.1 pg (ref 26.0–34.0)
MCHC: 29.7 g/dL — ABNORMAL LOW (ref 30.0–36.0)
MCV: 94.5 fL (ref 80.0–100.0)
Monocytes Absolute: 0.4 10*3/uL (ref 0.1–1.0)
Monocytes Relative: 9 %
Neutro Abs: 2.5 10*3/uL (ref 1.7–7.7)
Neutrophils Relative %: 56 %
Platelets: 228 10*3/uL (ref 150–400)
RBC: 3.85 MIL/uL — ABNORMAL LOW (ref 4.22–5.81)
RDW: 14.7 % (ref 11.5–15.5)
WBC: 4.4 10*3/uL (ref 4.0–10.5)
nRBC: 0 % (ref 0.0–0.2)

## 2019-09-26 LAB — COMPREHENSIVE METABOLIC PANEL
ALT: 12 U/L (ref 0–44)
AST: 14 U/L — ABNORMAL LOW (ref 15–41)
Albumin: 4.2 g/dL (ref 3.5–5.0)
Alkaline Phosphatase: 79 U/L (ref 38–126)
Anion gap: 10 (ref 5–15)
BUN: 25 mg/dL — ABNORMAL HIGH (ref 8–23)
CO2: 22 mmol/L (ref 22–32)
Calcium: 9.2 mg/dL (ref 8.9–10.3)
Chloride: 104 mmol/L (ref 98–111)
Creatinine, Ser: 1.16 mg/dL (ref 0.61–1.24)
GFR calc Af Amer: >60 mL/min (ref >60)
GFR calc non Af Amer: >60 mL/min (ref >60)
Glucose, Bld: 94 mg/dL (ref 70–99)
Potassium: 4.5 mmol/L (ref 3.5–5.1)
Sodium: 136 mmol/L (ref 135–145)
Total Bilirubin: 0.6 mg/dL (ref 0.3–1.2)
Total Protein: 7.9 g/dL (ref 6.5–8.1)

## 2019-09-26 LAB — IRON AND TIBC
Iron: 75 ug/dL (ref 45–182)
Saturation Ratios: 25 % (ref 17.9–39.5)
TIBC: 300 ug/dL (ref 250–450)
UIBC: 225 ug/dL

## 2019-09-26 LAB — VITAMIN B12: Vitamin B-12: 756 pg/mL (ref 180–914)

## 2019-09-26 LAB — FERRITIN: Ferritin: 242 ng/mL (ref 24–336)

## 2019-09-26 MED ORDER — EPOETIN ALFA-EPBX 10000 UNIT/ML IJ SOLN
20000.0000 [IU] | Freq: Once | INTRAMUSCULAR | Status: AC
  Administered 2019-09-26: 11:00:00 20000 [IU] via SUBCUTANEOUS
  Filled 2019-09-26: qty 2

## 2019-09-26 MED ORDER — INFLUENZA VAC A&B SA ADJ QUAD 0.5 ML IM PRSY
0.5000 mL | PREFILLED_SYRINGE | Freq: Once | INTRAMUSCULAR | Status: AC
  Administered 2019-09-26: 0.5 mL via INTRAMUSCULAR
  Filled 2019-09-26: qty 0.5

## 2019-09-26 NOTE — Assessment & Plan Note (Addendum)
1.  Normocytic anemia: - Bone marrow biopsy on 10/16/2016 showed slightly hypercellular marrow with trilineage hematopoiesis.  Chromosome analysis was normal. - Colonoscopy on 06/03/2019 showed 4 tubular adenomas and external hemorrhoids.  EGD showed normal proximal and distal esophagus.  Normal stomach.  Duodenitis.  Grade a esophagitis. - Last Feraheme infusion was on 05/26/2019 and 06/02/2019. -He is currently receiving Procrit 20,000 units every 2 weeks and is tolerating it very well. - Labs on 06/20/2019 showed ferritin 315 and percent saturation of 18. -Labs on 09/26/2019 shows hemoglobin 10.8.  His hemoglobin is staying between 10 and 11. -Ferritin is 242 today with percent saturation of 25.  He does not require any iron at this time. -He is a Jehovah witness and cannot take transfusions. -Anemia appears to be secondary to relative iron deficiency and mild degree of CKD.  He also had a history of alcohol abuse. -He will be seen back in 3 months with repeat blood work and iron panel.

## 2019-09-26 NOTE — Patient Instructions (Addendum)
[place] at [facility] Discharge Instructions  You were seen today by Dr. Delton Coombes. He went over your recent lab results. Continue procrit injections. He will see you back in 3 months for labs and follow up.   Thank you for choosing [place] at [facility] to provide your oncology and hematology care.  To afford each patient quality time with our provider, please arrive at least 15 minutes before your scheduled appointment time.   If you have a lab appointment with the [facility] please come in thru the  Main Entrance and check in at the main information desk  You need to re-schedule your appointment should you arrive 10 or more minutes late.  We strive to give you quality time with our providers, and arriving late affects you and other patients whose appointments are after yours.  Also, if you no show three or more times for appointments you may be dismissed from the clinic at the providers discretion.     Again, thank you for choosing [facility].  Our hope is that these requests will decrease the amount of time that you wait before being seen by our physicians.       _____________________________________________________________  Should you have questions after your visit to [facility], please contact our office at [phone] between the hours of 8:00 a.m. and 4:30 p.m.  Voicemails left after 4:00 p.m. will not be returned until the following business day.  For prescription refill requests, have your pharmacy contact our office and allow 72 hours.    Cancer Center Support Programs:   > Cancer Support Group  2nd Tuesday of the month 1pm-2pm, Journey Room

## 2019-09-26 NOTE — Progress Notes (Signed)
[address], [place] [address]   CLINIC:  Medical Oncology/Hematology  PCP:  Curlene Labrum, MD [place] [phone]   REASON FOR VISIT:  Follow-up for normocytic anemia.  CURRENT THERAPY: Procrit every 2 weeks.    INTERVAL HISTORY:  Roger Hansen 73 y.o. male seen for follow-up of normocytic anemia.  Denies any bleeding per rectum or melena.  He is receiving Procrit 20,000 units every 2 weeks and is tolerating it very well.  Leg swellings have been stable.  Numbness in the hands and feet is also stable.  He does report some sleep problems.  Appetite is 50%.  Energy levels are 75%.  Denies any nausea, vomiting, diarrhea or constipation.  Denies any fevers, night sweats or weight loss.    REVIEW OF SYSTEMS:  Review of Systems  Cardiovascular: Positive for leg swelling.  Neurological: Positive for numbness.  Psychiatric/Behavioral: Positive for sleep disturbance.  All other systems reviewed and are negative.    PAST MEDICAL/SURGICAL HISTORY:  Past Medical History:  Diagnosis Date  . Alcohol abuse 11/24/2016  . Anemia 10/11/2013  . Chronic renal disease, stage 3, moderately decreased glomerular filtration rate (GFR) between 30-59 mL/min/1.73 square meter 12/02/2016  . Colon polyps   . GERD (gastroesophageal reflux disease)   . High cholesterol   . Hypertension   . Sinus infection    Past Surgical History:  Procedure Laterality Date  . BIOPSY  06/03/2019   Procedure: BIOPSY;  Surgeon: Rogene Houston, MD;  [facility];  Service: Endoscopy;;  esophagus  . COLONOSCOPY N/A 10/26/2013   Procedure: COLONOSCOPY;  Surgeon: Rogene Houston, MD;  [facility];  Service: Endoscopy;  Laterality: N/A;  325-moved to 1230 Ann to notify pt  . Colonoscopy with polypectomy    . COLONOSCOPY WITH PROPOFOL N/A 06/03/2019   Procedure: COLONOSCOPY WITH PROPOFOL;  Surgeon: Rogene Houston, MD;  [facility];  Service: Endoscopy;  Laterality: N/A;  . ESOPHAGOGASTRODUODENOSCOPY (EGD) WITH PROPOFOL N/A 06/03/2019   Procedure: ESOPHAGOGASTRODUODENOSCOPY (EGD) WITH PROPOFOL;  Surgeon: Rogene Houston, MD;  [facility];  Service: Endoscopy;  Laterality: N/A;  . POLYPECTOMY  06/03/2019   Procedure: POLYPECTOMY;  Surgeon: Rogene Houston, MD;  [facility];  Service: Endoscopy;;  colon     SOCIAL HISTORY:  Social History   Socioeconomic History  . Marital status: Married    Spouse name: Not on file  . Number of children: Not on file  . Years of education: Not on file  . Highest education level: Not on file  Occupational History  . Not on file  Social Needs  . Financial resource strain: Not on file  . Food insecurity    Worry: Not on file    Inability: Not on file  . Transportation needs    Medical: Not on file    Non-medical: Not on file  Tobacco Use  . Smoking status: Former Smoker    Packs/day: 0.50    Years: 20.00    Pack years: 10.00    Quit date: 12/27/1971    Years since quitting: 47.7  . Smokeless tobacco: Never Used  Substance and Sexual Activity  . Alcohol use: Yes    Comment: 1 pint of etoh a day ( not recently)  . Drug use: No  . Sexual activity: Not on file  Lifestyle  . Physical activity    Days per  week: Not on file    Minutes per session: Not on file  . Stress: Not on file  Relationships  . Social Herbalist on phone: Not on file    Gets together: Not on file    Attends religious service: Not on file    Active member of club or organization: Not on file    Attends meetings of clubs or organizations: Not on file    Relationship status: Not on file  . Intimate partner violence    Fear of current or ex partner: Not on file    Emotionally abused: Not on file    Physically abused: Not on file    Forced sexual activity: Not on file  Other Topics Concern  . Not on file  Social History Narrative  . Not on file     FAMILY HISTORY:  Family History  Problem Relation Age of Onset  . Hypertension Mother   . Colon cancer Neg Hx     CURRENT MEDICATIONS:  Outpatient Encounter Medications as of 09/26/2019  Medication Sig Note  . aspirin 81 MG tablet Take 1 tablet (81 mg total) by mouth daily.   Marland Kitchen atorvastatin (LIPITOR) 10 MG tablet Take 10 mg by mouth daily.   Marland Kitchen buPROPion (WELLBUTRIN SR) 150 MG 12 hr tablet Take 150 mg by mouth daily.    . busPIRone (BUSPAR) 30 MG tablet Take 30 mg by mouth 3 (three) times daily.   . cholecalciferol (VITAMIN D) 1000 UNITS tablet Take 1,000 Units by mouth 2 (two) times daily.   . Ferrous Sulfate (IRON) 325 (65 Fe) MG TABS TAKE ONE TABLET BY MOUTH DAILY WITH BREAKFAST. (Patient taking differently: 1 tablet 2 (two) times a day. )   . folic acid (FOLVITE) 1 MG tablet Take 1 mg by mouth daily.   . furosemide (LASIX) 40 MG tablet Take 40 mg by mouth daily.   Marland Kitchen gabapentin (NEURONTIN) 300 MG capsule Take 300 mg by mouth 3 (three) times daily as needed. 10/21/2016: Received from: External Pharmacy  . GNP VITAMIN B-1 100 MG tablet Take 100 mg by mouth daily.    Marland Kitchen losartan (COZAAR) 50 MG tablet Take 50 mg by mouth daily.   Marland Kitchen omeprazole (PRILOSEC) 20 MG capsule Take 20 mg by mouth daily as needed (Acid Reflux).   . potassium chloride SA (K-DUR) 20 MEQ tablet Take 20 mEq by mouth daily.    . traZODone (DESYREL) 100 MG tablet Take 100 mg by mouth at bedtime.   . vitamin B-12 (CYANOCOBALAMIN) 1000 MCG tablet Take 1,000 mcg by mouth daily.   . ziprasidone (GEODON) 40 MG capsule Take 40 mg by mouth 2 (two) times daily with a meal.    . [EXPIRED] influenza vaccine adjuvanted (FLUAD) injection 0.5 mL     No facility-administered encounter medications on file as of 09/26/2019.     ALLERGIES:  Allergies  Allergen Reactions  . Horseradish [Armoracia Rusticana Ext (Horseradish)] Anaphylaxis     PHYSICAL EXAM:  ECOG Performance status: 1  Vitals:   09/26/19 1008  BP: 116/60  Pulse:  68  Resp: 18  Temp: 97.9 F (36.6 C)  SpO2: 91%   Filed Weights   09/26/19 1008  Weight: 187 lb 6.4 oz (85 kg)    Physical Exam Vitals signs reviewed.  Constitutional:      Appearance: Normal appearance.  Cardiovascular:     Rate and Rhythm: Normal rate and regular rhythm.     Heart sounds: Normal heart  sounds.  Pulmonary:     Effort: Pulmonary effort is normal.     Breath sounds: Normal breath sounds.  Abdominal:     General: There is no distension.     Palpations: Abdomen is soft. There is no mass.  Musculoskeletal:        General: Swelling present.  Skin:    General: Skin is warm.  Neurological:     General: No focal deficit present.     Mental Status: He is alert and oriented to person, place, and time.  Psychiatric:        Mood and Affect: Mood normal.        Behavior: Behavior normal.      LABORATORY DATA:  I have reviewed the labs as listed.  CBC    Component Value Date/Time   WBC 4.4 09/26/2019 0928   RBC 3.85 (L) 09/26/2019 0928   HGB 10.8 (L) 09/26/2019 0928   HCT 36.4 (L) 09/26/2019 0928   HCT 27.9 (L) 08/13/2016 0944   PLT 228 09/26/2019 0928   MCV 94.5 09/26/2019 0928   MCH 28.1 09/26/2019 0928   MCHC 29.7 (L) 09/26/2019 0928   RDW 14.7 09/26/2019 0928   LYMPHSABS 1.3 09/26/2019 0928   MONOABS 0.4 09/26/2019 0928   EOSABS 0.2 09/26/2019 0928   BASOSABS 0.0 09/26/2019 0928   CMP Latest Ref Rng & Units 09/26/2019 06/20/2019 05/25/2019  Glucose 70 - 99 mg/dL 94 89 89  BUN 8 - 23 mg/dL 25(H) 18 11  Creatinine 0.61 - 1.24 mg/dL 1.16 0.83 0.94  Sodium 135 - 145 mmol/L 136 137 140  Potassium 3.5 - 5.1 mmol/L 4.5 4.7 4.0  Chloride 98 - 111 mmol/L 104 103 104  CO2 22 - 32 mmol/L _0 Calcium 8.9 - 10.3 mg/dL 9.2 9.5 8.9  Total Protein 6.5 - 8.1 g/dL 7.9 7.2 6.6  Total Bilirubin 0.3 - 1.2 mg/dL 0.6 0.3 0.8  Alkaline Phos 38 - 126 U/L 79 56 53  AST 15 - 41 U/L 14(L) 13(L) 18  ALT 0 - 44 U/L _1 DIAGNOSTIC IMAGING:  I have  independently reviewed the scans and discussed with the patient.    ASSESSMENT & PLAN:   Normocytic anemia 1.  Normocytic anemia: - Bone marrow biopsy on 10/16/2016 showed slightly hypercellular marrow with trilineage hematopoiesis.  Chromosome analysis was normal. - Colonoscopy on 06/03/2019 showed 4 tubular adenomas and external hemorrhoids.  EGD showed normal proximal and distal esophagus.  Normal stomach.  Duodenitis.  Grade a esophagitis. - Last Feraheme infusion was on 05/26/2019 and 06/02/2019. -He is currently receiving Procrit 20,000 units every 2 weeks and is tolerating it very well. - Labs on 06/20/2019 showed ferritin 315 and percent saturation of 18. -Labs on 09/26/2019 shows hemoglobin 10.8.  His hemoglobin is staying between 10 and 11. -Ferritin is 242 today with percent saturation of 25.  He does not require any iron at this time. -He is a Jehovah witness and cannot take transfusions. -Anemia appears to be secondary to relative iron deficiency and mild degree of CKD.  He also had a history of alcohol abuse. -He will be seen back in 3 months with repeat blood work and iron panel.      Orders placed this encounter:  Orders Placed This Encounter  Procedures  . Ferritin  . Iron and TIBC      Derek Jack, MD [place] [phone]

## 2019-09-26 NOTE — Progress Notes (Signed)
Patient tolerated injection with no complaints voiced.  Site clean and dry with no bruising or swelling noted at site.  Band aid applied.  VSS with discharge and left ambulatory with no s/s of distress noted.    

## 2019-10-07 ENCOUNTER — Other Ambulatory Visit: Payer: Self-pay

## 2019-10-10 ENCOUNTER — Inpatient Hospital Stay (HOSPITAL_COMMUNITY): Payer: Medicare Other

## 2019-10-10 ENCOUNTER — Ambulatory Visit (HOSPITAL_COMMUNITY): Payer: Medicare Other

## 2019-10-24 ENCOUNTER — Inpatient Hospital Stay (HOSPITAL_COMMUNITY): Payer: Medicare Other | Attending: Hematology

## 2019-10-24 ENCOUNTER — Ambulatory Visit (HOSPITAL_COMMUNITY): Payer: Medicare Other

## 2019-10-24 DIAGNOSIS — D631 Anemia in chronic kidney disease: Secondary | ICD-10-CM | POA: Insufficient documentation

## 2019-10-24 DIAGNOSIS — N183 Chronic kidney disease, stage 3 unspecified: Secondary | ICD-10-CM | POA: Insufficient documentation

## 2019-10-24 DIAGNOSIS — D649 Anemia, unspecified: Secondary | ICD-10-CM | POA: Insufficient documentation

## 2019-11-07 ENCOUNTER — Inpatient Hospital Stay (HOSPITAL_COMMUNITY): Payer: Medicare Other

## 2019-11-07 ENCOUNTER — Inpatient Hospital Stay (HOSPITAL_COMMUNITY): Payer: Medicare Other | Attending: Hematology

## 2019-11-07 ENCOUNTER — Other Ambulatory Visit: Payer: Self-pay

## 2019-11-07 VITALS — BP 167/78 | HR 76 | Temp 97.5°F | Resp 18

## 2019-11-07 DIAGNOSIS — D631 Anemia in chronic kidney disease: Secondary | ICD-10-CM | POA: Insufficient documentation

## 2019-11-07 DIAGNOSIS — N183 Chronic kidney disease, stage 3 unspecified: Secondary | ICD-10-CM | POA: Insufficient documentation

## 2019-11-07 DIAGNOSIS — D649 Anemia, unspecified: Secondary | ICD-10-CM | POA: Diagnosis not present

## 2019-11-07 LAB — CBC
HCT: 30.4 % — ABNORMAL LOW (ref 39.0–52.0)
Hemoglobin: 9.3 g/dL — ABNORMAL LOW (ref 13.0–17.0)
MCH: 29.2 pg (ref 26.0–34.0)
MCHC: 30.6 g/dL (ref 30.0–36.0)
MCV: 95.3 fL (ref 80.0–100.0)
Platelets: 289 10*3/uL (ref 150–400)
RBC: 3.19 MIL/uL — ABNORMAL LOW (ref 4.22–5.81)
RDW: 15.3 % (ref 11.5–15.5)
WBC: 5 10*3/uL (ref 4.0–10.5)
nRBC: 0 % (ref 0.0–0.2)

## 2019-11-07 MED ORDER — EPOETIN ALFA-EPBX 10000 UNIT/ML IJ SOLN
20000.0000 [IU] | Freq: Once | INTRAMUSCULAR | Status: AC
  Administered 2019-11-07: 20000 [IU] via SUBCUTANEOUS
  Filled 2019-11-07: qty 2

## 2019-11-07 NOTE — Patient Instructions (Signed)
[facility] at [facility]  Discharge Instructions:  Retacrit injection received today. Your hemoglobin was 9.3. _______________________________________________________________  Thank you for choosing [facility] at [facility] to provide your oncology and hematology care.  To afford each patient quality time with our providers, please arrive at least 15 minutes before your scheduled appointment.  You need to re-schedule your appointment if you arrive 10 or more minutes late.  We strive to give you quality time with our providers, and arriving late affects you and other patients whose appointments are after yours.  Also, if you no show three or more times for appointments you may be dismissed from the clinic.  Again, thank you for choosing [facility] at [facility]. Our hope is that these requests will allow you access to exceptional care and in a timely manner. _______________________________________________________________  If you have questions after your visit, please contact our office at [phone] between the hours of 8:30 a.m. and 5:00 p.m. Voicemails left after 4:30 p.m. will not be returned until the following business day. _______________________________________________________________  For prescription refill requests, have your pharmacy contact our office. _______________________________________________________________  Recommendations made by the consultant and any test results will be sent to your referring physician. _______________________________________________________________ 

## 2019-11-07 NOTE — Progress Notes (Signed)
Roger Hansen presents today for Retacrit injection. Hemoglobin reviewed prior to administration. VSS. Injection tolerated without incident or complaint. See MAR for details. Patient discharged in satisfactory condition with follow up instructions.

## 2019-11-08 DIAGNOSIS — F4312 Post-traumatic stress disorder, chronic: Secondary | ICD-10-CM | POA: Diagnosis not present

## 2019-11-08 DIAGNOSIS — F332 Major depressive disorder, recurrent severe without psychotic features: Secondary | ICD-10-CM | POA: Diagnosis not present

## 2019-11-21 ENCOUNTER — Inpatient Hospital Stay (HOSPITAL_COMMUNITY): Payer: Medicare Other

## 2019-11-21 ENCOUNTER — Encounter (HOSPITAL_COMMUNITY): Payer: Self-pay

## 2019-11-21 ENCOUNTER — Inpatient Hospital Stay (HOSPITAL_COMMUNITY): Payer: Medicare Other | Attending: Hematology

## 2019-11-21 ENCOUNTER — Other Ambulatory Visit: Payer: Self-pay

## 2019-11-21 VITALS — BP 156/69 | HR 79 | Temp 97.5°F | Resp 18

## 2019-11-21 DIAGNOSIS — N183 Chronic kidney disease, stage 3 unspecified: Secondary | ICD-10-CM | POA: Diagnosis not present

## 2019-11-21 DIAGNOSIS — D649 Anemia, unspecified: Secondary | ICD-10-CM

## 2019-11-21 DIAGNOSIS — D631 Anemia in chronic kidney disease: Secondary | ICD-10-CM | POA: Diagnosis present

## 2019-11-21 LAB — CBC
HCT: 30.3 % — ABNORMAL LOW (ref 39.0–52.0)
Hemoglobin: 9.3 g/dL — ABNORMAL LOW (ref 13.0–17.0)
MCH: 29.4 pg (ref 26.0–34.0)
MCHC: 30.7 g/dL (ref 30.0–36.0)
MCV: 95.9 fL (ref 80.0–100.0)
Platelets: 214 10*3/uL (ref 150–400)
RBC: 3.16 MIL/uL — ABNORMAL LOW (ref 4.22–5.81)
RDW: 16.7 % — ABNORMAL HIGH (ref 11.5–15.5)
WBC: 5.9 10*3/uL (ref 4.0–10.5)
nRBC: 0 % (ref 0.0–0.2)

## 2019-11-21 MED ORDER — EPOETIN ALFA-EPBX 10000 UNIT/ML IJ SOLN
20000.0000 [IU] | Freq: Once | INTRAMUSCULAR | Status: AC
  Administered 2019-11-21: 20000 [IU] via SUBCUTANEOUS
  Filled 2019-11-21: qty 2

## 2019-11-21 NOTE — Progress Notes (Signed)
Patient tolerated injection with no complaints voiced.  Site clean and dry with no bruising or swelling noted at site.  Band aid applied.  Vss with discharge and left ambulatory with no s/s of distress noted.  

## 2019-12-05 ENCOUNTER — Ambulatory Visit (HOSPITAL_COMMUNITY): Payer: Medicare Other

## 2019-12-05 ENCOUNTER — Inpatient Hospital Stay (HOSPITAL_COMMUNITY): Payer: Medicare Other

## 2019-12-15 ENCOUNTER — Other Ambulatory Visit (HOSPITAL_COMMUNITY): Payer: Self-pay | Admitting: *Deleted

## 2019-12-15 DIAGNOSIS — D500 Iron deficiency anemia secondary to blood loss (chronic): Secondary | ICD-10-CM

## 2019-12-15 DIAGNOSIS — D696 Thrombocytopenia, unspecified: Secondary | ICD-10-CM

## 2019-12-19 ENCOUNTER — Encounter (HOSPITAL_COMMUNITY): Payer: Self-pay | Admitting: Hematology

## 2019-12-19 ENCOUNTER — Inpatient Hospital Stay (HOSPITAL_COMMUNITY): Payer: Medicare Other | Attending: Hematology

## 2019-12-19 ENCOUNTER — Inpatient Hospital Stay (HOSPITAL_BASED_OUTPATIENT_CLINIC_OR_DEPARTMENT_OTHER): Payer: Medicare Other | Admitting: Hematology

## 2019-12-19 ENCOUNTER — Other Ambulatory Visit: Payer: Self-pay

## 2019-12-19 ENCOUNTER — Inpatient Hospital Stay (HOSPITAL_COMMUNITY): Payer: Medicare Other

## 2019-12-19 DIAGNOSIS — D631 Anemia in chronic kidney disease: Secondary | ICD-10-CM | POA: Diagnosis not present

## 2019-12-19 DIAGNOSIS — Z87891 Personal history of nicotine dependence: Secondary | ICD-10-CM | POA: Insufficient documentation

## 2019-12-19 DIAGNOSIS — Z7982 Long term (current) use of aspirin: Secondary | ICD-10-CM | POA: Diagnosis not present

## 2019-12-19 DIAGNOSIS — D649 Anemia, unspecified: Secondary | ICD-10-CM

## 2019-12-19 DIAGNOSIS — F329 Major depressive disorder, single episode, unspecified: Secondary | ICD-10-CM | POA: Diagnosis not present

## 2019-12-19 DIAGNOSIS — N183 Chronic kidney disease, stage 3 unspecified: Secondary | ICD-10-CM | POA: Diagnosis not present

## 2019-12-19 DIAGNOSIS — E611 Iron deficiency: Secondary | ICD-10-CM | POA: Insufficient documentation

## 2019-12-19 DIAGNOSIS — D696 Thrombocytopenia, unspecified: Secondary | ICD-10-CM

## 2019-12-19 DIAGNOSIS — D500 Iron deficiency anemia secondary to blood loss (chronic): Secondary | ICD-10-CM

## 2019-12-19 DIAGNOSIS — Z79899 Other long term (current) drug therapy: Secondary | ICD-10-CM | POA: Insufficient documentation

## 2019-12-19 LAB — IRON AND TIBC
Iron: 54 ug/dL (ref 45–182)
Saturation Ratios: 20 % (ref 17.9–39.5)
TIBC: 266 ug/dL (ref 250–450)
UIBC: 212 ug/dL

## 2019-12-19 LAB — CBC
HCT: 29.4 % — ABNORMAL LOW (ref 39.0–52.0)
Hemoglobin: 9.1 g/dL — ABNORMAL LOW (ref 13.0–17.0)
MCH: 29.5 pg (ref 26.0–34.0)
MCHC: 31 g/dL (ref 30.0–36.0)
MCV: 95.5 fL (ref 80.0–100.0)
Platelets: 203 10*3/uL (ref 150–400)
RBC: 3.08 MIL/uL — ABNORMAL LOW (ref 4.22–5.81)
RDW: 16 % — ABNORMAL HIGH (ref 11.5–15.5)
WBC: 5.4 10*3/uL (ref 4.0–10.5)
nRBC: 0 % (ref 0.0–0.2)

## 2019-12-19 LAB — COMPREHENSIVE METABOLIC PANEL
ALT: 15 U/L (ref 0–44)
AST: 21 U/L (ref 15–41)
Albumin: 3.5 g/dL (ref 3.5–5.0)
Alkaline Phosphatase: 60 U/L (ref 38–126)
Anion gap: 10 (ref 5–15)
BUN: 16 mg/dL (ref 8–23)
CO2: 27 mmol/L (ref 22–32)
Calcium: 8.9 mg/dL (ref 8.9–10.3)
Chloride: 102 mmol/L (ref 98–111)
Creatinine, Ser: 1.35 mg/dL — ABNORMAL HIGH (ref 0.61–1.24)
GFR calc Af Amer: >60 mL/min (ref >60)
GFR calc non Af Amer: 52 mL/min — ABNORMAL LOW (ref >60)
Glucose, Bld: 92 mg/dL (ref 70–99)
Potassium: 4.3 mmol/L (ref 3.5–5.1)
Sodium: 139 mmol/L (ref 135–145)
Total Bilirubin: 0.6 mg/dL (ref 0.3–1.2)
Total Protein: 6.8 g/dL (ref 6.5–8.1)

## 2019-12-19 LAB — VITAMIN B12: Vitamin B-12: 955 pg/mL — ABNORMAL HIGH (ref 180–914)

## 2019-12-19 LAB — FERRITIN: Ferritin: 361 ng/mL — ABNORMAL HIGH (ref 24–336)

## 2019-12-19 MED ORDER — EPOETIN ALFA-EPBX 10000 UNIT/ML IJ SOLN
20000.0000 [IU] | Freq: Once | INTRAMUSCULAR | Status: AC
  Administered 2019-12-19: 20000 [IU] via SUBCUTANEOUS

## 2019-12-19 MED ORDER — EPOETIN ALFA-EPBX 10000 UNIT/ML IJ SOLN
INTRAMUSCULAR | Status: AC
  Filled 2019-12-19: qty 2

## 2019-12-19 NOTE — Patient Instructions (Addendum)
[facility] at [facility] Discharge Instructions  You were seen today by Dr. Delton Coombes. He went over your recent lab results. Continue getting the Retacrit injections every 2 weeks. He would like you to follow up with Dr. Langston Masker for your depression. He will see you back in 4 weeks for labs, injection and follow up.   Thank you for choosing [facility] at [facility] to provide your oncology and hematology care.  To afford each patient quality time with our provider, please arrive at least 15 minutes before your scheduled appointment time.   If you have a lab appointment with the [facility] please come in thru the  Main Entrance and check in at the main information desk  You need to re-schedule your appointment should you arrive 10 or more minutes late.  We strive to give you quality time with our providers, and arriving late affects you and other patients whose appointments are after yours.  Also, if you no show three or more times for appointments you may be dismissed from the clinic at the providers discretion.     Again, thank you for choosing [facility].  Our hope is that these requests will decrease the amount of time that you wait before being seen by our physicians.       _____________________________________________________________  Should you have questions after your visit to [facility], please contact our office at [phone] between the hours of 8:00 a.m. and 4:30 p.m.  Voicemails left after 4:00 p.m. will not be returned until the following business day.  For prescription refill requests, have your pharmacy contact our office and allow 72 hours.    Cancer Center Support Programs:   > Cancer Support Group  2nd Tuesday of the month 1pm-2pm, Journey Room

## 2019-12-19 NOTE — Progress Notes (Signed)
Roger Hansen, [name] [address]   CLINIC:  Medical Oncology/Hematology  PCP:  Curlene Labrum, MD [name] [address] [phone]   REASON FOR VISIT:  Follow-up for normocytic anemia.  CURRENT THERAPY: Procrit every 2 weeks.    INTERVAL HISTORY:  Roger Hansen 74 y.o. male seen for follow-up of normocytic anemia.  Denies any bleeding per rectum or melena.  Denies any headaches or vision changes.  No chest pains reported.  Occasional dizziness is stable.  Numbness in the hands and feet is also stable.   REVIEW OF SYSTEMS:  Review of Systems  Cardiovascular: Positive for leg swelling.  Neurological: Positive for dizziness and numbness.  Psychiatric/Behavioral: Positive for depression and sleep disturbance.  All other systems reviewed and are negative.    PAST MEDICAL/SURGICAL HISTORY:  Past Medical History:  Diagnosis Date  . Alcohol abuse 11/24/2016  . Anemia 10/11/2013  . Chronic renal disease, stage 3, moderately decreased glomerular filtration rate (GFR) between 30-59 mL/min/1.73 square meter 12/02/2016  . Colon polyps   . GERD (gastroesophageal reflux disease)   . High cholesterol   . Hypertension   . Sinus infection    Past Surgical History:  Procedure Laterality Date  . BIOPSY  06/03/2019   Procedure: BIOPSY;  Surgeon: Rogene Houston, MD;  [facility];  Service: Endoscopy;;  esophagus  . COLONOSCOPY N/A 10/26/2013   Procedure: COLONOSCOPY;  Surgeon: Rogene Houston, MD;  [facility];  Service: Endoscopy;  Laterality: N/A;  325-moved to 1230 Ann to notify pt  . Colonoscopy with polypectomy    . COLONOSCOPY WITH PROPOFOL N/A 06/03/2019   Procedure: COLONOSCOPY WITH PROPOFOL;  Surgeon: Rogene Houston, MD;  [facility];  Service: Endoscopy;  Laterality: N/A;  . ESOPHAGOGASTRODUODENOSCOPY (EGD) WITH PROPOFOL N/A 06/03/2019   Procedure: ESOPHAGOGASTRODUODENOSCOPY (EGD) WITH  PROPOFOL;  Surgeon: Rogene Houston, MD;  [facility];  Service: Endoscopy;  Laterality: N/A;  . POLYPECTOMY  06/03/2019   Procedure: POLYPECTOMY;  Surgeon: Rogene Houston, MD;  [facility];  Service: Endoscopy;;  colon     SOCIAL HISTORY:  Social History   Socioeconomic History  . Marital status: Married    Spouse name: Not on file  . Number of children: Not on file  . Years of education: Not on file  . Highest education level: Not on file  Occupational History  . Not on file  Tobacco Use  . Smoking status: Former Smoker    Packs/day: 0.50    Years: 20.00    Pack years: 10.00    Quit date: 12/27/1971    Years since quitting: 48.0  . Smokeless tobacco: Never Used  Substance and Sexual Activity  . Alcohol use: Yes    Comment: 1 pint of etoh a day ( not recently)  . Drug use: No  . Sexual activity: Not on file  Other Topics Concern  . Not on file  Social History Narrative  . Not on file   Social Determinants of Health   Financial Resource Strain:   . Difficulty of Paying Living Expenses: Not on file  Food Insecurity:   . Worried About Charity fundraiser in the Last Year: Not on file  . Ran Out of Food in the Last Year: Not on file  Transportation Needs:   . Lack of Transportation (Medical): Not on file  . Lack of Transportation (Non-Medical): Not on  file  Physical Activity:   . Days of Exercise per Week: Not on file  . Minutes of Exercise per Session: Not on file  Stress:   . Feeling of Stress : Not on file  Social Connections:   . Frequency of Communication with Friends and Family: Not on file  . Frequency of Social Gatherings with Friends and Family: Not on file  . Attends Religious Services: Not on file  . Active Member of Clubs or Organizations: Not on file  . Attends Archivist Meetings: Not on file  . Marital Status: Not on file  Intimate Partner Violence:   . Fear of Current or Ex-Partner: Not on file  . Emotionally  Abused: Not on file  . Physically Abused: Not on file  . Sexually Abused: Not on file    FAMILY HISTORY:  Family History  Problem Relation Age of Onset  . Hypertension Mother   . Colon cancer Neg Hx     CURRENT MEDICATIONS:  Outpatient Encounter Medications as of 12/19/2019  Medication Sig Note  . aspirin 81 MG tablet Take 1 tablet (81 mg total) by mouth daily.   Marland Kitchen atorvastatin (LIPITOR) 10 MG tablet Take 10 mg by mouth daily.   Marland Kitchen buPROPion (WELLBUTRIN SR) 150 MG 12 hr tablet Take 150 mg by mouth daily.    . busPIRone (BUSPAR) 30 MG tablet Take 30 mg by mouth 3 (three) times daily.   . cholecalciferol (VITAMIN D) 1000 UNITS tablet Take 1,000 Units by mouth 2 (two) times daily.   . Ferrous Sulfate (IRON) 325 (65 Fe) MG TABS TAKE ONE TABLET BY MOUTH DAILY WITH BREAKFAST. (Patient taking differently: 1 tablet 2 (two) times a day. )   . folic acid (FOLVITE) 1 MG tablet Take 1 mg by mouth daily.   . furosemide (LASIX) 20 MG tablet    . GNP VITAMIN B-1 100 MG tablet Take 100 mg by mouth daily.    Marland Kitchen losartan (COZAAR) 50 MG tablet Take 50 mg by mouth daily.   Marland Kitchen omeprazole (PRILOSEC) 20 MG capsule Take 20 mg by mouth daily as needed (Acid Reflux).   . potassium chloride SA (K-DUR) 20 MEQ tablet Take 20 mEq by mouth daily.    . traZODone (DESYREL) 100 MG tablet Take 100 mg by mouth at bedtime.   . vitamin B-12 (CYANOCOBALAMIN) 1000 MCG tablet Take 1,000 mcg by mouth daily.   . ziprasidone (GEODON) 40 MG capsule Take 40 mg by mouth 2 (two) times daily with a meal.    . gabapentin (NEURONTIN) 300 MG capsule Take 300 mg by mouth 3 (three) times daily as needed. 10/21/2016: Received from: External Pharmacy  . [DISCONTINUED] buPROPion (WELLBUTRIN XL) 150 MG 24 hr tablet    . [DISCONTINUED] furosemide (LASIX) 40 MG tablet Take 40 mg by mouth daily.    No facility-administered encounter medications on file as of 12/19/2019.    ALLERGIES:  Allergies  Allergen Reactions  . Horseradish [Armoracia  Rusticana Ext (Horseradish)] Anaphylaxis     PHYSICAL EXAM:  ECOG Performance status: 1  Vitals:   12/19/19 1042  BP: 126/62  Pulse: 72  Resp: 18  Temp: (!) 97.1 F (36.2 C)  SpO2: 100%   Filed Weights   12/19/19 1042  Weight: 183 lb 9.6 oz (83.3 kg)    Physical Exam Vitals reviewed.  Constitutional:      Appearance: Normal appearance.  Cardiovascular:     Rate and Rhythm: Normal rate and regular rhythm.  Heart sounds: Normal heart sounds.  Pulmonary:     Effort: Pulmonary effort is normal.     Breath sounds: Normal breath sounds.  Abdominal:     General: There is no distension.     Palpations: Abdomen is soft. There is no mass.  Musculoskeletal:        General: Swelling present.  Skin:    General: Skin is warm.  Neurological:     General: No focal deficit present.     Mental Status: He is alert and oriented to person, place, and time.  Psychiatric:        Mood and Affect: Mood normal.        Behavior: Behavior normal.      LABORATORY DATA:  I have reviewed the labs as listed.  CBC    Component Value Date/Time   WBC 5.4 12/19/2019 1021   RBC 3.08 (L) 12/19/2019 1021   HGB 9.1 (L) 12/19/2019 1021   HCT 29.4 (L) 12/19/2019 1021   HCT 27.9 (L) 08/13/2016 0944   PLT 203 12/19/2019 1021   MCV 95.5 12/19/2019 1021   MCH 29.5 12/19/2019 1021   MCHC 31.0 12/19/2019 1021   RDW 16.0 (H) 12/19/2019 1021   LYMPHSABS 1.3 09/26/2019 0928   MONOABS 0.4 09/26/2019 0928   EOSABS 0.2 09/26/2019 0928   BASOSABS 0.0 09/26/2019 0928   CMP Latest Ref Rng & Units 12/19/2019 09/26/2019 06/20/2019  Glucose 70 - 99 mg/dL 92 94 89  BUN 8 - 23 mg/dL 16 25(H) 18  Creatinine 0.61 - 1.24 mg/dL 1.35(H) 1.16 0.83  Sodium 135 - 145 mmol/L 139 136 137  Potassium 3.5 - 5.1 mmol/L 4.3 4.5 4.7  Chloride 98 - 111 mmol/L 102 104 103  CO2 22 - 32 mmol/L _0 Calcium 8.9 - 10.3 mg/dL 8.9 9.2 9.5  Total Protein 6.5 - 8.1 g/dL 6.8 7.9 7.2  Total Bilirubin 0.3 - 1.2 mg/dL 0.6 0.6  0.3  Alkaline Phos 38 - 126 U/L 60 79 56  AST 15 - 41 U/L 21 14(L) 13(L)  ALT 0 - 44 U/L _1 DIAGNOSTIC IMAGING:  I have independently reviewed the scans and discussed with the patient.    ASSESSMENT & PLAN:   Normocytic anemia 1.  Normocytic anemia in a Jehovah witness: - Bone marrow biopsy on 10/16/2016 showed slightly hypercellular marrow with trilineage hematopoiesis.  Chromosome analysis was normal. - Colonoscopy on 06/03/2019 showed 4 tubular adenomas and external hemorrhoids.  EGD showed normal proximal and distal esophagus.  Normal stomach.  Duodenitis.  Grade a esophagitis. -Anemia from combination of CKD and iron deficiency. - Last Feraheme infusion was on 05/26/2019 and 06/02/2019. -He is on Retacrit 20,000 units every 2 weeks. -He has missed injections in October and December. -We reviewed his labs.  His hemoglobin is 9.1. -His percent saturation is 20 ferritin of 361.  I have recommended Feraheme infusion x2. -B12 is within normal limits.  He was told to not to miss appointments. -He will be reevaluated in 4 weeks.  2.  Depression: -He reportedly had PTSD since he was in [place].  He occasionally gets suicidal thoughts but does not have a plan. -He follows up with Dr. Langston Masker in [facility]. -We will reach out to his office and let the patient be seen sooner to adjust his medications.  Total time spent is 30 minutes with more than 70% of the time spent face-to-face.    Orders  placed this encounter:  No orders of the defined types were placed in this encounter.     Derek Jack, MD [place] [phone]

## 2019-12-19 NOTE — Assessment & Plan Note (Addendum)
1.  Normocytic anemia in a Jehovah witness: - Bone marrow biopsy on 10/16/2016 showed slightly hypercellular marrow with trilineage hematopoiesis.  Chromosome analysis was normal. - Colonoscopy on 06/03/2019 showed 4 tubular adenomas and external hemorrhoids.  EGD showed normal proximal and distal esophagus.  Normal stomach.  Duodenitis.  Grade a esophagitis. -Anemia from combination of CKD and iron deficiency. - Last Feraheme infusion was on 05/26/2019 and 06/02/2019. -He is on Retacrit 20,000 units every 2 weeks. -He has missed injections in October and December. -We reviewed his labs.  His hemoglobin is 9.1. -His percent saturation is 20 ferritin of 361.  I have recommended Feraheme infusion x2. -B12 is within normal limits.  He was told to not to miss appointments. -He will be reevaluated in 4 weeks.  2.  Depression: -He reportedly had PTSD since he was in [place].  He occasionally gets suicidal thoughts but does not have a plan. -He follows up with Dr. Langston Masker in [facility]. -We will reach out to his office and let the patient be seen sooner to adjust his medications.

## 2019-12-20 ENCOUNTER — Encounter (HOSPITAL_COMMUNITY): Payer: Self-pay | Admitting: General Practice

## 2019-12-20 NOTE — Progress Notes (Signed)
[facility] CSW Progress Notes  Spoke w patient by phone, explored his symptoms of depression.  Symptoms of depression have increased since COVID shutdown.  Feels he is getting "a lot of support" from his family.  Reports that his depression was "worse yesterday when I was up there because I was all alone." Denies current suicidal ideation or plan - states that his mood has been impacted by isolation due to COVID precautions.  Also daughter died in 12-Jun-2019 - struggled with the first Christmas without her being present.  Relies on wife, mother, children and grandchildren to lift his mood.  Admits that he can struggle with depressive thoughts, but "then I get around my grandchildren and it lifts my mood."  Relates current increased struggle with depression to social isolation and interruption of normal routines/activities.  Completed PHQ 9, scored 10, rated his symptoms as "somewhat difficult", denied current suicidal thoughts.  Admits to sleep disturbances ("sometimes I just cannot sleep" but has sleeping medications available which help somewhat).  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  Has been diagnosed with PTSD post [place] War.    Discussed safety - patient admits that he does have access to weapons - states he will ask his family to remove them.  Strengths/supports include family, faith community, awareness of negative impact of loss of family member on others and desire to care for his mother/wife/grandchildren/children.  Also has current psychiatrist (Dr Natasha Mead in [place] who has treated him for PTSD for a long time).  Does not know time/date of his next appointment but will ask his wife to schedule an appointment if he will not be seen soon.  Declines referral for counselor but "I will think about it."  Prefers to rely on family for support.  Patient aware that he can ask oncologist to contact CSW if further support is  needed.  Edwyna Shell, LCSW Clinical Social Worker Phone:  [phone] Cell:  [phone]

## 2019-12-23 ENCOUNTER — Inpatient Hospital Stay (HOSPITAL_COMMUNITY): Payer: Medicare Other

## 2019-12-23 ENCOUNTER — Encounter (HOSPITAL_COMMUNITY): Payer: Self-pay

## 2019-12-23 ENCOUNTER — Other Ambulatory Visit: Payer: Self-pay

## 2019-12-23 VITALS — BP 137/85 | HR 77 | Temp 97.1°F | Resp 16

## 2019-12-23 DIAGNOSIS — F329 Major depressive disorder, single episode, unspecified: Secondary | ICD-10-CM | POA: Diagnosis not present

## 2019-12-23 DIAGNOSIS — D631 Anemia in chronic kidney disease: Secondary | ICD-10-CM | POA: Diagnosis not present

## 2019-12-23 DIAGNOSIS — E611 Iron deficiency: Secondary | ICD-10-CM | POA: Diagnosis not present

## 2019-12-23 DIAGNOSIS — N183 Chronic kidney disease, stage 3 unspecified: Secondary | ICD-10-CM

## 2019-12-23 DIAGNOSIS — Z7982 Long term (current) use of aspirin: Secondary | ICD-10-CM | POA: Diagnosis not present

## 2019-12-23 DIAGNOSIS — D649 Anemia, unspecified: Secondary | ICD-10-CM

## 2019-12-23 DIAGNOSIS — Z87891 Personal history of nicotine dependence: Secondary | ICD-10-CM | POA: Diagnosis not present

## 2019-12-23 MED ORDER — SODIUM CHLORIDE 0.9 % IV SOLN: Freq: Once | INTRAVENOUS | Status: AC

## 2019-12-23 MED ORDER — SODIUM CHLORIDE 0.9 % IV SOLN
510.0000 mg | Freq: Once | INTRAVENOUS | Status: AC
  Administered 2019-12-23: 510 mg via INTRAVENOUS
  Filled 2019-12-23: qty 510

## 2019-12-23 NOTE — Progress Notes (Signed)
Iron given per orders. Patient tolerated it well without problems. Vitals stable and discharged home from clinic ambulatory. Follow up as scheduled.  

## 2019-12-30 ENCOUNTER — Ambulatory Visit (HOSPITAL_COMMUNITY): Payer: Medicare Other

## 2020-01-02 ENCOUNTER — Other Ambulatory Visit: Payer: Self-pay

## 2020-01-02 ENCOUNTER — Inpatient Hospital Stay (HOSPITAL_COMMUNITY): Payer: Medicare Other

## 2020-01-02 ENCOUNTER — Encounter (HOSPITAL_COMMUNITY): Payer: Self-pay

## 2020-01-02 VITALS — BP 136/68 | HR 69 | Temp 97.3°F | Resp 18

## 2020-01-02 DIAGNOSIS — N183 Chronic kidney disease, stage 3 unspecified: Secondary | ICD-10-CM

## 2020-01-02 DIAGNOSIS — F329 Major depressive disorder, single episode, unspecified: Secondary | ICD-10-CM | POA: Diagnosis not present

## 2020-01-02 DIAGNOSIS — E611 Iron deficiency: Secondary | ICD-10-CM | POA: Diagnosis not present

## 2020-01-02 DIAGNOSIS — D649 Anemia, unspecified: Secondary | ICD-10-CM

## 2020-01-02 DIAGNOSIS — D631 Anemia in chronic kidney disease: Secondary | ICD-10-CM | POA: Diagnosis not present

## 2020-01-02 DIAGNOSIS — Z7982 Long term (current) use of aspirin: Secondary | ICD-10-CM | POA: Diagnosis not present

## 2020-01-02 DIAGNOSIS — Z87891 Personal history of nicotine dependence: Secondary | ICD-10-CM | POA: Diagnosis not present

## 2020-01-02 LAB — CBC
HCT: 34.4 % — ABNORMAL LOW (ref 39.0–52.0)
Hemoglobin: 10.4 g/dL — ABNORMAL LOW (ref 13.0–17.0)
MCH: 29.5 pg (ref 26.0–34.0)
MCHC: 30.2 g/dL (ref 30.0–36.0)
MCV: 97.7 fL (ref 80.0–100.0)
Platelets: 313 10*3/uL (ref 150–400)
RBC: 3.52 MIL/uL — ABNORMAL LOW (ref 4.22–5.81)
RDW: 15.4 % (ref 11.5–15.5)
WBC: 4.5 10*3/uL (ref 4.0–10.5)
nRBC: 0 % (ref 0.0–0.2)

## 2020-01-02 MED ORDER — EPOETIN ALFA-EPBX 10000 UNIT/ML IJ SOLN
20000.0000 [IU] | Freq: Once | INTRAMUSCULAR | Status: AC
  Administered 2020-01-02: 20000 [IU] via SUBCUTANEOUS
  Filled 2020-01-02: qty 2

## 2020-01-02 NOTE — Progress Notes (Signed)
Roger Hansen tolerated Retacrit injection well without complaints or incident. VSS Hgb 10.4 today. Pt discharged self ambulatory in satisfactory condition

## 2020-01-02 NOTE — Patient Instructions (Signed)
[facility] at [facility] Discharge Instructions  Received Retacrit injection today. Follow-up as scheduled. Call clinic for any questions or concerns   Thank you for choosing [facility] at [facility] to provide your oncology and hematology care.  To afford each patient quality time with our provider, please arrive at least 15 minutes before your scheduled appointment time.   If you have a lab appointment with the Cancer Center please come in thru the Main Entrance and check in at the main information desk.  You need to re-schedule your appointment should you arrive 10 or more minutes late.  We strive to give you quality time with our providers, and arriving late affects you and other patients whose appointments are after yours.  Also, if you no show three or more times for appointments you may be dismissed from the clinic at the providers discretion.     Again, thank you for choosing  Cancer Center.  Our hope is that these requests will decrease the amount of time that you wait before being seen by our physicians.       _____________________________________________________________  Should you have questions after your visit to  Cancer Center, please contact our office at [phone] between the hours of 8:00 a.m. and 4:30 p.m.  Voicemails left after 4:00 p.m. will not be returned until the following business day.  For prescription refill requests, have your pharmacy contact our office and allow 72 hours.    Due to Covid, you will need to wear a mask upon entering the hospital. If you do not have a mask, a mask will be given to you at the Main Entrance upon arrival. For doctor visits, patients may have 1 support person with them. For treatment visits, patients can not have anyone with them due to social distancing guidelines and our immunocompromised population.     

## 2020-01-16 ENCOUNTER — Ambulatory Visit (HOSPITAL_COMMUNITY): Payer: Medicare Other | Admitting: Hematology

## 2020-01-16 ENCOUNTER — Ambulatory Visit (HOSPITAL_COMMUNITY): Payer: Medicare Other

## 2020-01-16 ENCOUNTER — Inpatient Hospital Stay (HOSPITAL_COMMUNITY): Payer: Medicare Other | Attending: Hematology

## 2020-02-23 ENCOUNTER — Other Ambulatory Visit (HOSPITAL_COMMUNITY): Payer: Self-pay | Admitting: *Deleted

## 2020-02-23 DIAGNOSIS — D696 Thrombocytopenia, unspecified: Secondary | ICD-10-CM

## 2020-02-23 DIAGNOSIS — D649 Anemia, unspecified: Secondary | ICD-10-CM

## 2020-02-23 DIAGNOSIS — N183 Chronic kidney disease, stage 3 unspecified: Secondary | ICD-10-CM

## 2020-02-24 ENCOUNTER — Other Ambulatory Visit: Payer: Self-pay

## 2020-02-24 ENCOUNTER — Inpatient Hospital Stay (HOSPITAL_BASED_OUTPATIENT_CLINIC_OR_DEPARTMENT_OTHER): Payer: Medicare Other | Admitting: Nurse Practitioner

## 2020-02-24 ENCOUNTER — Inpatient Hospital Stay (HOSPITAL_COMMUNITY): Payer: Medicare Other

## 2020-02-24 ENCOUNTER — Inpatient Hospital Stay (HOSPITAL_COMMUNITY): Payer: Medicare Other | Attending: Nurse Practitioner

## 2020-02-24 DIAGNOSIS — Z87891 Personal history of nicotine dependence: Secondary | ICD-10-CM | POA: Diagnosis not present

## 2020-02-24 DIAGNOSIS — N183 Chronic kidney disease, stage 3 unspecified: Secondary | ICD-10-CM | POA: Diagnosis not present

## 2020-02-24 DIAGNOSIS — D649 Anemia, unspecified: Secondary | ICD-10-CM | POA: Diagnosis not present

## 2020-02-24 DIAGNOSIS — D631 Anemia in chronic kidney disease: Secondary | ICD-10-CM | POA: Diagnosis not present

## 2020-02-24 DIAGNOSIS — F329 Major depressive disorder, single episode, unspecified: Secondary | ICD-10-CM | POA: Insufficient documentation

## 2020-02-24 DIAGNOSIS — Z79899 Other long term (current) drug therapy: Secondary | ICD-10-CM | POA: Insufficient documentation

## 2020-02-24 DIAGNOSIS — Z7982 Long term (current) use of aspirin: Secondary | ICD-10-CM | POA: Insufficient documentation

## 2020-02-24 DIAGNOSIS — D696 Thrombocytopenia, unspecified: Secondary | ICD-10-CM

## 2020-02-24 LAB — FERRITIN: Ferritin: 421 ng/mL — ABNORMAL HIGH (ref 24–336)

## 2020-02-24 LAB — COMPREHENSIVE METABOLIC PANEL
ALT: 9 U/L (ref 0–44)
AST: 13 U/L — ABNORMAL LOW (ref 15–41)
Albumin: 3.6 g/dL (ref 3.5–5.0)
Alkaline Phosphatase: 54 U/L (ref 38–126)
Anion gap: 6 (ref 5–15)
BUN: 14 mg/dL (ref 8–23)
CO2: 24 mmol/L (ref 22–32)
Calcium: 9 mg/dL (ref 8.9–10.3)
Chloride: 109 mmol/L (ref 98–111)
Creatinine, Ser: 1.22 mg/dL (ref 0.61–1.24)
GFR calc Af Amer: >60 mL/min (ref >60)
GFR calc non Af Amer: 59 mL/min — ABNORMAL LOW (ref >60)
Glucose, Bld: 83 mg/dL (ref 70–99)
Potassium: 4.4 mmol/L (ref 3.5–5.1)
Sodium: 139 mmol/L (ref 135–145)
Total Bilirubin: 0.6 mg/dL (ref 0.3–1.2)
Total Protein: 6.8 g/dL (ref 6.5–8.1)

## 2020-02-24 LAB — IRON AND TIBC
Iron: 37 ug/dL — ABNORMAL LOW (ref 45–182)
Saturation Ratios: 16 % — ABNORMAL LOW (ref 17.9–39.5)
TIBC: 236 ug/dL — ABNORMAL LOW (ref 250–450)
UIBC: 199 ug/dL

## 2020-02-24 LAB — CBC WITH DIFFERENTIAL/PLATELET
Abs Immature Granulocytes: 0.02 10*3/uL (ref 0.00–0.07)
Basophils Absolute: 0 10*3/uL (ref 0.0–0.1)
Basophils Relative: 1 %
Eosinophils Absolute: 0.2 10*3/uL (ref 0.0–0.5)
Eosinophils Relative: 4 %
HCT: 27.4 % — ABNORMAL LOW (ref 39.0–52.0)
Hemoglobin: 8 g/dL — ABNORMAL LOW (ref 13.0–17.0)
Immature Granulocytes: 0 %
Lymphocytes Relative: 27 %
Lymphs Abs: 1.4 10*3/uL (ref 0.7–4.0)
MCH: 28.2 pg (ref 26.0–34.0)
MCHC: 29.2 g/dL — ABNORMAL LOW (ref 30.0–36.0)
MCV: 96.5 fL (ref 80.0–100.0)
Monocytes Absolute: 0.4 10*3/uL (ref 0.1–1.0)
Monocytes Relative: 9 %
Neutro Abs: 2.9 10*3/uL (ref 1.7–7.7)
Neutrophils Relative %: 59 %
Platelets: 247 10*3/uL (ref 150–400)
RBC: 2.84 MIL/uL — ABNORMAL LOW (ref 4.22–5.81)
RDW: 16.2 % — ABNORMAL HIGH (ref 11.5–15.5)
WBC: 5 10*3/uL (ref 4.0–10.5)
nRBC: 0 % (ref 0.0–0.2)

## 2020-02-24 LAB — VITAMIN B12: Vitamin B-12: 665 pg/mL (ref 180–914)

## 2020-02-24 MED ORDER — EPOETIN ALFA-EPBX 10000 UNIT/ML IJ SOLN
INTRAMUSCULAR | Status: AC
  Filled 2020-02-24: qty 1

## 2020-02-24 MED ORDER — EPOETIN ALFA-EPBX 10000 UNIT/ML IJ SOLN
20000.0000 [IU] | Freq: Once | INTRAMUSCULAR | Status: AC
  Administered 2020-02-24: 20000 [IU] via SUBCUTANEOUS

## 2020-02-24 NOTE — Progress Notes (Signed)
[address] [place], [place] [address]   CLINIC:  Medical Oncology/Hematology  PCP:  Curlene Labrum, MD [place] [id number] [phone]   REASON FOR VISIT: Follow-up for normocytic anemia  CURRENT THERAPY: Procrit injections   INTERVAL HISTORY:  Roger Hansen 74 y.o. male returns for routine follow-up for normocytic anemia.  Patient reports he has felt tired and fatigued over the past couple weeks.  He reports he can feel that his blood counts are lower.  He does report he missed a few appointments for injections.  He reports he has a lot of stress on him by raising his grandkids.  He denies any bright red bleeding per rectum or melena.  He denies any easy bruising or bleeding. Denies any nausea, vomiting, or diarrhea. Denies any new pains. Had not noticed any recent bleeding such as epistaxis, hematuria or hematochezia. Denies recent chest pain on exertion, shortness of breath on minimal exertion, pre-syncopal episodes, or palpitations. Denies any numbness or tingling in hands or feet. Denies any recent fevers, infections, or recent hospitalizations. Patient reports appetite at 50% and energy level at 50%.  He has maintain his weight at this time.     REVIEW OF SYSTEMS:  Review of Systems  Constitutional: Positive for chills and fatigue.  Cardiovascular: Positive for leg swelling.  Neurological: Positive for dizziness, headaches and numbness.  All other systems reviewed and are negative.    PAST MEDICAL/SURGICAL HISTORY:  Past Medical History:  Diagnosis Date  . Alcohol abuse 11/24/2016  . Anemia 10/11/2013  . Chronic renal disease, stage 3, moderately decreased glomerular filtration rate (GFR) between 30-59 mL/min/1.73 square meter 12/02/2016  . Colon polyps   . GERD (gastroesophageal reflux disease)   . High cholesterol   . Hypertension   . Sinus infection    Past Surgical History:  Procedure Laterality Date  . BIOPSY   06/03/2019   Procedure: BIOPSY;  Surgeon: Rogene Houston, MD;  [facility];  Service: Endoscopy;;  esophagus  . COLONOSCOPY N/A 10/26/2013   Procedure: COLONOSCOPY;  Surgeon: Rogene Houston, MD;  [facility]: [facility];  Service: Endoscopy;  Laterality: N/A;  325-moved to 1230 Ann to notify pt  . Colonoscopy with polypectomy    . COLONOSCOPY WITH PROPOFOL N/A 06/03/2019   Procedure: COLONOSCOPY WITH PROPOFOL;  Surgeon: Rogene Houston, MD;  [facility];  Service: Endoscopy;  Laterality: N/A;  . ESOPHAGOGASTRODUODENOSCOPY (EGD) WITH PROPOFOL N/A 06/03/2019   Procedure: ESOPHAGOGASTRODUODENOSCOPY (EGD) WITH PROPOFOL;  Surgeon: Rogene Houston, MD;  [facility];  Service: Endoscopy;  Laterality: N/A;  . POLYPECTOMY  06/03/2019   Procedure: POLYPECTOMY;  Surgeon: Rogene Houston, MD;  [facility];  Service: Endoscopy;;  colon     SOCIAL HISTORY:  Social History   Socioeconomic History  . Marital status: Married    Spouse name: Not on file  . Number of children: Not on file  . Years of education: Not on file  . Highest education level: Not on file  Occupational History  . Not on file  Tobacco Use  . Smoking status: Former Smoker    Packs/day: 0.50    Years: 20.00    Pack years: 10.00    Quit date: 12/27/1971    Years since quitting: 48.1  . Smokeless tobacco: Never Used  Substance and Sexual Activity  . Alcohol use: Yes    Comment: 1 pint of etoh a  day ( not recently)  . Drug use: No  . Sexual activity: Not on file  Other Topics Concern  . Not on file  Social History Narrative  . Not on file   Social Determinants of Health   Financial Resource Strain:   . Difficulty of Paying Living Expenses:   Food Insecurity:   . Worried About Charity fundraiser in the Last Year:   . Arboriculturist in the Last Year:   Transportation Needs:   . Film/video editor (Medical):   Marland Kitchen Lack of Transportation (Non-Medical):   Physical  Activity:   . Days of Exercise per Week:   . Minutes of Exercise per Session:   Stress:   . Feeling of Stress :   Social Connections:   . Frequency of Communication with Friends and Family:   . Frequency of Social Gatherings with Friends and Family:   . Attends Religious Services:   . Active Member of Clubs or Organizations:   . Attends Archivist Meetings:   Marland Kitchen Marital Status:   Intimate Partner Violence:   . Fear of Current or Ex-Partner:   . Emotionally Abused:   Marland Kitchen Physically Abused:   . Sexually Abused:     FAMILY HISTORY:  Family History  Problem Relation Age of Onset  . Hypertension Mother   . Colon cancer Neg Hx     CURRENT MEDICATIONS:  Outpatient Encounter Medications as of 02/24/2020  Medication Sig Note  . aspirin 81 MG tablet Take 1 tablet (81 mg total) by mouth daily.   Marland Kitchen atorvastatin (LIPITOR) 10 MG tablet Take 10 mg by mouth daily.   Marland Kitchen buPROPion (WELLBUTRIN SR) 150 MG 12 hr tablet Take 150 mg by mouth daily.    . busPIRone (BUSPAR) 30 MG tablet Take 30 mg by mouth 3 (three) times daily.   . cholecalciferol (VITAMIN D) 1000 UNITS tablet Take 1,000 Units by mouth 2 (two) times daily.   . Eszopiclone 3 MG TABS Take 3 mg by mouth at bedtime.    . Ferrous Sulfate (IRON) 325 (65 Fe) MG TABS TAKE ONE TABLET BY MOUTH DAILY WITH BREAKFAST. (Patient taking differently: 1 tablet 2 (two) times a day. )   . folic acid (FOLVITE) 1 MG tablet Take 1 mg by mouth daily.   . furosemide (LASIX) 20 MG tablet Take 20 mg by mouth daily.    Marland Kitchen GNP VITAMIN B-1 100 MG tablet Take 100 mg by mouth daily.    Marland Kitchen losartan (COZAAR) 50 MG tablet Take 50 mg by mouth daily.   . potassium chloride SA (K-DUR) 20 MEQ tablet Take 20 mEq by mouth daily.    . traZODone (DESYREL) 100 MG tablet Take 100 mg by mouth at bedtime.   . vitamin B-12 (CYANOCOBALAMIN) 1000 MCG tablet Take 1,000 mcg by mouth daily.   . ziprasidone (GEODON) 40 MG capsule Take 40 mg by mouth 2 (two) times daily with a  meal.    . gabapentin (NEURONTIN) 300 MG capsule Take 300 mg by mouth 3 (three) times daily as needed. 10/21/2016: Received from: External Pharmacy  . omeprazole (PRILOSEC) 20 MG capsule Take 20 mg by mouth daily as needed (Acid Reflux).    No facility-administered encounter medications on file as of 02/24/2020.    ALLERGIES:  Allergies  Allergen Reactions  . Horseradish [Armoracia Rusticana Ext (Horseradish)] Anaphylaxis     PHYSICAL EXAM:  ECOG Performance status: 1  Vitals:   02/24/20 0919  BP: Marland Kitchen)  135/59  Pulse: 70  Resp: 18  Temp: (!) 97.3 F (36.3 C)  SpO2: 100%   Filed Weights   02/24/20 0919  Weight: 182 lb (82.6 kg)    Physical Exam Constitutional:      Appearance: Normal appearance. He is normal weight.  Cardiovascular:     Rate and Rhythm: Normal rate and regular rhythm.     Heart sounds: Normal heart sounds.  Pulmonary:     Effort: Pulmonary effort is normal.     Breath sounds: Normal breath sounds.  Abdominal:     General: Bowel sounds are normal.     Palpations: Abdomen is soft.  Musculoskeletal:        General: Normal range of motion.  Skin:    General: Skin is warm.  Neurological:     Mental Status: He is alert and oriented to person, place, and time. Mental status is at baseline.  Psychiatric:        Mood and Affect: Mood normal.        Behavior: Behavior normal.        Thought Content: Thought content normal.        Judgment: Judgment normal.      LABORATORY DATA:  I have reviewed the labs as listed.  CBC    Component Value Date/Time   WBC 5.0 02/24/2020 0902   RBC 2.84 (L) 02/24/2020 0902   HGB 8.0 (L) 02/24/2020 0902   HCT 27.4 (L) 02/24/2020 0902   HCT 27.9 (L) 08/13/2016 0944   PLT 247 02/24/2020 0902   MCV 96.5 02/24/2020 0902   MCH 28.2 02/24/2020 0902   MCHC 29.2 (L) 02/24/2020 0902   RDW 16.2 (H) 02/24/2020 0902   LYMPHSABS 1.4 02/24/2020 0902   MONOABS 0.4 02/24/2020 0902   EOSABS 0.2 02/24/2020 0902   BASOSABS 0.0  02/24/2020 0902   CMP Latest Ref Rng & Units 02/24/2020 12/19/2019 09/26/2019  Glucose 70 - 99 mg/dL 83 92 94  BUN 8 - 23 mg/dL 14 16 25(H)  Creatinine 0.61 - 1.24 mg/dL 1.22 1.35(H) 1.16  Sodium 135 - 145 mmol/L 139 139 136  Potassium 3.5 - 5.1 mmol/L 4.4 4.3 4.5  Chloride 98 - 111 mmol/L 109 102 104  CO2 22 - 32 mmol/L 24 27 22   Calcium 8.9 - 10.3 mg/dL 9.0 8.9 9.2  Total Protein 6.5 - 8.1 g/dL 6.8 6.8 7.9  Total Bilirubin 0.3 - 1.2 mg/dL 0.6 0.6 0.6  Alkaline Phos 38 - 126 U/L 54 60 79  AST 15 - 41 U/L 13(L) 21 14(L)  ALT 0 - 44 U/L 9 15 12    I personally performed a face-to-face visit.  All questions were answered to patient's stated satisfaction. Encouraged patient to call with any new concerns or questions before his next visit to the cancer center and we can certain see him sooner, if needed.     ASSESSMENT & PLAN:   Anemia 1.  Normocytic normochromic anemia: - It is felt that his chronic alcohol abuse is felt to be the contributing factor to his anemia given its toxicity on the bone marrow. - His anemia dates back to at least 2014 with a negative peripheral work-up, negative bone marrow aspiration and biopsy and cytogenetics on 10/16/2016 in the setting of alcohol abuse.  The bone marrow showed slightly hypercellular marrow with trilineage hematopoiesis.  Chromosome analysis was normal. -Colonoscopy on 06/03/2019 showed 4 tubular adenomas and external hemorrhoids.  EGD showed normal proximal and distal esophagus.  Normal stomach.  Duodenitis.  Grade a esophagitis. -Last Feraheme infusion was on 05/26/2019 and 06/02/2019. -It was reported that he was on iron tablets in the past and tolerated fairly well.  He was placed back on oral iron.  He is taking without problems. - He is a Jehovah witness and does not receive blood products.  He does except platelets but his platelets are good at 318. -He is now on Retacrit 20,000 units every 2 weeks.  He missed injections in October and  December.  Also missed injections at the end of January and all of February. -Labs done on 02/24/2020 showed his hemoglobin has dropped to 8.0. -Patient agreed to come every 2 weeks for the next 6 weeks to increase his counts then we will try to increase him to every 3 weeks. - He will follow-up in 6 weeks with repeat labs.  2.  EtOH abuse/depression: - It has been previously discussed several times regarding AA. -He reportedly had PTSD since he was in [place].   -He follows up with Dr. Langston Masker in [facility].       Orders placed this encounter:  Orders Placed This Encounter  Procedures  . Lactate dehydrogenase  . CBC with Differential/Platelet  . Comprehensive metabolic panel  . Ferritin  . Iron and TIBC  . Vitamin B12  . VITAMIN D 25 Hydroxy (Vit-D Deficiency, Fractures)  . Folate      Francene Finders, FNP-C [place] [phone]

## 2020-02-24 NOTE — Patient Instructions (Signed)
[facility] at [facility] Discharge Instructions  Follow up in 6 weeks with labs    Thank you for choosing [facility] at [facility] to provide your oncology and hematology care.  To afford each patient quality time with our provider, please arrive at least 15 minutes before your scheduled appointment time.   If you have a lab appointment with the Cancer Center please come in thru the Main Entrance and check in at the main information desk.  You need to re-schedule your appointment should you arrive 10 or more minutes late.  We strive to give you quality time with our providers, and arriving late affects you and other patients whose appointments are after yours.  Also, if you no show three or more times for appointments you may be dismissed from the clinic at the providers discretion.     Again, thank you for choosing [facility].  Our hope is that these requests will decrease the amount of time that you wait before being seen by our physicians.       _____________________________________________________________  Should you have questions after your visit to [facility], please contact our office at [phone] between the hours of 8:00 a.m. and 4:30 p.m.  Voicemails left after 4:00 p.m. will not be returned until the following business day.  For prescription refill requests, have your pharmacy contact our office and allow 72 hours.    Due to Covid, you will need to wear a mask upon entering the hospital. If you do not have a mask, a mask will be given to you at the Main Entrance upon arrival. For doctor visits, patients may have 1 support person with them. For treatment visits, patients can not have anyone with them due to social distancing guidelines and our immunocompromised population.      

## 2020-02-24 NOTE — Assessment & Plan Note (Addendum)
1.  Normocytic normochromic anemia: - It is felt that his chronic alcohol abuse is felt to be the contributing factor to his anemia given its toxicity on the bone marrow. - His anemia dates back to at least 2014 with a negative peripheral work-up, negative bone marrow aspiration and biopsy and cytogenetics on 10/16/2016 in the setting of alcohol abuse.  The bone marrow showed slightly hypercellular marrow with trilineage hematopoiesis.  Chromosome analysis was normal. -Colonoscopy on 06/03/2019 showed 4 tubular adenomas and external hemorrhoids.  EGD showed normal proximal and distal esophagus.  Normal stomach.  Duodenitis.  Grade a esophagitis. -Last Feraheme infusion was on 05/26/2019 and 06/02/2019. -It was reported that he was on iron tablets in the past and tolerated fairly well.  He was placed back on oral iron.  He is taking without problems. - He is a Jehovah witness and does not receive blood products.  He does except platelets but his platelets are good at 318. -He is now on Retacrit 20,000 units every 2 weeks.  He missed injections in October and December.  Also missed injections at the end of January and all of February. -Labs done on 02/24/2020 showed his hemoglobin has dropped to 8.0. -Patient agreed to come every 2 weeks for the next 6 weeks to increase his counts then we will try to increase him to every 3 weeks. - He will follow-up in 6 weeks with repeat labs.  2.  EtOH abuse/depression: - It has been previously discussed several times regarding AA. -He reportedly had PTSD since he was in [place].   -He follows up with Dr. Langston Masker in [facility].

## 2020-02-24 NOTE — Patient Instructions (Signed)
[place] at [facility]  Discharge Instructions:  You received your Retacrit injection today. Follow up as scheduled. _______________________________________________________________  Thank you for choosing [place] at [facility] to provide your oncology and hematology care.  To afford each patient quality time with our providers, please arrive at least 15 minutes before your scheduled appointment.  You need to re-schedule your appointment if you arrive 10 or more minutes late.  We strive to give you quality time with our providers, and arriving late affects you and other patients whose appointments are after yours.  Also, if you no show three or more times for appointments you may be dismissed from the clinic.  Again, thank you for choosing [place] [facility] [place] hope is that these requests will allow you access to exceptional care and in a timely manner. _______________________________________________________________  If you have questions after your visit, please contact our office at [phone] between the hours of 8:30 a.m. and 5:00 p.m. Voicemails left after 4:30 p.m. will not be returned until the following business day. _______________________________________________________________  For prescription refill requests, have your pharmacy contact our office. _______________________________________________________________  Recommendations made by the consultant and any test results will be sent to your referring physician. _______________________________________________________________

## 2020-02-24 NOTE — Progress Notes (Signed)
Roger Hansen presents today for Retacrit injection. Hemoglobin reviewed prior to administration. VSS. Injection tolerated without incident or complaint. See MAR for details. Patient discharged in satisfactory condition with follow up instructions.

## 2020-02-29 DIAGNOSIS — Z23 Encounter for immunization: Secondary | ICD-10-CM | POA: Diagnosis not present

## 2020-03-09 ENCOUNTER — Inpatient Hospital Stay (HOSPITAL_COMMUNITY): Payer: Medicare Other

## 2020-03-09 ENCOUNTER — Other Ambulatory Visit: Payer: Self-pay

## 2020-03-09 ENCOUNTER — Encounter (HOSPITAL_COMMUNITY): Payer: Self-pay

## 2020-03-09 VITALS — BP 110/65 | HR 82 | Temp 97.7°F | Resp 18

## 2020-03-09 DIAGNOSIS — Z7982 Long term (current) use of aspirin: Secondary | ICD-10-CM | POA: Diagnosis not present

## 2020-03-09 DIAGNOSIS — Z79899 Other long term (current) drug therapy: Secondary | ICD-10-CM | POA: Diagnosis not present

## 2020-03-09 DIAGNOSIS — N183 Chronic kidney disease, stage 3 unspecified: Secondary | ICD-10-CM | POA: Diagnosis not present

## 2020-03-09 DIAGNOSIS — D631 Anemia in chronic kidney disease: Secondary | ICD-10-CM | POA: Diagnosis not present

## 2020-03-09 DIAGNOSIS — D649 Anemia, unspecified: Secondary | ICD-10-CM

## 2020-03-09 DIAGNOSIS — F329 Major depressive disorder, single episode, unspecified: Secondary | ICD-10-CM | POA: Diagnosis not present

## 2020-03-09 DIAGNOSIS — Z87891 Personal history of nicotine dependence: Secondary | ICD-10-CM | POA: Diagnosis not present

## 2020-03-09 LAB — CBC
HCT: 31.2 % — ABNORMAL LOW (ref 39.0–52.0)
Hemoglobin: 9.6 g/dL — ABNORMAL LOW (ref 13.0–17.0)
MCH: 28.5 pg (ref 26.0–34.0)
MCHC: 30.8 g/dL (ref 30.0–36.0)
MCV: 92.6 fL (ref 80.0–100.0)
Platelets: 271 10*3/uL (ref 150–400)
RBC: 3.37 MIL/uL — ABNORMAL LOW (ref 4.22–5.81)
RDW: 16 % — ABNORMAL HIGH (ref 11.5–15.5)
WBC: 5.7 10*3/uL (ref 4.0–10.5)
nRBC: 0 % (ref 0.0–0.2)

## 2020-03-09 MED ORDER — EPOETIN ALFA-EPBX 10000 UNIT/ML IJ SOLN
20000.0000 [IU] | Freq: Once | INTRAMUSCULAR | Status: AC
  Administered 2020-03-09: 20000 [IU] via SUBCUTANEOUS
  Filled 2020-03-09: qty 2

## 2020-03-09 NOTE — Progress Notes (Signed)
Patient presents today for Retacrit injection. Hemoglobin reviewed prior to administration. VSS. Injection tolerated without incident or complaint. See MAR for details. Patient discharged in satisfactory condition with follow up instructions. 

## 2020-03-23 ENCOUNTER — Inpatient Hospital Stay (HOSPITAL_COMMUNITY): Payer: Medicare Other

## 2020-03-23 ENCOUNTER — Other Ambulatory Visit (HOSPITAL_COMMUNITY): Payer: Self-pay | Admitting: Nurse Practitioner

## 2020-03-23 ENCOUNTER — Inpatient Hospital Stay (HOSPITAL_COMMUNITY): Payer: Medicare Other | Attending: Hematology

## 2020-03-23 ENCOUNTER — Other Ambulatory Visit: Payer: Self-pay

## 2020-03-23 VITALS — BP 121/64 | HR 88 | Temp 97.1°F | Resp 16

## 2020-03-23 DIAGNOSIS — N183 Chronic kidney disease, stage 3 unspecified: Secondary | ICD-10-CM | POA: Diagnosis not present

## 2020-03-23 DIAGNOSIS — D649 Anemia, unspecified: Secondary | ICD-10-CM

## 2020-03-23 DIAGNOSIS — D631 Anemia in chronic kidney disease: Secondary | ICD-10-CM | POA: Diagnosis not present

## 2020-03-23 LAB — CBC
HCT: 27.8 % — ABNORMAL LOW (ref 39.0–52.0)
Hemoglobin: 8.5 g/dL — ABNORMAL LOW (ref 13.0–17.0)
MCH: 28.3 pg (ref 26.0–34.0)
MCHC: 30.6 g/dL (ref 30.0–36.0)
MCV: 92.7 fL (ref 80.0–100.0)
Platelets: 173 10*3/uL (ref 150–400)
RBC: 3 MIL/uL — ABNORMAL LOW (ref 4.22–5.81)
RDW: 17.3 % — ABNORMAL HIGH (ref 11.5–15.5)
WBC: 4 10*3/uL (ref 4.0–10.5)
nRBC: 0 % (ref 0.0–0.2)

## 2020-03-23 MED ORDER — EPOETIN ALFA-EPBX 10000 UNIT/ML IJ SOLN
30000.0000 [IU] | Freq: Once | INTRAMUSCULAR | Status: AC
  Administered 2020-03-23: 30000 [IU] via SUBCUTANEOUS

## 2020-03-23 MED ORDER — EPOETIN ALFA-EPBX 10000 UNIT/ML IJ SOLN
20000.0000 [IU] | Freq: Once | INTRAMUSCULAR | Status: DC
  Filled 2020-03-23: qty 2

## 2020-03-23 NOTE — Patient Instructions (Signed)
[facility] at [facility]  Discharge Instructions:   _______________________________________________________________  Thank you for choosing [facility] at [facility] to provide your oncology and hematology care.  To afford each patient quality time with our providers, please arrive at least 15 minutes before your scheduled appointment.  You need to re-schedule your appointment if you arrive 10 or more minutes late.  We strive to give you quality time with our providers, and arriving late affects you and other patients whose appointments are after yours.  Also, if you no show three or more times for appointments you may be dismissed from the clinic.  Again, thank you for choosing [facility] at [facility]. Our hope is that these requests will allow you access to exceptional care and in a timely manner. _______________________________________________________________  If you have questions after your visit, please contact our office at [phone] between the hours of 8:30 a.m. and 5:00 p.m. Voicemails left after 4:30 p.m. will not be returned until the following business day. _______________________________________________________________  For prescription refill requests, have your pharmacy contact our office. _______________________________________________________________  Recommendations made by the consultant and any test results will be sent to your referring physician. _______________________________________________________________ 

## 2020-03-23 NOTE — Progress Notes (Signed)
Patient here today for retacrit injection. Hemoglobin is 8.5 today. Patient has been on same dose for a while. Francene Finders NP notified and dose was increased to 30,000 units per NP.   Reticrit given per orders. Patient tolerated it well without problems. Vitals stable and discharged home from clinic ambulatory. Follow up as scheduled.

## 2020-03-28 DIAGNOSIS — Z23 Encounter for immunization: Secondary | ICD-10-CM | POA: Diagnosis not present

## 2020-04-06 ENCOUNTER — Other Ambulatory Visit: Payer: Self-pay

## 2020-04-06 ENCOUNTER — Inpatient Hospital Stay (HOSPITAL_BASED_OUTPATIENT_CLINIC_OR_DEPARTMENT_OTHER): Payer: Medicare Other | Admitting: Nurse Practitioner

## 2020-04-06 ENCOUNTER — Inpatient Hospital Stay (HOSPITAL_COMMUNITY): Payer: Medicare Other

## 2020-04-06 DIAGNOSIS — D649 Anemia, unspecified: Secondary | ICD-10-CM | POA: Diagnosis not present

## 2020-04-06 DIAGNOSIS — D631 Anemia in chronic kidney disease: Secondary | ICD-10-CM | POA: Diagnosis not present

## 2020-04-06 DIAGNOSIS — N183 Chronic kidney disease, stage 3 unspecified: Secondary | ICD-10-CM

## 2020-04-06 LAB — CBC WITH DIFFERENTIAL/PLATELET
Abs Immature Granulocytes: 0.03 10*3/uL (ref 0.00–0.07)
Basophils Absolute: 0 10*3/uL (ref 0.0–0.1)
Basophils Relative: 1 %
Eosinophils Absolute: 0.2 10*3/uL (ref 0.0–0.5)
Eosinophils Relative: 3 %
HCT: 27.4 % — ABNORMAL LOW (ref 39.0–52.0)
Hemoglobin: 8.4 g/dL — ABNORMAL LOW (ref 13.0–17.0)
Immature Granulocytes: 1 %
Lymphocytes Relative: 25 %
Lymphs Abs: 1.4 10*3/uL (ref 0.7–4.0)
MCH: 28.9 pg (ref 26.0–34.0)
MCHC: 30.7 g/dL (ref 30.0–36.0)
MCV: 94.2 fL (ref 80.0–100.0)
Monocytes Absolute: 0.6 10*3/uL (ref 0.1–1.0)
Monocytes Relative: 11 %
Neutro Abs: 3.3 10*3/uL (ref 1.7–7.7)
Neutrophils Relative %: 59 %
Platelets: 170 10*3/uL (ref 150–400)
RBC: 2.91 MIL/uL — ABNORMAL LOW (ref 4.22–5.81)
RDW: 17.9 % — ABNORMAL HIGH (ref 11.5–15.5)
WBC: 5.4 10*3/uL (ref 4.0–10.5)
nRBC: 0 % (ref 0.0–0.2)

## 2020-04-06 LAB — FOLATE: Folate: 18.2 ng/mL (ref >5.9)

## 2020-04-06 LAB — COMPREHENSIVE METABOLIC PANEL
ALT: 11 U/L (ref 0–44)
AST: 13 U/L — ABNORMAL LOW (ref 15–41)
Albumin: 3.6 g/dL (ref 3.5–5.0)
Alkaline Phosphatase: 56 U/L (ref 38–126)
Anion gap: 11 (ref 5–15)
BUN: 20 mg/dL (ref 8–23)
CO2: 26 mmol/L (ref 22–32)
Calcium: 8.5 mg/dL — ABNORMAL LOW (ref 8.9–10.3)
Chloride: 101 mmol/L (ref 98–111)
Creatinine, Ser: 1.49 mg/dL — ABNORMAL HIGH (ref 0.61–1.24)
GFR calc Af Amer: 54 mL/min — ABNORMAL LOW (ref >60)
GFR calc non Af Amer: 47 mL/min — ABNORMAL LOW (ref >60)
Glucose, Bld: 84 mg/dL (ref 70–99)
Potassium: 4 mmol/L (ref 3.5–5.1)
Sodium: 138 mmol/L (ref 135–145)
Total Bilirubin: 0.5 mg/dL (ref 0.3–1.2)
Total Protein: 6.8 g/dL (ref 6.5–8.1)

## 2020-04-06 LAB — IRON AND TIBC
Iron: 25 ug/dL — ABNORMAL LOW (ref 45–182)
Saturation Ratios: 11 % — ABNORMAL LOW (ref 17.9–39.5)
TIBC: 224 ug/dL — ABNORMAL LOW (ref 250–450)
UIBC: 199 ug/dL

## 2020-04-06 LAB — VITAMIN B12: Vitamin B-12: 581 pg/mL (ref 180–914)

## 2020-04-06 LAB — LACTATE DEHYDROGENASE: LDH: 115 U/L (ref 98–192)

## 2020-04-06 LAB — VITAMIN D 25 HYDROXY (VIT D DEFICIENCY, FRACTURES): Vit D, 25-Hydroxy: 59.24 ng/mL (ref 30–100)

## 2020-04-06 LAB — FERRITIN: Ferritin: 371 ng/mL — ABNORMAL HIGH (ref 24–336)

## 2020-04-06 MED ORDER — EPOETIN ALFA-EPBX 40000 UNIT/ML IJ SOLN
INTRAMUSCULAR | Status: AC
  Filled 2020-04-06: qty 1

## 2020-04-06 MED ORDER — EPOETIN ALFA-EPBX 40000 UNIT/ML IJ SOLN
40000.0000 [IU] | Freq: Once | INTRAMUSCULAR | Status: AC
  Administered 2020-04-06: 40000 [IU] via SUBCUTANEOUS

## 2020-04-06 MED ORDER — EPOETIN ALFA-EPBX 10000 UNIT/ML IJ SOLN: 40000.0000 [IU] | Freq: Once | INTRAMUSCULAR | Status: DC

## 2020-04-06 NOTE — Assessment & Plan Note (Signed)
1.  Normocytic normochromic anemia: - It is felt that his chronic alcohol abuse is felt to be the contributing factor to his anemia given its toxicity on the bone marrow. - His anemia dates back to at least 2014 with a negative peripheral work-up, negative bone marrow aspiration and biopsy and cytogenetics on 10/16/2016 in the setting of alcohol abuse.  The bone marrow showed slightly hypercellular marrow with trilineage hematopoiesis.  Chromosome analysis was normal. -Colonoscopy on 06/03/2019 showed 4 tubular adenomas and external hemorrhoids.  EGD showed normal proximal and distal esophagus.  Normal stomach.  Duodenitis.  Grade a esophagitis. -Last Feraheme infusion was on 05/26/2019 and 06/02/2019. -It was reported that he was on iron tablets in the past and tolerated fairly well.  He was placed back on oral iron.  He is taking without problems. - He is a Jehovah witness and does not receive blood products.  He does except platelets but his platelets are good at 318. -He is now on Retacrit 30,000 units every 2 weeks.  He missed injections in October and December.  Also missed injections at the end of January and all of February. -Labs done on 04/06/2020 showed his hemoglobin is 8.4, ferritin 371, percent saturation 11 -We have increased his Retacrit to 40,000 units every 2 weeks. -We also are getting give him 2 infusions of IV iron.  Due to his fatigue. -Patient agreed to come every 2 weeks for the next 6 weeks to increase his counts then we will try to increase him to every 3 weeks. - He will follow-up in 6 weeks with repeat labs.  2.  EtOH abuse/depression: - It has been previously discussed several times regarding AA. -He reportedly had PTSD since he was in [place].   -He follows up with Dr. Langston Masker in [facility].

## 2020-04-06 NOTE — Progress Notes (Signed)
Roger Hansen, [place] [id number]   CLINIC:  Medical Oncology/Hematology  PCP:  Curlene Labrum, MD [place] [id number] [phone]   REASON FOR VISIT: Follow-up for normocytic anemia   CURRENT THERAPY: Retacrit injections every 2 weeks   INTERVAL HISTORY:  Roger Hansen 74 y.o. male returns for routine follow-up for normocytic anemia.  Patient reports he is doing well since his last visit.  He does report some fatigue.  He denies any bright red bleeding per rectum or melena.  He denies any easy bruising or bleeding. Denies any nausea, vomiting, or diarrhea. Denies any new pains. Had not noticed any recent bleeding such as epistaxis, hematuria or hematochezia. Denies recent chest pain on exertion, shortness of breath on minimal exertion, pre-syncopal episodes, or palpitations. Denies any numbness or tingling in hands or feet. Denies any recent fevers, infections, or recent hospitalizations. Patient reports appetite at 75% and energy level at 50%.  He is eating well maintain his weight at this time.     REVIEW OF SYSTEMS:  Review of Systems  Constitutional: Positive for fatigue.  Gastrointestinal: Positive for constipation.  Psychiatric/Behavioral: Positive for depression and sleep disturbance.  All other systems reviewed and are negative.    PAST MEDICAL/SURGICAL HISTORY:  Past Medical History:  Diagnosis Date  . Alcohol abuse 11/24/2016  . Anemia 10/11/2013  . Chronic renal disease, stage 3, moderately decreased glomerular filtration rate (GFR) between 30-59 mL/min/1.73 square meter 12/02/2016  . Colon polyps   . GERD (gastroesophageal reflux disease)   . High cholesterol   . Hypertension   . Sinus infection    Past Surgical History:  Procedure Laterality Date  . BIOPSY  06/03/2019   Procedure: BIOPSY;  Surgeon: Rogene Houston, MD;  [facility];  Service: Endoscopy;;  esophagus  . COLONOSCOPY N/A 10/26/2013   Procedure: COLONOSCOPY;  Surgeon: Rogene Houston, MD;  [facility]: [facility];  Service: Endoscopy;  Laterality: N/A;  325-moved to 1230 Ann to notify pt  . Colonoscopy with polypectomy    . COLONOSCOPY WITH PROPOFOL N/A 06/03/2019   Procedure: COLONOSCOPY WITH PROPOFOL;  Surgeon: Rogene Houston, MD;  [facility]: [facility];  Service: Endoscopy;  Laterality: N/A;  . ESOPHAGOGASTRODUODENOSCOPY (EGD) WITH PROPOFOL N/A 06/03/2019   Procedure: ESOPHAGOGASTRODUODENOSCOPY (EGD) WITH PROPOFOL;  Surgeon: Rogene Houston, MD;  [facility]: [facility];  Service: Endoscopy;  Laterality: N/A;  . POLYPECTOMY  06/03/2019   Procedure: POLYPECTOMY;  Surgeon: Rogene Houston, MD;  [facility]: [facility];  Service: Endoscopy;;  colon     SOCIAL HISTORY:  Social History   Socioeconomic History  . Marital status: Married    Spouse name: Not on file  . Number of children: Not on file  . Years of education: Not on file  . Highest education level: Not on file  Occupational History  . Not on file  Tobacco Use  . Smoking status: Former Smoker    Packs/day: 0.50    Years: 20.00    Pack years: 10.00    Quit date: 12/27/1971    Years since quitting: 48.3  . Smokeless tobacco: Never Used  Substance and Sexual Activity  . Alcohol use: Yes    Comment: 1 pint of etoh a day ( not recently)  . Drug use: No  . Sexual activity: Not on file  Other Topics Concern  . Not on file  Social History Narrative  . Not on  file   Social Determinants of Health   Financial Resource Strain:   . Difficulty of Paying Living Expenses:   Food Insecurity:   . Worried About Charity fundraiser in the Last Year:   . Arboriculturist in the Last Year:   Transportation Needs:   . Film/video editor (Medical):   Marland Kitchen Lack of Transportation (Non-Medical):   Physical Activity:   . Days of Exercise per Week:   . Minutes of Exercise per Session:   Stress:   . Feeling of Stress :   Social Connections:   . Frequency of  Communication with Friends and Family:   . Frequency of Social Gatherings with Friends and Family:   . Attends Religious Services:   . Active Member of Clubs or Organizations:   . Attends Archivist Meetings:   Marland Kitchen Marital Status:   Intimate Partner Violence:   . Fear of Current or Ex-Partner:   . Emotionally Abused:   Marland Kitchen Physically Abused:   . Sexually Abused:     FAMILY HISTORY:  Family History  Problem Relation Age of Onset  . Hypertension Mother   . Colon cancer Neg Hx     CURRENT MEDICATIONS:  Outpatient Encounter Medications as of 04/06/2020  Medication Sig Note  . aspirin 81 MG tablet Take 1 tablet (81 mg total) by mouth daily.   Marland Kitchen atorvastatin (LIPITOR) 10 MG tablet Take 10 mg by mouth daily.   Marland Kitchen buPROPion (WELLBUTRIN SR) 150 MG 12 hr tablet Take 150 mg by mouth daily.    . busPIRone (BUSPAR) 30 MG tablet Take 30 mg by mouth 3 (three) times daily.   . cholecalciferol (VITAMIN D) 1000 UNITS tablet Take 1,000 Units by mouth 2 (two) times daily.   . Eszopiclone 3 MG TABS Take 3 mg by mouth at bedtime.    . Ferrous Sulfate (IRON) 325 (65 Fe) MG TABS TAKE ONE TABLET BY MOUTH DAILY WITH BREAKFAST. (Patient taking differently: 1 tablet 2 (two) times a day. )   . folic acid (FOLVITE) 1 MG tablet Take 1 mg by mouth daily.   . furosemide (LASIX) 20 MG tablet Take 20 mg by mouth daily.    Marland Kitchen GNP VITAMIN B-1 100 MG tablet Take 100 mg by mouth daily.    Marland Kitchen losartan (COZAAR) 50 MG tablet Take 50 mg by mouth daily.   Marland Kitchen omeprazole (PRILOSEC) 20 MG capsule Take 20 mg by mouth daily as needed (Acid Reflux).   . potassium chloride SA (K-DUR) 20 MEQ tablet Take 20 mEq by mouth daily.    . vitamin B-12 (CYANOCOBALAMIN) 1000 MCG tablet Take 1,000 mcg by mouth daily.   . ziprasidone (GEODON) 40 MG capsule Take 40 mg by mouth 2 (two) times daily with a meal.    . gabapentin (NEURONTIN) 300 MG capsule Take 300 mg by mouth 3 (three) times daily as needed. 10/21/2016: Received from: External  Pharmacy  . traZODone (DESYREL) 100 MG tablet Take 100 mg by mouth at bedtime.    No facility-administered encounter medications on file as of 04/06/2020.    ALLERGIES:  Allergies  Allergen Reactions  . Horseradish [Armoracia Rusticana Ext (Horseradish)] Anaphylaxis     PHYSICAL EXAM:  ECOG Performance status: 1  Vitals:   04/06/20 0927  BP: 123/61  Pulse: 81  Resp: 19  Temp: (!) 97.3 F (36.3 C)  SpO2: 97%   Filed Weights   04/06/20 0927  Weight: 181 lb 9.6 oz (82.4 kg)  Physical Exam Constitutional:      Appearance: Normal appearance. He is normal weight.  Cardiovascular:     Rate and Rhythm: Normal rate and regular rhythm.     Heart sounds: Normal heart sounds.  Pulmonary:     Effort: Pulmonary effort is normal.     Breath sounds: Normal breath sounds.  Abdominal:     General: Bowel sounds are normal.     Palpations: Abdomen is soft.  Musculoskeletal:        General: Normal range of motion.  Skin:    General: Skin is warm.  Neurological:     Mental Status: He is alert and oriented to person, place, and time. Mental status is at baseline.  Psychiatric:        Mood and Affect: Mood normal.        Behavior: Behavior normal.        Thought Content: Thought content normal.        Judgment: Judgment normal.      LABORATORY DATA:  I have reviewed the labs as listed.  CBC    Component Value Date/Time   WBC 5.4 04/06/2020 0912   RBC 2.91 (L) 04/06/2020 0912   HGB 8.4 (L) 04/06/2020 0912   HCT 27.4 (L) 04/06/2020 0912   HCT 27.9 (L) 08/13/2016 0944   PLT 170 04/06/2020 0912   MCV 94.2 04/06/2020 0912   MCH 28.9 04/06/2020 0912   MCHC 30.7 04/06/2020 0912   RDW 17.9 (H) 04/06/2020 0912   LYMPHSABS 1.4 04/06/2020 0912   MONOABS 0.6 04/06/2020 0912   EOSABS 0.2 04/06/2020 0912   BASOSABS 0.0 04/06/2020 0912   CMP Latest Ref Rng & Units 04/06/2020 02/24/2020 12/19/2019  Glucose 70 - 99 mg/dL 84 83 92  BUN 8 - 23 mg/dL 20 14 16   Creatinine 0.61 - 1.24  mg/dL 1.49(H) 1.22 1.35(H)  Sodium 135 - 145 mmol/L 138 139 139  Potassium 3.5 - 5.1 mmol/L 4.0 4.4 4.3  Chloride 98 - 111 mmol/L 101 109 102  CO2 22 - 32 mmol/L 26 24 27   Calcium 8.9 - 10.3 mg/dL 8.5(L) 9.0 8.9  Total Protein 6.5 - 8.1 g/dL 6.8 6.8 6.8  Total Bilirubin 0.3 - 1.2 mg/dL 0.5 0.6 0.6  Alkaline Phos 38 - 126 U/L 56 54 60  AST 15 - 41 U/L 13(L) 13(L) 21  ALT 0 - 44 U/L 11 9 15     All questions were answered to patient's stated satisfaction. Encouraged patient to call with any new concerns or questions before his next visit to the cancer center and we can certain see him sooner, if needed.     ASSESSMENT & PLAN:  Anemia 1.  Normocytic normochromic anemia: - It is felt that his chronic alcohol abuse is felt to be the contributing factor to his anemia given its toxicity on the bone marrow. - His anemia dates back to at least 2014 with a negative peripheral work-up, negative bone marrow aspiration and biopsy and cytogenetics on 10/16/2016 in the setting of alcohol abuse.  The bone marrow showed slightly hypercellular marrow with trilineage hematopoiesis.  Chromosome analysis was normal. -Colonoscopy on 06/03/2019 showed 4 tubular adenomas and external hemorrhoids.  EGD showed normal proximal and distal esophagus.  Normal stomach.  Duodenitis.  Grade a esophagitis. -Last Feraheme infusion was on 05/26/2019 and 06/02/2019. -It was reported that he was on iron tablets in the past and tolerated fairly well.  He was placed back on oral iron.  He is taking  without problems. - He is a Jehovah witness and does not receive blood products.  He does except platelets but his platelets are good at 318. -He is now on Retacrit 30,000 units every 2 weeks.  He missed injections in October and December.  Also missed injections at the end of January and all of February. -Labs done on 04/06/2020 showed his hemoglobin is 8.4, ferritin 371, percent saturation 11 -We have increased his Retacrit to 40,000  units every 2 weeks. -We also are getting give him 2 infusions of IV iron.  Due to his fatigue. -Patient agreed to come every 2 weeks for the next 6 weeks to increase his counts then we will try to increase him to every 3 weeks. - He will follow-up in 6 weeks with repeat labs.  2.  EtOH abuse/depression: - It has been previously discussed several times regarding AA. -He reportedly had PTSD since he was in [place].   -He follows up with Dr. Langston Masker in [facility].      Orders placed this encounter:  Orders Placed This Encounter  Procedures  . CBC with Differential/Platelet  . Comprehensive metabolic panel      Francene Finders, FNP-C [place] [phone]

## 2020-04-09 DIAGNOSIS — R5383 Other fatigue: Secondary | ICD-10-CM | POA: Diagnosis not present

## 2020-04-09 DIAGNOSIS — E871 Hypo-osmolality and hyponatremia: Secondary | ICD-10-CM | POA: Diagnosis not present

## 2020-04-09 DIAGNOSIS — N183 Chronic kidney disease, stage 3 unspecified: Secondary | ICD-10-CM | POA: Diagnosis not present

## 2020-04-09 DIAGNOSIS — I10 Essential (primary) hypertension: Secondary | ICD-10-CM | POA: Diagnosis not present

## 2020-04-13 ENCOUNTER — Ambulatory Visit (HOSPITAL_COMMUNITY): Payer: Medicare Other

## 2020-04-20 ENCOUNTER — Other Ambulatory Visit: Payer: Self-pay

## 2020-04-20 ENCOUNTER — Inpatient Hospital Stay (HOSPITAL_COMMUNITY): Payer: Medicare Other

## 2020-04-20 ENCOUNTER — Inpatient Hospital Stay (HOSPITAL_COMMUNITY): Payer: Medicare Other | Attending: Hematology

## 2020-04-20 VITALS — BP 109/53 | HR 71 | Temp 96.6°F | Resp 17

## 2020-04-20 DIAGNOSIS — D631 Anemia in chronic kidney disease: Secondary | ICD-10-CM | POA: Insufficient documentation

## 2020-04-20 DIAGNOSIS — F1011 Alcohol abuse, in remission: Secondary | ICD-10-CM | POA: Insufficient documentation

## 2020-04-20 DIAGNOSIS — D649 Anemia, unspecified: Secondary | ICD-10-CM | POA: Diagnosis not present

## 2020-04-20 DIAGNOSIS — N183 Chronic kidney disease, stage 3 unspecified: Secondary | ICD-10-CM | POA: Diagnosis not present

## 2020-04-20 DIAGNOSIS — E611 Iron deficiency: Secondary | ICD-10-CM | POA: Diagnosis not present

## 2020-04-20 LAB — CBC
HCT: 27.6 % — ABNORMAL LOW (ref 39.0–52.0)
Hemoglobin: 8.5 g/dL — ABNORMAL LOW (ref 13.0–17.0)
MCH: 29.4 pg (ref 26.0–34.0)
MCHC: 30.8 g/dL (ref 30.0–36.0)
MCV: 95.5 fL (ref 80.0–100.0)
Platelets: 198 10*3/uL (ref 150–400)
RBC: 2.89 MIL/uL — ABNORMAL LOW (ref 4.22–5.81)
RDW: 18.9 % — ABNORMAL HIGH (ref 11.5–15.5)
WBC: 5.6 10*3/uL (ref 4.0–10.5)
nRBC: 0 % (ref 0.0–0.2)

## 2020-04-20 MED ORDER — EPOETIN ALFA-EPBX 40000 UNIT/ML IJ SOLN
40000.0000 [IU] | Freq: Once | INTRAMUSCULAR | Status: AC
  Administered 2020-04-20: 40000 [IU] via SUBCUTANEOUS
  Filled 2020-04-20: qty 1

## 2020-04-20 NOTE — Progress Notes (Signed)
.  Patient tolerated injection with no complaints voiced.  Site clean and dry with no bruising or swelling noted.  No complaints of pain.  Discharged with vital signs stable and no signs or symptoms of distress noted.   

## 2020-04-27 ENCOUNTER — Ambulatory Visit (HOSPITAL_COMMUNITY): Payer: Medicare Other

## 2020-05-01 ENCOUNTER — Other Ambulatory Visit: Payer: Self-pay

## 2020-05-01 ENCOUNTER — Inpatient Hospital Stay (HOSPITAL_COMMUNITY): Payer: Medicare Other

## 2020-05-01 DIAGNOSIS — D649 Anemia, unspecified: Secondary | ICD-10-CM

## 2020-05-01 DIAGNOSIS — N183 Chronic kidney disease, stage 3 unspecified: Secondary | ICD-10-CM | POA: Diagnosis not present

## 2020-05-01 LAB — CBC WITH DIFFERENTIAL/PLATELET
Abs Immature Granulocytes: 0.02 10*3/uL (ref 0.00–0.07)
Basophils Absolute: 0 10*3/uL (ref 0.0–0.1)
Basophils Relative: 1 %
Eosinophils Absolute: 0.1 10*3/uL (ref 0.0–0.5)
Eosinophils Relative: 3 %
HCT: 29.6 % — ABNORMAL LOW (ref 39.0–52.0)
Hemoglobin: 9.4 g/dL — ABNORMAL LOW (ref 13.0–17.0)
Immature Granulocytes: 0 %
Lymphocytes Relative: 29 %
Lymphs Abs: 1.5 10*3/uL (ref 0.7–4.0)
MCH: 29.5 pg (ref 26.0–34.0)
MCHC: 31.8 g/dL (ref 30.0–36.0)
MCV: 92.8 fL (ref 80.0–100.0)
Monocytes Absolute: 0.3 10*3/uL (ref 0.1–1.0)
Monocytes Relative: 6 %
Neutro Abs: 3.2 10*3/uL (ref 1.7–7.7)
Neutrophils Relative %: 61 %
Platelets: 209 10*3/uL (ref 150–400)
RBC: 3.19 MIL/uL — ABNORMAL LOW (ref 4.22–5.81)
RDW: 19.6 % — ABNORMAL HIGH (ref 11.5–15.5)
WBC: 5.2 10*3/uL (ref 4.0–10.5)
nRBC: 0.6 % — ABNORMAL HIGH (ref 0.0–0.2)

## 2020-05-01 LAB — COMPREHENSIVE METABOLIC PANEL
ALT: 11 U/L (ref 0–44)
AST: 22 U/L (ref 15–41)
Albumin: 3.6 g/dL (ref 3.5–5.0)
Alkaline Phosphatase: 72 U/L (ref 38–126)
Anion gap: 13 (ref 5–15)
BUN: 9 mg/dL (ref 8–23)
CO2: 31 mmol/L (ref 22–32)
Calcium: 9.1 mg/dL (ref 8.9–10.3)
Chloride: 95 mmol/L — ABNORMAL LOW (ref 98–111)
Creatinine, Ser: 0.87 mg/dL (ref 0.61–1.24)
GFR calc Af Amer: >60 mL/min (ref >60)
GFR calc non Af Amer: >60 mL/min (ref >60)
Glucose, Bld: 105 mg/dL — ABNORMAL HIGH (ref 70–99)
Potassium: 3.4 mmol/L — ABNORMAL LOW (ref 3.5–5.1)
Sodium: 139 mmol/L (ref 135–145)
Total Bilirubin: 0.8 mg/dL (ref 0.3–1.2)
Total Protein: 6.4 g/dL — ABNORMAL LOW (ref 6.5–8.1)

## 2020-05-02 ENCOUNTER — Encounter (HOSPITAL_COMMUNITY): Payer: Self-pay

## 2020-05-02 ENCOUNTER — Other Ambulatory Visit: Payer: Self-pay

## 2020-05-02 ENCOUNTER — Inpatient Hospital Stay (HOSPITAL_COMMUNITY): Payer: Medicare Other

## 2020-05-02 VITALS — BP 120/65 | HR 70 | Temp 97.1°F | Resp 18

## 2020-05-02 DIAGNOSIS — D649 Anemia, unspecified: Secondary | ICD-10-CM | POA: Diagnosis not present

## 2020-05-02 DIAGNOSIS — N183 Chronic kidney disease, stage 3 unspecified: Secondary | ICD-10-CM

## 2020-05-02 MED ORDER — EPOETIN ALFA-EPBX 40000 UNIT/ML IJ SOLN: 40000.0000 [IU] | Freq: Once | INTRAMUSCULAR | Status: DC

## 2020-05-02 MED ORDER — SODIUM CHLORIDE 0.9 % IV SOLN
510.0000 mg | Freq: Once | INTRAVENOUS | Status: AC
  Administered 2020-05-02: 510 mg via INTRAVENOUS
  Filled 2020-05-02: qty 510

## 2020-05-02 MED ORDER — SODIUM CHLORIDE 0.9 % IV SOLN: Freq: Once | INTRAVENOUS | Status: AC

## 2020-05-02 NOTE — Progress Notes (Signed)
Patient to return later this week for Retacrit injection and verbalized understanding.    Patient tolerated iron infusion with no complaints voiced.  Peripheral IV site clean and dry with good blood return noted before and after infusion.  Band aid applied.  VSS with discharge and left ambulatory with no s/s of distress noted.

## 2020-05-04 ENCOUNTER — Other Ambulatory Visit: Payer: Self-pay

## 2020-05-04 ENCOUNTER — Inpatient Hospital Stay (HOSPITAL_COMMUNITY): Payer: Medicare Other

## 2020-05-04 ENCOUNTER — Other Ambulatory Visit (HOSPITAL_COMMUNITY): Payer: Medicare Other

## 2020-05-04 ENCOUNTER — Ambulatory Visit (HOSPITAL_COMMUNITY): Payer: Medicare Other

## 2020-05-04 VITALS — BP 128/52 | HR 83 | Temp 97.7°F | Resp 18

## 2020-05-04 DIAGNOSIS — D649 Anemia, unspecified: Secondary | ICD-10-CM

## 2020-05-04 DIAGNOSIS — N183 Chronic kidney disease, stage 3 unspecified: Secondary | ICD-10-CM

## 2020-05-04 MED ORDER — EPOETIN ALFA-EPBX 40000 UNIT/ML IJ SOLN
40000.0000 [IU] | Freq: Once | INTRAMUSCULAR | Status: AC
  Administered 2020-05-04: 40000 [IU] via SUBCUTANEOUS
  Filled 2020-05-04: qty 1

## 2020-05-04 NOTE — Progress Notes (Signed)
Roger Hansen presents today for injection per the provider's orders.  Retacrit administration without incident; injection site WNL; see MAR for injection details.  Patient tolerated procedure well and without incident.  No questions or complaints noted at this time. Pt d/c clinic ambulatory.  

## 2020-05-10 ENCOUNTER — Ambulatory Visit (HOSPITAL_COMMUNITY): Payer: Medicare Other

## 2020-05-11 ENCOUNTER — Ambulatory Visit (HOSPITAL_COMMUNITY): Payer: Medicare Other

## 2020-05-17 ENCOUNTER — Other Ambulatory Visit (HOSPITAL_COMMUNITY): Payer: Self-pay | Admitting: *Deleted

## 2020-05-17 DIAGNOSIS — D649 Anemia, unspecified: Secondary | ICD-10-CM

## 2020-05-17 DIAGNOSIS — D696 Thrombocytopenia, unspecified: Secondary | ICD-10-CM

## 2020-05-17 DIAGNOSIS — N183 Chronic kidney disease, stage 3 unspecified: Secondary | ICD-10-CM

## 2020-05-18 ENCOUNTER — Other Ambulatory Visit: Payer: Self-pay

## 2020-05-18 ENCOUNTER — Inpatient Hospital Stay (HOSPITAL_BASED_OUTPATIENT_CLINIC_OR_DEPARTMENT_OTHER): Payer: Medicare Other | Admitting: Nurse Practitioner

## 2020-05-18 ENCOUNTER — Inpatient Hospital Stay (HOSPITAL_COMMUNITY): Payer: Medicare Other | Attending: Hematology

## 2020-05-18 ENCOUNTER — Inpatient Hospital Stay (HOSPITAL_COMMUNITY): Payer: Medicare Other

## 2020-05-18 DIAGNOSIS — D696 Thrombocytopenia, unspecified: Secondary | ICD-10-CM

## 2020-05-18 DIAGNOSIS — D649 Anemia, unspecified: Secondary | ICD-10-CM

## 2020-05-18 DIAGNOSIS — D7589 Other specified diseases of blood and blood-forming organs: Secondary | ICD-10-CM | POA: Diagnosis not present

## 2020-05-18 DIAGNOSIS — F1020 Alcohol dependence, uncomplicated: Secondary | ICD-10-CM | POA: Insufficient documentation

## 2020-05-18 DIAGNOSIS — N183 Chronic kidney disease, stage 3 unspecified: Secondary | ICD-10-CM | POA: Insufficient documentation

## 2020-05-18 DIAGNOSIS — D638 Anemia in other chronic diseases classified elsewhere: Secondary | ICD-10-CM | POA: Diagnosis present

## 2020-05-18 LAB — CBC WITH DIFFERENTIAL/PLATELET
Abs Immature Granulocytes: 0.02 10*3/uL (ref 0.00–0.07)
Basophils Absolute: 0 10*3/uL (ref 0.0–0.1)
Basophils Relative: 1 %
Eosinophils Absolute: 0.2 10*3/uL (ref 0.0–0.5)
Eosinophils Relative: 4 %
HCT: 28.5 % — ABNORMAL LOW (ref 39.0–52.0)
Hemoglobin: 8.8 g/dL — ABNORMAL LOW (ref 13.0–17.0)
Immature Granulocytes: 1 %
Lymphocytes Relative: 17 %
Lymphs Abs: 0.8 10*3/uL (ref 0.7–4.0)
MCH: 29.7 pg (ref 26.0–34.0)
MCHC: 30.9 g/dL (ref 30.0–36.0)
MCV: 96.3 fL (ref 80.0–100.0)
Monocytes Absolute: 0.6 10*3/uL (ref 0.1–1.0)
Monocytes Relative: 13 %
Neutro Abs: 2.8 10*3/uL (ref 1.7–7.7)
Neutrophils Relative %: 64 %
Platelets: 184 10*3/uL (ref 150–400)
RBC: 2.96 MIL/uL — ABNORMAL LOW (ref 4.22–5.81)
RDW: 20.4 % — ABNORMAL HIGH (ref 11.5–15.5)
WBC: 4.4 10*3/uL (ref 4.0–10.5)
nRBC: 0 % (ref 0.0–0.2)

## 2020-05-18 LAB — IRON AND TIBC
Iron: 19 ug/dL — ABNORMAL LOW (ref 45–182)
Saturation Ratios: 8 % — ABNORMAL LOW (ref 17.9–39.5)
TIBC: 237 ug/dL — ABNORMAL LOW (ref 250–450)
UIBC: 218 ug/dL

## 2020-05-18 LAB — COMPREHENSIVE METABOLIC PANEL
ALT: 11 U/L (ref 0–44)
AST: 14 U/L — ABNORMAL LOW (ref 15–41)
Albumin: 3.7 g/dL (ref 3.5–5.0)
Alkaline Phosphatase: 53 U/L (ref 38–126)
Anion gap: 11 (ref 5–15)
BUN: 12 mg/dL (ref 8–23)
CO2: 29 mmol/L (ref 22–32)
Calcium: 8.7 mg/dL — ABNORMAL LOW (ref 8.9–10.3)
Chloride: 102 mmol/L (ref 98–111)
Creatinine, Ser: 0.94 mg/dL (ref 0.61–1.24)
GFR calc Af Amer: >60 mL/min (ref >60)
GFR calc non Af Amer: >60 mL/min (ref >60)
Glucose, Bld: 95 mg/dL (ref 70–99)
Potassium: 3.4 mmol/L — ABNORMAL LOW (ref 3.5–5.1)
Sodium: 142 mmol/L (ref 135–145)
Total Bilirubin: 0.7 mg/dL (ref 0.3–1.2)
Total Protein: 6.6 g/dL (ref 6.5–8.1)

## 2020-05-18 LAB — LACTATE DEHYDROGENASE: LDH: 117 U/L (ref 98–192)

## 2020-05-18 LAB — FOLATE: Folate: 15.2 ng/mL (ref >5.9)

## 2020-05-18 LAB — VITAMIN B12: Vitamin B-12: 306 pg/mL (ref 180–914)

## 2020-05-18 LAB — VITAMIN D 25 HYDROXY (VIT D DEFICIENCY, FRACTURES): Vit D, 25-Hydroxy: 60.01 ng/mL (ref 30–100)

## 2020-05-18 LAB — FERRITIN: Ferritin: 500 ng/mL — ABNORMAL HIGH (ref 24–336)

## 2020-05-18 MED ORDER — EPOETIN ALFA-EPBX 40000 UNIT/ML IJ SOLN
40000.0000 [IU] | Freq: Once | INTRAMUSCULAR | Status: AC
  Administered 2020-05-18: 40000 [IU] via SUBCUTANEOUS
  Filled 2020-05-18: qty 1

## 2020-05-18 NOTE — Patient Instructions (Signed)
[place] at [facility] Discharge Instructions  Follow up every 2 weeks for labs and injections.    Thank you for choosing [place] at [facility] to provide your oncology and hematology care.  To afford each patient quality time with our provider, please arrive at least 15 minutes before your scheduled appointment time.   If you have a lab appointment with the [facility] please come in thru the Main Entrance and check in at the main information desk.  You need to re-schedule your appointment should you arrive 10 or more minutes late.  We strive to give you quality time with our providers, and arriving late affects you and other patients whose appointments are after yours.  Also, if you no show three or more times for appointments you may be dismissed from the clinic at the providers discretion.     Again, thank you for choosing [facility].  Our hope is that these requests will decrease the amount of time that you wait before being seen by our physicians.       _____________________________________________________________  Should you have questions after your visit to [facility], please contact our office at [phone] between the hours of 8:00 a.m. and 4:30 p.m.  Voicemails left after 4:00 p.m. will not be returned until the following business day.  For prescription refill requests, have your pharmacy contact our office and allow 72 hours.    Due to Covid, you will need to wear a mask upon entering the hospital. If you do not have a mask, a mask will be given to you at the Main Entrance upon arrival. For doctor visits, patients may have 1 support person with them. For treatment visits, patients can not have anyone with them due to social distancing guidelines and our immunocompromised population.

## 2020-05-18 NOTE — Progress Notes (Signed)
Roger Hansen, [place] [address]   CLINIC:  Medical Oncology/Hematology  PCP:  Curlene Labrum, MD [address] [place] [address] [phone]   REASON FOR VISIT: Follow-up for anemia   CURRENT THERAPY: Procrit injections every 2 weeks   INTERVAL HISTORY:  Roger Hansen 74 y.o. male returns for routine follow-up for anemia.  Patient reports he has been feeling really good the past few weeks.  He has been coming every 2 weeks for his injection and he is starting to feel little better.  He denies any bleeding per rectum or melena. Denies any nausea, vomiting, or diarrhea. Denies any new pains. Had not noticed any recent bleeding such as epistaxis, hematuria or hematochezia. Denies recent chest pain on exertion, shortness of breath on minimal exertion, pre-syncopal episodes, or palpitations. Denies any numbness or tingling in hands or feet. Denies any recent fevers, infections, or recent hospitalizations. Patient reports appetite at 100% and energy level at 100%.  He is eating well maintain his weight this time.      REVIEW OF SYSTEMS:  Review of Systems  Gastrointestinal: Positive for constipation.  Neurological: Positive for headaches and numbness.  Psychiatric/Behavioral: Positive for sleep disturbance.  All other systems reviewed and are negative.    PAST MEDICAL/SURGICAL HISTORY:  Past Medical History:  Diagnosis Date  . Alcohol abuse 11/24/2016  . Anemia 10/11/2013  . Chronic renal disease, stage 3, moderately decreased glomerular filtration rate (GFR) between 30-59 mL/min/1.73 square meter 12/02/2016  . Colon polyps   . GERD (gastroesophageal reflux disease)   . High cholesterol   . Hypertension   . Sinus infection    Past Surgical History:  Procedure Laterality Date  . BIOPSY  06/03/2019   Procedure: BIOPSY;  Surgeon: Rogene Houston, MD;  [facility];  Service: Endoscopy;;  esophagus  . COLONOSCOPY N/A 10/26/2013   Procedure: COLONOSCOPY;  Surgeon: Rogene Houston, MD;  [facility];  Service: Endoscopy;  Laterality: N/A;  325-moved to 1230 Ann to notify pt  . Colonoscopy with polypectomy    . COLONOSCOPY WITH PROPOFOL N/A 06/03/2019   Procedure: COLONOSCOPY WITH PROPOFOL;  Surgeon: Rogene Houston, MD;  [facility];  Service: Endoscopy;  Laterality: N/A;  . ESOPHAGOGASTRODUODENOSCOPY (EGD) WITH PROPOFOL N/A 06/03/2019   Procedure: ESOPHAGOGASTRODUODENOSCOPY (EGD) WITH PROPOFOL;  Surgeon: Rogene Houston, MD;  [facility];  Service: Endoscopy;  Laterality: N/A;  . POLYPECTOMY  06/03/2019   Procedure: POLYPECTOMY;  Surgeon: Rogene Houston, MD;  [facility];  Service: Endoscopy;;  colon     SOCIAL HISTORY:  Social History   Socioeconomic History  . Marital status: Married    Spouse name: Not on file  . Number of children: Not on file  . Years of education: Not on file  . Highest education level: Not on file  Occupational History  . Not on file  Tobacco Use  . Smoking status: Former Smoker    Packs/day: 0.50    Years: 20.00    Pack years: 10.00    Quit date: 12/27/1971    Years since quitting: 48.4  . Smokeless tobacco: Never Used  Substance and Sexual Activity  . Alcohol use: Yes    Comment: 1 pint of etoh a day ( not recently)  . Drug use: No  . Sexual activity: Not on file  Other Topics Concern  . Not on file  Social History Narrative  .  Not on file   Social Determinants of Health   Financial Resource Strain:   . Difficulty of Paying Living Expenses:   Food Insecurity:   . Worried About Charity fundraiser in the Last Year:   . Arboriculturist in the Last Year:   Transportation Needs:   . Film/video editor (Medical):   Marland Kitchen Lack of Transportation (Non-Medical):   Physical Activity:   . Days of Exercise per Week:   . Minutes of Exercise per Session:   Stress:   . Feeling of Stress :   Social Connections:   . Frequency of  Communication with Friends and Family:   . Frequency of Social Gatherings with Friends and Family:   . Attends Religious Services:   . Active Member of Clubs or Organizations:   . Attends Archivist Meetings:   Marland Kitchen Marital Status:   Intimate Partner Violence:   . Fear of Current or Ex-Partner:   . Emotionally Abused:   Marland Kitchen Physically Abused:   . Sexually Abused:     FAMILY HISTORY:  Family History  Problem Relation Age of Onset  . Hypertension Mother   . Colon cancer Neg Hx     CURRENT MEDICATIONS:  Outpatient Encounter Medications as of 05/18/2020  Medication Sig Note  . aspirin 81 MG tablet Take 1 tablet (81 mg total) by mouth daily.   Marland Kitchen atorvastatin (LIPITOR) 10 MG tablet Take 10 mg by mouth daily.   Marland Kitchen buPROPion (WELLBUTRIN SR) 150 MG 12 hr tablet Take 150 mg by mouth daily.    . busPIRone (BUSPAR) 30 MG tablet Take 30 mg by mouth 3 (three) times daily.   . cholecalciferol (VITAMIN D) 1000 UNITS tablet Take 1,000 Units by mouth 2 (two) times daily.   . Eszopiclone 3 MG TABS Take 3 mg by mouth at bedtime.    . Ferrous Sulfate (IRON) 325 (65 Fe) MG TABS TAKE ONE TABLET BY MOUTH DAILY WITH BREAKFAST. (Patient taking differently: 1 tablet 2 (two) times a day. )   . folic acid (FOLVITE) 1 MG tablet Take 1 mg by mouth daily.   . furosemide (LASIX) 20 MG tablet Take 20 mg by mouth daily.    Marland Kitchen gabapentin (NEURONTIN) 300 MG capsule Take 300 mg by mouth 3 (three) times daily as needed. 10/21/2016: Received from: External Pharmacy  . GNP VITAMIN B-1 100 MG tablet Take 100 mg by mouth daily.    Marland Kitchen losartan (COZAAR) 50 MG tablet Take 50 mg by mouth daily.   Marland Kitchen omeprazole (PRILOSEC) 20 MG capsule Take 20 mg by mouth daily as needed (Acid Reflux).   . potassium chloride SA (K-DUR) 20 MEQ tablet Take 20 mEq by mouth daily.    . traZODone (DESYREL) 100 MG tablet Take 100 mg by mouth at bedtime.   . vitamin B-12 (CYANOCOBALAMIN) 1000 MCG tablet Take 1,000 mcg by mouth daily.   . ziprasidone  (GEODON) 40 MG capsule Take 40 mg by mouth 2 (two) times daily with a meal.     No facility-administered encounter medications on file as of 05/18/2020.    ALLERGIES:  Allergies  Allergen Reactions  . Horseradish [Armoracia Rusticana Ext (Horseradish)] Anaphylaxis     PHYSICAL EXAM:  ECOG Performance status: 1  Vitals:   05/18/20 1022  BP: (!) 161/88  Pulse: 81  Resp: 18  Temp: (!) 97.3 F (36.3 C)  SpO2: 100%   Filed Weights   05/18/20 1022  Weight: 178 lb 12.8 oz (  81.1 kg)   Physical Exam Constitutional:      Appearance: Normal appearance. He is normal weight.  Cardiovascular:     Rate and Rhythm: Normal rate and regular rhythm.     Heart sounds: Normal heart sounds.  Pulmonary:     Effort: Pulmonary effort is normal.     Breath sounds: Normal breath sounds.  Abdominal:     General: Bowel sounds are normal.     Palpations: Abdomen is soft.  Musculoskeletal:        General: Normal range of motion.  Skin:    General: Skin is warm.  Neurological:     Mental Status: He is alert and oriented to person, place, and time. Mental status is at baseline.  Psychiatric:        Mood and Affect: Mood normal.        Behavior: Behavior normal.        Thought Content: Thought content normal.        Judgment: Judgment normal.      LABORATORY DATA:  I have reviewed the labs as listed.  CBC    Component Value Date/Time   WBC 4.4 05/18/2020 0918   RBC 2.96 (L) 05/18/2020 0918   HGB 8.8 (L) 05/18/2020 0918   HCT 28.5 (L) 05/18/2020 0918   HCT 27.9 (L) 08/13/2016 0944   PLT 184 05/18/2020 0918   MCV 96.3 05/18/2020 0918   MCH 29.7 05/18/2020 0918   MCHC 30.9 05/18/2020 0918   RDW 20.4 (H) 05/18/2020 0918   LYMPHSABS 0.8 05/18/2020 0918   MONOABS 0.6 05/18/2020 0918   EOSABS 0.2 05/18/2020 0918   BASOSABS 0.0 05/18/2020 0918   CMP Latest Ref Rng & Units 05/18/2020 05/01/2020 04/06/2020  Glucose 70 - 99 mg/dL 95 105(H) 84  BUN 8 - 23 mg/dL 12 9 20   Creatinine 0.61 -  1.24 mg/dL 0.94 0.87 1.49(H)  Sodium 135 - 145 mmol/L 142 139 138  Potassium 3.5 - 5.1 mmol/L 3.4(L) 3.4(L) 4.0  Chloride 98 - 111 mmol/L 102 95(L) 101  CO2 22 - 32 mmol/L 29 31 26   Calcium 8.9 - 10.3 mg/dL 8.7(L) 9.1 8.5(L)  Total Protein 6.5 - 8.1 g/dL 6.6 6.4(L) 6.8  Total Bilirubin 0.3 - 1.2 mg/dL 0.7 0.8 0.5  Alkaline Phos 38 - 126 U/L 53 72 56  AST 15 - 41 U/L 14(L) 22 13(L)  ALT 0 - 44 U/L 11 11 11     All questions were answered to patient's stated satisfaction. Encouraged patient to call with any new concerns or questions before his next visit to the cancer center and we can certain see him sooner, if needed.     ASSESSMENT & PLAN:  Anemia 1.  Normocytic normochromic anemia: - It is felt that his chronic alcohol abuse is felt to be the contributing factor to his anemia given its toxicity on the bone marrow. - His anemia dates back to at least 2014 with a negative peripheral work-up, negative bone marrow aspiration and biopsy and cytogenetics on 10/16/2016 in the setting of alcohol abuse.  The bone marrow showed slightly hypercellular marrow with trilineage hematopoiesis.  Chromosome analysis was normal. -Colonoscopy on 06/03/2019 showed 4 tubular adenomas and external hemorrhoids.  EGD showed normal proximal and distal esophagus.  Normal stomach.  Duodenitis.  Grade a esophagitis. -Last Feraheme infusion was on 05/26/2019 and 06/02/2019. -It was reported that he was on iron tablets in the past and tolerated fairly well.  He was placed back on oral iron.  He is taking without problems. - He is a Jehovah witness and does not receive blood products.  He does except platelets but his platelets are good at 318. -He is now on Retacrit 30,000 units every 2 weeks.  He missed injections in October and December.  Also missed injections at the end of January and all of February. -We have increased his Retacrit to 40,000 units every 2 weeks. -He last received an iron infusion on 05/02/2020.  He  will reschedule his second dose soon. -Patient is still wanting to go to every 3 weeks however his hemoglobins are staying in the eights nines. -We will continue every 2-week injections at this time. -Labs done on 05/18/2020 showed hemoglobin 8.8. - He will follow-up in 6 weeks with repeat labs.  2.  EtOH abuse/depression: - It has been previously discussed several times regarding AA. -He reportedly had PTSD since he was in [place].   -He follows up with Dr. Langston Masker in [facility].      Orders placed this encounter:  Orders Placed This Encounter  Procedures  . Lactate dehydrogenase  . CBC with Differential/Platelet  . Comprehensive metabolic panel  . Ferritin  . Iron and TIBC  . Vitamin B12  . VITAMIN D 25 Hydroxy (Vit-D Deficiency, Fractures)      Francene Finders, FNP-C [place] [phone]

## 2020-05-18 NOTE — Assessment & Plan Note (Signed)
1.  Normocytic normochromic anemia: - It is felt that his chronic alcohol abuse is felt to be the contributing factor to his anemia given its toxicity on the bone marrow. - His anemia dates back to at least 2014 with a negative peripheral work-up, negative bone marrow aspiration and biopsy and cytogenetics on 10/16/2016 in the setting of alcohol abuse.  The bone marrow showed slightly hypercellular marrow with trilineage hematopoiesis.  Chromosome analysis was normal. -Colonoscopy on 06/03/2019 showed 4 tubular adenomas and external hemorrhoids.  EGD showed normal proximal and distal esophagus.  Normal stomach.  Duodenitis.  Grade a esophagitis. -Last Feraheme infusion was on 05/26/2019 and 06/02/2019. -It was reported that he was on iron tablets in the past and tolerated fairly well.  He was placed back on oral iron.  He is taking without problems. - He is a Jehovah witness and does not receive blood products.  He does except platelets but his platelets are good at 318. -He is now on Retacrit 30,000 units every 2 weeks.  He missed injections in October and December.  Also missed injections at the end of January and all of February. -We have increased his Retacrit to 40,000 units every 2 weeks. -He last received an iron infusion on 05/02/2020.  He will reschedule his second dose soon. -Patient is still wanting to go to every 3 weeks however his hemoglobins are staying in the eights nines. -We will continue every 2-week injections at this time. -Labs done on 05/18/2020 showed hemoglobin 8.8. - He will follow-up in 6 weeks with repeat labs.  2.  EtOH abuse/depression: - It has been previously discussed several times regarding AA. -He reportedly had PTSD since he was in [place].   -He follows up with Dr. Langston Masker in [facility].

## 2020-05-29 ENCOUNTER — Inpatient Hospital Stay (HOSPITAL_COMMUNITY): Payer: Medicare Other

## 2020-05-29 ENCOUNTER — Other Ambulatory Visit: Payer: Self-pay

## 2020-05-29 ENCOUNTER — Encounter (HOSPITAL_COMMUNITY): Payer: Self-pay

## 2020-05-29 VITALS — BP 125/76 | HR 70 | Temp 97.6°F | Resp 18

## 2020-05-29 DIAGNOSIS — N183 Chronic kidney disease, stage 3 unspecified: Secondary | ICD-10-CM

## 2020-05-29 DIAGNOSIS — D649 Anemia, unspecified: Secondary | ICD-10-CM | POA: Diagnosis not present

## 2020-05-29 DIAGNOSIS — D7589 Other specified diseases of blood and blood-forming organs: Secondary | ICD-10-CM | POA: Diagnosis not present

## 2020-05-29 MED ORDER — SODIUM CHLORIDE 0.9 % IV SOLN
510.0000 mg | Freq: Once | INTRAVENOUS | Status: AC
  Administered 2020-05-29: 510 mg via INTRAVENOUS
  Filled 2020-05-29: qty 17

## 2020-05-29 MED ORDER — SODIUM CHLORIDE 0.9 % IV SOLN: Freq: Once | INTRAVENOUS | Status: AC

## 2020-05-29 NOTE — Patient Instructions (Signed)
[facility]  Discharge Instructions:   _______________________________________________________________  Thank you for choosing [facility] to provide your oncology and hematology care.  To afford each patient quality time with our providers, please arrive at least 15 minutes before your scheduled appointment.  You need to re-schedule your appointment if you arrive 10 or more minutes late.  We strive to give you quality time with our providers, and arriving late affects you and other patients whose appointments are after yours.  Also, if you no show three or more times for appointments you may be dismissed from the clinic.  Again, thank you for choosing [facility]. Our hope is that these requests will allow you access to exceptional care and in a timely manner. _______________________________________________________________  If you have questions after your visit, please contact our office at [phone] between the hours of 8:30 a.m. and 5:00 p.m. Voicemails left after 4:30 p.m. will not be returned until the following business day. _______________________________________________________________  For prescription refill requests, have your pharmacy contact our office. _______________________________________________________________  Recommendations made by the consultant and any test results will be sent to your referring physician. _______________________________________________________________ 

## 2020-05-29 NOTE — Progress Notes (Signed)
Patient presents today for Feraheme infusion. Vital signs are stable. Patient has no complaints of any pain today. Patient denies any changes since the last visit.   Treatment given today per MD orders. Tolerated infusion without adverse affects. Vital signs stable. No complaints at this time. Discharged from clinic ambulatory. F/U with [facility] as scheduled.

## 2020-05-31 ENCOUNTER — Other Ambulatory Visit (HOSPITAL_COMMUNITY): Payer: Self-pay | Admitting: *Deleted

## 2020-05-31 DIAGNOSIS — D649 Anemia, unspecified: Secondary | ICD-10-CM

## 2020-05-31 DIAGNOSIS — N183 Chronic kidney disease, stage 3 unspecified: Secondary | ICD-10-CM

## 2020-06-01 ENCOUNTER — Inpatient Hospital Stay (HOSPITAL_COMMUNITY): Payer: Medicare Other

## 2020-06-01 ENCOUNTER — Encounter (HOSPITAL_COMMUNITY): Payer: Self-pay

## 2020-06-01 ENCOUNTER — Other Ambulatory Visit: Payer: Self-pay

## 2020-06-01 VITALS — BP 102/54 | HR 65 | Temp 97.7°F | Resp 18

## 2020-06-01 DIAGNOSIS — N183 Chronic kidney disease, stage 3 unspecified: Secondary | ICD-10-CM | POA: Diagnosis not present

## 2020-06-01 DIAGNOSIS — D649 Anemia, unspecified: Secondary | ICD-10-CM | POA: Diagnosis not present

## 2020-06-01 DIAGNOSIS — D7589 Other specified diseases of blood and blood-forming organs: Secondary | ICD-10-CM | POA: Diagnosis not present

## 2020-06-01 LAB — CBC
HCT: 27 % — ABNORMAL LOW (ref 39.0–52.0)
Hemoglobin: 8.8 g/dL — ABNORMAL LOW (ref 13.0–17.0)
MCH: 30.1 pg (ref 26.0–34.0)
MCHC: 32.6 g/dL (ref 30.0–36.0)
MCV: 92.5 fL (ref 80.0–100.0)
Platelets: 148 10*3/uL — ABNORMAL LOW (ref 150–400)
RBC: 2.92 MIL/uL — ABNORMAL LOW (ref 4.22–5.81)
RDW: 20.9 % — ABNORMAL HIGH (ref 11.5–15.5)
WBC: 4.5 10*3/uL (ref 4.0–10.5)
nRBC: 0 % (ref 0.0–0.2)

## 2020-06-01 MED ORDER — EPOETIN ALFA-EPBX 40000 UNIT/ML IJ SOLN
40000.0000 [IU] | Freq: Once | INTRAMUSCULAR | Status: AC
  Administered 2020-06-01: 40000 [IU] via SUBCUTANEOUS
  Filled 2020-06-01: qty 1

## 2020-06-01 NOTE — Progress Notes (Signed)
Pt here today for Retacrit injection. Pt tolerated injection well with no complaints. Pt stable and discharged home ambulatory. Pt to return as scheduled.

## 2020-06-04 DIAGNOSIS — R195 Other fecal abnormalities: Secondary | ICD-10-CM | POA: Diagnosis not present

## 2020-06-14 ENCOUNTER — Other Ambulatory Visit (HOSPITAL_COMMUNITY): Payer: Self-pay | Admitting: *Deleted

## 2020-06-14 DIAGNOSIS — N183 Chronic kidney disease, stage 3 unspecified: Secondary | ICD-10-CM

## 2020-06-14 DIAGNOSIS — D649 Anemia, unspecified: Secondary | ICD-10-CM

## 2020-06-14 DIAGNOSIS — N2889 Other specified disorders of kidney and ureter: Secondary | ICD-10-CM

## 2020-06-15 ENCOUNTER — Inpatient Hospital Stay (HOSPITAL_COMMUNITY): Payer: Medicare Other | Attending: Hematology

## 2020-06-15 ENCOUNTER — Inpatient Hospital Stay (HOSPITAL_COMMUNITY): Payer: Medicare Other

## 2020-06-15 DIAGNOSIS — N183 Chronic kidney disease, stage 3 unspecified: Secondary | ICD-10-CM | POA: Insufficient documentation

## 2020-06-15 DIAGNOSIS — D631 Anemia in chronic kidney disease: Secondary | ICD-10-CM | POA: Insufficient documentation

## 2020-06-22 ENCOUNTER — Inpatient Hospital Stay (HOSPITAL_COMMUNITY): Payer: Medicare Other

## 2020-06-22 ENCOUNTER — Other Ambulatory Visit: Payer: Self-pay

## 2020-06-22 ENCOUNTER — Encounter (HOSPITAL_COMMUNITY): Payer: Self-pay

## 2020-06-22 ENCOUNTER — Inpatient Hospital Stay (HOSPITAL_COMMUNITY): Payer: Medicare Other | Attending: Hematology

## 2020-06-22 VITALS — BP 148/80 | HR 73 | Temp 98.7°F | Resp 18

## 2020-06-22 DIAGNOSIS — D649 Anemia, unspecified: Secondary | ICD-10-CM

## 2020-06-22 DIAGNOSIS — N183 Chronic kidney disease, stage 3 unspecified: Secondary | ICD-10-CM | POA: Diagnosis not present

## 2020-06-22 DIAGNOSIS — N2889 Other specified disorders of kidney and ureter: Secondary | ICD-10-CM

## 2020-06-22 DIAGNOSIS — D631 Anemia in chronic kidney disease: Secondary | ICD-10-CM | POA: Diagnosis not present

## 2020-06-22 LAB — CBC
HCT: 26.9 % — ABNORMAL LOW (ref 39.0–52.0)
Hemoglobin: 8.3 g/dL — ABNORMAL LOW (ref 13.0–17.0)
MCH: 29.1 pg (ref 26.0–34.0)
MCHC: 30.9 g/dL (ref 30.0–36.0)
MCV: 94.4 fL (ref 80.0–100.0)
Platelets: 152 10*3/uL (ref 150–400)
RBC: 2.85 MIL/uL — ABNORMAL LOW (ref 4.22–5.81)
RDW: 19.2 % — ABNORMAL HIGH (ref 11.5–15.5)
WBC: 4.4 10*3/uL (ref 4.0–10.5)
nRBC: 0 % (ref 0.0–0.2)

## 2020-06-22 MED ORDER — EPOETIN ALFA-EPBX 40000 UNIT/ML IJ SOLN
40000.0000 [IU] | Freq: Once | INTRAMUSCULAR | Status: AC
  Administered 2020-06-22: 40000 [IU] via SUBCUTANEOUS
  Filled 2020-06-22: qty 1

## 2020-06-22 NOTE — Progress Notes (Signed)
Roger Hansen presents today for injection per the provider's orders.  Retacrit administration without incident; injection site WNL; see MAR for injection details.  Patient tolerated procedure well and without incident.  No questions or complaints noted at this time. Pt d/c clinic ambulatory.

## 2020-06-29 ENCOUNTER — Ambulatory Visit (HOSPITAL_COMMUNITY): Payer: Medicare Other | Admitting: Nurse Practitioner

## 2020-06-29 ENCOUNTER — Other Ambulatory Visit (HOSPITAL_COMMUNITY): Payer: Medicare Other

## 2020-06-29 ENCOUNTER — Ambulatory Visit (HOSPITAL_COMMUNITY): Payer: Medicare Other

## 2020-07-06 ENCOUNTER — Inpatient Hospital Stay (HOSPITAL_BASED_OUTPATIENT_CLINIC_OR_DEPARTMENT_OTHER): Payer: Medicare Other | Admitting: Nurse Practitioner

## 2020-07-06 ENCOUNTER — Other Ambulatory Visit: Payer: Self-pay

## 2020-07-06 ENCOUNTER — Inpatient Hospital Stay (HOSPITAL_COMMUNITY): Payer: Medicare Other

## 2020-07-06 DIAGNOSIS — D649 Anemia, unspecified: Secondary | ICD-10-CM

## 2020-07-06 DIAGNOSIS — N183 Chronic kidney disease, stage 3 unspecified: Secondary | ICD-10-CM

## 2020-07-06 DIAGNOSIS — D631 Anemia in chronic kidney disease: Secondary | ICD-10-CM | POA: Diagnosis not present

## 2020-07-06 LAB — COMPREHENSIVE METABOLIC PANEL
ALT: 9 U/L (ref 0–44)
AST: 14 U/L — ABNORMAL LOW (ref 15–41)
Albumin: 4.1 g/dL (ref 3.5–5.0)
Alkaline Phosphatase: 57 U/L (ref 38–126)
Anion gap: 8 (ref 5–15)
BUN: 21 mg/dL (ref 8–23)
CO2: 25 mmol/L (ref 22–32)
Calcium: 9 mg/dL (ref 8.9–10.3)
Chloride: 105 mmol/L (ref 98–111)
Creatinine, Ser: 1.53 mg/dL — ABNORMAL HIGH (ref 0.61–1.24)
GFR calc Af Amer: 52 mL/min — ABNORMAL LOW (ref >60)
GFR calc non Af Amer: 45 mL/min — ABNORMAL LOW (ref >60)
Glucose, Bld: 87 mg/dL (ref 70–99)
Potassium: 4.3 mmol/L (ref 3.5–5.1)
Sodium: 138 mmol/L (ref 135–145)
Total Bilirubin: 0.5 mg/dL (ref 0.3–1.2)
Total Protein: 7.2 g/dL (ref 6.5–8.1)

## 2020-07-06 LAB — CBC WITH DIFFERENTIAL/PLATELET
Abs Immature Granulocytes: 0.01 10*3/uL (ref 0.00–0.07)
Basophils Absolute: 0 10*3/uL (ref 0.0–0.1)
Basophils Relative: 1 %
Eosinophils Absolute: 0.2 10*3/uL (ref 0.0–0.5)
Eosinophils Relative: 3 %
HCT: 33.7 % — ABNORMAL LOW (ref 39.0–52.0)
Hemoglobin: 10.1 g/dL — ABNORMAL LOW (ref 13.0–17.0)
Immature Granulocytes: 0 %
Lymphocytes Relative: 22 %
Lymphs Abs: 1.4 10*3/uL (ref 0.7–4.0)
MCH: 28.1 pg (ref 26.0–34.0)
MCHC: 30 g/dL (ref 30.0–36.0)
MCV: 93.9 fL (ref 80.0–100.0)
Monocytes Absolute: 0.5 10*3/uL (ref 0.1–1.0)
Monocytes Relative: 8 %
Neutro Abs: 4.1 10*3/uL (ref 1.7–7.7)
Neutrophils Relative %: 66 %
Platelets: 301 10*3/uL (ref 150–400)
RBC: 3.59 MIL/uL — ABNORMAL LOW (ref 4.22–5.81)
RDW: 18 % — ABNORMAL HIGH (ref 11.5–15.5)
WBC: 6.1 10*3/uL (ref 4.0–10.5)
nRBC: 0 % (ref 0.0–0.2)

## 2020-07-06 LAB — IRON AND TIBC
Iron: 39 ug/dL — ABNORMAL LOW (ref 45–182)
Saturation Ratios: 16 % — ABNORMAL LOW (ref 17.9–39.5)
TIBC: 250 ug/dL (ref 250–450)
UIBC: 211 ug/dL

## 2020-07-06 LAB — FERRITIN: Ferritin: 529 ng/mL — ABNORMAL HIGH (ref 24–336)

## 2020-07-06 LAB — LACTATE DEHYDROGENASE: LDH: 119 U/L (ref 98–192)

## 2020-07-06 LAB — VITAMIN B12: Vitamin B-12: 597 pg/mL (ref 180–914)

## 2020-07-06 LAB — VITAMIN D 25 HYDROXY (VIT D DEFICIENCY, FRACTURES): Vit D, 25-Hydroxy: 56.28 ng/mL (ref 30–100)

## 2020-07-06 MED ORDER — EPOETIN ALFA-EPBX 40000 UNIT/ML IJ SOLN
40000.0000 [IU] | Freq: Once | INTRAMUSCULAR | Status: AC
  Administered 2020-07-06: 40000 [IU] via SUBCUTANEOUS
  Filled 2020-07-06: qty 1

## 2020-07-06 NOTE — Assessment & Plan Note (Signed)
1.  Normocytic normochromic anemia: - It is felt that his chronic alcohol abuse is felt to be the contributing factor to his anemia given its toxicity on the bone marrow. - His anemia dates back to at least 2014 with a negative peripheral work-up, negative bone marrow aspiration and biopsy and cytogenetics on 10/16/2016 in the setting of alcohol abuse.  The bone marrow showed slightly hypercellular marrow with trilineage hematopoiesis.  Chromosome analysis was normal. -Colonoscopy on 06/03/2019 showed 4 tubular adenomas and external hemorrhoids.  EGD showed normal proximal and distal esophagus.  Normal stomach.  Duodenitis.  Grade a esophagitis. -Last Feraheme infusion was on 05/26/2019 and 06/02/2019. -It was reported that he was on iron tablets in the past and tolerated fairly well.  He was placed back on oral iron.  He is taking without problems. - He is a Jehovah witness and does not receive blood products.  He does accept platelets but his platelets are good at 301. -He is now on Retacrit 30,000 units every 2 weeks.  He missed injections in October and December.  Also missed injections at the end of January and all of February. -We have increased his Retacrit to 40,000 units every 2 weeks. -He last received an iron infusion on 05/02/2020.   -Patient is still wanting to go to every 3 weeks however his hemoglobins are staying in the eights nines. -We will start every 3 week injection  -Labs done on 07/06/2020 showed hemoglobin 10.1 - He will follow-up in 6 weeks with repeat labs.  2.  EtOH abuse/depression: - It has been previously discussed several times regarding AA. -He reportedly had PTSD since he was in [place].   -He follows up with Dr. Langston Masker in [facility].

## 2020-07-06 NOTE — Progress Notes (Signed)
[place], [place] [id number]   CLINIC:  Medical Oncology/Hematology  PCP:  Curlene Labrum, MD [address] [place] [address] [phone]   REASON FOR VISIT: Follow-up for anemia   CURRENT THERAPY: Retacrit injections every 3 weeks.   INTERVAL HISTORY:  [name] 74 y.o. male returns for routine follow-up for anemia.  Patient reports he is doing well since last visit.  He denies any bright red bleeding per rectum or melena.  Denies any easy bruising or bleeding. Denies any nausea, vomiting, or diarrhea. Denies any new pains. Had not noticed any recent bleeding such as epistaxis, hematuria or hematochezia. Denies recent chest pain on exertion, shortness of breath on minimal exertion, pre-syncopal episodes, or palpitations. Denies any numbness or tingling in hands or feet. Denies any recent fevers, infections, or recent hospitalizations. Patient reports appetite at 75% and energy level at 50%.    REVIEW OF SYSTEMS:  Review of Systems  Neurological: Positive for dizziness and numbness.  Psychiatric/Behavioral: Positive for depression and sleep disturbance. The patient is nervous/anxious.   All other systems reviewed and are negative.    PAST MEDICAL/SURGICAL HISTORY:  Past Medical History:  Diagnosis Date  . Alcohol abuse 11/24/2016  . Anemia 10/11/2013  . Chronic renal disease, stage 3, moderately decreased glomerular filtration rate (GFR) between 30-59 mL/min/1.73 square meter 12/02/2016  . Colon polyps   . GERD (gastroesophageal reflux disease)   . High cholesterol   . Hypertension   . Sinus infection    Past Surgical History:  Procedure Laterality Date  . BIOPSY  06/03/2019   Procedure: BIOPSY;  Surgeon: Rogene Houston, MD;  [facility];  Service: Endoscopy;;  esophagus  . COLONOSCOPY N/A 10/26/2013   Procedure: COLONOSCOPY;  Surgeon: Rogene Houston, MD;  [facility];  Service: Endoscopy;   Laterality: N/A;  325-moved to 1230 Ann to notify pt  . Colonoscopy with polypectomy    . COLONOSCOPY WITH PROPOFOL N/A 06/03/2019   Procedure: COLONOSCOPY WITH PROPOFOL;  Surgeon: Rogene Houston, MD;  [facility];  Service: Endoscopy;  Laterality: N/A;  . ESOPHAGOGASTRODUODENOSCOPY (EGD) WITH PROPOFOL N/A 06/03/2019   Procedure: ESOPHAGOGASTRODUODENOSCOPY (EGD) WITH PROPOFOL;  Surgeon: Rogene Houston, MD;  [facility];  Service: Endoscopy;  Laterality: N/A;  . POLYPECTOMY  06/03/2019   Procedure: POLYPECTOMY;  Surgeon: Rogene Houston, MD;  [facility];  Service: Endoscopy;;  colon     SOCIAL HISTORY:  Social History   Socioeconomic History  . Marital status: Married    Spouse name: Not on file  . Number of children: Not on file  . Years of education: Not on file  . Highest education level: Not on file  Occupational History  . Not on file  Tobacco Use  . Smoking status: Former Smoker    Packs/day: 0.50    Years: 20.00    Pack years: 10.00    Quit date: 12/27/1971    Years since quitting: 48.5  . Smokeless tobacco: Never Used  Substance and Sexual Activity  . Alcohol use: Yes    Comment: 1 pint of etoh a day ( not recently)  . Drug use: No  . Sexual activity: Not on file  Other Topics Concern  . Not on file  Social History Narrative  . Not on file   Social Determinants of Health   Financial Resource Strain:   . Difficulty of Paying Living  Expenses:   Food Insecurity:   . Worried About Charity fundraiser in the Last Year:   . Arboriculturist in the Last Year:   Transportation Needs:   . Film/video editor (Medical):   Marland Kitchen Lack of Transportation (Non-Medical):   Physical Activity:   . Days of Exercise per Week:   . Minutes of Exercise per Session:   Stress:   . Feeling of Stress :   Social Connections:   . Frequency of Communication with Friends and Family:   . Frequency of Social Gatherings with Friends and Family:   .  Attends Religious Services:   . Active Member of Clubs or Organizations:   . Attends Archivist Meetings:   Marland Kitchen Marital Status:   Intimate Partner Violence:   . Fear of Current or Ex-Partner:   . Emotionally Abused:   Marland Kitchen Physically Abused:   . Sexually Abused:     FAMILY HISTORY:  Family History  Problem Relation Age of Onset  . Hypertension Mother   . Colon cancer Neg Hx     CURRENT MEDICATIONS:  Outpatient Encounter Medications as of 07/06/2020  Medication Sig Note  . aspirin 81 MG tablet Take 1 tablet (81 mg total) by mouth daily.   Marland Kitchen atorvastatin (LIPITOR) 10 MG tablet Take 10 mg by mouth daily.   Marland Kitchen buPROPion (WELLBUTRIN SR) 150 MG 12 hr tablet Take 150 mg by mouth daily.    . busPIRone (BUSPAR) 30 MG tablet Take 30 mg by mouth 3 (three) times daily.   . cholecalciferol (VITAMIN D) 1000 UNITS tablet Take 1,000 Units by mouth 2 (two) times daily.   . Eszopiclone 3 MG TABS Take 3 mg by mouth at bedtime.    . Ferrous Sulfate (IRON) 325 (65 Fe) MG TABS TAKE ONE TABLET BY MOUTH DAILY WITH BREAKFAST. (Patient taking differently: 1 tablet 2 (two) times a day. )   . folic acid (FOLVITE) 1 MG tablet Take 1 mg by mouth daily.   . furosemide (LASIX) 20 MG tablet Take 20 mg by mouth daily.    Marland Kitchen gabapentin (NEURONTIN) 300 MG capsule Take 300 mg by mouth 3 (three) times daily as needed. 10/21/2016: Received from: External Pharmacy  . GNP VITAMIN B-1 100 MG tablet Take 100 mg by mouth daily.    Marland Kitchen losartan (COZAAR) 50 MG tablet Take 50 mg by mouth daily.   Marland Kitchen omeprazole (PRILOSEC) 20 MG capsule Take 20 mg by mouth daily as needed (Acid Reflux).   . potassium chloride SA (K-DUR) 20 MEQ tablet Take 20 mEq by mouth daily.    . traZODone (DESYREL) 100 MG tablet Take 100 mg by mouth at bedtime.   . vitamin B-12 (CYANOCOBALAMIN) 1000 MCG tablet Take 1,000 mcg by mouth daily.   . ziprasidone (GEODON) 40 MG capsule Take 40 mg by mouth 2 (two) times daily with a meal.     No  facility-administered encounter medications on file as of 07/06/2020.    ALLERGIES:  Allergies  Allergen Reactions  . Horseradish [Armoracia Rusticana Ext (Horseradish)] Anaphylaxis     PHYSICAL EXAM:  ECOG Performance status: 1  Vitals:   07/06/20 0945  BP: 124/72  Pulse: 74  Resp: 20  Temp: 98.3 F (36.8 C)  SpO2: 100%   Filed Weights   07/06/20 0945  Weight: 176 lb 14.4 oz (80.2 kg)   Physical Exam Constitutional:      Appearance: Normal appearance. He is normal weight.  Cardiovascular:  Rate and Rhythm: Normal rate and regular rhythm.     Heart sounds: Normal heart sounds.  Pulmonary:     Effort: Pulmonary effort is normal.     Breath sounds: Normal breath sounds.  Abdominal:     General: Bowel sounds are normal.     Palpations: Abdomen is soft.  Musculoskeletal:        General: Normal range of motion.  Skin:    General: Skin is warm.  Neurological:     Mental Status: He is alert and oriented to person, place, and time. Mental status is at baseline.  Psychiatric:        Mood and Affect: Mood normal.        Behavior: Behavior normal.        Thought Content: Thought content normal.        Judgment: Judgment normal.      LABORATORY DATA:  I have reviewed the labs as listed.  CBC    Component Value Date/Time   WBC 6.1 07/06/2020 0917   RBC 3.59 (L) 07/06/2020 0917   HGB 10.1 (L) 07/06/2020 0917   HCT 33.7 (L) 07/06/2020 0917   HCT 27.9 (L) 08/13/2016 0944   PLT 301 07/06/2020 0917   MCV 93.9 07/06/2020 0917   MCH 28.1 07/06/2020 0917   MCHC 30.0 07/06/2020 0917   RDW 18.0 (H) 07/06/2020 0917   LYMPHSABS 1.4 07/06/2020 0917   MONOABS 0.5 07/06/2020 0917   EOSABS 0.2 07/06/2020 0917   BASOSABS 0.0 07/06/2020 0917   CMP Latest Ref Rng & Units 07/06/2020 05/18/2020 05/01/2020  Glucose 70 - 99 mg/dL 87 95 105(H)  BUN 8 - 23 mg/dL 21 12 9   Creatinine 0.61 - 1.24 mg/dL 1.53(H) 0.94 0.87  Sodium 135 - 145 mmol/L 138 142 139  Potassium 3.5 - 5.1  mmol/L 4.3 3.4(L) 3.4(L)  Chloride 98 - 111 mmol/L 105 102 95(L)  CO2 22 - 32 mmol/L 25 29 31   Calcium 8.9 - 10.3 mg/dL 9.0 8.7(L) 9.1  Total Protein 6.5 - 8.1 g/dL 7.2 6.6 6.4(L)  Total Bilirubin 0.3 - 1.2 mg/dL 0.5 0.7 0.8  Alkaline Phos 38 - 126 U/L 57 53 72  AST 15 - 41 U/L 14(L) 14(L) 22  ALT 0 - 44 U/L 9 11 11     All questions were answered to patient's stated satisfaction. Encouraged patient to call with any new concerns or questions before his next visit to the cancer center and we can certain see him sooner, if needed.     ASSESSMENT & PLAN:  Anemia 1.  Normocytic normochromic anemia: - It is felt that his chronic alcohol abuse is felt to be the contributing factor to his anemia given its toxicity on the bone marrow. - His anemia dates back to at least 2014 with a negative peripheral work-up, negative bone marrow aspiration and biopsy and cytogenetics on 10/16/2016 in the setting of alcohol abuse.  The bone marrow showed slightly hypercellular marrow with trilineage hematopoiesis.  Chromosome analysis was normal. -Colonoscopy on 06/03/2019 showed 4 tubular adenomas and external hemorrhoids.  EGD showed normal proximal and distal esophagus.  Normal stomach.  Duodenitis.  Grade a esophagitis. -Last Feraheme infusion was on 05/26/2019 and 06/02/2019. -It was reported that he was on iron tablets in the past and tolerated fairly well.  He was placed back on oral iron.  He is taking without problems. - He is a Jehovah witness and does not receive blood products.  He does accept platelets but his  platelets are good at 301. -He is now on Retacrit 30,000 units every 2 weeks.  He missed injections in October and December.  Also missed injections at the end of January and all of February. -We have increased his Retacrit to 40,000 units every 2 weeks. -He last received an iron infusion on 05/02/2020.   -Patient is still wanting to go to every 3 weeks however his hemoglobins are staying in the  eights nines. -We will start every 3 week injection  -Labs done on 07/06/2020 showed hemoglobin 10.1 - He will follow-up in 6 weeks with repeat labs.  2.  EtOH abuse/depression: - It has been previously discussed several times regarding AA. -He reportedly had PTSD since he was in [place].   -He follows up with Dr. Langston Masker in [facility]      Orders placed this encounter:  Orders Placed This Encounter  Procedures  . Lactate dehydrogenase  . CBC with Differential/Platelet  . Comprehensive metabolic panel  . Ferritin  . Iron and TIBC  . Vitamin B12  . VITAMIN D 25 Hydroxy (Vit-D Deficiency, Fractures)  . Folate      Francene Finders, FNP-C [place] [phone]

## 2020-07-26 ENCOUNTER — Encounter: Payer: Self-pay | Admitting: Student

## 2020-07-26 ENCOUNTER — Ambulatory Visit (INDEPENDENT_AMBULATORY_CARE_PROVIDER_SITE_OTHER): Payer: Medicare Other | Admitting: Student

## 2020-07-26 ENCOUNTER — Other Ambulatory Visit: Payer: Self-pay

## 2020-07-26 VITALS — BP 106/70 | HR 88 | Ht 65.0 in | Wt 176.0 lb

## 2020-07-26 DIAGNOSIS — I10 Essential (primary) hypertension: Secondary | ICD-10-CM | POA: Diagnosis not present

## 2020-07-26 DIAGNOSIS — I429 Cardiomyopathy, unspecified: Secondary | ICD-10-CM

## 2020-07-26 DIAGNOSIS — E782 Mixed hyperlipidemia: Secondary | ICD-10-CM

## 2020-07-26 DIAGNOSIS — N183 Chronic kidney disease, stage 3 unspecified: Secondary | ICD-10-CM

## 2020-07-26 MED ORDER — NITROGLYCERIN 0.4 MG SL SUBL
0.4000 mg | SUBLINGUAL_TABLET | SUBLINGUAL | 3 refills | Status: DC | PRN
Start: 2020-07-26 — End: 2024-10-04

## 2020-07-26 NOTE — Patient Instructions (Signed)
Medication Instructions:  Start Nitroglycerin 0.4 mg every 5 minutes as needed for chest pain  *If you need a refill on your cardiac medications before your next appointment, please call your pharmacy*   Lab Work: None ordered   If you have labs (blood work) drawn today and your tests are completely normal, you will receive your results only by: Marland Kitchen MyChart Message (if you have MyChart) OR . A paper copy in the mail If you have any lab test that is abnormal or we need to change your treatment, we will call you to review the results.   Testing/Procedures: None ordered    Follow-Up: At [facility], you and your health needs are our priority.  As part of our continuing mission to provide you with exceptional heart care, we have created designated Provider Care Teams.  These Care Teams include your primary Cardiologist (physician) and Advanced Practice Providers (APPs -  Physician Assistants and Nurse Practitioners) who all work together to provide you with the care you need, when you need it.  We recommend signing up for the patient portal called "MyChart".  Sign up information is provided on this After Visit Summary.  MyChart is used to connect with patients for Virtual Visits (Telemedicine).  Patients are able to view lab/test results, encounter notes, upcoming appointments, etc.  Non-urgent messages can be sent to your provider as well.   To learn more about what you can do with MyChart, go to [email or web address].    Your next appointment:   12 month(s)  The format for your next appointment:   In Person  Provider:   You may see Rozann Lesches, MD  or one of the following Advanced Practice Providers on your designated Care Team:    Bernerd Pho, PA-C   Ermalinda Barrios, PA-C     Other Instructions Nitroglycerin sublingual tablets What is this medicine? NITROGLYCERIN (nye troe GLI ser in) is a type of vasodilator. It relaxes blood vessels, increasing the blood and  oxygen supply to your heart. This medicine is used to relieve chest pain caused by angina. It is also used to prevent chest pain before activities like climbing stairs, going outdoors in cold weather, or sexual activity. This medicine may be used for other purposes; ask your health care provider or pharmacist if you have questions. COMMON BRAND NAME(S): Nitroquick, Nitrostat, Nitrotab What should I tell my health care provider before I take this medicine? They need to know if you have any of these conditions:  anemia  head injury, recent stroke, or bleeding in the brain  liver disease  previous heart attack  an unusual or allergic reaction to nitroglycerin, other medicines, foods, dyes, or preservatives  pregnant or trying to get pregnant  breast-feeding How should I use this medicine? Take this medicine by mouth as needed. At the first sign of an angina attack (chest pain or tightness) place one tablet under your tongue. You can also take this medicine 5 to 10 minutes before an event likely to produce chest pain. Follow the directions on the prescription label. Let the tablet dissolve under the tongue. Do not swallow whole. Replace the dose if you accidentally swallow it. It will help if your mouth is not dry. Saliva around the tablet will help it to dissolve more quickly. Do not eat or drink, smoke or chew tobacco while a tablet is dissolving. If you are not better within 5 minutes after taking ONE dose of nitroglycerin, call 9-1-1 immediately to seek emergency medical care.  Do not take more than 3 nitroglycerin tablets over 15 minutes. If you take this medicine often to relieve symptoms of angina, your doctor or health care professional may provide you with different instructions to manage your symptoms. If symptoms do not go away after following these instructions, it is important to call 9-1-1 immediately. Do not take more than 3 nitroglycerin tablets over 15 minutes. Talk to your  pediatrician regarding the use of this medicine in children. Special care may be needed. Overdosage: If you think you have taken too much of this medicine contact a poison control center or emergency room at once. NOTE: This medicine is only for you. Do not share this medicine with others. What if I miss a dose? This does not apply. This medicine is only used as needed. What may interact with this medicine? Do not take this medicine with any of the following medications:  certain migraine medicines like ergotamine and dihydroergotamine (DHE)  medicines used to treat erectile dysfunction like sildenafil, tadalafil, and vardenafil  riociguat This medicine may also interact with the following medications:  alteplase  aspirin  heparin  medicines for high blood pressure  medicines for mental depression  other medicines used to treat angina  phenothiazines like chlorpromazine, mesoridazine, prochlorperazine, thioridazine This list may not describe all possible interactions. Give your health care provider a list of all the medicines, herbs, non-prescription drugs, or dietary supplements you use. Also tell them if you smoke, drink alcohol, or use illegal drugs. Some items may interact with your medicine. What should I watch for while using this medicine? Tell your doctor or health care professional if you feel your medicine is no longer working. Keep this medicine with you at all times. Sit or lie down when you take your medicine to prevent falling if you feel dizzy or faint after using it. Try to remain calm. This will help you to feel better faster. If you feel dizzy, take several deep breaths and lie down with your feet propped up, or bend forward with your head resting between your knees. You may get drowsy or dizzy. Do not drive, use machinery, or do anything that needs mental alertness until you know how this drug affects you. Do not stand or sit up quickly, especially if you are an  older patient. This reduces the risk of dizzy or fainting spells. Alcohol can make you more drowsy and dizzy. Avoid alcoholic drinks. Do not treat yourself for coughs, colds, or pain while you are taking this medicine without asking your doctor or health care professional for advice. Some ingredients may increase your blood pressure. What side effects may I notice from receiving this medicine? Side effects that you should report to your doctor or health care professional as soon as possible:  blurred vision  dry mouth  skin rash  sweating  the feeling of extreme pressure in the head  unusually weak or tired Side effects that usually do not require medical attention (report to your doctor or health care professional if they continue or are bothersome):  flushing of the face or neck  headache  irregular heartbeat, palpitations  nausea, vomiting This list may not describe all possible side effects. Call your doctor for medical advice about side effects. You may report side effects to FDA at 1-800-FDA-1088. Where should I keep my medicine? Keep out of the reach of children. Store at room temperature between 20 and 25 degrees C (68 and 77 degrees F). Store in Chief of Staff. Protect from  light and moisture. Keep tightly closed. Throw away any unused medicine after the expiration date. NOTE: This sheet is a summary. It may not cover all possible information. If you have questions about this medicine, talk to your doctor, pharmacist, or health care provider.  2020 Elsevier/Gold Standard (2013-09-29 17:57:36)

## 2020-07-26 NOTE — Progress Notes (Signed)
Cardiology Office Note    Date:  07/26/2020   ID:  Roger Hansen, DOB 1946/01/26, MRN [id number]  PCP:  Curlene Labrum, MD  Cardiologist: Kate Sable, MD (Inactive)    Chief Complaint  Patient presents with  . Follow-up    Annual Visit    History of Present Illness:    Roger Hansen is a 74 y.o. male with past medical history of NICM (EF 50% by echo in 2019 with NST showing no reversible ischemia and thought to be alcohol-induced), HTN, HLD, chronic anemia and alcohol abuse who presents to the office today for annual follow-up.   He most recently had a phone visit with Dr. Bronson Ing in 07/2019 and reported he had not consumed alcohol in several months. Denied any chest pain or dyspnea on exertion. He was continued on his current medication regimen and informed to follow-up in 1 year.   In talking with the patient today, he reports overall doing well from a cardiac perspective since his last visit. He does report occasional episodes of chest discomfort at rest or with activity but denies any recent symptoms and says he has not experienced intense pain in the past year like he did in the past. He does not exercise regularly but reports staying busy at baseline as the patient and his wife are raising 2 of their grandchildren who are 85 and 68 years old. He denies any recent dyspnea on exertion, orthopnea, PND or lower extremity edema.  He continues to drink alcohol but has reduced his use. He does not consume alcohol daily but instead says he typically drinks on the weekends but can consume 1/2 gallon over the weekend.   Past Medical History:  Diagnosis Date  . Alcohol abuse 11/24/2016  . Anemia 10/11/2013  . Cardiomyopathy ([place])    a. EF 50% by echo in 2019 with NST showing no reversible ischemia and thought to be alcohol-induced  . Chronic renal disease, stage 3, moderately decreased glomerular filtration rate (GFR) between 30-59 mL/min/1.73 square meter 12/02/2016  .  Colon polyps   . GERD (gastroesophageal reflux disease)   . High cholesterol   . Hypertension   . Sinus infection     Past Surgical History:  Procedure Laterality Date  . BIOPSY  06/03/2019   Procedure: BIOPSY;  Surgeon: Rogene Houston, MD;  [facility];  Service: Endoscopy;;  esophagus  . COLONOSCOPY N/A 10/26/2013   Procedure: COLONOSCOPY;  Surgeon: Rogene Houston, MD;  [facility];  Service: Endoscopy;  Laterality: N/A;  325-moved to 1230 Ann to notify pt  . Colonoscopy with polypectomy    . COLONOSCOPY WITH PROPOFOL N/A 06/03/2019   Procedure: COLONOSCOPY WITH PROPOFOL;  Surgeon: Rogene Houston, MD;  [facility];  Service: Endoscopy;  Laterality: N/A;  . ESOPHAGOGASTRODUODENOSCOPY (EGD) WITH PROPOFOL N/A 06/03/2019   Procedure: ESOPHAGOGASTRODUODENOSCOPY (EGD) WITH PROPOFOL;  Surgeon: Rogene Houston, MD;  [facility];  Service: Endoscopy;  Laterality: N/A;  . POLYPECTOMY  06/03/2019   Procedure: POLYPECTOMY;  Surgeon: Rogene Houston, MD;  [facility];  Service: Endoscopy;;  colon    Current Medications: Outpatient Medications Prior to Visit  Medication Sig Dispense Refill  . aspirin 81 MG tablet Take 1 tablet (81 mg total) by mouth daily. 30 tablet   . atorvastatin (LIPITOR) 10 MG tablet Take 10 mg by mouth daily.    Marland Kitchen buPROPion (WELLBUTRIN SR) 150 MG 12 hr tablet Take 150  mg by mouth daily.     . busPIRone (BUSPAR) 30 MG tablet Take 30 mg by mouth 3 (three) times daily.    . cholecalciferol (VITAMIN D) 1000 UNITS tablet Take 1,000 Units by mouth 2 (two) times daily.    . Eszopiclone 3 MG TABS Take 3 mg by mouth at bedtime.     . Ferrous Sulfate (IRON) 325 (65 Fe) MG TABS TAKE ONE TABLET BY MOUTH DAILY WITH BREAKFAST. (Patient taking differently: 1 tablet 2 (two) times a day. ) 30 tablet 3  . folic acid (FOLVITE) 1 MG tablet Take 1 mg by mouth daily.    . furosemide (LASIX) 20 MG tablet Take 20 mg by mouth daily.      Marland Kitchen gabapentin (NEURONTIN) 300 MG capsule Take 300 mg by mouth 3 (three) times daily as needed.    Marland Kitchen GNP VITAMIN B-1 100 MG tablet Take 100 mg by mouth daily.     Marland Kitchen losartan (COZAAR) 50 MG tablet Take 50 mg by mouth daily.    Marland Kitchen omeprazole (PRILOSEC) 20 MG capsule Take 20 mg by mouth daily as needed (Acid Reflux).    . potassium chloride SA (K-DUR) 20 MEQ tablet Take 20 mEq by mouth daily.     . traZODone (DESYREL) 100 MG tablet Take 100 mg by mouth at bedtime.    . vitamin B-12 (CYANOCOBALAMIN) 1000 MCG tablet Take 1,000 mcg by mouth daily.    . ziprasidone (GEODON) 40 MG capsule Take 40 mg by mouth 2 (two) times daily with a meal.      No facility-administered medications prior to visit.     Allergies:   Horseradish [armoracia rusticana ext (horseradish)]   Social History   Socioeconomic History  . Marital status: Married    Spouse name: Not on file  . Number of children: Not on file  . Years of education: Not on file  . Highest education level: Not on file  Occupational History  . Not on file  Tobacco Use  . Smoking status: Former Smoker    Packs/day: 0.50    Years: 20.00    Pack years: 10.00    Quit date: 12/27/1971    Years since quitting: 48.6  . Smokeless tobacco: Never Used  Substance and Sexual Activity  . Alcohol use: Yes    Comment: 1 pint of etoh a day ( not recently)  . Drug use: No  . Sexual activity: Not on file  Other Topics Concern  . Not on file  Social History Narrative  . Not on file   Social Determinants of Health   Financial Resource Strain:   . Difficulty of Paying Living Expenses:   Food Insecurity:   . Worried About Charity fundraiser in the Last Year:   . Arboriculturist in the Last Year:   Transportation Needs:   . Film/video editor (Medical):   Marland Kitchen Lack of Transportation (Non-Medical):   Physical Activity:   . Days of Exercise per Week:   . Minutes of Exercise per Session:   Stress:   . Feeling of Stress :   Social Connections:     . Frequency of Communication with Friends and Family:   . Frequency of Social Gatherings with Friends and Family:   . Attends Religious Services:   . Active Member of Clubs or Organizations:   . Attends Archivist Meetings:   Marland Kitchen Marital Status:      Family History:  The patient's family history  includes Hypertension in his mother.   Review of Systems:   Please see the history of present illness.     General:  No chills, fever, night sweats or weight changes.  Cardiovascular:  No chest pain, dyspnea on exertion, orthopnea, palpitations, paroxysmal nocturnal dyspnea. Positive for edema (resolved).  Dermatological: No rash, lesions/masses Respiratory: No cough, dyspnea Urologic: No hematuria, dysuria Abdominal:   No nausea, vomiting, diarrhea, bright red blood per rectum, melena, or hematemesis Neurologic:  No visual changes, wkns, changes in mental status. All other systems reviewed and are otherwise negative except as noted above.   Physical Exam:    VS:  BP 106/70   Pulse 88   Ht 5\' 5"  (1.651 m)   Wt 176 lb (79.8 kg)   SpO2 100%   BMI 29.29 kg/m    General: Well developed, well nourished,male appearing in no acute distress. Head: Normocephalic, atraumatic. Neck: No carotid bruits. JVD not elevated.  Lungs: Respirations regular and unlabored, without wheezes or rales.  Heart: Regular rate and rhythm. No S3 or S4.  No murmur, no rubs, or gallops appreciated. Abdomen: Appears non-distended. No obvious abdominal masses. Msk:  Strength and tone appear normal for age. No obvious joint deformities or effusions. Extremities: No clubbing or cyanosis. No lower extremity edema.  Distal pedal pulses are 2+ bilaterally. Neuro: Alert and oriented X 3. Moves all extremities spontaneously. No focal deficits noted. Psych:  Responds to questions appropriately with a normal affect. Skin: No rashes or lesions noted  Wt Readings from Last 3 Encounters:  07/26/20 176 lb (79.8 kg)   07/06/20 176 lb 14.4 oz (80.2 kg)  05/18/20 178 lb 12.8 oz (81.1 kg)      Studies/Labs Reviewed:   EKG:  EKG is ordered today. The ekg ordered today demonstrates NSR, HR 88 with no acute ST abnormalities.   Recent Labs: 07/06/2020: ALT 9; BUN 21; Creatinine, Ser 1.53; Hemoglobin 10.1; Platelets 301; Potassium 4.3; Sodium 138   Lipid Panel No results found for: CHOL, TRIG, HDL, CHOLHDL, VLDL, LDLCALC, LDLDIRECT  Additional studies/ records that were reviewed today include:   NST: 08/2018   Echocardiogram: 09/2018 Study Conclusions   - Left ventricle: The cavity size was normal. Wall thickness was  increased increased in a pattern of mild to moderate LVH. The  estimated ejection fraction was 50%. Probable hypokinesis of the  basalinferolateral and inferior myocardium. Indeterminate  diastolic function.  - Aortic valve: Mildly calcified annulus. Trileaflet. There was  mild regurgitation.  - Mitral valve: There was mild regurgitation.  - Right atrium: Central venous pressure (est): 3 mm Hg.  - Atrial septum: No defect or patent foramen ovale was identified.  - Tricuspid valve: There was trivial regurgitation.  - Pulmonary arteries: Systolic pressure could not be accurately  estimated.  - Pericardium, extracardiac: There was no pericardial effusion.   Assessment:    1. Secondary cardiomyopathy ([place])   2. Essential hypertension   3. Mixed hyperlipidemia   4. Stage 3 chronic kidney disease, unspecified whether stage 3a or 3b CKD      Plan:   In order of problems listed above:  1. Secondary Cardiomyopathy - His EF was slightly reduced at 50% by echocardiogram in 2019 and his stress test test at that time showed no reversible ischemia or infarction as outlined above. His cardiomyopathy was felt to be alcohol induced. - He denies any recent dyspnea on exertion, orthopnea, PND or lower extremity edema. Appears euvolemic by examination today. He remains on Lasix  20 mg which he takes as needed along with Losartan 50 mg daily. He has reduced his alcohol use but still consumes over half a gallon of liquor on the weekends. Continued reduction was advised. - He does report occasional chest discomfort but denies any recent symptoms. I encouraged him to make [place] aware if he develops recurrent pain as repeat stress testing could be pursued. He was given an updated Rx for SL NTG. Remains on ASA and statin therapy.   2. HTN - BP is well controlled at 106/70 during today's visit. Continue current medication regimen with Losartan 50 mg daily.  3. HLD - Followed by his PCP. I will request a copy of his most recent records as he reports labs were obtained by the [facility] a few weeks ago. He remains on Atorvastatin 10 mg daily.  4. Stage 3 CKD - Creatinine has been variable over the past several months, ranging from 0.8 -1.53. Will request a copy of his most recent labs from the [place]. Creatinine was 1.53 in 06/2020.    Medication Adjustments/Labs and Tests Ordered: Current medicines are reviewed at length with the patient today.  Concerns regarding medicines are outlined above.  Medication changes, Labs and Tests ordered today are listed in the Patient Instructions below. Patient Instructions  Medication Instructions:  Start Nitroglycerin 0.4 mg every 5 minutes as needed for chest pain  *If you need a refill on your cardiac medications before your next appointment, please call your pharmacy*   Lab Work: None ordered   If you have labs (blood work) drawn today and your tests are completely normal, you will receive your results only by: Marland Kitchen MyChart Message (if you have MyChart) OR . A paper copy in the mail If you have any lab test that is abnormal or we need to change your treatment, we will call you to review the results.   Testing/Procedures: None ordered    Follow-Up: At [facility], you and your health needs are our priority.  As part of our continuing  mission to provide you with exceptional heart care, we have created designated Provider Care Teams.  These Care Teams include your primary Cardiologist (physician) and Advanced Practice Providers (APPs -  Physician Assistants and Nurse Practitioners) who all work together to provide you with the care you need, when you need it.  We recommend signing up for the patient portal called "MyChart".  Sign up information is provided on this After Visit Summary.  MyChart is used to connect with patients for Virtual Visits (Telemedicine).  Patients are able to view lab/test results, encounter notes, upcoming appointments, etc.  Non-urgent messages can be sent to your provider as well.   To learn more about what you can do with MyChart, go to [email or web address].    Your next appointment:   12 month(s)  The format for your next appointment:   In Person  Provider:   You may see Rozann Lesches, MD  or one of the following Advanced Practice Providers on your designated Care Team:    Bernerd Pho, PA-C   Ermalinda Barrios, PA-C     Other Instructions Nitroglycerin sublingual tablets What is this medicine? NITROGLYCERIN (nye troe GLI ser in) is a type of vasodilator. It relaxes blood vessels, increasing the blood and oxygen supply to your heart. This medicine is used to relieve chest pain caused by angina. It is also used to prevent chest pain before activities like climbing stairs, going outdoors in cold weather, or sexual activity. This  medicine may be used for other purposes; ask your health care provider or pharmacist if you have questions. COMMON BRAND NAME(S): Nitroquick, Nitrostat, Nitrotab What should I tell my health care provider before I take this medicine? They need to know if you have any of these conditions:  anemia  head injury, recent stroke, or bleeding in the brain  liver disease  previous heart attack  an unusual or allergic reaction to nitroglycerin, other medicines,  foods, dyes, or preservatives  pregnant or trying to get pregnant  breast-feeding How should I use this medicine? Take this medicine by mouth as needed. At the first sign of an angina attack (chest pain or tightness) place one tablet under your tongue. You can also take this medicine 5 to 10 minutes before an event likely to produce chest pain. Follow the directions on the prescription label. Let the tablet dissolve under the tongue. Do not swallow whole. Replace the dose if you accidentally swallow it. It will help if your mouth is not dry. Saliva around the tablet will help it to dissolve more quickly. Do not eat or drink, smoke or chew tobacco while a tablet is dissolving. If you are not better within 5 minutes after taking ONE dose of nitroglycerin, call 9-1-1 immediately to seek emergency medical care. Do not take more than 3 nitroglycerin tablets over 15 minutes. If you take this medicine often to relieve symptoms of angina, your doctor or health care professional may provide you with different instructions to manage your symptoms. If symptoms do not go away after following these instructions, it is important to call 9-1-1 immediately. Do not take more than 3 nitroglycerin tablets over 15 minutes. Talk to your pediatrician regarding the use of this medicine in children. Special care may be needed. Overdosage: If you think you have taken too much of this medicine contact a poison control center or emergency room at once. NOTE: This medicine is only for you. Do not share this medicine with others. What if I miss a dose? This does not apply. This medicine is only used as needed. What may interact with this medicine? Do not take this medicine with any of the following medications:  certain migraine medicines like ergotamine and dihydroergotamine (DHE)  medicines used to treat erectile dysfunction like sildenafil, tadalafil, and vardenafil  riociguat This medicine may also interact with the  following medications:  alteplase  aspirin  heparin  medicines for high blood pressure  medicines for mental depression  other medicines used to treat angina  phenothiazines like chlorpromazine, mesoridazine, prochlorperazine, thioridazine This list may not describe all possible interactions. Give your health care provider a list of all the medicines, herbs, non-prescription drugs, or dietary supplements you use. Also tell them if you smoke, drink alcohol, or use illegal drugs. Some items may interact with your medicine. What should I watch for while using this medicine? Tell your doctor or health care professional if you feel your medicine is no longer working. Keep this medicine with you at all times. Sit or lie down when you take your medicine to prevent falling if you feel dizzy or faint after using it. Try to remain calm. This will help you to feel better faster. If you feel dizzy, take several deep breaths and lie down with your feet propped up, or bend forward with your head resting between your knees. You may get drowsy or dizzy. Do not drive, use machinery, or do anything that needs mental alertness until you know how this  drug affects you. Do not stand or sit up quickly, especially if you are an older patient. This reduces the risk of dizzy or fainting spells. Alcohol can make you more drowsy and dizzy. Avoid alcoholic drinks. Do not treat yourself for coughs, colds, or pain while you are taking this medicine without asking your doctor or health care professional for advice. Some ingredients may increase your blood pressure. What side effects may I notice from receiving this medicine? Side effects that you should report to your doctor or health care professional as soon as possible:  blurred vision  dry mouth  skin rash  sweating  the feeling of extreme pressure in the head  unusually weak or tired Side effects that usually do not require medical attention (report to your  doctor or health care professional if they continue or are bothersome):  flushing of the face or neck  headache  irregular heartbeat, palpitations  nausea, vomiting This list may not describe all possible side effects. Call your doctor for medical advice about side effects. You may report side effects to FDA at 1-800-FDA-1088. Where should I keep my medicine? Keep out of the reach of children. Store at room temperature between 20 and 25 degrees C (68 and 77 degrees F). Store in Chief of Staff. Protect from light and moisture. Keep tightly closed. Throw away any unused medicine after the expiration date. NOTE: This sheet is a summary. It may not cover all possible information. If you have questions about this medicine, talk to your doctor, pharmacist, or health care provider.  2020 Elsevier/Gold Standard (2013-09-29 17:57:36)   Signed, Erma Heritage, PA-C  07/26/2020 5:15 PM    [facility] [address] [place], [place] [address] Phone: [phone] Fax: [phone]

## 2020-07-27 ENCOUNTER — Inpatient Hospital Stay (HOSPITAL_COMMUNITY): Payer: Medicare Other

## 2020-07-27 ENCOUNTER — Other Ambulatory Visit: Payer: Self-pay

## 2020-07-27 ENCOUNTER — Inpatient Hospital Stay (HOSPITAL_COMMUNITY): Payer: Medicare Other | Attending: Hematology

## 2020-07-27 VITALS — BP 100/66 | HR 73 | Temp 97.3°F | Resp 18

## 2020-07-27 DIAGNOSIS — D631 Anemia in chronic kidney disease: Secondary | ICD-10-CM | POA: Diagnosis present

## 2020-07-27 DIAGNOSIS — D649 Anemia, unspecified: Secondary | ICD-10-CM | POA: Diagnosis not present

## 2020-07-27 DIAGNOSIS — N183 Chronic kidney disease, stage 3 unspecified: Secondary | ICD-10-CM

## 2020-07-27 LAB — CBC
HCT: 33.1 % — ABNORMAL LOW (ref 39.0–52.0)
Hemoglobin: 10.4 g/dL — ABNORMAL LOW (ref 13.0–17.0)
MCH: 28 pg (ref 26.0–34.0)
MCHC: 31.4 g/dL (ref 30.0–36.0)
MCV: 89.2 fL (ref 80.0–100.0)
Platelets: 113 10*3/uL — ABNORMAL LOW (ref 150–400)
RBC: 3.71 MIL/uL — ABNORMAL LOW (ref 4.22–5.81)
RDW: 17.1 % — ABNORMAL HIGH (ref 11.5–15.5)
WBC: 6.8 10*3/uL (ref 4.0–10.5)
nRBC: 0 % (ref 0.0–0.2)

## 2020-07-27 MED ORDER — EPOETIN ALFA-EPBX 40000 UNIT/ML IJ SOLN
40000.0000 [IU] | Freq: Once | INTRAMUSCULAR | Status: AC
  Administered 2020-07-27: 40000 [IU] via SUBCUTANEOUS
  Filled 2020-07-27: qty 1

## 2020-07-27 NOTE — Progress Notes (Signed)
Pt here today for Retacrit injection. Pt given injection in left arm. Pt tolerated injection well with no complaints. Pt stable and discharged home ambulatory. Pt to return as scheduled.

## 2020-08-17 ENCOUNTER — Inpatient Hospital Stay (HOSPITAL_COMMUNITY): Payer: Medicare Other | Admitting: Nurse Practitioner

## 2020-08-17 ENCOUNTER — Inpatient Hospital Stay (HOSPITAL_COMMUNITY): Payer: Medicare Other

## 2020-08-17 ENCOUNTER — Inpatient Hospital Stay (HOSPITAL_COMMUNITY): Payer: Medicare Other | Attending: Hematology

## 2020-10-16 DIAGNOSIS — E876 Hypokalemia: Secondary | ICD-10-CM | POA: Diagnosis not present

## 2020-10-29 DIAGNOSIS — E876 Hypokalemia: Secondary | ICD-10-CM | POA: Diagnosis not present

## 2020-11-05 DIAGNOSIS — R152 Fecal urgency: Secondary | ICD-10-CM | POA: Diagnosis not present

## 2020-11-05 DIAGNOSIS — Z23 Encounter for immunization: Secondary | ICD-10-CM | POA: Diagnosis not present

## 2020-11-05 DIAGNOSIS — R197 Diarrhea, unspecified: Secondary | ICD-10-CM | POA: Diagnosis not present

## 2020-11-06 DIAGNOSIS — R197 Diarrhea, unspecified: Secondary | ICD-10-CM | POA: Diagnosis not present

## 2020-11-13 ENCOUNTER — Other Ambulatory Visit (HOSPITAL_COMMUNITY): Payer: Medicare Other

## 2020-11-13 ENCOUNTER — Ambulatory Visit (HOSPITAL_COMMUNITY): Payer: Medicare Other

## 2020-11-13 ENCOUNTER — Other Ambulatory Visit (HOSPITAL_COMMUNITY): Payer: Self-pay

## 2020-11-13 DIAGNOSIS — D649 Anemia, unspecified: Secondary | ICD-10-CM

## 2020-11-14 ENCOUNTER — Inpatient Hospital Stay (HOSPITAL_COMMUNITY): Payer: Medicare Other

## 2020-11-14 ENCOUNTER — Inpatient Hospital Stay (HOSPITAL_COMMUNITY): Payer: Medicare Other | Attending: Hematology

## 2020-11-14 DIAGNOSIS — N183 Chronic kidney disease, stage 3 unspecified: Secondary | ICD-10-CM | POA: Insufficient documentation

## 2020-11-14 DIAGNOSIS — D631 Anemia in chronic kidney disease: Secondary | ICD-10-CM | POA: Insufficient documentation

## 2020-11-15 DIAGNOSIS — I5032 Chronic diastolic (congestive) heart failure: Secondary | ICD-10-CM | POA: Diagnosis not present

## 2020-11-15 DIAGNOSIS — I700 Atherosclerosis of aorta: Secondary | ICD-10-CM | POA: Diagnosis not present

## 2020-11-15 DIAGNOSIS — Z6830 Body mass index (BMI) 30.0-30.9, adult: Secondary | ICD-10-CM | POA: Diagnosis not present

## 2020-11-15 DIAGNOSIS — E876 Hypokalemia: Secondary | ICD-10-CM | POA: Diagnosis not present

## 2020-11-15 DIAGNOSIS — N401 Enlarged prostate with lower urinary tract symptoms: Secondary | ICD-10-CM | POA: Diagnosis not present

## 2020-11-15 DIAGNOSIS — D6489 Other specified anemias: Secondary | ICD-10-CM | POA: Diagnosis not present

## 2020-11-15 DIAGNOSIS — E871 Hypo-osmolality and hyponatremia: Secondary | ICD-10-CM | POA: Diagnosis not present

## 2020-11-15 DIAGNOSIS — I426 Alcoholic cardiomyopathy: Secondary | ICD-10-CM | POA: Diagnosis not present

## 2020-11-15 DIAGNOSIS — Z23 Encounter for immunization: Secondary | ICD-10-CM | POA: Diagnosis not present

## 2020-11-16 ENCOUNTER — Other Ambulatory Visit (HOSPITAL_COMMUNITY): Payer: Self-pay

## 2020-11-16 DIAGNOSIS — D649 Anemia, unspecified: Secondary | ICD-10-CM

## 2020-11-19 ENCOUNTER — Encounter (HOSPITAL_COMMUNITY): Payer: Self-pay

## 2020-11-19 ENCOUNTER — Other Ambulatory Visit: Payer: Self-pay

## 2020-11-19 ENCOUNTER — Inpatient Hospital Stay (HOSPITAL_COMMUNITY): Payer: Medicare Other

## 2020-11-19 ENCOUNTER — Inpatient Hospital Stay (HOSPITAL_COMMUNITY): Payer: Medicare Other | Attending: Hematology and Oncology

## 2020-11-19 VITALS — BP 142/64 | HR 76 | Temp 97.0°F | Resp 18

## 2020-11-19 DIAGNOSIS — D509 Iron deficiency anemia, unspecified: Secondary | ICD-10-CM | POA: Diagnosis not present

## 2020-11-19 DIAGNOSIS — D649 Anemia, unspecified: Secondary | ICD-10-CM

## 2020-11-19 DIAGNOSIS — N183 Chronic kidney disease, stage 3 unspecified: Secondary | ICD-10-CM | POA: Insufficient documentation

## 2020-11-19 DIAGNOSIS — D631 Anemia in chronic kidney disease: Secondary | ICD-10-CM | POA: Insufficient documentation

## 2020-11-19 LAB — CBC WITH DIFFERENTIAL/PLATELET
Abs Immature Granulocytes: 0.02 10*3/uL (ref 0.00–0.07)
Basophils Absolute: 0.1 10*3/uL (ref 0.0–0.1)
Basophils Relative: 1 %
Eosinophils Absolute: 0.3 10*3/uL (ref 0.0–0.5)
Eosinophils Relative: 6 %
HCT: 30.9 % — ABNORMAL LOW (ref 39.0–52.0)
Hemoglobin: 9.3 g/dL — ABNORMAL LOW (ref 13.0–17.0)
Immature Granulocytes: 0 %
Lymphocytes Relative: 28 %
Lymphs Abs: 1.4 10*3/uL (ref 0.7–4.0)
MCH: 28.5 pg (ref 26.0–34.0)
MCHC: 30.1 g/dL (ref 30.0–36.0)
MCV: 94.8 fL (ref 80.0–100.0)
Monocytes Absolute: 0.6 10*3/uL (ref 0.1–1.0)
Monocytes Relative: 12 %
Neutro Abs: 2.6 10*3/uL (ref 1.7–7.7)
Neutrophils Relative %: 53 %
Platelets: 314 10*3/uL (ref 150–400)
RBC: 3.26 MIL/uL — ABNORMAL LOW (ref 4.22–5.81)
RDW: 16.9 % — ABNORMAL HIGH (ref 11.5–15.5)
WBC: 5 10*3/uL (ref 4.0–10.5)
nRBC: 0 % (ref 0.0–0.2)

## 2020-11-19 MED ORDER — EPOETIN ALFA-EPBX 40000 UNIT/ML IJ SOLN
40000.0000 [IU] | Freq: Once | INTRAMUSCULAR | Status: AC
  Administered 2020-11-19: 40000 [IU] via SUBCUTANEOUS
  Filled 2020-11-19: qty 1

## 2020-11-19 NOTE — Progress Notes (Signed)
Patient presents today for Retacrit injection. Hemoglobin reviewed prior to administration. VSS. Injection tolerated without incident or complaint. See MAR for details. Patient discharged in satisfactory condition with follow up instructions. 

## 2020-12-04 ENCOUNTER — Ambulatory Visit (HOSPITAL_COMMUNITY): Payer: Medicare Other

## 2020-12-04 ENCOUNTER — Ambulatory Visit (HOSPITAL_COMMUNITY): Payer: Medicare Other | Admitting: Hematology and Oncology

## 2020-12-04 ENCOUNTER — Other Ambulatory Visit (HOSPITAL_COMMUNITY): Payer: Medicare Other

## 2020-12-04 NOTE — Progress Notes (Signed)
[place] [place], [place] [address]   CLINIC:  Medical Oncology/Hematology  PCP:  Curlene Labrum, MD [place] [address] [phone]   REASON FOR VISIT: Follow-up for iron deficiency anemia and anemia secondary to chronic kidney disease.   CURRENT THERAPY: Retacrit injections every 3 weeks.   INTERVAL HISTORY:  Mr. Kubas is a 74 y.o. male with a diagnosis of an iron deficiency anemia and anemia secondary to chronic kidney disease.  He presents to clinic today having last been seen on 07/06/2020.  His most recent labs from 11/19/2020 returned showing a hemoglobin of 9.3, hematocrit 30.9 and MCV of 94.8.  Mr. Gewirtz reports an occasional cough, episodic chest pain for which he uses nitroglycerin, constipation, diarrhea, nausea, occasional dizziness, occasional headaches, anxiety, depression, sleep problems, and generalized neuropathy.  He reports that his appetite is around 70% of where it should be and that his energy level is approximately at 70%.  REVIEW OF SYSTEMS:   Review of Systems  Constitutional: Positive for malaise/fatigue. Negative for chills, diaphoresis, fever and weight loss.  HENT: Negative for congestion and sore throat.   Eyes: Negative.   Respiratory: Positive for shortness of breath. Negative for cough, hemoptysis, sputum production and wheezing.   Cardiovascular: Positive for chest pain. Negative for palpitations, orthopnea, claudication and leg swelling.  Gastrointestinal: Positive for constipation and diarrhea. Negative for abdominal pain, blood in stool, nausea and vomiting.  Genitourinary: Negative.   Musculoskeletal: Negative for back pain, joint pain, myalgias and neck pain.  Skin: Negative.   Neurological: Positive for dizziness, tingling and headaches. Negative for weakness.  Endo/Heme/Allergies: Negative.   Psychiatric/Behavioral: Positive for depression. The patient is nervous/anxious and has insomnia.       PAST MEDICAL/SURGICAL HISTORY:  Past Medical History:  Diagnosis Date  . Alcohol abuse 11/24/2016  . Anemia 10/11/2013  . Cardiomyopathy ([place])    a. EF 50% by echo in 2019 with NST showing no reversible ischemia and thought to be alcohol-induced  . Chronic renal disease, stage 3, moderately decreased glomerular filtration rate (GFR) between 30-59 mL/min/1.73 square meter (HCC) 12/02/2016  . Colon polyps   . GERD (gastroesophageal reflux disease)   . High cholesterol   . Hypertension   . Sinus infection    Past Surgical History:  Procedure Laterality Date  . BIOPSY  06/03/2019   Procedure: BIOPSY;  Surgeon: Rogene Houston, MD;  [facility];  Service: Endoscopy;;  esophagus  . COLONOSCOPY N/A 10/26/2013   Procedure: COLONOSCOPY;  Surgeon: Rogene Houston, MD;  [facility];  Service: Endoscopy;  Laterality: N/A;  325-moved to 1230 Ann to notify pt  . Colonoscopy with polypectomy    . COLONOSCOPY WITH PROPOFOL N/A 06/03/2019   Procedure: COLONOSCOPY WITH PROPOFOL;  Surgeon: Rogene Houston, MD;  [facility];  Service: Endoscopy;  Laterality: N/A;  . ESOPHAGOGASTRODUODENOSCOPY (EGD) WITH PROPOFOL N/A 06/03/2019   Procedure: ESOPHAGOGASTRODUODENOSCOPY (EGD) WITH PROPOFOL;  Surgeon: Rogene Houston, MD;  [facility];  Service: Endoscopy;  Laterality: N/A;  . POLYPECTOMY  06/03/2019   Procedure: POLYPECTOMY;  Surgeon: Rogene Houston, MD;  [facility];  Service: Endoscopy;;  colon     SOCIAL HISTORY:  Social History   Socioeconomic History  . Marital status: Married    Spouse name: Not on file  . Number of children: Not on file  . Years of education: Not on file  .  Highest education level: Not on file  Occupational History  . Not on file  Tobacco Use  . Smoking status: Former Smoker    Packs/day: 0.50    Years: 20.00    Pack years: 10.00    Quit date: 12/27/1971    Years since quitting: 48.9  . Smokeless  tobacco: Never Used  Substance and Sexual Activity  . Alcohol use: Yes    Comment: 1 pint of etoh a day ( not recently)  . Drug use: No  . Sexual activity: Not on file  Other Topics Concern  . Not on file  Social History Narrative  . Not on file   Social Determinants of Health   Financial Resource Strain: Not on file  Food Insecurity: No Food Insecurity  . Worried About Charity fundraiser in the Last Year: Never true  . Ran Out of Food in the Last Year: Never true  Transportation Needs: No Transportation Needs  . Lack of Transportation (Medical): No  . Lack of Transportation (Non-Medical): No  Physical Activity: Inactive  . Days of Exercise per Week: 0 days  . Minutes of Exercise per Session: 0 min  Stress: No Stress Concern Present  . Feeling of Stress : Not at all  Social Connections: Not on file  Intimate Partner Violence: Not At Risk  . Fear of Current or Ex-Partner: No  . Emotionally Abused: No  . Physically Abused: No  . Sexually Abused: No    FAMILY HISTORY:  Family History  Problem Relation Age of Onset  . Hypertension Mother   . Colon cancer Neg Hx     CURRENT MEDICATIONS:  Outpatient Encounter Medications as of 12/05/2020  Medication Sig Note  . aspirin 81 MG tablet Take 1 tablet (81 mg total) by mouth daily.   Marland Kitchen atorvastatin (LIPITOR) 10 MG tablet Take 10 mg by mouth daily.   Marland Kitchen buPROPion (WELLBUTRIN SR) 150 MG 12 hr tablet Take 150 mg by mouth daily.    . busPIRone (BUSPAR) 30 MG tablet Take 30 mg by mouth 3 (three) times daily.   . cholecalciferol (VITAMIN D) 1000 UNITS tablet Take 1,000 Units by mouth 2 (two) times daily.   . Eszopiclone 3 MG TABS Take 3 mg by mouth at bedtime.    . Ferrous Sulfate (IRON) 325 (65 Fe) MG TABS TAKE ONE TABLET BY MOUTH DAILY WITH BREAKFAST. (Patient taking differently: 1 tablet 2 (two) times a day. )   . folic acid (FOLVITE) 1 MG tablet Take 1 mg by mouth daily.   . furosemide (LASIX) 20 MG tablet Take 20 mg by mouth  daily.    Marland Kitchen gabapentin (NEURONTIN) 300 MG capsule Take 300 mg by mouth 3 (three) times daily as needed. 10/21/2016: Received from: External Pharmacy  . GNP VITAMIN B-1 100 MG tablet Take 100 mg by mouth daily.    Marland Kitchen losartan (COZAAR) 50 MG tablet Take 50 mg by mouth daily.   . nitroGLYCERIN (NITROSTAT) 0.4 MG SL tablet Place 1 tablet (0.4 mg total) under the tongue every 5 (five) minutes as needed for chest pain.   Marland Kitchen omeprazole (PRILOSEC) 20 MG capsule Take 20 mg by mouth daily as needed (Acid Reflux).   . potassium chloride SA (K-DUR) 20 MEQ tablet Take 20 mEq by mouth daily.    . traZODone (DESYREL) 100 MG tablet Take 100 mg by mouth at bedtime.   . vitamin B-12 (CYANOCOBALAMIN) 1000 MCG tablet Take 1,000 mcg by mouth daily.   . ziprasidone (  GEODON) 40 MG capsule Take 40 mg by mouth 2 (two) times daily with a meal.     No facility-administered encounter medications on file as of 12/05/2020.    ALLERGIES:  Allergies  Allergen Reactions  . Horseradish [Armoracia Rusticana Ext (Horseradish)] Anaphylaxis     PHYSICAL EXAM:  ECOG Performance status: 1  There were no vitals filed for this visit. There were no vitals filed for this visit.  Physical Exam Constitutional:      General: He is not in acute distress.    Appearance: He is normal weight. He is not ill-appearing, toxic-appearing or diaphoretic.  HENT:     Head: Normocephalic and atraumatic.  Eyes:     General: No scleral icterus.       Right eye: No discharge.        Left eye: No discharge.     Conjunctiva/sclera: Conjunctivae normal.  Cardiovascular:     Rate and Rhythm: Normal rate and regular rhythm.     Heart sounds: No murmur heard. No friction rub. No gallop.   Pulmonary:     Effort: Pulmonary effort is normal. No respiratory distress.     Breath sounds: Normal breath sounds. No wheezing, rhonchi or rales.  Abdominal:     General: Bowel sounds are normal. There is no distension.     Tenderness: There is no  abdominal tenderness. There is no guarding.  Musculoskeletal:     Cervical back: No rigidity.     Right lower leg: No edema.     Left lower leg: No edema.  Lymphadenopathy:     Cervical: No cervical adenopathy.  Skin:    General: Skin is warm and dry.     Coloration: Skin is not jaundiced or pale.     Findings: No erythema or rash.  Neurological:     Coordination: Coordination normal.     Gait: Gait normal.  Psychiatric:        Mood and Affect: Mood normal.        Behavior: Behavior normal.        Thought Content: Thought content normal.        Judgment: Judgment normal.      LABORATORY DATA:  I have reviewed the labs as listed.  CBC    Component Value Date/Time   WBC 5.0 11/19/2020 0809   RBC 3.26 (L) 11/19/2020 0809   HGB 9.3 (L) 11/19/2020 0809   HCT 30.9 (L) 11/19/2020 0809   HCT 27.9 (L) 08/13/2016 0944   PLT 314 11/19/2020 0809   MCV 94.8 11/19/2020 0809   MCH 28.5 11/19/2020 0809   MCHC 30.1 11/19/2020 0809   RDW 16.9 (H) 11/19/2020 0809   LYMPHSABS 1.4 11/19/2020 0809   MONOABS 0.6 11/19/2020 0809   EOSABS 0.3 11/19/2020 0809   BASOSABS 0.1 11/19/2020 0809   CMP Latest Ref Rng & Units 07/06/2020 05/18/2020 05/01/2020  Glucose 70 - 99 mg/dL 87 95 105(H)  BUN 8 - 23 mg/dL 21 12 9   Creatinine 0.61 - 1.24 mg/dL 1.53(H) 0.94 0.87  Sodium 135 - 145 mmol/L 138 142 139  Potassium 3.5 - 5.1 mmol/L 4.3 3.4(L) 3.4(L)  Chloride 98 - 111 mmol/L 105 102 95(L)  CO2 22 - 32 mmol/L 25 29 31   Calcium 8.9 - 10.3 mg/dL 9.0 8.7(L) 9.1  Total Protein 6.5 - 8.1 g/dL 7.2 6.6 6.4(L)  Total Bilirubin 0.3 - 1.2 mg/dL 0.5 0.7 0.8  Alkaline Phos 38 - 126 U/L 57 53 72  AST 15 -  41 U/L 14(L) 14(L) 22  ALT 0 - 44 U/L 9 11 11     All questions were answered to patient's stated satisfaction. Encouraged patient to call with any new concerns or questions before his next visit to the cancer center and we can certain see him sooner, if needed.     ASSESSMENT & PLAN:  1) Iron deficiency  anemia: -Iron studies will be completed on the patient's return in 3 weeks.   2) Anemia secondary to chronic kidney disease: -A CBC returned with a hemoglobin of 9.6 and hematocrit of 32 today. -The patient will receive Retacrit today and will return for labs and possible dosing with Retacrit and 1 and 2 weeks. -The patient will return for labs, possible dosing with Retacrit and follow-up with Dr. Delton Coombes in 3 weeks.  No problem-specific Assessment & Plan notes found for this encounter.     Orders placed this encounter:  No orders of the defined types were placed in this encounter.    Sandi Mealy, MHS, PA-C Physician Assistant

## 2020-12-05 ENCOUNTER — Other Ambulatory Visit: Payer: Self-pay

## 2020-12-05 ENCOUNTER — Inpatient Hospital Stay (HOSPITAL_COMMUNITY): Payer: Medicare Other

## 2020-12-05 ENCOUNTER — Inpatient Hospital Stay (HOSPITAL_BASED_OUTPATIENT_CLINIC_OR_DEPARTMENT_OTHER): Payer: Medicare Other | Admitting: Medical

## 2020-12-05 VITALS — BP 130/64 | HR 66 | Temp 97.0°F | Resp 18 | Wt 184.0 lb

## 2020-12-05 DIAGNOSIS — R5383 Other fatigue: Secondary | ICD-10-CM

## 2020-12-05 DIAGNOSIS — D631 Anemia in chronic kidney disease: Secondary | ICD-10-CM | POA: Diagnosis not present

## 2020-12-05 DIAGNOSIS — N183 Chronic kidney disease, stage 3 unspecified: Secondary | ICD-10-CM

## 2020-12-05 DIAGNOSIS — D500 Iron deficiency anemia secondary to blood loss (chronic): Secondary | ICD-10-CM | POA: Diagnosis not present

## 2020-12-05 DIAGNOSIS — D649 Anemia, unspecified: Secondary | ICD-10-CM

## 2020-12-05 LAB — CBC WITH DIFFERENTIAL/PLATELET
Abs Immature Granulocytes: 0.01 10*3/uL (ref 0.00–0.07)
Basophils Absolute: 0 10*3/uL (ref 0.0–0.1)
Basophils Relative: 1 %
Eosinophils Absolute: 0.2 10*3/uL (ref 0.0–0.5)
Eosinophils Relative: 5 %
HCT: 32 % — ABNORMAL LOW (ref 39.0–52.0)
Hemoglobin: 9.6 g/dL — ABNORMAL LOW (ref 13.0–17.0)
Immature Granulocytes: 0 %
Lymphocytes Relative: 40 %
Lymphs Abs: 1.4 10*3/uL (ref 0.7–4.0)
MCH: 27.7 pg (ref 26.0–34.0)
MCHC: 30 g/dL (ref 30.0–36.0)
MCV: 92.5 fL (ref 80.0–100.0)
Monocytes Absolute: 0.3 10*3/uL (ref 0.1–1.0)
Monocytes Relative: 9 %
Neutro Abs: 1.6 10*3/uL — ABNORMAL LOW (ref 1.7–7.7)
Neutrophils Relative %: 45 %
Platelets: 233 10*3/uL (ref 150–400)
RBC: 3.46 MIL/uL — ABNORMAL LOW (ref 4.22–5.81)
RDW: 16.8 % — ABNORMAL HIGH (ref 11.5–15.5)
WBC: 3.6 10*3/uL — ABNORMAL LOW (ref 4.0–10.5)
nRBC: 0 % (ref 0.0–0.2)

## 2020-12-05 MED ORDER — EPOETIN ALFA-EPBX 40000 UNIT/ML IJ SOLN
40000.0000 [IU] | Freq: Once | INTRAMUSCULAR | Status: AC
  Administered 2020-12-05: 40000 [IU] via SUBCUTANEOUS
  Filled 2020-12-05: qty 1

## 2020-12-05 NOTE — Progress Notes (Signed)
Patient tolerated Retacrit injection with no complaints voiced.  Site clean and dry with no bruising or swelling noted.  No complaints of pain.  Discharged with vital signs stable and no signs or symptoms of distress noted.  

## 2020-12-05 NOTE — Progress Notes (Signed)
OK for Retacrit shot.  Sandi Mealy, MHS, PA-C Physician Assistant

## 2020-12-06 ENCOUNTER — Ambulatory Visit (HOSPITAL_COMMUNITY): Payer: Medicare Other

## 2020-12-06 ENCOUNTER — Other Ambulatory Visit (HOSPITAL_COMMUNITY): Payer: Medicare Other

## 2020-12-06 ENCOUNTER — Ambulatory Visit (HOSPITAL_COMMUNITY): Payer: Medicare Other | Admitting: Hematology and Oncology

## 2020-12-11 ENCOUNTER — Other Ambulatory Visit (HOSPITAL_COMMUNITY): Payer: Self-pay | Admitting: Surgery

## 2020-12-11 DIAGNOSIS — N183 Chronic kidney disease, stage 3 unspecified: Secondary | ICD-10-CM

## 2020-12-11 DIAGNOSIS — D649 Anemia, unspecified: Secondary | ICD-10-CM

## 2020-12-12 ENCOUNTER — Inpatient Hospital Stay (HOSPITAL_COMMUNITY): Payer: Medicare Other

## 2020-12-12 ENCOUNTER — Other Ambulatory Visit: Payer: Self-pay

## 2020-12-12 VITALS — BP 129/72 | Temp 97.4°F | Resp 20

## 2020-12-12 DIAGNOSIS — N183 Chronic kidney disease, stage 3 unspecified: Secondary | ICD-10-CM

## 2020-12-12 DIAGNOSIS — D631 Anemia in chronic kidney disease: Secondary | ICD-10-CM | POA: Diagnosis not present

## 2020-12-12 DIAGNOSIS — D649 Anemia, unspecified: Secondary | ICD-10-CM

## 2020-12-12 LAB — CBC
HCT: 31.9 % — ABNORMAL LOW (ref 39.0–52.0)
Hemoglobin: 9.6 g/dL — ABNORMAL LOW (ref 13.0–17.0)
MCH: 27.7 pg (ref 26.0–34.0)
MCHC: 30.1 g/dL (ref 30.0–36.0)
MCV: 92.2 fL (ref 80.0–100.0)
Platelets: 277 10*3/uL (ref 150–400)
RBC: 3.46 MIL/uL — ABNORMAL LOW (ref 4.22–5.81)
RDW: 17.6 % — ABNORMAL HIGH (ref 11.5–15.5)
WBC: 4.1 10*3/uL (ref 4.0–10.5)
nRBC: 0 % (ref 0.0–0.2)

## 2020-12-12 MED ORDER — EPOETIN ALFA-EPBX 40000 UNIT/ML IJ SOLN
40000.0000 [IU] | Freq: Once | INTRAMUSCULAR | Status: AC
  Administered 2020-12-12: 40000 [IU] via SUBCUTANEOUS
  Filled 2020-12-12: qty 1

## 2020-12-12 NOTE — Progress Notes (Signed)
Patient tolerated Retacrit injection with no complaints voiced.  Site clean and dry with no bruising or swelling noted.  No complaints of pain.  Discharged with vital signs stable and no signs or symptoms of distress noted.  

## 2020-12-14 DIAGNOSIS — I130 Hypertensive heart and chronic kidney disease with heart failure and stage 1 through stage 4 chronic kidney disease, or unspecified chronic kidney disease: Secondary | ICD-10-CM | POA: Diagnosis not present

## 2020-12-14 DIAGNOSIS — N183 Chronic kidney disease, stage 3 unspecified: Secondary | ICD-10-CM | POA: Diagnosis not present

## 2020-12-14 DIAGNOSIS — E7849 Other hyperlipidemia: Secondary | ICD-10-CM | POA: Diagnosis not present

## 2020-12-14 DIAGNOSIS — I5033 Acute on chronic diastolic (congestive) heart failure: Secondary | ICD-10-CM | POA: Diagnosis not present

## 2020-12-19 ENCOUNTER — Other Ambulatory Visit: Payer: Self-pay

## 2020-12-19 ENCOUNTER — Inpatient Hospital Stay (HOSPITAL_COMMUNITY): Payer: Medicare Other

## 2020-12-19 ENCOUNTER — Inpatient Hospital Stay (HOSPITAL_COMMUNITY): Payer: Medicare Other | Attending: Hematology

## 2020-12-19 VITALS — BP 125/71 | HR 72 | Temp 97.3°F | Resp 18

## 2020-12-19 DIAGNOSIS — Z7982 Long term (current) use of aspirin: Secondary | ICD-10-CM | POA: Diagnosis not present

## 2020-12-19 DIAGNOSIS — Z79899 Other long term (current) drug therapy: Secondary | ICD-10-CM | POA: Insufficient documentation

## 2020-12-19 DIAGNOSIS — Z87891 Personal history of nicotine dependence: Secondary | ICD-10-CM | POA: Insufficient documentation

## 2020-12-19 DIAGNOSIS — N183 Chronic kidney disease, stage 3 unspecified: Secondary | ICD-10-CM

## 2020-12-19 DIAGNOSIS — D631 Anemia in chronic kidney disease: Secondary | ICD-10-CM | POA: Diagnosis not present

## 2020-12-19 DIAGNOSIS — D649 Anemia, unspecified: Secondary | ICD-10-CM

## 2020-12-19 LAB — CBC
HCT: 34.8 % — ABNORMAL LOW (ref 39.0–52.0)
Hemoglobin: 10.4 g/dL — ABNORMAL LOW (ref 13.0–17.0)
MCH: 27.6 pg (ref 26.0–34.0)
MCHC: 29.9 g/dL — ABNORMAL LOW (ref 30.0–36.0)
MCV: 92.3 fL (ref 80.0–100.0)
Platelets: 306 10*3/uL (ref 150–400)
RBC: 3.77 MIL/uL — ABNORMAL LOW (ref 4.22–5.81)
RDW: 18.1 % — ABNORMAL HIGH (ref 11.5–15.5)
WBC: 4 10*3/uL (ref 4.0–10.5)
nRBC: 0 % (ref 0.0–0.2)

## 2020-12-19 MED ORDER — EPOETIN ALFA-EPBX 40000 UNIT/ML IJ SOLN
40000.0000 [IU] | Freq: Once | INTRAMUSCULAR | Status: AC
  Administered 2020-12-19: 40000 [IU] via SUBCUTANEOUS
  Filled 2020-12-19: qty 1

## 2020-12-19 NOTE — Progress Notes (Signed)
Patient tolerated Retacrit injection with no complaints voiced.  Site clean and dry with no bruising or swelling noted.  No complaints of pain.  Discharged with vital signs stable and no signs or symptoms of distress noted.  

## 2020-12-25 ENCOUNTER — Inpatient Hospital Stay (HOSPITAL_BASED_OUTPATIENT_CLINIC_OR_DEPARTMENT_OTHER): Payer: Medicare Other | Admitting: Oncology

## 2020-12-25 ENCOUNTER — Inpatient Hospital Stay (HOSPITAL_COMMUNITY): Payer: Medicare Other

## 2020-12-25 ENCOUNTER — Other Ambulatory Visit: Payer: Self-pay

## 2020-12-25 VITALS — BP 139/67 | HR 62 | Temp 97.2°F | Resp 18 | Wt 195.5 lb

## 2020-12-25 DIAGNOSIS — D649 Anemia, unspecified: Secondary | ICD-10-CM

## 2020-12-25 DIAGNOSIS — Z7982 Long term (current) use of aspirin: Secondary | ICD-10-CM | POA: Diagnosis not present

## 2020-12-25 DIAGNOSIS — D500 Iron deficiency anemia secondary to blood loss (chronic): Secondary | ICD-10-CM

## 2020-12-25 DIAGNOSIS — D631 Anemia in chronic kidney disease: Secondary | ICD-10-CM | POA: Diagnosis not present

## 2020-12-25 DIAGNOSIS — Z79899 Other long term (current) drug therapy: Secondary | ICD-10-CM | POA: Diagnosis not present

## 2020-12-25 DIAGNOSIS — N183 Chronic kidney disease, stage 3 unspecified: Secondary | ICD-10-CM

## 2020-12-25 DIAGNOSIS — R5383 Other fatigue: Secondary | ICD-10-CM

## 2020-12-25 DIAGNOSIS — Z87891 Personal history of nicotine dependence: Secondary | ICD-10-CM | POA: Diagnosis not present

## 2020-12-25 LAB — CBC WITH DIFFERENTIAL/PLATELET
Abs Immature Granulocytes: 0.01 10*3/uL (ref 0.00–0.07)
Basophils Absolute: 0 10*3/uL (ref 0.0–0.1)
Basophils Relative: 1 %
Eosinophils Absolute: 0.2 10*3/uL (ref 0.0–0.5)
Eosinophils Relative: 6 %
HCT: 36.5 % — ABNORMAL LOW (ref 39.0–52.0)
Hemoglobin: 10.9 g/dL — ABNORMAL LOW (ref 13.0–17.0)
Immature Granulocytes: 0 %
Lymphocytes Relative: 30 %
Lymphs Abs: 1.2 10*3/uL (ref 0.7–4.0)
MCH: 27.3 pg (ref 26.0–34.0)
MCHC: 29.9 g/dL — ABNORMAL LOW (ref 30.0–36.0)
MCV: 91.3 fL (ref 80.0–100.0)
Monocytes Absolute: 0.4 10*3/uL (ref 0.1–1.0)
Monocytes Relative: 11 %
Neutro Abs: 2.3 10*3/uL (ref 1.7–7.7)
Neutrophils Relative %: 52 %
Platelets: 299 10*3/uL (ref 150–400)
RBC: 4 MIL/uL — ABNORMAL LOW (ref 4.22–5.81)
RDW: 18.2 % — ABNORMAL HIGH (ref 11.5–15.5)
WBC: 4.2 10*3/uL (ref 4.0–10.5)
nRBC: 0 % (ref 0.0–0.2)

## 2020-12-25 LAB — COMPREHENSIVE METABOLIC PANEL
ALT: 11 U/L (ref 0–44)
AST: 13 U/L — ABNORMAL LOW (ref 15–41)
Albumin: 3.9 g/dL (ref 3.5–5.0)
Alkaline Phosphatase: 67 U/L (ref 38–126)
Anion gap: 6 (ref 5–15)
BUN: 16 mg/dL (ref 8–23)
CO2: 28 mmol/L (ref 22–32)
Calcium: 9.2 mg/dL (ref 8.9–10.3)
Chloride: 104 mmol/L (ref 98–111)
Creatinine, Ser: 1.01 mg/dL (ref 0.61–1.24)
GFR, Estimated: >60 mL/min (ref >60)
Glucose, Bld: 100 mg/dL — ABNORMAL HIGH (ref 70–99)
Potassium: 4.6 mmol/L (ref 3.5–5.1)
Sodium: 138 mmol/L (ref 135–145)
Total Bilirubin: 1.1 mg/dL (ref 0.3–1.2)
Total Protein: 7 g/dL (ref 6.5–8.1)

## 2020-12-25 LAB — LACTATE DEHYDROGENASE: LDH: 111 U/L (ref 98–192)

## 2020-12-25 LAB — VITAMIN D 25 HYDROXY (VIT D DEFICIENCY, FRACTURES): Vit D, 25-Hydroxy: 34.66 ng/mL (ref 30–100)

## 2020-12-25 LAB — IRON AND TIBC
Iron: 39 ug/dL — ABNORMAL LOW (ref 45–182)
Saturation Ratios: 14 % — ABNORMAL LOW (ref 17.9–39.5)
TIBC: 270 ug/dL (ref 250–450)
UIBC: 231 ug/dL

## 2020-12-25 LAB — FERRITIN: Ferritin: 188 ng/mL (ref 24–336)

## 2020-12-25 MED ORDER — EPOETIN ALFA-EPBX 40000 UNIT/ML IJ SOLN
40000.0000 [IU] | Freq: Once | INTRAMUSCULAR | Status: AC
  Administered 2020-12-25: 40000 [IU] via SUBCUTANEOUS
  Filled 2020-12-25: qty 1

## 2020-12-25 NOTE — Progress Notes (Signed)
Patient tolerated Retacrit injection with no complaints voiced.  Site clean and dry with no bruising or swelling noted.  No complaints of pain.  Discharged with vital signs stable and no signs or symptoms of distress noted.  

## 2020-12-25 NOTE — Progress Notes (Signed)
[place], [place] [address]   CLINIC:  Medical Oncology/Hematology  PCP:  Curlene Labrum, MD [place] [address] [phone]   REASON FOR VISIT: Follow-up for iron deficiency anemia and anemia secondary to chronic kidney disease.   CURRENT THERAPY: Retacrit injections every 2 weeks.   INTERVAL HISTORY:  Roger Hansen is a 75 y.o. male with a diagnosis of an iron deficiency anemia and anemia secondary to chronic kidney disease.  He was last seen in clinic on 12/05/2020.  Historically, he receives  Retacrit injections q 2 weeks.  He received Retacrit last on 12/19/2020.  Baseline hemoglobin is between 9-10. hemoglobin on 12/19/2020 was 10.4.  He received 40,000 units Retacrit.  Today, he reports feeling well.  Endorses chronic intermittent chest pain, leg swelling, and neck pain.  He uses his furosemide as needed.  Has chronic anxiety, depression and insomnia.  He is followed by PCP for this. He reports that his appetite is around 70% of where it should be and that his energy level is approximately at 70%.  REVIEW OF SYSTEMS:   Review of Systems  Constitutional: Positive for malaise/fatigue. Negative for chills, diaphoresis, fever and weight loss.  HENT: Negative for congestion and sore throat.   Eyes: Negative.   Respiratory: Positive for shortness of breath. Negative for cough, hemoptysis, sputum production and wheezing.   Cardiovascular: Positive for chest pain. Negative for palpitations, orthopnea, claudication and leg swelling.  Gastrointestinal: Positive for constipation and diarrhea. Negative for abdominal pain, blood in stool, nausea and vomiting.  Genitourinary: Negative.   Musculoskeletal: Negative for back pain, joint pain, myalgias and neck pain.  Skin: Negative.   Neurological: Positive for dizziness, tingling and headaches. Negative for weakness.  Endo/Heme/Allergies: Negative.   Psychiatric/Behavioral: Positive for  depression. The patient is nervous/anxious and has insomnia.      PAST MEDICAL/SURGICAL HISTORY:  Past Medical History:  Diagnosis Date  . Alcohol abuse 11/24/2016  . Anemia 10/11/2013  . Cardiomyopathy ([place])    a. EF 50% by echo in 2019 with NST showing no reversible ischemia and thought to be alcohol-induced  . Chronic renal disease, stage 3, moderately decreased glomerular filtration rate (GFR) between 30-59 mL/min/1.73 square meter (HCC) 12/02/2016  . Colon polyps   . GERD (gastroesophageal reflux disease)   . High cholesterol   . Hypertension   . Sinus infection    Past Surgical History:  Procedure Laterality Date  . BIOPSY  06/03/2019   Procedure: BIOPSY;  Surgeon: Rogene Houston, MD;  [facility];  Service: Endoscopy;;  esophagus  . COLONOSCOPY N/A 10/26/2013   Procedure: COLONOSCOPY;  Surgeon: Rogene Houston, MD;  [facility];  Service: Endoscopy;  Laterality: N/A;  325-moved to 1230 Ann to notify pt  . Colonoscopy with polypectomy    . COLONOSCOPY WITH PROPOFOL N/A 06/03/2019   Procedure: COLONOSCOPY WITH PROPOFOL;  Surgeon: Rogene Houston, MD;  [facility];  Service: Endoscopy;  Laterality: N/A;  . ESOPHAGOGASTRODUODENOSCOPY (EGD) WITH PROPOFOL N/A 06/03/2019   Procedure: ESOPHAGOGASTRODUODENOSCOPY (EGD) WITH PROPOFOL;  Surgeon: Rogene Houston, MD;  [facility];  Service: Endoscopy;  Laterality: N/A;  . POLYPECTOMY  06/03/2019   Procedure: POLYPECTOMY;  Surgeon: Rogene Houston, MD;  [facility];  Service: Endoscopy;;  colon     SOCIAL HISTORY:  Social History   Socioeconomic History  . Marital status: Married    Spouse name:  Not on file  . Number of children: Not on file  . Years of education: Not on file  . Highest education level: Not on file  Occupational History  . Not on file  Tobacco Use  . Smoking status: Former Smoker    Packs/day: 0.50    Years: 20.00    Pack years: 10.00    Quit  date: 12/27/1971    Years since quitting: 49.0  . Smokeless tobacco: Never Used  Substance and Sexual Activity  . Alcohol use: Yes    Comment: 1 pint of etoh a day ( not recently)  . Drug use: No  . Sexual activity: Not on file  Other Topics Concern  . Not on file  Social History Narrative  . Not on file   Social Determinants of Health   Financial Resource Strain: Not on file  Food Insecurity: No Food Insecurity  . Worried About Charity fundraiser in the Last Year: Never true  . Ran Out of Food in the Last Year: Never true  Transportation Needs: No Transportation Needs  . Lack of Transportation (Medical): No  . Lack of Transportation (Non-Medical): No  Physical Activity: Inactive  . Days of Exercise per Week: 0 days  . Minutes of Exercise per Session: 0 min  Stress: No Stress Concern Present  . Feeling of Stress : Not at all  Social Connections: Not on file  Intimate Partner Violence: Not At Risk  . Fear of Current or Ex-Partner: No  . Emotionally Abused: No  . Physically Abused: No  . Sexually Abused: No    FAMILY HISTORY:  Family History  Problem Relation Age of Onset  . Hypertension Mother   . Colon cancer Neg Hx     CURRENT MEDICATIONS:  Outpatient Encounter Medications as of 12/25/2020  Medication Sig Note  . ALPRAZolam (XANAX) 1 MG tablet    . aspirin 81 MG tablet Take 1 tablet (81 mg total) by mouth daily.   Marland Kitchen atorvastatin (LIPITOR) 10 MG tablet Take 10 mg by mouth daily.   Marland Kitchen buPROPion (WELLBUTRIN SR) 150 MG 12 hr tablet Take 150 mg by mouth daily.    . chlorhexidine (PERIDEX) 0.12 % solution    . cholecalciferol (VITAMIN D) 1000 UNITS tablet Take 1,000 Units by mouth 2 (two) times daily.   Marland Kitchen donepezil (ARICEPT) 10 MG tablet    . Eszopiclone 3 MG TABS Take 3 mg by mouth at bedtime.    . Ferrous Sulfate (IRON) 325 (65 Fe) MG TABS TAKE ONE TABLET BY MOUTH DAILY WITH BREAKFAST. (Patient taking differently: 1 tablet 2 (two) times a day.)   . folic acid  (FOLVITE) 1 MG tablet Take 1 mg by mouth daily.   . furosemide (LASIX) 20 MG tablet Take 20 mg by mouth daily.    Marland Kitchen gabapentin (NEURONTIN) 300 MG capsule Take 300 mg by mouth 3 (three) times daily as needed. 10/21/2016: Received from: External Pharmacy  . GNP VITAMIN B-1 100 MG tablet Take 100 mg by mouth daily.    Marland Kitchen losartan (COZAAR) 50 MG tablet Take 50 mg by mouth daily.   . nitroGLYCERIN (NITROSTAT) 0.4 MG SL tablet Place 1 tablet (0.4 mg total) under the tongue every 5 (five) minutes as needed for chest pain. (Patient not taking: Reported on 12/05/2020)   . omeprazole (PRILOSEC) 20 MG capsule Take 20 mg by mouth daily as needed (Acid Reflux).   . potassium chloride SA (K-DUR) 20 MEQ tablet Take 20 mEq by mouth  daily.    . QUEtiapine (SEROQUEL) 100 MG tablet    . traZODone (DESYREL) 100 MG tablet Take 100 mg by mouth at bedtime.   . vitamin B-12 (CYANOCOBALAMIN) 1000 MCG tablet Take 1,000 mcg by mouth daily.   . ziprasidone (GEODON) 40 MG capsule Take 40 mg by mouth 2 (two) times daily with a meal.     No facility-administered encounter medications on file as of 12/25/2020.    ALLERGIES:  Allergies  Allergen Reactions  . Horseradish [Armoracia Rusticana Ext (Horseradish)] Anaphylaxis     PHYSICAL EXAM:  ECOG Performance status: 1  Vitals:   12/25/20 0941  BP: 139/67  Pulse: 62  Resp: 18  Temp: (!) 97.2 F (36.2 C)  SpO2: 100%   Filed Weights   12/25/20 0941  Weight: 195 lb 8 oz (88.7 kg)    Physical Exam Constitutional:      General: He is not in acute distress.    Appearance: He is normal weight. He is not ill-appearing, toxic-appearing or diaphoretic.  HENT:     Head: Normocephalic and atraumatic.  Eyes:     General: No scleral icterus.       Right eye: No discharge.        Left eye: No discharge.     Conjunctiva/sclera: Conjunctivae normal.  Cardiovascular:     Rate and Rhythm: Normal rate and regular rhythm.     Heart sounds: No murmur heard. No friction  rub. No gallop.   Pulmonary:     Effort: Pulmonary effort is normal. No respiratory distress.     Breath sounds: Normal breath sounds. No wheezing, rhonchi or rales.  Abdominal:     General: Bowel sounds are normal. There is no distension.     Tenderness: There is no abdominal tenderness. There is no guarding.  Musculoskeletal:     Cervical back: No rigidity.     Right lower leg: No edema.     Left lower leg: No edema.  Lymphadenopathy:     Cervical: No cervical adenopathy.  Skin:    General: Skin is warm and dry.     Coloration: Skin is not jaundiced or pale.     Findings: No erythema or rash.  Neurological:     Coordination: Coordination normal.     Gait: Gait normal.  Psychiatric:        Mood and Affect: Mood normal.        Behavior: Behavior normal.        Thought Content: Thought content normal.        Judgment: Judgment normal.      LABORATORY DATA:  I have reviewed the labs as listed.  CBC    Component Value Date/Time   WBC 4.2 12/25/2020 0912   RBC 4.00 (L) 12/25/2020 0912   HGB 10.9 (L) 12/25/2020 0912   HCT 36.5 (L) 12/25/2020 0912   HCT 27.9 (L) 08/13/2016 0944   PLT 299 12/25/2020 0912   MCV 91.3 12/25/2020 0912   MCH 27.3 12/25/2020 0912   MCHC 29.9 (L) 12/25/2020 0912   RDW 18.2 (H) 12/25/2020 0912   LYMPHSABS 1.2 12/25/2020 0912   MONOABS 0.4 12/25/2020 0912   EOSABS 0.2 12/25/2020 0912   BASOSABS 0.0 12/25/2020 0912   CMP Latest Ref Rng & Units 12/25/2020 07/06/2020 05/18/2020  Glucose 70 - 99 mg/dL 100(H) 87 95  BUN 8 - 23 mg/dL 16 21 12   Creatinine 0.61 - 1.24 mg/dL 1.01 1.53(H) 0.94  Sodium 135 - 145 mmol/L 138  138 142  Potassium 3.5 - 5.1 mmol/L 4.6 4.3 3.4(L)  Chloride 98 - 111 mmol/L 104 105 102  CO2 22 - 32 mmol/L 28 25 29   Calcium 8.9 - 10.3 mg/dL 9.2 9.0 8.7(L)  Total Protein 6.5 - 8.1 g/dL 7.0 7.2 6.6  Total Bilirubin 0.3 - 1.2 mg/dL 1.1 0.5 0.7  Alkaline Phos 38 - 126 U/L 67 57 53  AST 15 - 41 U/L 13(L) 14(L) 14(L)  ALT 0 - 44 U/L  11 9 11     All questions were answered to patient's stated satisfaction. Encouraged patient to call with any new concerns or questions before his next visit to the cancer center and we can certain see him sooner, if needed.     ASSESSMENT & PLAN:  1) Iron deficiency anemia: -Last Feraheme with given on 05/29/20 -Last iron levels from 07/06/2020 revealed a ferritin of 529 and saturation ratio of 16%. - MCV is 91.3  2) Anemia secondary to chronic kidney disease: -Lab work from 12/25/2020 shows a hemoglobin of 10.9 and hematocrit 36.5. -Per treatment parameters he is to receive Retacrit every 2 weeks for hemoglobin less than 11. -Proceed with Retacrit injection today. -RTC in 2 weeks for repeat labs (CBC, iron, ferritin) and possible Retacrit injection.  Disposition: -RTC in 2 weeks for repeat labs(CBC, iron and ferritin) and Retacrit injection.  -He will follow-up every 2 weeks with lab work (cbc) and possible Retacrit injection and see a provider every 2 months.  No problem-specific Assessment & Plan notes found for this encounter.  Orders placed this encounter:  No orders of the defined types were placed in this encounter.  Faythe Casa, NP 12/25/2020 10:20 AM

## 2021-01-07 ENCOUNTER — Other Ambulatory Visit (HOSPITAL_COMMUNITY): Payer: Self-pay | Admitting: Surgery

## 2021-01-07 DIAGNOSIS — N183 Chronic kidney disease, stage 3 unspecified: Secondary | ICD-10-CM

## 2021-01-07 DIAGNOSIS — D500 Iron deficiency anemia secondary to blood loss (chronic): Secondary | ICD-10-CM

## 2021-01-07 DIAGNOSIS — D649 Anemia, unspecified: Secondary | ICD-10-CM

## 2021-01-08 ENCOUNTER — Inpatient Hospital Stay (HOSPITAL_COMMUNITY): Payer: Medicare Other

## 2021-01-08 ENCOUNTER — Other Ambulatory Visit: Payer: Self-pay

## 2021-01-08 DIAGNOSIS — Z87891 Personal history of nicotine dependence: Secondary | ICD-10-CM | POA: Diagnosis not present

## 2021-01-08 DIAGNOSIS — N183 Chronic kidney disease, stage 3 unspecified: Secondary | ICD-10-CM

## 2021-01-08 DIAGNOSIS — Z79899 Other long term (current) drug therapy: Secondary | ICD-10-CM | POA: Diagnosis not present

## 2021-01-08 DIAGNOSIS — Z7982 Long term (current) use of aspirin: Secondary | ICD-10-CM | POA: Diagnosis not present

## 2021-01-08 DIAGNOSIS — D500 Iron deficiency anemia secondary to blood loss (chronic): Secondary | ICD-10-CM

## 2021-01-08 DIAGNOSIS — D649 Anemia, unspecified: Secondary | ICD-10-CM

## 2021-01-08 DIAGNOSIS — D631 Anemia in chronic kidney disease: Secondary | ICD-10-CM | POA: Diagnosis not present

## 2021-01-08 LAB — IRON AND TIBC
Iron: 79 ug/dL (ref 45–182)
Saturation Ratios: 29 % (ref 17.9–39.5)
TIBC: 276 ug/dL (ref 250–450)
UIBC: 197 ug/dL

## 2021-01-08 LAB — CBC WITH DIFFERENTIAL/PLATELET
Abs Immature Granulocytes: 0.03 10*3/uL (ref 0.00–0.07)
Basophils Absolute: 0 10*3/uL (ref 0.0–0.1)
Basophils Relative: 0 %
Eosinophils Absolute: 0.1 10*3/uL (ref 0.0–0.5)
Eosinophils Relative: 3 %
HCT: 38.5 % — ABNORMAL LOW (ref 39.0–52.0)
Hemoglobin: 11.3 g/dL — ABNORMAL LOW (ref 13.0–17.0)
Immature Granulocytes: 1 %
Lymphocytes Relative: 31 %
Lymphs Abs: 1.5 10*3/uL (ref 0.7–4.0)
MCH: 26.4 pg (ref 26.0–34.0)
MCHC: 29.4 g/dL — ABNORMAL LOW (ref 30.0–36.0)
MCV: 90 fL (ref 80.0–100.0)
Monocytes Absolute: 0.6 10*3/uL (ref 0.1–1.0)
Monocytes Relative: 12 %
Neutro Abs: 2.6 10*3/uL (ref 1.7–7.7)
Neutrophils Relative %: 53 %
Platelets: 242 10*3/uL (ref 150–400)
RBC: 4.28 MIL/uL (ref 4.22–5.81)
RDW: 17.2 % — ABNORMAL HIGH (ref 11.5–15.5)
WBC: 4.8 10*3/uL (ref 4.0–10.5)
nRBC: 0 % (ref 0.0–0.2)

## 2021-01-08 LAB — FERRITIN: Ferritin: 321 ng/mL (ref 24–336)

## 2021-01-08 NOTE — Progress Notes (Signed)
Hgb 11.3 today. Retacrit injection held per parameters. Discussed this information with the pt who verbalized understanding. Pt discharged self ambulatory in satisfactory condition

## 2021-01-12 DIAGNOSIS — I5033 Acute on chronic diastolic (congestive) heart failure: Secondary | ICD-10-CM | POA: Diagnosis not present

## 2021-01-12 DIAGNOSIS — I130 Hypertensive heart and chronic kidney disease with heart failure and stage 1 through stage 4 chronic kidney disease, or unspecified chronic kidney disease: Secondary | ICD-10-CM | POA: Diagnosis not present

## 2021-01-12 DIAGNOSIS — N183 Chronic kidney disease, stage 3 unspecified: Secondary | ICD-10-CM | POA: Diagnosis not present

## 2021-01-12 DIAGNOSIS — E7849 Other hyperlipidemia: Secondary | ICD-10-CM | POA: Diagnosis not present

## 2021-01-21 ENCOUNTER — Other Ambulatory Visit (HOSPITAL_COMMUNITY): Payer: Self-pay | Admitting: *Deleted

## 2021-01-21 DIAGNOSIS — N183 Chronic kidney disease, stage 3 unspecified: Secondary | ICD-10-CM

## 2021-01-21 DIAGNOSIS — D500 Iron deficiency anemia secondary to blood loss (chronic): Secondary | ICD-10-CM

## 2021-01-21 DIAGNOSIS — D649 Anemia, unspecified: Secondary | ICD-10-CM

## 2021-01-22 ENCOUNTER — Other Ambulatory Visit: Payer: Self-pay

## 2021-01-22 ENCOUNTER — Inpatient Hospital Stay (HOSPITAL_COMMUNITY): Payer: Medicare Other

## 2021-01-22 ENCOUNTER — Inpatient Hospital Stay (HOSPITAL_COMMUNITY): Payer: Medicare Other | Attending: Hematology

## 2021-01-22 VITALS — BP 146/77 | HR 69 | Temp 97.8°F | Resp 17

## 2021-01-22 DIAGNOSIS — D649 Anemia, unspecified: Secondary | ICD-10-CM

## 2021-01-22 DIAGNOSIS — N183 Chronic kidney disease, stage 3 unspecified: Secondary | ICD-10-CM | POA: Diagnosis not present

## 2021-01-22 DIAGNOSIS — D631 Anemia in chronic kidney disease: Secondary | ICD-10-CM | POA: Insufficient documentation

## 2021-01-22 DIAGNOSIS — D500 Iron deficiency anemia secondary to blood loss (chronic): Secondary | ICD-10-CM

## 2021-01-22 LAB — CBC
HCT: 35.4 % — ABNORMAL LOW (ref 39.0–52.0)
Hemoglobin: 10.7 g/dL — ABNORMAL LOW (ref 13.0–17.0)
MCH: 26.4 pg (ref 26.0–34.0)
MCHC: 30.2 g/dL (ref 30.0–36.0)
MCV: 87.2 fL (ref 80.0–100.0)
Platelets: 205 10*3/uL (ref 150–400)
RBC: 4.06 MIL/uL — ABNORMAL LOW (ref 4.22–5.81)
RDW: 17.2 % — ABNORMAL HIGH (ref 11.5–15.5)
WBC: 5.1 10*3/uL (ref 4.0–10.5)
nRBC: 0 % (ref 0.0–0.2)

## 2021-01-22 MED ORDER — EPOETIN ALFA-EPBX 40000 UNIT/ML IJ SOLN
40000.0000 [IU] | Freq: Once | INTRAMUSCULAR | Status: AC
  Administered 2021-01-22: 40000 [IU] via SUBCUTANEOUS

## 2021-01-22 MED ORDER — EPOETIN ALFA-EPBX 40000 UNIT/ML IJ SOLN
INTRAMUSCULAR | Status: AC
  Filled 2021-01-22: qty 1

## 2021-01-22 NOTE — Progress Notes (Signed)
Patient tolerated Retacrit 40,000 U injection with no complaints voiced.  Site clean and dry with no bruising or swelling noted.  No complaints of pain.  Discharged with vital signs stable and no signs or symptoms of distress noted.  

## 2021-02-05 ENCOUNTER — Inpatient Hospital Stay (HOSPITAL_COMMUNITY): Payer: Medicare Other

## 2021-02-05 ENCOUNTER — Other Ambulatory Visit: Payer: Self-pay

## 2021-02-05 ENCOUNTER — Encounter (HOSPITAL_COMMUNITY): Payer: Self-pay

## 2021-02-05 VITALS — BP 136/60 | HR 66 | Temp 97.1°F | Resp 18

## 2021-02-05 DIAGNOSIS — N183 Chronic kidney disease, stage 3 unspecified: Secondary | ICD-10-CM

## 2021-02-05 DIAGNOSIS — D649 Anemia, unspecified: Secondary | ICD-10-CM

## 2021-02-05 DIAGNOSIS — D631 Anemia in chronic kidney disease: Secondary | ICD-10-CM | POA: Diagnosis not present

## 2021-02-05 DIAGNOSIS — D500 Iron deficiency anemia secondary to blood loss (chronic): Secondary | ICD-10-CM

## 2021-02-05 LAB — CBC
HCT: 36.8 % — ABNORMAL LOW (ref 39.0–52.0)
Hemoglobin: 10.9 g/dL — ABNORMAL LOW (ref 13.0–17.0)
MCH: 26.3 pg (ref 26.0–34.0)
MCHC: 29.6 g/dL — ABNORMAL LOW (ref 30.0–36.0)
MCV: 88.9 fL (ref 80.0–100.0)
Platelets: 209 10*3/uL (ref 150–400)
RBC: 4.14 MIL/uL — ABNORMAL LOW (ref 4.22–5.81)
RDW: 17.2 % — ABNORMAL HIGH (ref 11.5–15.5)
WBC: 4 10*3/uL (ref 4.0–10.5)
nRBC: 0 % (ref 0.0–0.2)

## 2021-02-05 MED ORDER — EPOETIN ALFA-EPBX 40000 UNIT/ML IJ SOLN
40000.0000 [IU] | Freq: Once | INTRAMUSCULAR | Status: AC
  Administered 2021-02-05: 40000 [IU] via SUBCUTANEOUS
  Filled 2021-02-05: qty 1

## 2021-02-05 NOTE — Progress Notes (Signed)
Patient presents today for Retacrit injection. Hemoglobin reviewed prior to administration. VSS. Injection tolerated without incident or complaint. See MAR for details. Patient discharged in satisfactory condition with follow up instructions. 

## 2021-02-07 DIAGNOSIS — E7849 Other hyperlipidemia: Secondary | ICD-10-CM | POA: Diagnosis not present

## 2021-02-07 DIAGNOSIS — E876 Hypokalemia: Secondary | ICD-10-CM | POA: Diagnosis not present

## 2021-02-07 DIAGNOSIS — D529 Folate deficiency anemia, unspecified: Secondary | ICD-10-CM | POA: Diagnosis not present

## 2021-02-07 DIAGNOSIS — R946 Abnormal results of thyroid function studies: Secondary | ICD-10-CM | POA: Diagnosis not present

## 2021-02-07 DIAGNOSIS — D649 Anemia, unspecified: Secondary | ICD-10-CM | POA: Diagnosis not present

## 2021-02-07 DIAGNOSIS — E782 Mixed hyperlipidemia: Secondary | ICD-10-CM | POA: Diagnosis not present

## 2021-02-07 DIAGNOSIS — I10 Essential (primary) hypertension: Secondary | ICD-10-CM | POA: Diagnosis not present

## 2021-02-07 DIAGNOSIS — D519 Vitamin B12 deficiency anemia, unspecified: Secondary | ICD-10-CM | POA: Diagnosis not present

## 2021-02-07 DIAGNOSIS — R5383 Other fatigue: Secondary | ICD-10-CM | POA: Diagnosis not present

## 2021-02-07 DIAGNOSIS — N183 Chronic kidney disease, stage 3 unspecified: Secondary | ICD-10-CM | POA: Diagnosis not present

## 2021-02-13 DIAGNOSIS — I5032 Chronic diastolic (congestive) heart failure: Secondary | ICD-10-CM | POA: Diagnosis not present

## 2021-02-13 DIAGNOSIS — N401 Enlarged prostate with lower urinary tract symptoms: Secondary | ICD-10-CM | POA: Diagnosis not present

## 2021-02-13 DIAGNOSIS — E7849 Other hyperlipidemia: Secondary | ICD-10-CM | POA: Diagnosis not present

## 2021-02-13 DIAGNOSIS — I10 Essential (primary) hypertension: Secondary | ICD-10-CM | POA: Diagnosis not present

## 2021-02-13 DIAGNOSIS — I700 Atherosclerosis of aorta: Secondary | ICD-10-CM | POA: Diagnosis not present

## 2021-02-13 DIAGNOSIS — Z0001 Encounter for general adult medical examination with abnormal findings: Secondary | ICD-10-CM | POA: Diagnosis not present

## 2021-02-13 DIAGNOSIS — I426 Alcoholic cardiomyopathy: Secondary | ICD-10-CM | POA: Diagnosis not present

## 2021-02-13 DIAGNOSIS — E871 Hypo-osmolality and hyponatremia: Secondary | ICD-10-CM | POA: Diagnosis not present

## 2021-02-19 ENCOUNTER — Inpatient Hospital Stay (HOSPITAL_COMMUNITY): Payer: Medicare Other | Attending: Hematology

## 2021-02-19 ENCOUNTER — Other Ambulatory Visit: Payer: Self-pay

## 2021-02-19 ENCOUNTER — Inpatient Hospital Stay (HOSPITAL_BASED_OUTPATIENT_CLINIC_OR_DEPARTMENT_OTHER): Payer: Medicare Other | Admitting: Oncology

## 2021-02-19 ENCOUNTER — Other Ambulatory Visit (HOSPITAL_COMMUNITY): Payer: Medicare Other

## 2021-02-19 ENCOUNTER — Inpatient Hospital Stay (HOSPITAL_COMMUNITY): Payer: Medicare Other

## 2021-02-19 VITALS — BP 131/61 | HR 60 | Temp 97.1°F | Wt 203.0 lb

## 2021-02-19 DIAGNOSIS — Z79899 Other long term (current) drug therapy: Secondary | ICD-10-CM | POA: Insufficient documentation

## 2021-02-19 DIAGNOSIS — D631 Anemia in chronic kidney disease: Secondary | ICD-10-CM | POA: Diagnosis not present

## 2021-02-19 DIAGNOSIS — N183 Chronic kidney disease, stage 3 unspecified: Secondary | ICD-10-CM | POA: Insufficient documentation

## 2021-02-19 DIAGNOSIS — D649 Anemia, unspecified: Secondary | ICD-10-CM

## 2021-02-19 DIAGNOSIS — Z7982 Long term (current) use of aspirin: Secondary | ICD-10-CM | POA: Insufficient documentation

## 2021-02-19 DIAGNOSIS — Z87891 Personal history of nicotine dependence: Secondary | ICD-10-CM | POA: Diagnosis not present

## 2021-02-19 DIAGNOSIS — D500 Iron deficiency anemia secondary to blood loss (chronic): Secondary | ICD-10-CM

## 2021-02-19 LAB — CBC WITH DIFFERENTIAL/PLATELET
Abs Immature Granulocytes: 0.01 10*3/uL (ref 0.00–0.07)
Basophils Absolute: 0 10*3/uL (ref 0.0–0.1)
Basophils Relative: 1 %
Eosinophils Absolute: 0.2 10*3/uL (ref 0.0–0.5)
Eosinophils Relative: 5 %
HCT: 35.9 % — ABNORMAL LOW (ref 39.0–52.0)
Hemoglobin: 10.7 g/dL — ABNORMAL LOW (ref 13.0–17.0)
Immature Granulocytes: 0 %
Lymphocytes Relative: 35 %
Lymphs Abs: 1.3 10*3/uL (ref 0.7–4.0)
MCH: 26.7 pg (ref 26.0–34.0)
MCHC: 29.8 g/dL — ABNORMAL LOW (ref 30.0–36.0)
MCV: 89.5 fL (ref 80.0–100.0)
Monocytes Absolute: 0.4 10*3/uL (ref 0.1–1.0)
Monocytes Relative: 10 %
Neutro Abs: 1.9 10*3/uL (ref 1.7–7.7)
Neutrophils Relative %: 49 %
Platelets: 219 10*3/uL (ref 150–400)
RBC: 4.01 MIL/uL — ABNORMAL LOW (ref 4.22–5.81)
RDW: 17.8 % — ABNORMAL HIGH (ref 11.5–15.5)
WBC: 3.9 10*3/uL — ABNORMAL LOW (ref 4.0–10.5)
nRBC: 0 % (ref 0.0–0.2)

## 2021-02-19 LAB — IRON AND TIBC
Iron: 67 ug/dL (ref 45–182)
Saturation Ratios: 25 % (ref 17.9–39.5)
TIBC: 267 ug/dL (ref 250–450)
UIBC: 200 ug/dL

## 2021-02-19 LAB — FERRITIN: Ferritin: 300 ng/mL (ref 24–336)

## 2021-02-19 MED ORDER — EPOETIN ALFA-EPBX 40000 UNIT/ML IJ SOLN
40000.0000 [IU] | Freq: Once | INTRAMUSCULAR | Status: AC
  Administered 2021-02-19: 40000 [IU] via SUBCUTANEOUS
  Filled 2021-02-19: qty 1

## 2021-02-19 NOTE — Progress Notes (Signed)
Patient tolerated Retacrit 40,000 U injection with no complaints voiced.  Site clean and dry with no bruising or swelling noted.  No complaints of pain.  Discharged with vital signs stable and no signs or symptoms of distress noted.  

## 2021-02-19 NOTE — Progress Notes (Signed)
[place], [place] [address]   CLINIC:  Medical Oncology/Hematology  PCP:  Curlene Labrum, MD [place] [phone]   REASON FOR VISIT: Follow-up for iron deficiency anemia and anemia secondary to chronic kidney disease.   CURRENT THERAPY: Retacrit injections every 2 weeks.   INTERVAL HISTORY:  Roger Hansen is a 75 y.o. male with a diagnosis of an iron deficiency anemia and anemia secondary to chronic kidney disease.  He was last seen in clinic on 12/25/2020.  Historically, he receives  Retacrit injections q 2 weeks.  He received Retacrit last on 02/05/2021.  Baseline hemoglobin is between 9-10. hemoglobin on 02/05/2021 was 10.9.  He received 40,000 units Retacrit.  Today, he reports feeling well.  Endorses chronic intermittent chest pain, leg swelling, and neck pain.  He uses his furosemide as needed.  Has chronic anxiety, depression and insomnia from PTSD in the [employer].  He is followed by the [facility] and sees a Social worker.  He reports that his appetite is around 70% of where it should be and that his energy level is approximately at 70%.  REVIEW OF SYSTEMS:   Review of Systems  Constitutional: Positive for malaise/fatigue. Negative for chills, diaphoresis, fever and weight loss.  HENT: Negative for congestion and sore throat.   Eyes: Negative.   Respiratory: Positive for shortness of breath. Negative for cough, hemoptysis, sputum production and wheezing.   Cardiovascular: Positive for chest pain. Negative for palpitations, orthopnea, claudication and leg swelling.  Gastrointestinal: Positive for constipation and diarrhea. Negative for abdominal pain, blood in stool, nausea and vomiting.  Genitourinary: Negative.   Musculoskeletal: Negative for back pain, joint pain, myalgias and neck pain.  Skin: Negative.   Neurological: Positive for dizziness, tingling and headaches. Negative for weakness.  Endo/Heme/Allergies: Negative.    Psychiatric/Behavioral: Positive for depression. The patient is nervous/anxious and has insomnia.      PAST MEDICAL/SURGICAL HISTORY:  Past Medical History:  Diagnosis Date  . Alcohol abuse 11/24/2016  . Anemia 10/11/2013  . Cardiomyopathy ([place])    a. EF 50% by echo in 2019 with NST showing no reversible ischemia and thought to be alcohol-induced  . Chronic renal disease, stage 3, moderately decreased glomerular filtration rate (GFR) between 30-59 mL/min/1.73 square meter (HCC) 12/02/2016  . Colon polyps   . GERD (gastroesophageal reflux disease)   . High cholesterol   . Hypertension   . Sinus infection    Past Surgical History:  Procedure Laterality Date  . BIOPSY  06/03/2019   Procedure: BIOPSY;  Surgeon: Rogene Houston, MD;  [facility];  Service: Endoscopy;;  esophagus  . COLONOSCOPY N/A 10/26/2013   Procedure: COLONOSCOPY;  Surgeon: Rogene Houston, MD;  [facility];  Service: Endoscopy;  Laterality: N/A;  325-moved to 1230 Ann to notify pt  . Colonoscopy with polypectomy    . COLONOSCOPY WITH PROPOFOL N/A 06/03/2019   Procedure: COLONOSCOPY WITH PROPOFOL;  Surgeon: Rogene Houston, MD;  [facility]: [facility];  Service: Endoscopy;  Laterality: N/A;  . ESOPHAGOGASTRODUODENOSCOPY (EGD) WITH PROPOFOL N/A 06/03/2019   Procedure: ESOPHAGOGASTRODUODENOSCOPY (EGD) WITH PROPOFOL;  Surgeon: Rogene Houston, MD;  [facility];  Service: Endoscopy;  Laterality: N/A;  . POLYPECTOMY  06/03/2019   Procedure: POLYPECTOMY;  Surgeon: Rogene Houston, MD;  [facility]: [facility];  Service: Endoscopy;;  colon     SOCIAL HISTORY:  Social History   Socioeconomic History  .  Marital status: Married    Spouse name: Not on file  . Number of children: Not on file  . Years of education: Not on file  . Highest education level: Not on file  Occupational History  . Not on file  Tobacco Use  . Smoking status: Former Smoker    Packs/day: 0.50    Years:  20.00    Pack years: 10.00    Quit date: 12/27/1971    Years since quitting: 49.1  . Smokeless tobacco: Never Used  Substance and Sexual Activity  . Alcohol use: Yes    Comment: 1 pint of etoh a day ( not recently)  . Drug use: No  . Sexual activity: Not on file  Other Topics Concern  . Not on file  Social History Narrative  . Not on file   Social Determinants of Health   Financial Resource Strain: Not on file  Food Insecurity: No Food Insecurity  . Worried About Charity fundraiser in the Last Year: Never true  . Ran Out of Food in the Last Year: Never true  Transportation Needs: No Transportation Needs  . Lack of Transportation (Medical): No  . Lack of Transportation (Non-Medical): No  Physical Activity: Inactive  . Days of Exercise per Week: 0 days  . Minutes of Exercise per Session: 0 min  Stress: No Stress Concern Present  . Feeling of Stress : Not at all  Social Connections: Not on file  Intimate Partner Violence: Not At Risk  . Fear of Current or Ex-Partner: No  . Emotionally Abused: No  . Physically Abused: No  . Sexually Abused: No    FAMILY HISTORY:  Family History  Problem Relation Age of Onset  . Hypertension Mother   . Colon cancer Neg Hx     CURRENT MEDICATIONS:  Outpatient Encounter Medications as of 02/19/2021  Medication Sig Note  . ALPRAZolam (XANAX) 1 MG tablet    . aspirin 81 MG tablet Take 1 tablet (81 mg total) by mouth daily.   Marland Kitchen atorvastatin (LIPITOR) 10 MG tablet Take 10 mg by mouth daily.   Marland Kitchen buPROPion (WELLBUTRIN SR) 150 MG 12 hr tablet Take 150 mg by mouth daily.    . chlorhexidine (PERIDEX) 0.12 % solution    . cholecalciferol (VITAMIN D) 1000 UNITS tablet Take 1,000 Units by mouth 2 (two) times daily.   Marland Kitchen donepezil (ARICEPT) 10 MG tablet    . Eszopiclone 3 MG TABS Take 3 mg by mouth at bedtime.    . Ferrous Sulfate (IRON) 325 (65 Fe) MG TABS TAKE ONE TABLET BY MOUTH DAILY WITH BREAKFAST. (Patient taking differently: 1 tablet 2 (two)  times a day.)   . folic acid (FOLVITE) 1 MG tablet Take 1 mg by mouth daily.   . furosemide (LASIX) 20 MG tablet Take 20 mg by mouth daily.    Marland Kitchen gabapentin (NEURONTIN) 300 MG capsule Take 300 mg by mouth 3 (three) times daily as needed. 10/21/2016: Received from: External Pharmacy  . GNP VITAMIN B-1 100 MG tablet Take 100 mg by mouth daily.    Marland Kitchen losartan (COZAAR) 50 MG tablet Take 50 mg by mouth daily.   . nitroGLYCERIN (NITROSTAT) 0.4 MG SL tablet Place 1 tablet (0.4 mg total) under the tongue every 5 (five) minutes as needed for chest pain.   Marland Kitchen omeprazole (PRILOSEC) 20 MG capsule Take 20 mg by mouth daily as needed (Acid Reflux).   . potassium chloride SA (K-DUR) 20 MEQ tablet Take 20 mEq  by mouth daily.    . QUEtiapine (SEROQUEL) 100 MG tablet    . tamsulosin (FLOMAX) 0.4 MG CAPS capsule    . traZODone (DESYREL) 100 MG tablet Take 100 mg by mouth at bedtime.   . vitamin B-12 (CYANOCOBALAMIN) 1000 MCG tablet Take 1,000 mcg by mouth daily.   . ziprasidone (GEODON) 40 MG capsule Take 40 mg by mouth 2 (two) times daily with a meal.    . [EXPIRED] epoetin alfa-epbx (RETACRIT) injection 40,000 Units     No facility-administered encounter medications on file as of 02/19/2021.    ALLERGIES:  Allergies  Allergen Reactions  . Horseradish [Armoracia Rusticana Ext (Horseradish)] Anaphylaxis     PHYSICAL EXAM:  ECOG Performance status: 1  Vitals:   02/19/21 1057  BP: 131/61  Pulse: 60  Temp: (!) 97.1 F (36.2 C)  SpO2: 100%   Filed Weights   02/19/21 1057  Weight: 203 lb (92.1 kg)    Physical Exam Constitutional:      General: He is not in acute distress.    Appearance: He is normal weight. He is not ill-appearing, toxic-appearing or diaphoretic.  HENT:     Head: Normocephalic and atraumatic.  Eyes:     General: No scleral icterus.       Right eye: No discharge.        Left eye: No discharge.     Conjunctiva/sclera: Conjunctivae normal.  Cardiovascular:     Rate and Rhythm:  Normal rate and regular rhythm.     Heart sounds: No murmur heard. No friction rub. No gallop.   Pulmonary:     Effort: Pulmonary effort is normal. No respiratory distress.     Breath sounds: Normal breath sounds. No wheezing, rhonchi or rales.  Abdominal:     General: Bowel sounds are normal. There is no distension.     Tenderness: There is no abdominal tenderness. There is no guarding.  Musculoskeletal:     Cervical back: No rigidity.     Right lower leg: No edema.     Left lower leg: No edema.  Lymphadenopathy:     Cervical: No cervical adenopathy.  Skin:    General: Skin is warm and dry.     Coloration: Skin is not jaundiced or pale.     Findings: No erythema or rash.  Neurological:     Coordination: Coordination normal.     Gait: Gait normal.  Psychiatric:        Mood and Affect: Mood normal.        Behavior: Behavior normal.        Thought Content: Thought content normal.        Judgment: Judgment normal.      LABORATORY DATA:  I have reviewed the labs as listed.  CBC    Component Value Date/Time   WBC 3.9 (L) 02/19/2021 1018   RBC 4.01 (L) 02/19/2021 1018   HGB 10.7 (L) 02/19/2021 1018   HCT 35.9 (L) 02/19/2021 1018   HCT 27.9 (L) 08/13/2016 0944   PLT 219 02/19/2021 1018   MCV 89.5 02/19/2021 1018   MCH 26.7 02/19/2021 1018   MCHC 29.8 (L) 02/19/2021 1018   RDW 17.8 (H) 02/19/2021 1018   LYMPHSABS 1.3 02/19/2021 1018   MONOABS 0.4 02/19/2021 1018   EOSABS 0.2 02/19/2021 1018   BASOSABS 0.0 02/19/2021 1018   CMP Latest Ref Rng & Units 12/25/2020 07/06/2020 05/18/2020  Glucose 70 - 99 mg/dL 100(H) 87 95  BUN 8 - 23 mg/dL  16 21 12   Creatinine 0.61 - 1.24 mg/dL 1.01 1.53(H) 0.94  Sodium 135 - 145 mmol/L 138 138 142  Potassium 3.5 - 5.1 mmol/L 4.6 4.3 3.4(L)  Chloride 98 - 111 mmol/L 104 105 102  CO2 22 - 32 mmol/L 28 25 29   Calcium 8.9 - 10.3 mg/dL 9.2 9.0 8.7(L)  Total Protein 6.5 - 8.1 g/dL 7.0 7.2 6.6  Total Bilirubin 0.3 - 1.2 mg/dL 1.1 0.5 0.7   Alkaline Phos 38 - 126 U/L 67 57 53  AST 15 - 41 U/L 13(L) 14(L) 14(L)  ALT 0 - 44 U/L 11 9 11     All questions were answered to patient's stated satisfaction. Encouraged patient to call with any new concerns or questions before his next visit to the cancer center and we can certain see him sooner, if needed.     ASSESSMENT & PLAN:  1) Iron deficiency anemia: -Last Feraheme with given on 05/29/20 -Labs from 01/08/2021 show a ferritin of 325, iron saturation 29% and hemoglobin of 11.3. - MCV is 90.0. -He is tolerating oral iron daily. -We will repeat ferritin and iron levels in 2 months.  2) Anemia secondary to chronic kidney disease: -Lab work from 02/19/2021 show hemoglobin of 10.7. -Per treatment parameters he is to receive Retacrit every 2 weeks for hemoglobin less than 11. -Proceed with Retacrit injection today. -RTC in 2 weeks for repeat labs (CBC, iron, ferritin) and possible Retacrit injection.  Disposition: -He will follow-up every 2 weeks with lab work (cbc) and possible Retacrit injection. -He will see a provider in approximately 2 months with labs (cbc, iron and ferritin) and possible Retacrit injection.   No problem-specific Assessment & Plan notes found for this encounter.  Orders placed this encounter:  No orders of the defined types were placed in this encounter.  Faythe Casa, NP 02/19/2021 11:47 AM

## 2021-03-05 ENCOUNTER — Other Ambulatory Visit: Payer: Self-pay

## 2021-03-05 ENCOUNTER — Encounter (HOSPITAL_COMMUNITY): Payer: Self-pay

## 2021-03-05 ENCOUNTER — Inpatient Hospital Stay (HOSPITAL_COMMUNITY): Payer: Medicare Other

## 2021-03-05 VITALS — BP 145/71 | HR 65 | Temp 97.1°F | Resp 18

## 2021-03-05 DIAGNOSIS — Z87891 Personal history of nicotine dependence: Secondary | ICD-10-CM | POA: Diagnosis not present

## 2021-03-05 DIAGNOSIS — D649 Anemia, unspecified: Secondary | ICD-10-CM

## 2021-03-05 DIAGNOSIS — N183 Chronic kidney disease, stage 3 unspecified: Secondary | ICD-10-CM | POA: Diagnosis not present

## 2021-03-05 DIAGNOSIS — Z7982 Long term (current) use of aspirin: Secondary | ICD-10-CM | POA: Diagnosis not present

## 2021-03-05 DIAGNOSIS — D631 Anemia in chronic kidney disease: Secondary | ICD-10-CM | POA: Diagnosis not present

## 2021-03-05 DIAGNOSIS — N2889 Other specified disorders of kidney and ureter: Secondary | ICD-10-CM

## 2021-03-05 DIAGNOSIS — D500 Iron deficiency anemia secondary to blood loss (chronic): Secondary | ICD-10-CM

## 2021-03-05 DIAGNOSIS — Z79899 Other long term (current) drug therapy: Secondary | ICD-10-CM | POA: Diagnosis not present

## 2021-03-05 LAB — CBC
HCT: 36 % — ABNORMAL LOW (ref 39.0–52.0)
Hemoglobin: 10.8 g/dL — ABNORMAL LOW (ref 13.0–17.0)
MCH: 27 pg (ref 26.0–34.0)
MCHC: 30 g/dL (ref 30.0–36.0)
MCV: 90 fL (ref 80.0–100.0)
Platelets: 241 10*3/uL (ref 150–400)
RBC: 4 MIL/uL — ABNORMAL LOW (ref 4.22–5.81)
RDW: 17.4 % — ABNORMAL HIGH (ref 11.5–15.5)
WBC: 5.8 10*3/uL (ref 4.0–10.5)
nRBC: 0 % (ref 0.0–0.2)

## 2021-03-05 MED ORDER — EPOETIN ALFA-EPBX 40000 UNIT/ML IJ SOLN
40000.0000 [IU] | Freq: Once | INTRAMUSCULAR | Status: AC
  Administered 2021-03-05: 40000 [IU] via SUBCUTANEOUS
  Filled 2021-03-05: qty 1

## 2021-03-05 NOTE — Progress Notes (Signed)
Patient presents today for Retacrit injection. Hemoglobin reviewed prior to administration. VSS. Injection tolerated without incident or complaint. See MAR for details. Patient stable during and after injection.  Patient discharged in satisfactory condition with follow up instructions. 

## 2021-03-05 NOTE — Patient Instructions (Signed)
[facility]  Discharge Instructions:   _______________________________________________________________  Thank you for choosing [facility] at [facility] to provide your oncology and hematology care.  To afford each patient quality time with our providers, please arrive at least 15 minutes before your scheduled appointment.  You need to re-schedule your appointment if you arrive 10 or more minutes late.  We strive to give you quality time with our providers, and arriving late affects you and other patients whose appointments are after yours.  Also, if you no show three or more times for appointments you may be dismissed from the clinic.  Again, thank you for choosing [facility]. Our hope is that these requests will allow you access to exceptional care and in a timely manner. _______________________________________________________________  If you have questions after your visit, please contact our office at [phone] between the hours of 8:30 a.m. and 5:00 p.m. Voicemails left after 4:30 p.m. will not be returned until the following business day. _______________________________________________________________  For prescription refill requests, have your pharmacy contact our office. _______________________________________________________________  Recommendations made by the consultant and any test results will be sent to your referring physician. _______________________________________________________________ 

## 2021-03-13 DIAGNOSIS — I5033 Acute on chronic diastolic (congestive) heart failure: Secondary | ICD-10-CM | POA: Diagnosis not present

## 2021-03-13 DIAGNOSIS — I130 Hypertensive heart and chronic kidney disease with heart failure and stage 1 through stage 4 chronic kidney disease, or unspecified chronic kidney disease: Secondary | ICD-10-CM | POA: Diagnosis not present

## 2021-03-13 DIAGNOSIS — E7849 Other hyperlipidemia: Secondary | ICD-10-CM | POA: Diagnosis not present

## 2021-03-13 DIAGNOSIS — N183 Chronic kidney disease, stage 3 unspecified: Secondary | ICD-10-CM | POA: Diagnosis not present

## 2021-03-19 ENCOUNTER — Other Ambulatory Visit: Payer: Self-pay

## 2021-03-19 ENCOUNTER — Inpatient Hospital Stay (HOSPITAL_COMMUNITY): Payer: Medicare Other | Attending: Hematology

## 2021-03-19 ENCOUNTER — Inpatient Hospital Stay (HOSPITAL_COMMUNITY): Payer: Medicare Other

## 2021-03-19 DIAGNOSIS — N183 Chronic kidney disease, stage 3 unspecified: Secondary | ICD-10-CM | POA: Insufficient documentation

## 2021-03-19 DIAGNOSIS — D500 Iron deficiency anemia secondary to blood loss (chronic): Secondary | ICD-10-CM

## 2021-03-19 DIAGNOSIS — D631 Anemia in chronic kidney disease: Secondary | ICD-10-CM | POA: Insufficient documentation

## 2021-03-19 DIAGNOSIS — D649 Anemia, unspecified: Secondary | ICD-10-CM

## 2021-03-19 LAB — CBC
HCT: 38.1 % — ABNORMAL LOW (ref 39.0–52.0)
Hemoglobin: 12.1 g/dL — ABNORMAL LOW (ref 13.0–17.0)
MCH: 28.4 pg (ref 26.0–34.0)
MCHC: 31.8 g/dL (ref 30.0–36.0)
MCV: 89.4 fL (ref 80.0–100.0)
Platelets: 155 10*3/uL (ref 150–400)
RBC: 4.26 MIL/uL (ref 4.22–5.81)
RDW: 19.5 % — ABNORMAL HIGH (ref 11.5–15.5)
WBC: 7.1 10*3/uL (ref 4.0–10.5)
nRBC: 0 % (ref 0.0–0.2)

## 2021-03-19 NOTE — Progress Notes (Signed)
Hgb 12.1 today.  No injection needed.

## 2021-04-02 ENCOUNTER — Inpatient Hospital Stay (HOSPITAL_COMMUNITY): Payer: Medicare Other

## 2021-04-02 ENCOUNTER — Other Ambulatory Visit: Payer: Self-pay

## 2021-04-02 VITALS — BP 122/61 | HR 69 | Temp 98.5°F

## 2021-04-02 DIAGNOSIS — D500 Iron deficiency anemia secondary to blood loss (chronic): Secondary | ICD-10-CM

## 2021-04-02 DIAGNOSIS — N183 Chronic kidney disease, stage 3 unspecified: Secondary | ICD-10-CM | POA: Diagnosis not present

## 2021-04-02 DIAGNOSIS — D649 Anemia, unspecified: Secondary | ICD-10-CM

## 2021-04-02 DIAGNOSIS — N2889 Other specified disorders of kidney and ureter: Secondary | ICD-10-CM

## 2021-04-02 DIAGNOSIS — D631 Anemia in chronic kidney disease: Secondary | ICD-10-CM | POA: Diagnosis not present

## 2021-04-02 LAB — CBC
HCT: 34.4 % — ABNORMAL LOW (ref 39.0–52.0)
Hemoglobin: 10.7 g/dL — ABNORMAL LOW (ref 13.0–17.0)
MCH: 29.1 pg (ref 26.0–34.0)
MCHC: 31.1 g/dL (ref 30.0–36.0)
MCV: 93.5 fL (ref 80.0–100.0)
Platelets: 255 10*3/uL (ref 150–400)
RBC: 3.68 MIL/uL — ABNORMAL LOW (ref 4.22–5.81)
RDW: 18.1 % — ABNORMAL HIGH (ref 11.5–15.5)
WBC: 5.6 10*3/uL (ref 4.0–10.5)
nRBC: 0 % (ref 0.0–0.2)

## 2021-04-02 MED ORDER — EPOETIN ALFA-EPBX 40000 UNIT/ML IJ SOLN
40000.0000 [IU] | Freq: Once | INTRAMUSCULAR | Status: AC
  Administered 2021-04-02: 40000 [IU] via SUBCUTANEOUS
  Filled 2021-04-02: qty 1

## 2021-04-02 NOTE — Progress Notes (Signed)
.  Roger Hansen presents today for injection per the provider's orders.  Retacrit administration without incident; injection site WNL; see MAR for injection details.  Patient tolerated procedure well and without incident.  No questions or complaints noted at this time. Pt discharged ambulatory in satisfactory condition accompanied by family member.

## 2021-04-16 ENCOUNTER — Encounter (HOSPITAL_COMMUNITY): Payer: Self-pay

## 2021-04-16 ENCOUNTER — Other Ambulatory Visit: Payer: Self-pay

## 2021-04-16 ENCOUNTER — Inpatient Hospital Stay (HOSPITAL_COMMUNITY): Payer: Medicare Other | Attending: Hematology

## 2021-04-16 ENCOUNTER — Inpatient Hospital Stay (HOSPITAL_COMMUNITY): Payer: Medicare Other

## 2021-04-16 VITALS — BP 148/75 | HR 70 | Temp 97.0°F | Resp 18

## 2021-04-16 DIAGNOSIS — D649 Anemia, unspecified: Secondary | ICD-10-CM

## 2021-04-16 DIAGNOSIS — D631 Anemia in chronic kidney disease: Secondary | ICD-10-CM | POA: Diagnosis not present

## 2021-04-16 DIAGNOSIS — N183 Chronic kidney disease, stage 3 unspecified: Secondary | ICD-10-CM | POA: Diagnosis present

## 2021-04-16 DIAGNOSIS — Z7982 Long term (current) use of aspirin: Secondary | ICD-10-CM | POA: Insufficient documentation

## 2021-04-16 DIAGNOSIS — Z87891 Personal history of nicotine dependence: Secondary | ICD-10-CM | POA: Diagnosis not present

## 2021-04-16 DIAGNOSIS — Z79899 Other long term (current) drug therapy: Secondary | ICD-10-CM | POA: Diagnosis not present

## 2021-04-16 DIAGNOSIS — D500 Iron deficiency anemia secondary to blood loss (chronic): Secondary | ICD-10-CM

## 2021-04-16 LAB — CBC
HCT: 32.9 % — ABNORMAL LOW (ref 39.0–52.0)
Hemoglobin: 10.1 g/dL — ABNORMAL LOW (ref 13.0–17.0)
MCH: 29 pg (ref 26.0–34.0)
MCHC: 30.7 g/dL (ref 30.0–36.0)
MCV: 94.5 fL (ref 80.0–100.0)
Platelets: 200 10*3/uL (ref 150–400)
RBC: 3.48 MIL/uL — ABNORMAL LOW (ref 4.22–5.81)
RDW: 18.6 % — ABNORMAL HIGH (ref 11.5–15.5)
WBC: 3.6 10*3/uL — ABNORMAL LOW (ref 4.0–10.5)
nRBC: 0 % (ref 0.0–0.2)

## 2021-04-16 MED ORDER — EPOETIN ALFA-EPBX 40000 UNIT/ML IJ SOLN
40000.0000 [IU] | Freq: Once | INTRAMUSCULAR | Status: AC
  Administered 2021-04-16: 40000 [IU] via SUBCUTANEOUS
  Filled 2021-04-16: qty 1

## 2021-04-16 NOTE — Patient Instructions (Signed)
[place]    Discharge Instructions: Thank you for choosing [facility] to provide your oncology and hematology care.  If you have a lab appointment with the [facility], please come in thru the Main Entrance and check in at the main information desk.  We strive to give you quality time with your provider. You may need to reschedule your appointment if you arrive late (15 or more minutes).  Arriving late affects you and other patients whose appointments are after yours.  Also, if you miss three or more appointments without notifying the office, you may be dismissed from the clinic at the provider's discretion.      For prescription refill requests, have your pharmacy contact our office and allow 72 hours for refills to be completed.    Today you received the following injection:  Retacrit 40,000 units.     To help prevent nausea and vomiting after your treatment, we encourage you to take your nausea medication as directed.  BELOW ARE SYMPTOMS THAT SHOULD BE REPORTED IMMEDIATELY: . *FEVER GREATER THAN 100.4 F (38 C) OR HIGHER . *CHILLS OR SWEATING . *NAUSEA AND VOMITING THAT IS NOT CONTROLLED WITH YOUR NAUSEA MEDICATION . *UNUSUAL SHORTNESS OF BREATH . *UNUSUAL BRUISING OR BLEEDING . *URINARY PROBLEMS (pain or burning when urinating, or frequent urination) . *BOWEL PROBLEMS (unusual diarrhea, constipation, pain near the anus) . TENDERNESS IN MOUTH AND THROAT WITH OR WITHOUT PRESENCE OF ULCERS (sore throat, sores in mouth, or a toothache) . UNUSUAL RASH, SWELLING OR PAIN  . UNUSUAL VAGINAL DISCHARGE OR ITCHING   Items with * indicate a potential emergency and should be followed up as soon as possible or go to the Emergency Department if any problems should occur.   Should you have questions after your visit or need to cancel or reschedule your appointment, please contact [facility] [phone]  and follow the prompts.  Office hours are 8:00  a.m. to 4:30 p.m. Monday - Friday. Please note that voicemails left after 4:00 p.m. may not be returned until the following business day.  We are closed weekends and major holidays. You have access to a nurse at all times for urgent questions. Please call the main number to the clinic [phone] and follow the prompts.  For any non-urgent questions, you may also contact your provider using MyChart. We now offer e-Visits for anyone 74 and older to request care online for non-urgent symptoms. For details visit [email or web address].   Also download the MyChart app! Go to the app store, search "MyChart", open the app, select [facility], and log in with your MyChart username and password.  Due to Covid, a mask is required upon entering the hospital/clinic. If you do not have a mask, one will be given to you upon arrival. For doctor visits, patients may have 1 support person aged 56 or older with them. For treatment visits, patients cannot have anyone with them due to current Covid guidelines and our immunocompromised population.

## 2021-04-16 NOTE — Progress Notes (Signed)
Roger Hansen presents today for injection per the provider's orders.  Retacrit administration without incident; injection site WNL; see MAR for injection details.  Patient tolerated procedure well and without incident.  No questions or complaints noted at this time.  Discharged ambulatory in stable condition.

## 2021-04-25 DIAGNOSIS — F4312 Post-traumatic stress disorder, chronic: Secondary | ICD-10-CM | POA: Diagnosis not present

## 2021-04-25 DIAGNOSIS — F332 Major depressive disorder, recurrent severe without psychotic features: Secondary | ICD-10-CM | POA: Diagnosis not present

## 2021-04-29 NOTE — Progress Notes (Signed)
Roger Hansen, Roger Hansen   CLINIC:  Medical Oncology/Hematology  PCP:  Curlene Labrum, MD [address] [place] [address]  [phone]  REASON FOR VISIT:  Follow-up for iron deficiency anemia and anemia secondary to chronic kidney disease  PRIOR THERAPY: none  CURRENT THERAPY: Retacrit injections every 2 weeks  INTERVAL HISTORY:  Roger Hansen, a 75 y.o. male, returns for routine follow-up for his iron deficiency anemia and anemia secondary to chronic kidney disease. Roger Hansen was last seen on 02/19/2021.  Roger Hansen reports feeling well. Roger Hansen reports mild pain in both ankles today. Roger Hansen has not had light-headedness after receiving injections. Roger Hansen denies bleeding in urine, stool, nose. Roger Hansen is taking 325 mg of iron twice daily. Roger Hansen experiences occasional tolerable constipation from the iron medication. Roger Hansen complains of chest pains. Roger Hansen denies history of heart attacks.  REVIEW OF SYSTEMS:  Review of Systems  Constitutional: Negative for appetite change and fatigue.  Cardiovascular: Positive for chest pain and palpitations (panic attack).  Gastrointestinal: Positive for constipation, diarrhea and nausea.  Musculoskeletal: Positive for arthralgias (R ankle 7/10).  Neurological: Positive for light-headedness and numbness (feet).  Psychiatric/Behavioral: Positive for sleep disturbance.  All other systems reviewed and are negative.   PAST MEDICAL/SURGICAL HISTORY:  Past Medical History:  Diagnosis Date  . Alcohol abuse 11/24/2016  . Anemia 10/11/2013  . Cardiomyopathy ([place])    a. EF 50% by echo in 2019 with NST showing no reversible ischemia and thought to be alcohol-induced  . Chronic renal disease, stage 3, moderately decreased glomerular filtration rate (GFR) between 30-59 mL/min/1.73 square meter (HCC) 12/02/2016  . Colon polyps   . GERD (gastroesophageal reflux disease)   . High cholesterol   . Hypertension   . Sinus infection    Past  Surgical History:  Procedure Laterality Date  . BIOPSY  06/03/2019   Procedure: BIOPSY;  Surgeon: Rogene Houston, MD;  [facility]: [facility];  Service: Endoscopy;;  esophagus  . COLONOSCOPY N/A 10/26/2013   Procedure: COLONOSCOPY;  Surgeon: Rogene Houston, MD;  [facility]: [facility];  Service: Endoscopy;  Laterality: N/A;  325-moved to 1230 Ann to notify pt  . Colonoscopy with polypectomy    . COLONOSCOPY WITH PROPOFOL N/A 06/03/2019   Procedure: COLONOSCOPY WITH PROPOFOL;  Surgeon: Rogene Houston, MD;  [facility];  Service: Endoscopy;  Laterality: N/A;  . ESOPHAGOGASTRODUODENOSCOPY (EGD) WITH PROPOFOL N/A 06/03/2019   Procedure: ESOPHAGOGASTRODUODENOSCOPY (EGD) WITH PROPOFOL;  Surgeon: Rogene Houston, MD;  [facility];  Service: Endoscopy;  Laterality: N/A;  . POLYPECTOMY  06/03/2019   Procedure: POLYPECTOMY;  Surgeon: Rogene Houston, MD;  [facility];  Service: Endoscopy;;  colon    SOCIAL HISTORY:  Social History   Socioeconomic History  . Marital status: Married    Spouse name: Not on file  . Number of children: Not on file  . Years of education: Not on file  . Highest education level: Not on file  Occupational History  . Not on file  Tobacco Use  . Smoking status: Former Smoker    Packs/day: 0.50    Years: 20.00    Pack years: 10.00    Quit date: 12/27/1971    Years since quitting: 49.3  . Smokeless tobacco: Never Used  Substance and Sexual Activity  . Alcohol use: Yes    Comment: 1 pint of etoh a day ( not recently)  .  Drug use: No  . Sexual activity: Not on file  Other Topics Concern  . Not on file  Social History Narrative  . Not on file   Social Determinants of Health   Financial Resource Strain: Not on file  Food Insecurity: No Food Insecurity  . Worried About Charity fundraiser in the Last Year: Never true  . Ran Out of Food in the Last Year: Never true  Transportation Needs: No Transportation Needs  . Lack of  Transportation (Medical): No  . Lack of Transportation (Non-Medical): No  Physical Activity: Inactive  . Days of Exercise per Week: 0 days  . Minutes of Exercise per Session: 0 min  Stress: No Stress Concern Present  . Feeling of Stress : Not at all  Social Connections: Not on file  Intimate Partner Violence: Not At Risk  . Fear of Current or Ex-Partner: No  . Emotionally Abused: No  . Physically Abused: No  . Sexually Abused: No    FAMILY HISTORY:  Family History  Problem Relation Age of Onset  . Hypertension Mother   . Colon cancer Neg Hx     CURRENT MEDICATIONS:  Current Outpatient Medications  Medication Sig Dispense Refill  . ALPRAZolam (XANAX) 1 MG tablet     . aspirin 81 MG tablet Take 1 tablet (81 mg total) by mouth daily. 30 tablet   . atorvastatin (LIPITOR) 10 MG tablet Take 10 mg by mouth daily.    Marland Kitchen buPROPion (WELLBUTRIN SR) 150 MG 12 hr tablet Take 150 mg by mouth daily.     . chlorhexidine (PERIDEX) 0.12 % solution     . cholecalciferol (VITAMIN D) 1000 UNITS tablet Take 1,000 Units by mouth 2 (two) times daily.    Marland Kitchen donepezil (ARICEPT) 10 MG tablet     . Eszopiclone 3 MG TABS Take 3 mg by mouth at bedtime.     . Ferrous Sulfate (IRON) 325 (65 Fe) MG TABS TAKE ONE TABLET BY MOUTH DAILY WITH BREAKFAST. (Patient taking differently: 1 tablet 2 (two) times a day.) 30 tablet 3  . folic acid (FOLVITE) 1 MG tablet Take 1 mg by mouth daily.    . furosemide (LASIX) 20 MG tablet Take 20 mg by mouth daily.     Marland Kitchen gabapentin (NEURONTIN) 300 MG capsule Take 300 mg by mouth 3 (three) times daily as needed.    Marland Kitchen GNP VITAMIN B-1 100 MG tablet Take 100 mg by mouth daily.     Marland Kitchen losartan (COZAAR) 50 MG tablet Take 50 mg by mouth daily.    . nitroGLYCERIN (NITROSTAT) 0.4 MG SL tablet Place 1 tablet (0.4 mg total) under the tongue every 5 (five) minutes as needed for chest pain. 25 tablet 3  . omeprazole (PRILOSEC) 20 MG capsule Take 20 mg by mouth daily as needed (Acid Reflux).    .  potassium chloride SA (K-DUR) 20 MEQ tablet Take 20 mEq by mouth daily.     . QUEtiapine (SEROQUEL) 100 MG tablet     . tamsulosin (FLOMAX) 0.4 MG CAPS capsule     . traZODone (DESYREL) 100 MG tablet Take 100 mg by mouth at bedtime.    . vitamin B-12 (CYANOCOBALAMIN) 1000 MCG tablet Take 1,000 mcg by mouth daily.    . ziprasidone (GEODON) 40 MG capsule Take 40 mg by mouth 2 (two) times daily with a meal.      No current facility-administered medications for this visit.    ALLERGIES:  Allergies  Allergen Reactions  .  Horseradish [Armoracia Rusticana Ext (Horseradish)] Anaphylaxis    PHYSICAL EXAM:  Performance status (ECOG): 1 - Symptomatic but completely ambulatory  There were no vitals filed for this visit. Wt Readings from Last 3 Encounters:  02/19/21 203 lb (92.1 kg)  12/25/20 195 lb 8 oz (88.7 kg)  12/05/20 184 lb (83.5 kg)   Physical Exam Vitals reviewed.  Constitutional:      Appearance: Normal appearance. Roger Hansen is obese.  Cardiovascular:     Rate and Rhythm: Normal rate and regular rhythm.     Pulses: Normal pulses.     Heart sounds: Normal heart sounds.  Pulmonary:     Effort: Pulmonary effort is normal.     Breath sounds: Normal breath sounds.  Chest:  Breasts:     Right: No axillary adenopathy or supraclavicular adenopathy.     Left: No axillary adenopathy or supraclavicular adenopathy.    Musculoskeletal:     Right lower leg: Edema (trace) present.     Left lower leg: Edema (trace) present.  Lymphadenopathy:     Cervical:     Right cervical: No superficial cervical adenopathy.    Left cervical: No superficial cervical adenopathy.     Upper Body:     Right upper body: No supraclavicular, axillary or pectoral adenopathy.     Left upper body: No supraclavicular, axillary or pectoral adenopathy.  Neurological:     General: No focal deficit present.     Mental Status: Roger Hansen is alert and oriented to person, place, and time.  Psychiatric:        Mood and Affect:  Mood normal.        Behavior: Behavior normal.     LABORATORY DATA:  I have reviewed the labs as listed.  CBC Latest Ref Rng & Units 04/16/2021 04/02/2021 03/19/2021  WBC 4.0 - 10.5 K/uL 3.6(L) 5.6 7.1  Hemoglobin 13.0 - 17.0 g/dL 10.1(L) 10.7(L) 12.1(L)  Hematocrit 39.0 - 52.0 % 32.9(L) 34.4(L) 38.1(L)  Platelets 150 - 400 K/uL 200 255 155   CMP Latest Ref Rng & Units 12/25/2020 07/06/2020 05/18/2020  Glucose 70 - 99 mg/dL 100(H) 87 95  BUN 8 - 23 mg/dL 16 21 12   Creatinine 0.61 - 1.24 mg/dL 1.01 1.53(H) 0.94  Sodium 135 - 145 mmol/L 138 138 142  Potassium 3.5 - 5.1 mmol/L 4.6 4.3 3.4(L)  Chloride 98 - 111 mmol/L 104 105 102  CO2 22 - 32 mmol/L 28 25 29   Calcium 8.9 - 10.3 mg/dL 9.2 9.0 8.7(L)  Total Protein 6.5 - 8.1 g/dL 7.0 7.2 6.6  Total Bilirubin 0.3 - 1.2 mg/dL 1.1 0.5 0.7  Alkaline Phos 38 - 126 U/L 67 57 53  AST 15 - 41 U/L 13(L) 14(L) 14(L)  ALT 0 - 44 U/L 11 9 11       Component Value Date/Time   RBC 3.48 (L) 04/16/2021 0910   MCV 94.5 04/16/2021 0910   MCH 29.0 04/16/2021 0910   MCHC 30.7 04/16/2021 0910   RDW 18.6 (H) 04/16/2021 0910   LYMPHSABS 1.3 02/19/2021 1018   MONOABS 0.4 02/19/2021 1018   EOSABS 0.2 02/19/2021 1018   BASOSABS 0.0 02/19/2021 1018    DIAGNOSTIC IMAGING:  I have independently reviewed the scans and discussed with the patient. No results found.   ASSESSMENT:   1. Anemia secondary to chronic kidney disease: - Roger Hansen has been receiving intermittent Epogen and parenteral iron therapy since June 2020. - Last colonoscopy and EGD on 06/03/2019   PLAN:  1.  Normocytic  anemia: - Roger Hansen is tolerating Epogen 40,000 units every 2 weeks very well. - His iron panel on 02/19/2021 showed ferritin 300 and percent saturation 25. - Labs today showed hemoglobin 9.6 with MCV 95. - Roger Hansen will continue iron tablets 325 mg twice daily.  Roger Hansen reported occasional dark stools but denied any bleeding per rectum. - We will continue Epogen at the same dose every 2 weeks.  We  will repeat his labs in 6 weeks and see him back in 8 weeks for follow-up.  We will also check SPEP prior to next visit.   Orders placed this encounter:  No orders of the defined types were placed in this encounter.    Derek Jack, MD [place] [phone]   I, Thana Ates, am acting as a scribe for Dr. Derek Jack.  I, Derek Jack MD, have reviewed the above documentation for accuracy and completeness, and I agree with the above.

## 2021-04-30 ENCOUNTER — Inpatient Hospital Stay (HOSPITAL_COMMUNITY): Payer: Medicare Other

## 2021-04-30 ENCOUNTER — Other Ambulatory Visit: Payer: Self-pay

## 2021-04-30 ENCOUNTER — Inpatient Hospital Stay (HOSPITAL_BASED_OUTPATIENT_CLINIC_OR_DEPARTMENT_OTHER): Payer: Medicare Other | Admitting: Hematology

## 2021-04-30 VITALS — BP 151/81 | HR 73 | Temp 97.3°F | Resp 18 | Wt 195.5 lb

## 2021-04-30 DIAGNOSIS — R5383 Other fatigue: Secondary | ICD-10-CM

## 2021-04-30 DIAGNOSIS — Z7982 Long term (current) use of aspirin: Secondary | ICD-10-CM | POA: Diagnosis not present

## 2021-04-30 DIAGNOSIS — Z79899 Other long term (current) drug therapy: Secondary | ICD-10-CM | POA: Diagnosis not present

## 2021-04-30 DIAGNOSIS — D649 Anemia, unspecified: Secondary | ICD-10-CM | POA: Diagnosis not present

## 2021-04-30 DIAGNOSIS — D539 Nutritional anemia, unspecified: Secondary | ICD-10-CM | POA: Diagnosis not present

## 2021-04-30 DIAGNOSIS — D500 Iron deficiency anemia secondary to blood loss (chronic): Secondary | ICD-10-CM

## 2021-04-30 DIAGNOSIS — N183 Chronic kidney disease, stage 3 unspecified: Secondary | ICD-10-CM

## 2021-04-30 DIAGNOSIS — Z87891 Personal history of nicotine dependence: Secondary | ICD-10-CM | POA: Diagnosis not present

## 2021-04-30 DIAGNOSIS — D631 Anemia in chronic kidney disease: Secondary | ICD-10-CM | POA: Diagnosis not present

## 2021-04-30 DIAGNOSIS — N2889 Other specified disorders of kidney and ureter: Secondary | ICD-10-CM

## 2021-04-30 LAB — CBC
HCT: 30.2 % — ABNORMAL LOW (ref 39.0–52.0)
Hemoglobin: 9.6 g/dL — ABNORMAL LOW (ref 13.0–17.0)
MCH: 30.4 pg (ref 26.0–34.0)
MCHC: 31.8 g/dL (ref 30.0–36.0)
MCV: 95.6 fL (ref 80.0–100.0)
Platelets: 161 10*3/uL (ref 150–400)
RBC: 3.16 MIL/uL — ABNORMAL LOW (ref 4.22–5.81)
RDW: 19.2 % — ABNORMAL HIGH (ref 11.5–15.5)
WBC: 3.9 10*3/uL — ABNORMAL LOW (ref 4.0–10.5)
nRBC: 0 % (ref 0.0–0.2)

## 2021-04-30 MED ORDER — EPOETIN ALFA-EPBX 40000 UNIT/ML IJ SOLN
40000.0000 [IU] | Freq: Once | INTRAMUSCULAR | Status: AC
  Administered 2021-04-30: 40000 [IU] via SUBCUTANEOUS
  Filled 2021-04-30: qty 1

## 2021-04-30 NOTE — Patient Instructions (Signed)
[facility]  Discharge Instructions: Thank you for choosing [facility] to provide your oncology and hematology care.  If you have a lab appointment with the Cancer Center, please come in thru the Main Entrance and check in at the main information desk.  Wear comfortable clothing and clothing appropriate for easy access to any Portacath or PICC line.   We strive to give you quality time with your provider. You may need to reschedule your appointment if you arrive late (15 or more minutes).  Arriving late affects you and other patients whose appointments are after yours.  Also, if you miss three or more appointments without notifying the office, you may be dismissed from the clinic at the provider's discretion.      For prescription refill requests, have your pharmacy contact our office and allow 72 hours for refills to be completed.    Today you received the following chemotherapy and/or immunotherapy agents Retacrit injection      To help prevent nausea and vomiting after your treatment, we encourage you to take your nausea medication as directed.  BELOW ARE SYMPTOMS THAT SHOULD BE REPORTED IMMEDIATELY: *FEVER GREATER THAN 100.4 F (38 C) OR HIGHER *CHILLS OR SWEATING *NAUSEA AND VOMITING THAT IS NOT CONTROLLED WITH YOUR NAUSEA MEDICATION *UNUSUAL SHORTNESS OF BREATH *UNUSUAL BRUISING OR BLEEDING *URINARY PROBLEMS (pain or burning when urinating, or frequent urination) *BOWEL PROBLEMS (unusual diarrhea, constipation, pain near the anus) TENDERNESS IN MOUTH AND THROAT WITH OR WITHOUT PRESENCE OF ULCERS (sore throat, sores in mouth, or a toothache) UNUSUAL RASH, SWELLING OR PAIN  UNUSUAL VAGINAL DISCHARGE OR ITCHING   Items with * indicate a potential emergency and should be followed up as soon as possible or go to the Emergency Department if any problems should occur.  Please show the CHEMOTHERAPY ALERT CARD or IMMUNOTHERAPY ALERT CARD at check-in to the  Emergency Department and triage nurse.  Should you have questions after your visit or need to cancel or reschedule your appointment, please contact [facility] [phone]  and follow the prompts.  Office hours are 8:00 a.m. to 4:30 p.m. Monday - Friday. Please note that voicemails left after 4:00 p.m. may not be returned until the following business day.  We are closed weekends and major holidays. You have access to a nurse at all times for urgent questions. Please call the main number to the clinic [phone] and follow the prompts.  For any non-urgent questions, you may also contact your provider using MyChart. We now offer e-Visits for anyone 18 and older to request care online for non-urgent symptoms. For details visit [email or web address].   Also download the MyChart app! Go to the app store, search "MyChart", open the app, select [facility], and log in with your MyChart username and password.  Due to Covid, a mask is required upon entering the hospital/clinic. If you do not have a mask, one will be given to you upon arrival. For doctor visits, patients may have 1 support person aged 18 or older with them. For treatment visits, patients cannot have anyone with them due to current Covid guidelines and our immunocompromised population.  

## 2021-04-30 NOTE — Progress Notes (Signed)
Roger Hansen presents today for Retacrit injection per the provider's orders.  HGB noted to be 9.6.  Stable during administration without incident; injection site WNL; see MAR for injection details.  Patient tolerated procedure well and without incident.  No questions or complaints noted at this time.

## 2021-04-30 NOTE — Patient Instructions (Signed)
[facility] at [facility] Discharge Instructions  You were seen today by Dr. Katragadda. He went over your recent results. Dr. Katragadda will see you back in 8 weeks for labs and follow up.   Thank you for choosing [facility] at [facility] to provide your oncology and hematology care.  To afford each patient quality time with our provider, please arrive at least 15 minutes before your scheduled appointment time.   If you have a lab appointment with the Cancer Center please come in thru the Main Entrance and check in at the main information desk  You need to re-schedule your appointment should you arrive 10 or more minutes late.  We strive to give you quality time with our providers, and arriving late affects you and other patients whose appointments are after yours.  Also, if you no show three or more times for appointments you may be dismissed from the clinic at the providers discretion.     Again, thank you for choosing [facility].  Our hope is that these requests will decrease the amount of time that you wait before being seen by our physicians.       _____________________________________________________________  Should you have questions after your visit to [facility], please contact our office at [phone] between the hours of 8:00 a.m. and 4:30 p.m.  Voicemails left after 4:00 p.m. will not be returned until the following business day.  For prescription refill requests, have your pharmacy contact our office and allow 72 hours.    Cancer Center Support Programs:   > Cancer Support Group  2nd Tuesday of the month 1pm-2pm, Journey Room   

## 2021-05-14 ENCOUNTER — Inpatient Hospital Stay (HOSPITAL_COMMUNITY): Payer: Medicare Other

## 2021-05-14 ENCOUNTER — Other Ambulatory Visit: Payer: Self-pay

## 2021-05-14 VITALS — BP 147/53 | HR 69 | Resp 18

## 2021-05-14 DIAGNOSIS — D631 Anemia in chronic kidney disease: Secondary | ICD-10-CM | POA: Diagnosis not present

## 2021-05-14 DIAGNOSIS — D649 Anemia, unspecified: Secondary | ICD-10-CM

## 2021-05-14 DIAGNOSIS — N183 Chronic kidney disease, stage 3 unspecified: Secondary | ICD-10-CM

## 2021-05-14 DIAGNOSIS — Z79899 Other long term (current) drug therapy: Secondary | ICD-10-CM | POA: Diagnosis not present

## 2021-05-14 DIAGNOSIS — R5383 Other fatigue: Secondary | ICD-10-CM

## 2021-05-14 DIAGNOSIS — Z87891 Personal history of nicotine dependence: Secondary | ICD-10-CM | POA: Diagnosis not present

## 2021-05-14 DIAGNOSIS — D539 Nutritional anemia, unspecified: Secondary | ICD-10-CM

## 2021-05-14 DIAGNOSIS — Z7982 Long term (current) use of aspirin: Secondary | ICD-10-CM | POA: Diagnosis not present

## 2021-05-14 LAB — CBC WITH DIFFERENTIAL/PLATELET
Abs Immature Granulocytes: 0.02 10*3/uL (ref 0.00–0.07)
Basophils Absolute: 0 10*3/uL (ref 0.0–0.1)
Basophils Relative: 1 %
Eosinophils Absolute: 0.1 10*3/uL (ref 0.0–0.5)
Eosinophils Relative: 3 %
HCT: 33.3 % — ABNORMAL LOW (ref 39.0–52.0)
Hemoglobin: 10.2 g/dL — ABNORMAL LOW (ref 13.0–17.0)
Immature Granulocytes: 1 %
Lymphocytes Relative: 36 %
Lymphs Abs: 1.5 10*3/uL (ref 0.7–4.0)
MCH: 29.7 pg (ref 26.0–34.0)
MCHC: 30.6 g/dL (ref 30.0–36.0)
MCV: 97.1 fL (ref 80.0–100.0)
Monocytes Absolute: 0.6 10*3/uL (ref 0.1–1.0)
Monocytes Relative: 13 %
Neutro Abs: 2 10*3/uL (ref 1.7–7.7)
Neutrophils Relative %: 46 %
Platelets: 193 10*3/uL (ref 150–400)
RBC: 3.43 MIL/uL — ABNORMAL LOW (ref 4.22–5.81)
RDW: 19.2 % — ABNORMAL HIGH (ref 11.5–15.5)
WBC: 4.2 10*3/uL (ref 4.0–10.5)
nRBC: 0 % (ref 0.0–0.2)

## 2021-05-14 LAB — IRON AND TIBC
Iron: 64 ug/dL (ref 45–182)
Saturation Ratios: 28 % (ref 17.9–39.5)
TIBC: 228 ug/dL — ABNORMAL LOW (ref 250–450)
UIBC: 164 ug/dL

## 2021-05-14 LAB — FOLATE: Folate: 14.4 ng/mL (ref >5.9)

## 2021-05-14 LAB — VITAMIN B12: Vitamin B-12: 449 pg/mL (ref 180–914)

## 2021-05-14 LAB — FERRITIN: Ferritin: 514 ng/mL — ABNORMAL HIGH (ref 24–336)

## 2021-05-14 MED ORDER — EPOETIN ALFA-EPBX 40000 UNIT/ML IJ SOLN
INTRAMUSCULAR | Status: AC
  Filled 2021-05-14: qty 1

## 2021-05-14 MED ORDER — EPOETIN ALFA-EPBX 40000 UNIT/ML IJ SOLN
40000.0000 [IU] | Freq: Once | INTRAMUSCULAR | Status: AC
  Administered 2021-05-14: 40000 [IU] via SUBCUTANEOUS

## 2021-05-14 NOTE — Progress Notes (Signed)
Patient tolerated Retacrit 40,000 unit injection with no complaints voiced.  Site clean and dry with no bruising or swelling noted.  No complaints of pain.  Discharged with vital signs stable and no signs or symptoms of distress noted.  

## 2021-05-16 LAB — PROTEIN ELECTROPHORESIS, SERUM
A/G Ratio: 1.6 (ref 0.7–1.7)
Albumin ELP: 4 g/dL (ref 2.9–4.4)
Alpha-1-Globulin: 0.2 g/dL (ref 0.0–0.4)
Alpha-2-Globulin: 0.5 g/dL (ref 0.4–1.0)
Beta Globulin: 0.8 g/dL (ref 0.7–1.3)
Gamma Globulin: 1 g/dL (ref 0.4–1.8)
Globulin, Total: 2.5 g/dL (ref 2.2–3.9)
Total Protein ELP: 6.5 g/dL (ref 6.0–8.5)

## 2021-05-28 ENCOUNTER — Inpatient Hospital Stay (HOSPITAL_COMMUNITY): Payer: Medicare Other | Attending: Hematology

## 2021-05-28 ENCOUNTER — Inpatient Hospital Stay (HOSPITAL_COMMUNITY): Payer: Medicare Other

## 2021-05-28 ENCOUNTER — Other Ambulatory Visit: Payer: Self-pay

## 2021-05-28 VITALS — BP 114/67 | HR 79 | Resp 19

## 2021-05-28 DIAGNOSIS — D649 Anemia, unspecified: Secondary | ICD-10-CM

## 2021-05-28 DIAGNOSIS — D500 Iron deficiency anemia secondary to blood loss (chronic): Secondary | ICD-10-CM

## 2021-05-28 DIAGNOSIS — D631 Anemia in chronic kidney disease: Secondary | ICD-10-CM | POA: Diagnosis not present

## 2021-05-28 DIAGNOSIS — N183 Chronic kidney disease, stage 3 unspecified: Secondary | ICD-10-CM

## 2021-05-28 DIAGNOSIS — D529 Folate deficiency anemia, unspecified: Secondary | ICD-10-CM

## 2021-05-28 DIAGNOSIS — E538 Deficiency of other specified B group vitamins: Secondary | ICD-10-CM

## 2021-05-28 LAB — COMPREHENSIVE METABOLIC PANEL
ALT: 18 U/L (ref 0–44)
AST: 25 U/L (ref 15–41)
Albumin: 3.8 g/dL (ref 3.5–5.0)
Alkaline Phosphatase: 72 U/L (ref 38–126)
Anion gap: 9 (ref 5–15)
BUN: 12 mg/dL (ref 8–23)
CO2: 34 mmol/L — ABNORMAL HIGH (ref 22–32)
Calcium: 8.4 mg/dL — ABNORMAL LOW (ref 8.9–10.3)
Chloride: 96 mmol/L — ABNORMAL LOW (ref 98–111)
Creatinine, Ser: 1.21 mg/dL (ref 0.61–1.24)
GFR, Estimated: >60 mL/min (ref >60)
Glucose, Bld: 106 mg/dL — ABNORMAL HIGH (ref 70–99)
Potassium: 3.7 mmol/L (ref 3.5–5.1)
Sodium: 139 mmol/L (ref 135–145)
Total Bilirubin: 1.9 mg/dL — ABNORMAL HIGH (ref 0.3–1.2)
Total Protein: 6.8 g/dL (ref 6.5–8.1)

## 2021-05-28 LAB — CBC
HCT: 33.3 % — ABNORMAL LOW (ref 39.0–52.0)
Hemoglobin: 10.7 g/dL — ABNORMAL LOW (ref 13.0–17.0)
MCH: 30.2 pg (ref 26.0–34.0)
MCHC: 32.1 g/dL (ref 30.0–36.0)
MCV: 94.1 fL (ref 80.0–100.0)
Platelets: 172 10*3/uL (ref 150–400)
RBC: 3.54 MIL/uL — ABNORMAL LOW (ref 4.22–5.81)
RDW: 19.3 % — ABNORMAL HIGH (ref 11.5–15.5)
WBC: 4.2 10*3/uL (ref 4.0–10.5)
nRBC: 0 % (ref 0.0–0.2)

## 2021-05-28 LAB — VITAMIN B12: Vitamin B-12: 366 pg/mL (ref 180–914)

## 2021-05-28 LAB — FOLATE: Folate: 11.2 ng/mL (ref >5.9)

## 2021-05-28 LAB — IRON AND TIBC
Iron: 105 ug/dL (ref 45–182)
Saturation Ratios: 50 % — ABNORMAL HIGH (ref 17.9–39.5)
TIBC: 210 ug/dL — ABNORMAL LOW (ref 250–450)
UIBC: 105 ug/dL

## 2021-05-28 LAB — FERRITIN: Ferritin: 731 ng/mL — ABNORMAL HIGH (ref 24–336)

## 2021-05-28 MED ORDER — EPOETIN ALFA-EPBX 40000 UNIT/ML IJ SOLN
40000.0000 [IU] | Freq: Once | INTRAMUSCULAR | Status: AC
  Administered 2021-05-28: 40000 [IU] via SUBCUTANEOUS
  Filled 2021-05-28: qty 1

## 2021-05-28 NOTE — Progress Notes (Signed)
Patient tolerated Retacrit 40,000 unit injection with no complaints voiced.  Site clean and dry with no bruising or swelling noted.  No complaints of pain.  Discharged with vital signs stable and no signs or symptoms of distress noted.  

## 2021-05-29 LAB — PROTEIN ELECTROPHORESIS, SERUM
A/G Ratio: 1.4 (ref 0.7–1.7)
Albumin ELP: 3.8 g/dL (ref 2.9–4.4)
Alpha-1-Globulin: 0.2 g/dL (ref 0.0–0.4)
Alpha-2-Globulin: 0.5 g/dL (ref 0.4–1.0)
Beta Globulin: 0.9 g/dL (ref 0.7–1.3)
Gamma Globulin: 1.1 g/dL (ref 0.4–1.8)
Globulin, Total: 2.7 g/dL (ref 2.2–3.9)
Total Protein ELP: 6.5 g/dL (ref 6.0–8.5)

## 2021-06-04 DIAGNOSIS — Z23 Encounter for immunization: Secondary | ICD-10-CM | POA: Diagnosis not present

## 2021-06-04 DIAGNOSIS — S81812A Laceration without foreign body, left lower leg, initial encounter: Secondary | ICD-10-CM | POA: Diagnosis not present

## 2021-06-04 DIAGNOSIS — W269XXA Contact with unspecified sharp object(s), initial encounter: Secondary | ICD-10-CM | POA: Diagnosis not present

## 2021-06-10 ENCOUNTER — Other Ambulatory Visit (HOSPITAL_COMMUNITY): Payer: Self-pay | Admitting: *Deleted

## 2021-06-10 DIAGNOSIS — N183 Chronic kidney disease, stage 3 unspecified: Secondary | ICD-10-CM

## 2021-06-10 DIAGNOSIS — D500 Iron deficiency anemia secondary to blood loss (chronic): Secondary | ICD-10-CM

## 2021-06-10 DIAGNOSIS — R5383 Other fatigue: Secondary | ICD-10-CM

## 2021-06-10 DIAGNOSIS — D539 Nutritional anemia, unspecified: Secondary | ICD-10-CM

## 2021-06-10 DIAGNOSIS — N2889 Other specified disorders of kidney and ureter: Secondary | ICD-10-CM

## 2021-06-10 DIAGNOSIS — D649 Anemia, unspecified: Secondary | ICD-10-CM

## 2021-06-11 ENCOUNTER — Inpatient Hospital Stay (HOSPITAL_COMMUNITY): Payer: Medicare Other

## 2021-06-11 ENCOUNTER — Other Ambulatory Visit: Payer: Self-pay

## 2021-06-11 VITALS — BP 138/67 | HR 66 | Temp 97.0°F | Resp 19

## 2021-06-11 DIAGNOSIS — D649 Anemia, unspecified: Secondary | ICD-10-CM

## 2021-06-11 DIAGNOSIS — N183 Chronic kidney disease, stage 3 unspecified: Secondary | ICD-10-CM

## 2021-06-11 DIAGNOSIS — R5383 Other fatigue: Secondary | ICD-10-CM

## 2021-06-11 DIAGNOSIS — D539 Nutritional anemia, unspecified: Secondary | ICD-10-CM

## 2021-06-11 DIAGNOSIS — D631 Anemia in chronic kidney disease: Secondary | ICD-10-CM | POA: Diagnosis not present

## 2021-06-11 DIAGNOSIS — D500 Iron deficiency anemia secondary to blood loss (chronic): Secondary | ICD-10-CM

## 2021-06-11 LAB — CBC WITH DIFFERENTIAL/PLATELET
Abs Immature Granulocytes: 0.01 10*3/uL (ref 0.00–0.07)
Basophils Absolute: 0 10*3/uL (ref 0.0–0.1)
Basophils Relative: 1 %
Eosinophils Absolute: 0.1 10*3/uL (ref 0.0–0.5)
Eosinophils Relative: 3 %
HCT: 29.8 % — ABNORMAL LOW (ref 39.0–52.0)
Hemoglobin: 9.4 g/dL — ABNORMAL LOW (ref 13.0–17.0)
Immature Granulocytes: 0 %
Lymphocytes Relative: 38 %
Lymphs Abs: 1.7 10*3/uL (ref 0.7–4.0)
MCH: 30.5 pg (ref 26.0–34.0)
MCHC: 31.5 g/dL (ref 30.0–36.0)
MCV: 96.8 fL (ref 80.0–100.0)
Monocytes Absolute: 0.4 10*3/uL (ref 0.1–1.0)
Monocytes Relative: 10 %
Neutro Abs: 2.1 10*3/uL (ref 1.7–7.7)
Neutrophils Relative %: 48 %
Platelets: 286 10*3/uL (ref 150–400)
RBC: 3.08 MIL/uL — ABNORMAL LOW (ref 4.22–5.81)
RDW: 19.3 % — ABNORMAL HIGH (ref 11.5–15.5)
WBC: 4.3 10*3/uL (ref 4.0–10.5)
nRBC: 0 % (ref 0.0–0.2)

## 2021-06-11 LAB — VITAMIN B12: Vitamin B-12: 446 pg/mL (ref 180–914)

## 2021-06-11 LAB — IRON AND TIBC
Iron: 48 ug/dL (ref 45–182)
Saturation Ratios: 23 % (ref 17.9–39.5)
TIBC: 209 ug/dL — ABNORMAL LOW (ref 250–450)
UIBC: 161 ug/dL

## 2021-06-11 LAB — COMPREHENSIVE METABOLIC PANEL
ALT: 9 U/L (ref 0–44)
AST: 18 U/L (ref 15–41)
Albumin: 3.3 g/dL — ABNORMAL LOW (ref 3.5–5.0)
Alkaline Phosphatase: 50 U/L (ref 38–126)
Anion gap: 5 (ref 5–15)
BUN: 13 mg/dL (ref 8–23)
CO2: 29 mmol/L (ref 22–32)
Calcium: 7.9 mg/dL — ABNORMAL LOW (ref 8.9–10.3)
Chloride: 105 mmol/L (ref 98–111)
Creatinine, Ser: 0.95 mg/dL (ref 0.61–1.24)
GFR, Estimated: >60 mL/min (ref >60)
Glucose, Bld: 98 mg/dL (ref 70–99)
Potassium: 3.8 mmol/L (ref 3.5–5.1)
Sodium: 139 mmol/L (ref 135–145)
Total Bilirubin: 0.7 mg/dL (ref 0.3–1.2)
Total Protein: 6.2 g/dL — ABNORMAL LOW (ref 6.5–8.1)

## 2021-06-11 LAB — FERRITIN: Ferritin: 447 ng/mL — ABNORMAL HIGH (ref 24–336)

## 2021-06-11 LAB — FOLATE: Folate: 12.7 ng/mL (ref >5.9)

## 2021-06-11 MED ORDER — EPOETIN ALFA-EPBX 40000 UNIT/ML IJ SOLN
40000.0000 [IU] | Freq: Once | INTRAMUSCULAR | Status: AC
  Administered 2021-06-11: 40000 [IU] via SUBCUTANEOUS

## 2021-06-11 MED ORDER — EPOETIN ALFA-EPBX 40000 UNIT/ML IJ SOLN
INTRAMUSCULAR | Status: AC
  Filled 2021-06-11: qty 1

## 2021-06-11 NOTE — Progress Notes (Signed)
Roger Hansen presents today for Retacrit injection per the provider's orders.  Stable during administration without incident; injection site WNL; see MAR for injection details.  Patient tolerated procedure well and without incident.  No questions or complaints noted at this time. Discharge from clinic ambulatory in stable condition.  Alert and oriented X 3.  Follow up with [facility] as scheduled.

## 2021-06-11 NOTE — Patient Instructions (Signed)
[facility]  Discharge Instructions: Thank you for choosing [facility] to provide your oncology and hematology care.  If you have a lab appointment with the Cancer Center, please come in thru the Main Entrance and check in at the main information desk.  Wear comfortable clothing and clothing appropriate for easy access to any Portacath or PICC line.   We strive to give you quality time with your provider. You may need to reschedule your appointment if you arrive late (15 or more minutes).  Arriving late affects you and other patients whose appointments are after yours.  Also, if you miss three or more appointments without notifying the office, you may be dismissed from the clinic at the provider's discretion.      For prescription refill requests, have your pharmacy contact our office and allow 72 hours for refills to be completed.    Today you received the following chemotherapy and/or immunotherapy agents Retacrit      To help prevent nausea and vomiting after your treatment, we encourage you to take your nausea medication as directed.  BELOW ARE SYMPTOMS THAT SHOULD BE REPORTED IMMEDIATELY: *FEVER GREATER THAN 100.4 F (38 C) OR HIGHER *CHILLS OR SWEATING *NAUSEA AND VOMITING THAT IS NOT CONTROLLED WITH YOUR NAUSEA MEDICATION *UNUSUAL SHORTNESS OF BREATH *UNUSUAL BRUISING OR BLEEDING *URINARY PROBLEMS (pain or burning when urinating, or frequent urination) *BOWEL PROBLEMS (unusual diarrhea, constipation, pain near the anus) TENDERNESS IN MOUTH AND THROAT WITH OR WITHOUT PRESENCE OF ULCERS (sore throat, sores in mouth, or a toothache) UNUSUAL RASH, SWELLING OR PAIN  UNUSUAL VAGINAL DISCHARGE OR ITCHING   Items with * indicate a potential emergency and should be followed up as soon as possible or go to the Emergency Department if any problems should occur.  Please show the CHEMOTHERAPY ALERT CARD or IMMUNOTHERAPY ALERT CARD at check-in to the Emergency  Department and triage nurse.  Should you have questions after your visit or need to cancel or reschedule your appointment, please contact [facility] [phone]  and follow the prompts.  Office hours are 8:00 a.m. to 4:30 p.m. Monday - Friday. Please note that voicemails left after 4:00 p.m. may not be returned until the following business day.  We are closed weekends and major holidays. You have access to a nurse at all times for urgent questions. Please call the main number to the clinic [phone] and follow the prompts.  For any non-urgent questions, you may also contact your provider using MyChart. We now offer e-Visits for anyone 18 and older to request care online for non-urgent symptoms. For details visit [email or web address].   Also download the MyChart app! Go to the app store, search "MyChart", open the app, select , and log in with your MyChart username and password.  Due to Covid, a mask is required upon entering the hospital/clinic. If you do not have a mask, one will be given to you upon arrival. For doctor visits, patients may have 1 support person aged 18 or older with them. For treatment visits, patients cannot have anyone with them due to current Covid guidelines and our immunocompromised population.  

## 2021-06-13 DIAGNOSIS — E559 Vitamin D deficiency, unspecified: Secondary | ICD-10-CM | POA: Diagnosis not present

## 2021-06-13 DIAGNOSIS — E7849 Other hyperlipidemia: Secondary | ICD-10-CM | POA: Diagnosis not present

## 2021-06-13 DIAGNOSIS — I10 Essential (primary) hypertension: Secondary | ICD-10-CM | POA: Diagnosis not present

## 2021-06-14 DIAGNOSIS — R946 Abnormal results of thyroid function studies: Secondary | ICD-10-CM | POA: Diagnosis not present

## 2021-06-14 DIAGNOSIS — K219 Gastro-esophageal reflux disease without esophagitis: Secondary | ICD-10-CM | POA: Diagnosis not present

## 2021-06-14 DIAGNOSIS — E7849 Other hyperlipidemia: Secondary | ICD-10-CM | POA: Diagnosis not present

## 2021-06-14 DIAGNOSIS — N183 Chronic kidney disease, stage 3 unspecified: Secondary | ICD-10-CM | POA: Diagnosis not present

## 2021-06-14 DIAGNOSIS — E782 Mixed hyperlipidemia: Secondary | ICD-10-CM | POA: Diagnosis not present

## 2021-06-14 DIAGNOSIS — Z1159 Encounter for screening for other viral diseases: Secondary | ICD-10-CM | POA: Diagnosis not present

## 2021-06-18 DIAGNOSIS — I5032 Chronic diastolic (congestive) heart failure: Secondary | ICD-10-CM | POA: Diagnosis not present

## 2021-06-18 DIAGNOSIS — E7849 Other hyperlipidemia: Secondary | ICD-10-CM | POA: Diagnosis not present

## 2021-06-18 DIAGNOSIS — I426 Alcoholic cardiomyopathy: Secondary | ICD-10-CM | POA: Diagnosis not present

## 2021-06-18 DIAGNOSIS — Z6832 Body mass index (BMI) 32.0-32.9, adult: Secondary | ICD-10-CM | POA: Diagnosis not present

## 2021-06-18 DIAGNOSIS — I700 Atherosclerosis of aorta: Secondary | ICD-10-CM | POA: Diagnosis not present

## 2021-06-18 DIAGNOSIS — E871 Hypo-osmolality and hyponatremia: Secondary | ICD-10-CM | POA: Diagnosis not present

## 2021-06-18 DIAGNOSIS — E876 Hypokalemia: Secondary | ICD-10-CM | POA: Diagnosis not present

## 2021-06-18 DIAGNOSIS — R768 Other specified abnormal immunological findings in serum: Secondary | ICD-10-CM | POA: Diagnosis not present

## 2021-06-18 DIAGNOSIS — I10 Essential (primary) hypertension: Secondary | ICD-10-CM | POA: Diagnosis not present

## 2021-06-18 DIAGNOSIS — E782 Mixed hyperlipidemia: Secondary | ICD-10-CM | POA: Diagnosis not present

## 2021-06-18 DIAGNOSIS — D6489 Other specified anemias: Secondary | ICD-10-CM | POA: Diagnosis not present

## 2021-06-24 ENCOUNTER — Other Ambulatory Visit (HOSPITAL_COMMUNITY): Payer: Self-pay | Admitting: *Deleted

## 2021-06-24 ENCOUNTER — Other Ambulatory Visit (HOSPITAL_COMMUNITY): Payer: Self-pay | Admitting: Physician Assistant

## 2021-06-24 DIAGNOSIS — D696 Thrombocytopenia, unspecified: Secondary | ICD-10-CM

## 2021-06-24 DIAGNOSIS — N183 Chronic kidney disease, stage 3 unspecified: Secondary | ICD-10-CM

## 2021-06-24 DIAGNOSIS — D500 Iron deficiency anemia secondary to blood loss (chronic): Secondary | ICD-10-CM

## 2021-06-24 DIAGNOSIS — D649 Anemia, unspecified: Secondary | ICD-10-CM

## 2021-06-24 NOTE — Progress Notes (Signed)
[place], [place] [address]   CLINIC:  Medical Oncology/Hematology  PCP:  Curlene Labrum, MD [place] [address] [phone]   REASON FOR VISIT:  Follow-up for anemia of CKD and iron deficiency  PRIOR THERAPY: None  CURRENT THERAPY: Retacrit injections every 2 weeks; intermittent parenteral iron therapy  INTERVAL HISTORY:  [name] 75 y.o. male returns for routine follow-up of anemia secondary to CKD and iron deficiency.  He was last seen by Dr. Delton Coombes on 04/30/2021.  At today's visit, he reports feeling fair.  No recent hospitalizations, surgeries, or changes in baseline health status.  He continues to take oral iron supplement twice daily.  He reports that he has intermittent dark stools, occasionally black, but is uncertain if this is due to his oral iron.  He denies any gross rectal hemorrhage or overt melena.  No epistaxis, hemoptysis, hematemesis, or hematuria.  He does continue to have some mild fatigue, but denies exertional chest pain, dyspnea on exertion, syncope, lightheadedness, or dizziness.  He does note that he continues to have chronic intermittent chest pain that is associated with panic attacks that he relates to his PTSD.  He reports that his cardiologist is aware, and has prescribed him nitroglycerin tablets to be used for severe and unrelenting pain.  Patient reports that he has not yet had to use nitroglycerin tablets.  No changes in his baseline chest pain pattern.  He denies any chest pain at today's visit.  He has 75% energy and 100% appetite. He endorses that he is maintaining a stable weight.   REVIEW OF SYSTEMS:  Review of Systems  Constitutional:  Positive for fatigue. Negative for appetite change, chills, diaphoresis, fever and unexpected weight change.  HENT:   Negative for lump/mass and nosebleeds.   Eyes:  Negative for eye problems.  Respiratory:  Negative for cough, hemoptysis and  shortness of breath.   Cardiovascular:  Positive for chest pain (Associated with anxiety). Negative for leg swelling and palpitations.  Gastrointestinal:  Positive for constipation and diarrhea. Negative for abdominal pain, blood in stool, nausea and vomiting.  Genitourinary:  Negative for hematuria.   Skin: Negative.   Neurological:  Positive for headaches and numbness. Negative for dizziness and light-headedness.  Hematological:  Does not bruise/bleed easily.  Psychiatric/Behavioral:  Positive for sleep disturbance. The patient is nervous/anxious.      PAST MEDICAL/SURGICAL HISTORY:  Past Medical History:  Diagnosis Date   Alcohol abuse 11/24/2016   Anemia 10/11/2013   Cardiomyopathy ([place])    a. EF 50% by echo in 2019 with NST showing no reversible ischemia and thought to be alcohol-induced   Chronic renal disease, stage 3, moderately decreased glomerular filtration rate (GFR) between 30-59 mL/min/1.73 square meter (HCC) 12/02/2016   Colon polyps    GERD (gastroesophageal reflux disease)    High cholesterol    Hypertension    Sinus infection    Past Surgical History:  Procedure Laterality Date   BIOPSY  06/03/2019   Procedure: BIOPSY;  Surgeon: Rogene Houston, MD;  [facility];  Service: Endoscopy;;  esophagus   COLONOSCOPY N/A 10/26/2013   Procedure: COLONOSCOPY;  Surgeon: Rogene Houston, MD;  [facility];  Service: Endoscopy;  Laterality: N/A;  325-moved to 1230 Ann to notify pt   Colonoscopy with polypectomy     COLONOSCOPY WITH PROPOFOL N/A 06/03/2019   Procedure: COLONOSCOPY WITH PROPOFOL;  Surgeon: Rogene Houston, MD;  [facility];  Service: Endoscopy;  Laterality: N/A;   ESOPHAGOGASTRODUODENOSCOPY (EGD) WITH PROPOFOL N/A 06/03/2019   Procedure: ESOPHAGOGASTRODUODENOSCOPY (EGD) WITH PROPOFOL;  Surgeon: Rogene Houston, MD;  [facility];  Service: Endoscopy;  Laterality: N/A;   POLYPECTOMY  06/03/2019   Procedure:  POLYPECTOMY;  Surgeon: Rogene Houston, MD;  [facility];  Service: Endoscopy;;  colon     SOCIAL HISTORY:  Social History   Socioeconomic History   Marital status: Married    Spouse name: Not on file   Number of children: Not on file   Years of education: Not on file   Highest education level: Not on file  Occupational History   Not on file  Tobacco Use   Smoking status: Former    Packs/day: 0.50    Years: 20.00    Pack years: 10.00    Types: Cigarettes    Quit date: 12/27/1971    Years since quitting: 49.5   Smokeless tobacco: Never  Substance and Sexual Activity   Alcohol use: Yes    Comment: 1 pint of etoh a day ( not recently)   Drug use: No   Sexual activity: Not on file  Other Topics Concern   Not on file  Social History Narrative   Not on file   Social Determinants of Health   Financial Resource Strain: Not on file  Food Insecurity: No Food Insecurity   Worried About Running Out of Food in the Last Year: Never true   [place] in the Last Year: Never true  Transportation Needs: No Transportation Needs   Lack of Transportation (Medical): No   Lack of Transportation (Non-Medical): No  Physical Activity: Inactive   Days of Exercise per Week: 0 days   Minutes of Exercise per Session: 0 min  Stress: No Stress Concern Present   Feeling of Stress : Not at all  Social Connections: Not on file  Intimate Partner Violence: Not At Risk   Fear of Current or Ex-Partner: No   Emotionally Abused: No   Physically Abused: No   Sexually Abused: No    FAMILY HISTORY:  Family History  Problem Relation Age of Onset   Hypertension Mother    Colon cancer Neg Hx     CURRENT MEDICATIONS:  Outpatient Encounter Medications as of 06/25/2021  Medication Sig Note   ALPRAZolam (XANAX) 1 MG tablet     aspirin 81 MG tablet Take 1 tablet (81 mg total) by mouth daily.    atorvastatin (LIPITOR) 10 MG tablet Take 10 mg by mouth daily.    buPROPion (WELLBUTRIN  SR) 150 MG 12 hr tablet Take 150 mg by mouth daily.     chlorhexidine (PERIDEX) 0.12 % solution     cholecalciferol (VITAMIN D) 1000 UNITS tablet Take 1,000 Units by mouth 2 (two) times daily.    donepezil (ARICEPT) 10 MG tablet     Eszopiclone 3 MG TABS Take 3 mg by mouth at bedtime.     Ferrous Sulfate (IRON) 325 (65 Fe) MG TABS TAKE ONE TABLET BY MOUTH DAILY WITH BREAKFAST. (Patient taking differently: 1 tablet 2 (two) times a day.)    folic acid (FOLVITE) 1 MG tablet Take 1 mg by mouth daily.    furosemide (LASIX) 20 MG tablet Take 20 mg by mouth daily.     gabapentin (NEURONTIN) 300 MG capsule Take 300 mg by mouth 3 (three) times daily as needed. 10/21/2016: Received from: External Pharmacy  GNP VITAMIN B-1 100 MG tablet Take 100 mg by mouth daily.     losartan (COZAAR) 50 MG tablet Take 50 mg by mouth daily.    nitroGLYCERIN (NITROSTAT) 0.4 MG SL tablet Place 1 tablet (0.4 mg total) under the tongue every 5 (five) minutes as needed for chest pain.    omeprazole (PRILOSEC) 20 MG capsule Take 20 mg by mouth daily as needed (Acid Reflux).    potassium chloride SA (K-DUR) 20 MEQ tablet Take 20 mEq by mouth daily.     QUEtiapine (SEROQUEL) 100 MG tablet     tamsulosin (FLOMAX) 0.4 MG CAPS capsule     traZODone (DESYREL) 100 MG tablet Take 100 mg by mouth at bedtime.    vitamin B-12 (CYANOCOBALAMIN) 1000 MCG tablet Take 1,000 mcg by mouth daily.    ziprasidone (GEODON) 40 MG capsule Take 40 mg by mouth 2 (two) times daily with a meal.     No facility-administered encounter medications on file as of 06/25/2021.    ALLERGIES:  Allergies  Allergen Reactions   Horseradish [Armoracia Rusticana Ext (Horseradish)] Anaphylaxis     PHYSICAL EXAM:  ECOG PERFORMANCE STATUS: 1 - Symptomatic but completely ambulatory  There were no vitals filed for this visit. There were no vitals filed for this visit. Physical Exam Constitutional:      Appearance: Normal appearance. He is obese.  HENT:      Head: Normocephalic and atraumatic.     Mouth/Throat:     Mouth: Mucous membranes are moist.  Eyes:     Extraocular Movements: Extraocular movements intact.     Pupils: Pupils are equal, round, and reactive to light.  Cardiovascular:     Rate and Rhythm: Normal rate and regular rhythm.     Pulses: Normal pulses.     Heart sounds: Normal heart sounds.  Pulmonary:     Effort: Pulmonary effort is normal.     Breath sounds: Normal breath sounds.  Abdominal:     General: Bowel sounds are normal.     Palpations: Abdomen is soft.     Tenderness: There is no abdominal tenderness.  Musculoskeletal:        General: No swelling.     Right lower leg: No edema.     Left lower leg: No edema.  Lymphadenopathy:     Cervical: No cervical adenopathy.  Skin:    General: Skin is warm and dry.  Neurological:     General: No focal deficit present.     Mental Status: He is alert and oriented to person, place, and time.  Psychiatric:        Mood and Affect: Mood normal.        Behavior: Behavior normal.     LABORATORY DATA:  I have reviewed the labs as listed.  CBC    Component Value Date/Time   WBC 4.3 06/11/2021 0835   RBC 3.08 (L) 06/11/2021 0835   HGB 9.4 (L) 06/11/2021 0835   HCT 29.8 (L) 06/11/2021 0835   HCT 27.9 (L) 08/13/2016 0944   PLT 286 06/11/2021 0835   MCV 96.8 06/11/2021 0835   MCH 30.5 06/11/2021 0835   MCHC 31.5 06/11/2021 0835   RDW 19.3 (H) 06/11/2021 0835   LYMPHSABS 1.7 06/11/2021 0835   MONOABS 0.4 06/11/2021 0835   EOSABS 0.1 06/11/2021 0835   BASOSABS 0.0 06/11/2021 0835   CMP Latest Ref Rng & Units 06/11/2021 05/28/2021 12/25/2020  Glucose 70 - 99 mg/dL 98 106(H) 100(H)  BUN 8 -  23 mg/dL '13 12 16  ' Creatinine 0.61 - 1.24 mg/dL 0.95 1.21 1.01  Sodium 135 - 145 mmol/L 139 139 138  Potassium 3.5 - 5.1 mmol/L 3.8 3.7 4.6  Chloride 98 - 111 mmol/L 105 96(L) 104  CO2 22 - 32 mmol/L 29 34(H) 28  Calcium 8.9 - 10.3 mg/dL 7.9(L) 8.4(L) 9.2  Total Protein 6.5 - 8.1  g/dL 6.2(L) 6.8 7.0  Total Bilirubin 0.3 - 1.2 mg/dL 0.7 1.9(H) 1.1  Alkaline Phos 38 - 126 U/L 50 72 67  AST 15 - 41 U/L 18 25 13(L)  ALT 0 - 44 U/L '9 18 11    ' DIAGNOSTIC IMAGING:  I have independently reviewed the relevant imaging and discussed with the patient.  ASSESSMENT & PLAN: 1. Anemia secondary to CKD and iron deficiency: - His anemia dates back to at least 2014 with a negative peripheral work-up, negative bone marrow aspiration and biopsy and cytogenetics on 10/16/2016 in the setting of alcohol abuse.  The bone marrow showed slightly hypercellular marrow with trilineage hematopoiesis.  Chromosome analysis was normal. - He has been receiving intermittent Epogen and parenteral iron therapy since June 2020. - He is a Jehovah witness and does not receive blood products. - Last colonoscopy and EGD on 06/03/2019: Mild esophagitis and duodenitis on EGD, polyps and external hemorrhoids on colonoscopy - He is tolerating Epogen 40,000 units every 2 weeks very well. - He will continue iron tablets 325 mg twice daily.  He reported occasional dark stools but denied any bleeding per rectum. - Most recent labs (06/11/2021): Hgb 9.4/MCV 96.8, creatinine 0.95 (GFR > 60), ferritin 447, iron saturation 23%; folate and B12 normal - MGUS/myeloma panel is pending - Differential diagnosis includes anemia related to mild CKD, iron deficiency, chronic disease - Unable to exclude early MDS, but will consider bone marrow biopsy in the future if there are any significant deviations from baseline - History of previous heavy alcohol abuse may be a contributing factor to anemia given its bone marrow toxicity - PLAN: Continue Epogen 40,000 units every 2 weeks with biweekly CBC.  Repeat full lab panel before follow-up visit in 3 months.     PLAN SUMMARY & DISPOSITION: - Epogen 40,000 units every 2 weeks - CBC every 2 weeks - Follow-up visit in 3 months with full lab panel week before  All questions were  answered. The patient knows to call the clinic with any problems, questions or concerns.  Medical decision making: Moderate  Time spent on visit: I spent 20 minutes counseling the patient face to face. The total time spent in the appointment was 30 minutes and more than 50% was on counseling.   Harriett Rush, PA-C  06/25/2021 2:53 PM

## 2021-06-25 ENCOUNTER — Inpatient Hospital Stay (HOSPITAL_COMMUNITY): Payer: Medicare Other | Attending: Hematology

## 2021-06-25 ENCOUNTER — Other Ambulatory Visit: Payer: Self-pay

## 2021-06-25 ENCOUNTER — Inpatient Hospital Stay (HOSPITAL_BASED_OUTPATIENT_CLINIC_OR_DEPARTMENT_OTHER): Payer: Medicare Other | Admitting: Physician Assistant

## 2021-06-25 ENCOUNTER — Inpatient Hospital Stay (HOSPITAL_COMMUNITY): Payer: Medicare Other

## 2021-06-25 VITALS — BP 138/79 | HR 81 | Temp 98.7°F | Resp 16 | Wt 190.8 lb

## 2021-06-25 DIAGNOSIS — N183 Chronic kidney disease, stage 3 unspecified: Secondary | ICD-10-CM | POA: Diagnosis not present

## 2021-06-25 DIAGNOSIS — D631 Anemia in chronic kidney disease: Secondary | ICD-10-CM | POA: Insufficient documentation

## 2021-06-25 DIAGNOSIS — D649 Anemia, unspecified: Secondary | ICD-10-CM

## 2021-06-25 DIAGNOSIS — D500 Iron deficiency anemia secondary to blood loss (chronic): Secondary | ICD-10-CM

## 2021-06-25 DIAGNOSIS — D696 Thrombocytopenia, unspecified: Secondary | ICD-10-CM

## 2021-06-25 LAB — CBC WITH DIFFERENTIAL/PLATELET
Abs Immature Granulocytes: 0 10*3/uL (ref 0.00–0.07)
Basophils Absolute: 0.1 10*3/uL (ref 0.0–0.1)
Basophils Relative: 1 %
Eosinophils Absolute: 0.3 10*3/uL (ref 0.0–0.5)
Eosinophils Relative: 5 %
HCT: 31.5 % — ABNORMAL LOW (ref 39.0–52.0)
Hemoglobin: 10 g/dL — ABNORMAL LOW (ref 13.0–17.0)
Immature Granulocytes: 0 %
Lymphocytes Relative: 35 %
Lymphs Abs: 2 10*3/uL (ref 0.7–4.0)
MCH: 29.8 pg (ref 26.0–34.0)
MCHC: 31.7 g/dL (ref 30.0–36.0)
MCV: 93.8 fL (ref 80.0–100.0)
Monocytes Absolute: 0.4 10*3/uL (ref 0.1–1.0)
Monocytes Relative: 8 %
Neutro Abs: 2.8 10*3/uL (ref 1.7–7.7)
Neutrophils Relative %: 51 %
Platelets: 270 10*3/uL (ref 150–400)
RBC: 3.36 MIL/uL — ABNORMAL LOW (ref 4.22–5.81)
RDW: 18.9 % — ABNORMAL HIGH (ref 11.5–15.5)
WBC: 5.5 10*3/uL (ref 4.0–10.5)
nRBC: 0 % (ref 0.0–0.2)

## 2021-06-25 LAB — LACTATE DEHYDROGENASE: LDH: 136 U/L (ref 98–192)

## 2021-06-25 MED ORDER — EPOETIN ALFA-EPBX 40000 UNIT/ML IJ SOLN
INTRAMUSCULAR | Status: AC
  Filled 2021-06-25: qty 1

## 2021-06-25 MED ORDER — EPOETIN ALFA-EPBX 40000 UNIT/ML IJ SOLN
40000.0000 [IU] | Freq: Once | INTRAMUSCULAR | Status: AC
  Administered 2021-06-25: 40000 [IU] via SUBCUTANEOUS

## 2021-06-25 NOTE — Patient Instructions (Signed)
[facility]  Discharge Instructions: Thank you for choosing [facility] to provide your oncology and hematology care.  If you have a lab appointment with the Cancer Center, please come in thru the Main Entrance and check in at the main information desk.  Wear comfortable clothing and clothing appropriate for easy access to any Portacath or PICC line.   We strive to give you quality time with your provider. You may need to reschedule your appointment if you arrive late (15 or more minutes).  Arriving late affects you and other patients whose appointments are after yours.  Also, if you miss three or more appointments without notifying the office, you may be dismissed from the clinic at the provider's discretion.      For prescription refill requests, have your pharmacy contact our office and allow 72 hours for refills to be completed.    Today you received the following chemotherapy and/or immunotherapy agents Retacrit injection      To help prevent nausea and vomiting after your treatment, we encourage you to take your nausea medication as directed.  BELOW ARE SYMPTOMS THAT SHOULD BE REPORTED IMMEDIATELY: *FEVER GREATER THAN 100.4 F (38 C) OR HIGHER *CHILLS OR SWEATING *NAUSEA AND VOMITING THAT IS NOT CONTROLLED WITH YOUR NAUSEA MEDICATION *UNUSUAL SHORTNESS OF BREATH *UNUSUAL BRUISING OR BLEEDING *URINARY PROBLEMS (pain or burning when urinating, or frequent urination) *BOWEL PROBLEMS (unusual diarrhea, constipation, pain near the anus) TENDERNESS IN MOUTH AND THROAT WITH OR WITHOUT PRESENCE OF ULCERS (sore throat, sores in mouth, or a toothache) UNUSUAL RASH, SWELLING OR PAIN  UNUSUAL VAGINAL DISCHARGE OR ITCHING   Items with * indicate a potential emergency and should be followed up as soon as possible or go to the Emergency Department if any problems should occur.  Please show the CHEMOTHERAPY ALERT CARD or IMMUNOTHERAPY ALERT CARD at check-in to the  Emergency Department and triage nurse.  Should you have questions after your visit or need to cancel or reschedule your appointment, please contact [facility] [phone]  and follow the prompts.  Office hours are 8:00 a.m. to 4:30 p.m. Monday - Friday. Please note that voicemails left after 4:00 p.m. may not be returned until the following business day.  We are closed weekends and major holidays. You have access to a nurse at all times for urgent questions. Please call the main number to the clinic [phone] and follow the prompts.  For any non-urgent questions, you may also contact your provider using MyChart. We now offer e-Visits for anyone 18 and older to request care online for non-urgent symptoms. For details visit [email or web address].   Also download the MyChart app! Go to the app store, search "MyChart", open the app, select [facility], and log in with your MyChart username and password.  Due to Covid, a mask is required upon entering the hospital/clinic. If you do not have a mask, one will be given to you upon arrival. For doctor visits, patients may have 1 support person aged 18 or older with them. For treatment visits, patients cannot have anyone with them due to current Covid guidelines and our immunocompromised population.  

## 2021-06-25 NOTE — Patient Instructions (Signed)
[place] [facility] Discharge Instructions  You were seen today by Tarri Abernethy PA-C for your anemia.  Your blood levels are stable at what is your baseline range.  Your iron counts are normal.  Continue to take iron supplement twice a day.  If this causes constipation, you can take over-the-counter stool softener such as Colace.  If you notice any blood in your bowel movements, please alert our clinic.  If bleeding is severe seek immediate medical attention at the emergency department.  Blood in your bowel movements can either be bright red blood in the toilet or black-tarry bowel movements.  Continue to check your CBC every 2 weeks.  Continue Retacrit injection every 2 weeks.  We will check a full lab panel and have you follow-up for an office visit in 3 months.    Thank you for choosing [place] [facility] to provide your oncology and hematology care.  To afford each patient quality time with our provider, please arrive at least 15 minutes before your scheduled appointment time.   If you have a lab appointment with the [facility] please come in thru the Main Entrance and check in at the main information desk.  You need to re-schedule your appointment should you arrive 10 or more minutes late.  We strive to give you quality time with our providers, and arriving late affects you and other patients whose appointments are after yours.  Also, if you no show three or more times for appointments you may be dismissed from the clinic at the providers discretion.     Again, thank you for choosing [facility].  Our hope is that these requests will decrease the amount of time that you wait before being seen by our physicians.       _____________________________________________________________  Should you have questions after your visit to [facility], please contact our office at [phone] and follow the  prompts.  Our office hours are 8:00 a.m. and 4:30 p.m. Monday - Friday.  Please note that voicemails left after 4:00 p.m. may not be returned until the following business day.  We are closed weekends and major holidays.  You do have access to a nurse 24-7, just call the main number to the clinic [phone] and do not press any options, hold on the line and a nurse will answer the phone.    For prescription refill requests, have your pharmacy contact our office and allow 72 hours.    Due to Covid, you will need to wear a mask upon entering the hospital. If you do not have a mask, a mask will be given to you at the Main Entrance upon arrival. For doctor visits, patients may have 1 support person age 99 or older with them. For treatment visits, patients can not have anyone with them due to social distancing guidelines and our immunocompromised population.

## 2021-06-25 NOTE — Progress Notes (Signed)
POPE BRUNTY presents today for Retacrit injection per the provider's orders.  Hgb 10.0.  Stable during administration without incident; injection site WNL; see MAR for injection details.  Patient tolerated procedure well and without incident.  No questions or complaints noted at this time. Discharge from clinic ambulatory in stable condition.  Alert and oriented X 3.  Follow up with [facility] as scheduled.

## 2021-06-26 LAB — KAPPA/LAMBDA LIGHT CHAINS
Kappa free light chain: 55.4 mg/L — ABNORMAL HIGH (ref 3.3–19.4)
Kappa, lambda light chain ratio: 1.49 (ref 0.26–1.65)
Lambda free light chains: 37.3 mg/L — ABNORMAL HIGH (ref 5.7–26.3)

## 2021-06-27 LAB — IMMUNOFIXATION ELECTROPHORESIS
IgA: 357 mg/dL (ref 61–437)
IgG (Immunoglobin G), Serum: 1130 mg/dL (ref 603–1613)
IgM (Immunoglobulin M), Srm: 73 mg/dL (ref 15–143)
Total Protein ELP: 6.9 g/dL (ref 6.0–8.5)

## 2021-06-28 LAB — PROTEIN ELECTROPHORESIS, SERUM
A/G Ratio: 1.4 (ref 0.7–1.7)
Albumin ELP: 4 g/dL (ref 2.9–4.4)
Alpha-1-Globulin: 0.2 g/dL (ref 0.0–0.4)
Alpha-2-Globulin: 0.6 g/dL (ref 0.4–1.0)
Beta Globulin: 1 g/dL (ref 0.7–1.3)
Gamma Globulin: 1.2 g/dL (ref 0.4–1.8)
Globulin, Total: 2.9 g/dL (ref 2.2–3.9)
Total Protein ELP: 6.9 g/dL (ref 6.0–8.5)

## 2021-07-01 DIAGNOSIS — R768 Other specified abnormal immunological findings in serum: Secondary | ICD-10-CM | POA: Diagnosis not present

## 2021-07-09 ENCOUNTER — Inpatient Hospital Stay (HOSPITAL_COMMUNITY): Payer: Medicare Other

## 2021-07-09 ENCOUNTER — Other Ambulatory Visit: Payer: Self-pay

## 2021-07-09 VITALS — BP 115/71 | HR 83 | Temp 97.0°F | Resp 17

## 2021-07-09 DIAGNOSIS — D649 Anemia, unspecified: Secondary | ICD-10-CM

## 2021-07-09 DIAGNOSIS — N183 Chronic kidney disease, stage 3 unspecified: Secondary | ICD-10-CM | POA: Diagnosis not present

## 2021-07-09 DIAGNOSIS — D500 Iron deficiency anemia secondary to blood loss (chronic): Secondary | ICD-10-CM

## 2021-07-09 DIAGNOSIS — D631 Anemia in chronic kidney disease: Secondary | ICD-10-CM | POA: Diagnosis not present

## 2021-07-09 DIAGNOSIS — N2889 Other specified disorders of kidney and ureter: Secondary | ICD-10-CM

## 2021-07-09 LAB — CBC
HCT: 30.8 % — ABNORMAL LOW (ref 39.0–52.0)
Hemoglobin: 10 g/dL — ABNORMAL LOW (ref 13.0–17.0)
MCH: 29.7 pg (ref 26.0–34.0)
MCHC: 32.5 g/dL (ref 30.0–36.0)
MCV: 91.4 fL (ref 80.0–100.0)
Platelets: 158 10*3/uL (ref 150–400)
RBC: 3.37 MIL/uL — ABNORMAL LOW (ref 4.22–5.81)
RDW: 19.2 % — ABNORMAL HIGH (ref 11.5–15.5)
WBC: 5.4 10*3/uL (ref 4.0–10.5)
nRBC: 0 % (ref 0.0–0.2)

## 2021-07-09 MED ORDER — EPOETIN ALFA-EPBX 40000 UNIT/ML IJ SOLN
40000.0000 [IU] | Freq: Once | INTRAMUSCULAR | Status: AC
  Administered 2021-07-09: 40000 [IU] via SUBCUTANEOUS
  Filled 2021-07-09: qty 1

## 2021-07-09 NOTE — Patient Instructions (Signed)
[facility]  Discharge Instructions: Thank you for choosing [facility] to provide your oncology and hematology care.  If you have a lab appointment with the Cancer Center, please come in thru the Main Entrance and check in at the main information desk.  Wear comfortable clothing and clothing appropriate for easy access to any Portacath or PICC line.   We strive to give you quality time with your provider. You may need to reschedule your appointment if you arrive late (15 or more minutes).  Arriving late affects you and other patients whose appointments are after yours.  Also, if you miss three or more appointments without notifying the office, you may be dismissed from the clinic at the provider's discretion.      For prescription refill requests, have your pharmacy contact our office and allow 72 hours for refills to be completed.    Today you received the following chemotherapy and/or immunotherapy agents Retacrit      To help prevent nausea and vomiting after your treatment, we encourage you to take your nausea medication as directed.  BELOW ARE SYMPTOMS THAT SHOULD BE REPORTED IMMEDIATELY: *FEVER GREATER THAN 100.4 F (38 C) OR HIGHER *CHILLS OR SWEATING *NAUSEA AND VOMITING THAT IS NOT CONTROLLED WITH YOUR NAUSEA MEDICATION *UNUSUAL SHORTNESS OF BREATH *UNUSUAL BRUISING OR BLEEDING *URINARY PROBLEMS (pain or burning when urinating, or frequent urination) *BOWEL PROBLEMS (unusual diarrhea, constipation, pain near the anus) TENDERNESS IN MOUTH AND THROAT WITH OR WITHOUT PRESENCE OF ULCERS (sore throat, sores in mouth, or a toothache) UNUSUAL RASH, SWELLING OR PAIN  UNUSUAL VAGINAL DISCHARGE OR ITCHING   Items with * indicate a potential emergency and should be followed up as soon as possible or go to the Emergency Department if any problems should occur.  Please show the CHEMOTHERAPY ALERT CARD or IMMUNOTHERAPY ALERT CARD at check-in to the Emergency  Department and triage nurse.  Should you have questions after your visit or need to cancel or reschedule your appointment, please contact  CANCER CENTER [phone]  and follow the prompts.  Office hours are 8:00 a.m. to 4:30 p.m. Monday - Friday. Please note that voicemails left after 4:00 p.m. may not be returned until the following business day.  We are closed weekends and major holidays. You have access to a nurse at all times for urgent questions. Please call the main number to the clinic [phone] and follow the prompts.  For any non-urgent questions, you may also contact your provider using MyChart. We now offer e-Visits for anyone 18 and older to request care online for non-urgent symptoms. For details visit [email or web address].   Also download the MyChart app! Go to the app store, search "MyChart", open the app, select [facility], and log in with your MyChart username and password.  Due to Covid, a mask is required upon entering the hospital/clinic. If you do not have a mask, one will be given to you upon arrival. For doctor visits, patients may have 1 support person aged 18 or older with them. For treatment visits, patients cannot have anyone with them due to current Covid guidelines and our immunocompromised population.  

## 2021-07-09 NOTE — Progress Notes (Signed)
Roger Hansen presents today for Retacrit injection per the provider's orders.  Hgb noted to be 10.0.  Stable during administration without incident; injection site WNL; see MAR for injection details.  Patient tolerated procedure well and without incident.  No questions or complaints noted at this time.  Discharge from clinic ambulatory in stable condition.  Alert and oriented X 3.  Follow up with [facility] as scheduled.

## 2021-07-23 ENCOUNTER — Other Ambulatory Visit: Payer: Self-pay

## 2021-07-23 ENCOUNTER — Inpatient Hospital Stay (HOSPITAL_COMMUNITY): Payer: Medicare Other | Attending: Hematology

## 2021-07-23 ENCOUNTER — Inpatient Hospital Stay (HOSPITAL_COMMUNITY): Payer: Medicare Other

## 2021-07-23 VITALS — BP 113/51 | HR 71 | Temp 98.5°F | Resp 18

## 2021-07-23 DIAGNOSIS — Z87891 Personal history of nicotine dependence: Secondary | ICD-10-CM | POA: Insufficient documentation

## 2021-07-23 DIAGNOSIS — D631 Anemia in chronic kidney disease: Secondary | ICD-10-CM | POA: Diagnosis not present

## 2021-07-23 DIAGNOSIS — D649 Anemia, unspecified: Secondary | ICD-10-CM

## 2021-07-23 DIAGNOSIS — N183 Chronic kidney disease, stage 3 unspecified: Secondary | ICD-10-CM

## 2021-07-23 DIAGNOSIS — D500 Iron deficiency anemia secondary to blood loss (chronic): Secondary | ICD-10-CM

## 2021-07-23 LAB — CBC
HCT: 27.8 % — ABNORMAL LOW (ref 39.0–52.0)
Hemoglobin: 8.9 g/dL — ABNORMAL LOW (ref 13.0–17.0)
MCH: 29.1 pg (ref 26.0–34.0)
MCHC: 32 g/dL (ref 30.0–36.0)
MCV: 90.8 fL (ref 80.0–100.0)
Platelets: 195 10*3/uL (ref 150–400)
RBC: 3.06 MIL/uL — ABNORMAL LOW (ref 4.22–5.81)
RDW: 19.6 % — ABNORMAL HIGH (ref 11.5–15.5)
WBC: 3.7 10*3/uL — ABNORMAL LOW (ref 4.0–10.5)
nRBC: 0 % (ref 0.0–0.2)

## 2021-07-23 MED ORDER — EPOETIN ALFA-EPBX 40000 UNIT/ML IJ SOLN
40000.0000 [IU] | Freq: Once | INTRAMUSCULAR | Status: AC
  Administered 2021-07-23: 40000 [IU] via SUBCUTANEOUS

## 2021-07-23 MED ORDER — EPOETIN ALFA-EPBX 40000 UNIT/ML IJ SOLN
INTRAMUSCULAR | Status: AC
  Filled 2021-07-23: qty 1

## 2021-07-23 NOTE — Patient Instructions (Signed)
[facility]  Discharge Instructions: Thank you for choosing [facility] to provide your oncology and hematology care.  If you have a lab appointment with the Cancer Center, please come in thru the Main Entrance and check in at the main information desk.  Wear comfortable clothing and clothing appropriate for easy access to any Portacath or PICC line.   We strive to give you quality time with your provider. You may need to reschedule your appointment if you arrive late (15 or more minutes).  Arriving late affects you and other patients whose appointments are after yours.  Also, if you miss three or more appointments without notifying the office, you may be dismissed from the clinic at the provider's discretion.      For prescription refill requests, have your pharmacy contact our office and allow 72 hours for refills to be completed.    Today you received the following chemotherapy and/or immunotherapy agents Retacrit      To help prevent nausea and vomiting after your treatment, we encourage you to take your nausea medication as directed.  BELOW ARE SYMPTOMS THAT SHOULD BE REPORTED IMMEDIATELY: *FEVER GREATER THAN 100.4 F (38 C) OR HIGHER *CHILLS OR SWEATING *NAUSEA AND VOMITING THAT IS NOT CONTROLLED WITH YOUR NAUSEA MEDICATION *UNUSUAL SHORTNESS OF BREATH *UNUSUAL BRUISING OR BLEEDING *URINARY PROBLEMS (pain or burning when urinating, or frequent urination) *BOWEL PROBLEMS (unusual diarrhea, constipation, pain near the anus) TENDERNESS IN MOUTH AND THROAT WITH OR WITHOUT PRESENCE OF ULCERS (sore throat, sores in mouth, or a toothache) UNUSUAL RASH, SWELLING OR PAIN  UNUSUAL VAGINAL DISCHARGE OR ITCHING   Items with * indicate a potential emergency and should be followed up as soon as possible or go to the Emergency Department if any problems should occur.  Please show the CHEMOTHERAPY ALERT CARD or IMMUNOTHERAPY ALERT CARD at check-in to the Emergency  Department and triage nurse.  Should you have questions after your visit or need to cancel or reschedule your appointment, please contact [facility] [phone]  and follow the prompts.  Office hours are 8:00 a.m. to 4:30 p.m. Monday - Friday. Please note that voicemails left after 4:00 p.m. may not be returned until the following business day.  We are closed weekends and major holidays. You have access to a nurse at all times for urgent questions. Please call the main number to the clinic [phone] and follow the prompts.  For any non-urgent questions, you may also contact your provider using MyChart. We now offer e-Visits for anyone 18 and older to request care online for non-urgent symptoms. For details visit [email or web address].   Also download the MyChart app! Go to the app store, search "MyChart", open the app, select [facility], and log in with your MyChart username and password.  Due to Covid, a mask is required upon entering the hospital/clinic. If you do not have a mask, one will be given to you upon arrival. For doctor visits, patients may have 1 support person aged 18 or older with them. For treatment visits, patients cannot have anyone with them due to current Covid guidelines and our immunocompromised population.  

## 2021-07-23 NOTE — Progress Notes (Signed)
Roger Hansen presents today for Retacrit injection per the provider's orders.  Stable during administration without incident; injection site WNL; see MAR for injection details.  Patient tolerated procedure well and without incident.  No questions or complaints noted at this time. Discharge from clinic ambulatory in stable condition.  Alert and oriented X 3.  Follow up with [facility] as scheduled.

## 2021-08-06 ENCOUNTER — Inpatient Hospital Stay (HOSPITAL_COMMUNITY): Payer: Medicare Other

## 2021-08-08 ENCOUNTER — Inpatient Hospital Stay (HOSPITAL_COMMUNITY): Payer: Medicare Other

## 2021-08-08 ENCOUNTER — Encounter (HOSPITAL_COMMUNITY): Payer: Self-pay

## 2021-08-08 ENCOUNTER — Other Ambulatory Visit: Payer: Self-pay

## 2021-08-08 VITALS — BP 126/65 | HR 84 | Temp 98.9°F | Resp 18

## 2021-08-08 DIAGNOSIS — Z87891 Personal history of nicotine dependence: Secondary | ICD-10-CM | POA: Diagnosis not present

## 2021-08-08 DIAGNOSIS — N183 Chronic kidney disease, stage 3 unspecified: Secondary | ICD-10-CM

## 2021-08-08 DIAGNOSIS — D500 Iron deficiency anemia secondary to blood loss (chronic): Secondary | ICD-10-CM

## 2021-08-08 DIAGNOSIS — D649 Anemia, unspecified: Secondary | ICD-10-CM

## 2021-08-08 DIAGNOSIS — D631 Anemia in chronic kidney disease: Secondary | ICD-10-CM | POA: Diagnosis not present

## 2021-08-08 LAB — CBC
HCT: 25.9 % — ABNORMAL LOW (ref 39.0–52.0)
Hemoglobin: 8.4 g/dL — ABNORMAL LOW (ref 13.0–17.0)
MCH: 29.3 pg (ref 26.0–34.0)
MCHC: 32.4 g/dL (ref 30.0–36.0)
MCV: 90.2 fL (ref 80.0–100.0)
Platelets: 170 10*3/uL (ref 150–400)
RBC: 2.87 MIL/uL — ABNORMAL LOW (ref 4.22–5.81)
RDW: 20.7 % — ABNORMAL HIGH (ref 11.5–15.5)
WBC: 6.2 10*3/uL (ref 4.0–10.5)
nRBC: 0.3 % — ABNORMAL HIGH (ref 0.0–0.2)

## 2021-08-08 MED ORDER — EPOETIN ALFA-EPBX 20000 UNIT/ML IJ SOLN
40000.0000 [IU] | Freq: Once | INTRAMUSCULAR | Status: AC
  Administered 2021-08-08: 40000 [IU] via SUBCUTANEOUS
  Filled 2021-08-08: qty 2

## 2021-08-08 NOTE — Progress Notes (Signed)
Patient presents today for Retacrit injection. Hemoglobin reviewed prior to administration. VSS. Injection tolerated without incident or complaint. See MAR for details. Patient stable during and after injection.  Patient discharged in satisfactory condition with no s/s of distress noted.    

## 2021-08-08 NOTE — Patient Instructions (Signed)
[facility]  Discharge Instructions: Thank you for choosing [facility] to provide your oncology and hematology care.  If you have a lab appointment with the Cancer Center, please come in thru the Main Entrance and check in at the main information desk.  Wear comfortable clothing and clothing appropriate for easy access to any Portacath or PICC line.   We strive to give you quality time with your provider. You may need to reschedule your appointment if you arrive late (15 or more minutes).  Arriving late affects you and other patients whose appointments are after yours.  Also, if you miss three or more appointments without notifying the office, you may be dismissed from the clinic at the provider's discretion.      For prescription refill requests, have your pharmacy contact our office and allow 72 hours for refills to be completed.    Today you received the following chemotherapy and/or immunotherapy agents retacrit.     To help prevent nausea and vomiting after your treatment, we encourage you to take your nausea medication as directed.  BELOW ARE SYMPTOMS THAT SHOULD BE REPORTED IMMEDIATELY: *FEVER GREATER THAN 100.4 F (38 C) OR HIGHER *CHILLS OR SWEATING *NAUSEA AND VOMITING THAT IS NOT CONTROLLED WITH YOUR NAUSEA MEDICATION *UNUSUAL SHORTNESS OF BREATH *UNUSUAL BRUISING OR BLEEDING *URINARY PROBLEMS (pain or burning when urinating, or frequent urination) *BOWEL PROBLEMS (unusual diarrhea, constipation, pain near the anus) TENDERNESS IN MOUTH AND THROAT WITH OR WITHOUT PRESENCE OF ULCERS (sore throat, sores in mouth, or a toothache) UNUSUAL RASH, SWELLING OR PAIN  UNUSUAL VAGINAL DISCHARGE OR ITCHING   Items with * indicate a potential emergency and should be followed up as soon as possible or go to the Emergency Department if any problems should occur.  Please show the CHEMOTHERAPY ALERT CARD or IMMUNOTHERAPY ALERT CARD at check-in to the Emergency  Department and triage nurse.  Should you have questions after your visit or need to cancel or reschedule your appointment, please contact [facility] [phone]  and follow the prompts.  Office hours are 8:00 a.m. to 4:30 p.m. Monday - Friday. Please note that voicemails left after 4:00 p.m. may not be returned until the following business day.  We are closed weekends and major holidays. You have access to a nurse at all times for urgent questions. Please call the main number to the clinic [phone] and follow the prompts.  For any non-urgent questions, you may also contact your provider using MyChart. We now offer e-Visits for anyone 18 and older to request care online for non-urgent symptoms. For details visit [email or web address].   Also download the MyChart app! Go to the app store, search "MyChart", open the app, select [facility], and log in with your MyChart username and password.  Due to Covid, a mask is required upon entering the hospital/clinic. If you do not have a mask, one will be given to you upon arrival. For doctor visits, patients may have 1 support person aged 18 or older with them. For treatment visits, patients cannot have anyone with them due to current Covid guidelines and our immunocompromised population.  

## 2021-08-20 ENCOUNTER — Encounter (HOSPITAL_COMMUNITY): Payer: Self-pay

## 2021-08-20 ENCOUNTER — Inpatient Hospital Stay (HOSPITAL_COMMUNITY): Payer: Medicare Other

## 2021-08-20 ENCOUNTER — Other Ambulatory Visit: Payer: Self-pay

## 2021-08-20 ENCOUNTER — Inpatient Hospital Stay (HOSPITAL_COMMUNITY): Payer: Medicare Other | Attending: Hematology

## 2021-08-20 VITALS — BP 160/81 | HR 82 | Temp 99.4°F | Resp 18

## 2021-08-20 DIAGNOSIS — D649 Anemia, unspecified: Secondary | ICD-10-CM

## 2021-08-20 DIAGNOSIS — N189 Chronic kidney disease, unspecified: Secondary | ICD-10-CM | POA: Insufficient documentation

## 2021-08-20 DIAGNOSIS — D500 Iron deficiency anemia secondary to blood loss (chronic): Secondary | ICD-10-CM

## 2021-08-20 DIAGNOSIS — N183 Chronic kidney disease, stage 3 unspecified: Secondary | ICD-10-CM

## 2021-08-20 DIAGNOSIS — D631 Anemia in chronic kidney disease: Secondary | ICD-10-CM | POA: Diagnosis not present

## 2021-08-20 LAB — CBC
HCT: 28.3 % — ABNORMAL LOW (ref 39.0–52.0)
Hemoglobin: 9.2 g/dL — ABNORMAL LOW (ref 13.0–17.0)
MCH: 30 pg (ref 26.0–34.0)
MCHC: 32.5 g/dL (ref 30.0–36.0)
MCV: 92.2 fL (ref 80.0–100.0)
Platelets: 266 10*3/uL (ref 150–400)
RBC: 3.07 MIL/uL — ABNORMAL LOW (ref 4.22–5.81)
RDW: 21.3 % — ABNORMAL HIGH (ref 11.5–15.5)
WBC: 4.8 10*3/uL (ref 4.0–10.5)
nRBC: 0 % (ref 0.0–0.2)

## 2021-08-20 MED ORDER — EPOETIN ALFA-EPBX 20000 UNIT/ML IJ SOLN
40000.0000 [IU] | Freq: Once | INTRAMUSCULAR | Status: AC
  Administered 2021-08-20: 40000 [IU] via SUBCUTANEOUS
  Filled 2021-08-20: qty 2

## 2021-08-20 NOTE — Progress Notes (Signed)
Roger Hansen presents today for injection per the provider's orders.  Retacrit 40000 units in right arm administration without incident; injection site WNL; see MAR for injection details.  Patient tolerated procedure well and without incident.  No questions or complaints noted at this time. Stable during and after injection.  Vital signs WNL.  AVS reviewed.  Discharged in stable condition ambulatory.

## 2021-08-20 NOTE — Patient Instructions (Signed)
[facility]  Discharge Instructions: Thank you for choosing [facility] to provide your oncology and hematology care.  If you have a lab appointment with the Cancer Center, please come in thru the Main Entrance and check in at the main information desk.  Wear comfortable clothing and clothing appropriate for easy access to any Portacath or PICC line.   We strive to give you quality time with your provider. You may need to reschedule your appointment if you arrive late (15 or more minutes).  Arriving late affects you and other patients whose appointments are after yours.  Also, if you miss three or more appointments without notifying the office, you may be dismissed from the clinic at the provider's discretion.      For prescription refill requests, have your pharmacy contact our office and allow 72 hours for refills to be completed.    Today you received the following chemotherapy and/or immunotherapy agents retacrit.     To help prevent nausea and vomiting after your treatment, we encourage you to take your nausea medication as directed.  BELOW ARE SYMPTOMS THAT SHOULD BE REPORTED IMMEDIATELY: *FEVER GREATER THAN 100.4 F (38 C) OR HIGHER *CHILLS OR SWEATING *NAUSEA AND VOMITING THAT IS NOT CONTROLLED WITH YOUR NAUSEA MEDICATION *UNUSUAL SHORTNESS OF BREATH *UNUSUAL BRUISING OR BLEEDING *URINARY PROBLEMS (pain or burning when urinating, or frequent urination) *BOWEL PROBLEMS (unusual diarrhea, constipation, pain near the anus) TENDERNESS IN MOUTH AND THROAT WITH OR WITHOUT PRESENCE OF ULCERS (sore throat, sores in mouth, or a toothache) UNUSUAL RASH, SWELLING OR PAIN  UNUSUAL VAGINAL DISCHARGE OR ITCHING   Items with * indicate a potential emergency and should be followed up as soon as possible or go to the Emergency Department if any problems should occur.  Please show the CHEMOTHERAPY ALERT CARD or IMMUNOTHERAPY ALERT CARD at check-in to the Emergency  Department and triage nurse.  Should you have questions after your visit or need to cancel or reschedule your appointment, please contact [facility] [phone]  and follow the prompts.  Office hours are 8:00 a.m. to 4:30 p.m. Monday - Friday. Please note that voicemails left after 4:00 p.m. may not be returned until the following business day.  We are closed weekends and major holidays. You have access to a nurse at all times for urgent questions. Please call the main number to the clinic [phone] and follow the prompts.  For any non-urgent questions, you may also contact your provider using MyChart. We now offer e-Visits for anyone 18 and older to request care online for non-urgent symptoms. For details visit [email or web address].   Also download the MyChart app! Go to the app store, search "MyChart", open the app, select [facility], and log in with your MyChart username and password.  Due to Covid, a mask is required upon entering the hospital/clinic. If you do not have a mask, one will be given to you upon arrival. For doctor visits, patients may have 1 support person aged 18 or older with them. For treatment visits, patients cannot have anyone with them due to current Covid guidelines and our immunocompromised population.  

## 2021-09-03 ENCOUNTER — Inpatient Hospital Stay (HOSPITAL_COMMUNITY): Payer: Medicare Other

## 2021-09-03 ENCOUNTER — Other Ambulatory Visit: Payer: Self-pay

## 2021-09-03 ENCOUNTER — Encounter (HOSPITAL_COMMUNITY): Payer: Self-pay

## 2021-09-03 VITALS — BP 128/64 | HR 81 | Temp 96.7°F | Resp 17 | Wt 180.1 lb

## 2021-09-03 DIAGNOSIS — D649 Anemia, unspecified: Secondary | ICD-10-CM

## 2021-09-03 DIAGNOSIS — N183 Chronic kidney disease, stage 3 unspecified: Secondary | ICD-10-CM

## 2021-09-03 DIAGNOSIS — D500 Iron deficiency anemia secondary to blood loss (chronic): Secondary | ICD-10-CM

## 2021-09-03 DIAGNOSIS — N189 Chronic kidney disease, unspecified: Secondary | ICD-10-CM | POA: Diagnosis not present

## 2021-09-03 DIAGNOSIS — D631 Anemia in chronic kidney disease: Secondary | ICD-10-CM | POA: Diagnosis not present

## 2021-09-03 LAB — CBC
HCT: 27 % — ABNORMAL LOW (ref 39.0–52.0)
Hemoglobin: 8.8 g/dL — ABNORMAL LOW (ref 13.0–17.0)
MCH: 30.4 pg (ref 26.0–34.0)
MCHC: 32.6 g/dL (ref 30.0–36.0)
MCV: 93.4 fL (ref 80.0–100.0)
Platelets: 120 10*3/uL — ABNORMAL LOW (ref 150–400)
RBC: 2.89 MIL/uL — ABNORMAL LOW (ref 4.22–5.81)
RDW: 20.7 % — ABNORMAL HIGH (ref 11.5–15.5)
WBC: 5 10*3/uL (ref 4.0–10.5)
nRBC: 0 % (ref 0.0–0.2)

## 2021-09-03 MED ORDER — EPOETIN ALFA-EPBX 40000 UNIT/ML IJ SOLN
40000.0000 [IU] | Freq: Once | INTRAMUSCULAR | Status: AC
  Administered 2021-09-03: 40000 [IU] via SUBCUTANEOUS
  Filled 2021-09-03: qty 1

## 2021-09-03 NOTE — Progress Notes (Signed)
Patient presents today for Retacrit injection. Hemoglobin reviewed prior to administration. VSS tolerated without incident or complaint. See MAR for details. Patient stable during and after injection. Patient discharged in satisfactory condition with no s/s of distress noted.  

## 2021-09-03 NOTE — Patient Instructions (Signed)
[facility]  Discharge Instructions: Thank you for choosing [facility] to provide your oncology and hematology care.  If you have a lab appointment with the [facility], please come in thru the Main Entrance and check in at the main information desk.  Wear comfortable clothing and clothing appropriate for easy access to any Portacath or PICC line.   We strive to give you quality time with your provider. You may need to reschedule your appointment if you arrive late (15 or more minutes).  Arriving late affects you and other patients whose appointments are after yours.  Also, if you miss three or more appointments without notifying the office, you may be dismissed from the clinic at the provider's discretion.      For prescription refill requests, have your pharmacy contact our office and allow 72 hours for refills to be completed.    Today you received the following Retacrit. Return as scheduled.   To help prevent nausea and vomiting after your treatment, we encourage you to take your nausea medication as directed.  BELOW ARE SYMPTOMS THAT SHOULD BE REPORTED IMMEDIATELY: *FEVER GREATER THAN 100.4 F (38 C) OR HIGHER *CHILLS OR SWEATING *NAUSEA AND VOMITING THAT IS NOT CONTROLLED WITH YOUR NAUSEA MEDICATION *UNUSUAL SHORTNESS OF BREATH *UNUSUAL BRUISING OR BLEEDING *URINARY PROBLEMS (pain or burning when urinating, or frequent urination) *BOWEL PROBLEMS (unusual diarrhea, constipation, pain near the anus) TENDERNESS IN MOUTH AND THROAT WITH OR WITHOUT PRESENCE OF ULCERS (sore throat, sores in mouth, or a toothache) UNUSUAL RASH, SWELLING OR PAIN  UNUSUAL VAGINAL DISCHARGE OR ITCHING   Items with * indicate a potential emergency and should be followed up as soon as possible or go to the Emergency Department if any problems should occur.  Please show the CHEMOTHERAPY ALERT CARD or IMMUNOTHERAPY ALERT CARD at check-in to the Emergency Department and triage  nurse.  Should you have questions after your visit or need to cancel or reschedule your appointment, please contact [facility] ([phone]  and follow the prompts.  Office hours are 8:00 a.m. to 4:30 p.m. Monday - Friday. Please note that voicemails left after 4:00 p.m. may not be returned until the following business day.  We are closed weekends and major holidays. You have access to a nurse at all times for urgent questions. Please call the main number to the clinic [phone] and follow the prompts.  For any non-urgent questions, you may also contact your provider using MyChart. We now offer e-Visits for anyone 73 and older to request care online for non-urgent symptoms. For details visit [email or web address].   Also download the MyChart app! Go to the app store, search "MyChart", open the app, select [facility], and log in with your MyChart username and password.  Due to Covid, a mask is required upon entering the hospital/clinic. If you do not have a mask, one will be given to you upon arrival. For doctor visits, patients may have 1 support person aged 73 or older with them. For treatment visits, patients cannot have anyone with them due to current Covid guidelines and our immunocompromised population.

## 2021-09-17 ENCOUNTER — Inpatient Hospital Stay (HOSPITAL_COMMUNITY): Payer: Medicare Other | Attending: Hematology

## 2021-09-17 ENCOUNTER — Inpatient Hospital Stay (HOSPITAL_COMMUNITY): Payer: Medicare Other

## 2021-09-17 DIAGNOSIS — N189 Chronic kidney disease, unspecified: Secondary | ICD-10-CM | POA: Insufficient documentation

## 2021-09-17 DIAGNOSIS — D631 Anemia in chronic kidney disease: Secondary | ICD-10-CM | POA: Insufficient documentation

## 2021-09-17 DIAGNOSIS — E611 Iron deficiency: Secondary | ICD-10-CM | POA: Insufficient documentation

## 2021-09-17 DIAGNOSIS — N1831 Chronic kidney disease, stage 3a: Secondary | ICD-10-CM | POA: Insufficient documentation

## 2021-09-17 DIAGNOSIS — D509 Iron deficiency anemia, unspecified: Secondary | ICD-10-CM | POA: Insufficient documentation

## 2021-09-19 ENCOUNTER — Other Ambulatory Visit (HOSPITAL_COMMUNITY): Payer: Self-pay | Admitting: *Deleted

## 2021-09-19 DIAGNOSIS — N183 Chronic kidney disease, stage 3 unspecified: Secondary | ICD-10-CM

## 2021-09-19 DIAGNOSIS — D649 Anemia, unspecified: Secondary | ICD-10-CM

## 2021-09-20 ENCOUNTER — Other Ambulatory Visit: Payer: Self-pay

## 2021-09-20 ENCOUNTER — Inpatient Hospital Stay (HOSPITAL_COMMUNITY): Payer: Medicare Other

## 2021-09-20 VITALS — BP 123/66 | HR 83 | Temp 98.9°F | Resp 18

## 2021-09-20 DIAGNOSIS — N183 Chronic kidney disease, stage 3 unspecified: Secondary | ICD-10-CM

## 2021-09-20 DIAGNOSIS — E611 Iron deficiency: Secondary | ICD-10-CM | POA: Diagnosis not present

## 2021-09-20 DIAGNOSIS — N189 Chronic kidney disease, unspecified: Secondary | ICD-10-CM | POA: Diagnosis present

## 2021-09-20 DIAGNOSIS — D509 Iron deficiency anemia, unspecified: Secondary | ICD-10-CM | POA: Diagnosis not present

## 2021-09-20 DIAGNOSIS — D631 Anemia in chronic kidney disease: Secondary | ICD-10-CM | POA: Diagnosis not present

## 2021-09-20 DIAGNOSIS — D649 Anemia, unspecified: Secondary | ICD-10-CM

## 2021-09-20 DIAGNOSIS — N1831 Chronic kidney disease, stage 3a: Secondary | ICD-10-CM | POA: Diagnosis not present

## 2021-09-20 LAB — COMPREHENSIVE METABOLIC PANEL
ALT: 24 U/L (ref 0–44)
AST: 68 U/L — ABNORMAL HIGH (ref 15–41)
Albumin: 3.5 g/dL (ref 3.5–5.0)
Alkaline Phosphatase: 73 U/L (ref 38–126)
Anion gap: 9 (ref 5–15)
BUN: 12 mg/dL (ref 8–23)
CO2: 32 mmol/L (ref 22–32)
Calcium: 6.8 mg/dL — ABNORMAL LOW (ref 8.9–10.3)
Chloride: 95 mmol/L — ABNORMAL LOW (ref 98–111)
Creatinine, Ser: 1.75 mg/dL — ABNORMAL HIGH (ref 0.61–1.24)
GFR, Estimated: 41 mL/min — ABNORMAL LOW (ref >60)
Glucose, Bld: 98 mg/dL (ref 70–99)
Potassium: 3.3 mmol/L — ABNORMAL LOW (ref 3.5–5.1)
Sodium: 136 mmol/L (ref 135–145)
Total Bilirubin: 0.9 mg/dL (ref 0.3–1.2)
Total Protein: 6.2 g/dL — ABNORMAL LOW (ref 6.5–8.1)

## 2021-09-20 LAB — CBC WITH DIFFERENTIAL/PLATELET
Abs Immature Granulocytes: 0.01 10*3/uL (ref 0.00–0.07)
Basophils Absolute: 0 10*3/uL (ref 0.0–0.1)
Basophils Relative: 1 %
Eosinophils Absolute: 0.1 10*3/uL (ref 0.0–0.5)
Eosinophils Relative: 3 %
HCT: 25.7 % — ABNORMAL LOW (ref 39.0–52.0)
Hemoglobin: 8.2 g/dL — ABNORMAL LOW (ref 13.0–17.0)
Immature Granulocytes: 0 %
Lymphocytes Relative: 28 %
Lymphs Abs: 1.1 10*3/uL (ref 0.7–4.0)
MCH: 29.8 pg (ref 26.0–34.0)
MCHC: 31.9 g/dL (ref 30.0–36.0)
MCV: 93.5 fL (ref 80.0–100.0)
Monocytes Absolute: 0.4 10*3/uL (ref 0.1–1.0)
Monocytes Relative: 10 %
Neutro Abs: 2.4 10*3/uL (ref 1.7–7.7)
Neutrophils Relative %: 58 %
Platelets: 129 10*3/uL — ABNORMAL LOW (ref 150–400)
RBC: 2.75 MIL/uL — ABNORMAL LOW (ref 4.22–5.81)
RDW: 19.2 % — ABNORMAL HIGH (ref 11.5–15.5)
WBC: 4 10*3/uL (ref 4.0–10.5)
nRBC: 0 % (ref 0.0–0.2)

## 2021-09-20 LAB — IRON AND TIBC
Iron: 16 ug/dL — ABNORMAL LOW (ref 45–182)
Saturation Ratios: 9 % — ABNORMAL LOW (ref 17.9–39.5)
TIBC: 184 ug/dL — ABNORMAL LOW (ref 250–450)
UIBC: 168 ug/dL

## 2021-09-20 LAB — FERRITIN: Ferritin: 1070 ng/mL — ABNORMAL HIGH (ref 24–336)

## 2021-09-20 MED ORDER — EPOETIN ALFA-EPBX 40000 UNIT/ML IJ SOLN
40000.0000 [IU] | Freq: Once | INTRAMUSCULAR | Status: AC
  Administered 2021-09-20: 40000 [IU] via SUBCUTANEOUS
  Filled 2021-09-20: qty 1

## 2021-09-20 NOTE — Progress Notes (Signed)
BOBIE KISTLER presents today for injection per the provider's orders.  Retacrit administration without incident; injection site WNL; see MAR for injection details.  Patient tolerated procedure well and without incident.  No questions or complaints noted at this time.  Retacrit injection given today per MD orders. Tolerated infusion without adverse affects. Vital signs stable. No complaints at this time. Discharged from clinic ambulatory in stable condition. Alert and oriented x 3. F/U with [facility] as scheduled.

## 2021-09-20 NOTE — Patient Instructions (Signed)
[facility]  Discharge Instructions: Thank you for choosing [facility] to provide your oncology and hematology care.  If you have a lab appointment with the [facility], please come in thru the Main Entrance and check in at the main information desk.  Wear comfortable clothing and clothing appropriate for easy access to any Portacath or PICC line.   We strive to give you quality time with your provider. You may need to reschedule your appointment if you arrive late (15 or more minutes).  Arriving late affects you and other patients whose appointments are after yours.  Also, if you miss three or more appointments without notifying the office, you may be dismissed from the clinic at the provider's discretion.      For prescription refill requests, have your pharmacy contact our office and allow 72 hours for refills to be completed.    Today you received Retacrit injection.   BELOW ARE SYMPTOMS THAT SHOULD BE REPORTED IMMEDIATELY: *FEVER GREATER THAN 100.4 F (38 C) OR HIGHER *CHILLS OR SWEATING *NAUSEA AND VOMITING THAT IS NOT CONTROLLED WITH YOUR NAUSEA MEDICATION *UNUSUAL SHORTNESS OF BREATH *UNUSUAL BRUISING OR BLEEDING *URINARY PROBLEMS (pain or burning when urinating, or frequent urination) *BOWEL PROBLEMS (unusual diarrhea, constipation, pain near the anus) TENDERNESS IN MOUTH AND THROAT WITH OR WITHOUT PRESENCE OF ULCERS (sore throat, sores in mouth, or a toothache) UNUSUAL RASH, SWELLING OR PAIN  UNUSUAL VAGINAL DISCHARGE OR ITCHING   Items with * indicate a potential emergency and should be followed up as soon as possible or go to the Emergency Department if any problems should occur.  Please show the CHEMOTHERAPY ALERT CARD or IMMUNOTHERAPY ALERT CARD at check-in to the Emergency Department and triage nurse.  Should you have questions after your visit or need to cancel or reschedule your appointment, please contact [facility]  [phone]  and follow the prompts.  Office hours are 8:00 a.m. to 4:30 p.m. Monday - Friday. Please note that voicemails left after 4:00 p.m. may not be returned until the following business day.  We are closed weekends and major holidays. You have access to a nurse at all times for urgent questions. Please call the main number to the clinic [phone] and follow the prompts.  For any non-urgent questions, you may also contact your provider using MyChart. We now offer e-Visits for anyone 2 and older to request care online for non-urgent symptoms. For details visit [email or web address].   Also download the MyChart app! Go to the app store, search "MyChart", open the app, select [facility], and log in with your MyChart username and password.  Due to Covid, a mask is required upon entering the hospital/clinic. If you do not have a mask, one will be given to you upon arrival. For doctor visits, patients may have 1 support person aged 69 or older with them. For treatment visits, patients cannot have anyone with them due to current Covid guidelines and our immunocompromised population.

## 2021-09-23 LAB — KAPPA/LAMBDA LIGHT CHAINS
Kappa free light chain: 74.9 mg/L — ABNORMAL HIGH (ref 3.3–19.4)
Kappa, lambda light chain ratio: 1.38 (ref 0.26–1.65)
Lambda free light chains: 54.1 mg/L — ABNORMAL HIGH (ref 5.7–26.3)

## 2021-09-24 LAB — PROTEIN ELECTROPHORESIS, SERUM
A/G Ratio: 1.5 (ref 0.7–1.7)
Albumin ELP: 3.5 g/dL (ref 2.9–4.4)
Alpha-1-Globulin: 0.2 g/dL (ref 0.0–0.4)
Alpha-2-Globulin: 0.5 g/dL (ref 0.4–1.0)
Beta Globulin: 0.8 g/dL (ref 0.7–1.3)
Gamma Globulin: 0.8 g/dL (ref 0.4–1.8)
Globulin, Total: 2.3 g/dL (ref 2.2–3.9)
Total Protein ELP: 5.8 g/dL — ABNORMAL LOW (ref 6.0–8.5)

## 2021-10-01 ENCOUNTER — Other Ambulatory Visit (HOSPITAL_COMMUNITY): Payer: Medicare Other

## 2021-10-01 ENCOUNTER — Ambulatory Visit (HOSPITAL_COMMUNITY): Payer: Medicare Other

## 2021-10-01 ENCOUNTER — Ambulatory Visit (HOSPITAL_COMMUNITY): Payer: Medicare Other | Admitting: Physician Assistant

## 2021-10-04 DIAGNOSIS — E782 Mixed hyperlipidemia: Secondary | ICD-10-CM | POA: Diagnosis not present

## 2021-10-04 DIAGNOSIS — N183 Chronic kidney disease, stage 3 unspecified: Secondary | ICD-10-CM | POA: Diagnosis not present

## 2021-10-04 DIAGNOSIS — E7849 Other hyperlipidemia: Secondary | ICD-10-CM | POA: Diagnosis not present

## 2021-10-06 ENCOUNTER — Other Ambulatory Visit (HOSPITAL_COMMUNITY): Payer: Self-pay | Admitting: Physician Assistant

## 2021-10-06 DIAGNOSIS — D696 Thrombocytopenia, unspecified: Secondary | ICD-10-CM

## 2021-10-06 NOTE — Progress Notes (Signed)
Virtual Visit Note [facility]  I connected with Jaquelyn Bitter  on 10/07/21 at  9:19 AM  by virtual/video visit and verified that I am speaking with the correct person using two identifiers.  Location: Patient: Roger Hansen Provider: Home Office   I discussed the limitations, risks, security and privacy concerns of performing an evaluation and management service by telephone and the availability of in person appointments. I also discussed with the patient that there may be a patient responsible charge related to this service. The patient expressed understanding and agreed to proceed.    REASON FOR VISIT:  Follow-up for anemia of CKD and iron deficiency   PRIOR THERAPY: None   CURRENT THERAPY: Retacrit injections every 2 weeks; intermittent parenteral iron therapy; oral iron supplement  INTERVAL HISTORY:  Mr. Stein 75 y.o. 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  He was last seen by Tarri Abernethy PA-C on 06/25/2021.  At today's visit, he reports feeling like his normal self.  No recent hospitalizations, surgeries, or changes in baseline health status.  He reports that he is tolerating Retacrit injections well, and he continues to take daily oral iron supplementation.  He does report that he has soft and dark-colored bowel movements several times per week, but denies having them every day.  He does not have any bright red blood per rectum, epistaxis, or hematemesis.  He is symptomatic with baseline fatigue (energy about 70%), lightheadedness, headaches, and pica cravings for ice.  He denies any dyspnea on exertion or syncope.  He continues to have intermittent chest pain related to panic attacks/PTSD, and reports that his cardiologist is aware and has prescribed him nitroglycerin tablets.  He has 70% energy and 100% appetite. He endorses that he is maintaining a stable  weight.   OBSERVATIONS/OBJECTIVE: Review of Systems  Constitutional:  Positive for malaise/fatigue (energy 70%). Negative for chills, diaphoresis, fever and weight loss.  Respiratory:  Negative for cough and shortness of breath.   Cardiovascular:  Positive for chest pain (occasional). Negative for palpitations.  Gastrointestinal:  Positive for constipation and diarrhea. Negative for abdominal pain, blood in stool, melena, nausea and vomiting.  Musculoskeletal:  Positive for back pain.  Neurological:  Positive for dizziness, tingling (neuropathy) and headaches.  Psychiatric/Behavioral:  Positive for depression. The patient is nervous/anxious and has insomnia.     PHYSICAL EXAM (per limitations of virtual telephone visit): The patient is alert and oriented x 3, exhibiting adequate mentation, good mood, and ability to speak in full sentences and execute sound judgement.   ASSESSMENT & PLAN: 1.   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  The bone marrow showed slightly hypercellular marrow with trilineage hematopoiesis.  Chromosome analysis was normal. - He has been receiving intermittent Epogen and parenteral iron therapy since June 2020. - He is a Jehovah witness and does not receive blood products. - Last colonoscopy and EGD on 06/03/2019: Mild esophagitis and duodenitis on EGD, polyps and external hemorrhoids on colonoscopy - He is tolerating Epogen 40,000 units every 2 weeks very well. - He continues to take iron tablets 325 mg once daily. - He reports dark bowel movements several times per week.  He denies any bright red blood per rectum, hematemesis, or epistaxis. - Most  recent CBC (10/07/2021): Hgb 7.9/MCV 95.5 - Iron panel (09/20/2021): Consistent with anemia of chronic disease and functional  iron deficiency, with elevated ferritin 1070 but low serum iron 16, low iron saturation 9%, and low TIBC 184 - Folate and B12 are normal (10/07/2021), but with homocystine and methylmalonic acid pending - MGUS/myeloma panel (09/20/2021) is negative (no M spike on SPEP, immunofixation negative for monoclonal protein).  Elevated kappa (74.9) and lambda (54.1) light chains in the setting of chronic kidney disease, both normal light chain ratio 1.38. - Differential diagnosis includes anemia related to mild CKD, functional iron deficiency, and anemia of chronic disease.  Patient may also have some occult GI blood loss. - Unable to exclude early MDS, but will consider bone marrow biopsy in the future if there are any significant deviations from baseline - History of previous heavy alcohol abuse may be a contributing factor to anemia given its bone marrow toxicity - PLAN: Check stool cards x3 due to downtrending Hgb  - Increase Retacrit to 40,000 units weekly - We will give Venofer 200 mg IV infusion every other week on same day as Retacrit injection due to significant functional iron deficiency.    - Repeat labs (CBC, CMP, iron/TIBC, ferritin) and RTC in 4 weeks  2. AKI on CKD stage II - Most recent labs (09/20/2021): Creatinine 1.75, slightly elevated from baseline - Patient instructed to discuss with PCP regarding elevated creatinine, may need referral to nephrology in the future, at PCP discretion.  Avoid NSAIDs and maintain adequate hydration.  3. Thrombocytopenia, mild - Mild thrombocytopenia noted on most recent CBC (09/20/2021) with platelets 129 - PLAN: Check labs TODAY with B12/methylmalonic acid, folate.  We will consider additional work-up in the future if persistent thrombocytopenia.   FOLLOW UP INSTRUCTIONS: - Stool cards x3 - Venofer 200 mg every other week x2 doses - Retacrit 40,000 units weekly - Weekly CBC - RTC in 4 weeks with repeat CBC, iron panel, and CMP    I discussed the  assessment and treatment plan with the patient. The patient was provided an opportunity to ask questions and all were answered. The patient agreed with the plan and demonstrated an understanding of the instructions.   The patient was advised to call back or seek an in-person evaluation if the symptoms worsen or if the condition fails to improve as anticipated.  I provided 28 minutes of non-face-to-face time during this encounter.   Harriett Rush, PA-C 10/07/2021 10:04 AM

## 2021-10-07 ENCOUNTER — Inpatient Hospital Stay (HOSPITAL_COMMUNITY): Payer: Medicare Other

## 2021-10-07 ENCOUNTER — Inpatient Hospital Stay (HOSPITAL_BASED_OUTPATIENT_CLINIC_OR_DEPARTMENT_OTHER): Payer: Medicare Other | Admitting: Physician Assistant

## 2021-10-07 ENCOUNTER — Other Ambulatory Visit: Payer: Self-pay

## 2021-10-07 VITALS — BP 136/77 | HR 72 | Temp 97.0°F | Resp 18 | Wt 183.6 lb

## 2021-10-07 DIAGNOSIS — D696 Thrombocytopenia, unspecified: Secondary | ICD-10-CM

## 2021-10-07 DIAGNOSIS — D500 Iron deficiency anemia secondary to blood loss (chronic): Secondary | ICD-10-CM

## 2021-10-07 DIAGNOSIS — D649 Anemia, unspecified: Secondary | ICD-10-CM

## 2021-10-07 DIAGNOSIS — D631 Anemia in chronic kidney disease: Secondary | ICD-10-CM

## 2021-10-07 DIAGNOSIS — N183 Chronic kidney disease, stage 3 unspecified: Secondary | ICD-10-CM

## 2021-10-07 DIAGNOSIS — N189 Chronic kidney disease, unspecified: Secondary | ICD-10-CM | POA: Diagnosis not present

## 2021-10-07 DIAGNOSIS — N182 Chronic kidney disease, stage 2 (mild): Secondary | ICD-10-CM | POA: Diagnosis not present

## 2021-10-07 DIAGNOSIS — N1831 Chronic kidney disease, stage 3a: Secondary | ICD-10-CM | POA: Diagnosis not present

## 2021-10-07 DIAGNOSIS — E611 Iron deficiency: Secondary | ICD-10-CM | POA: Diagnosis not present

## 2021-10-07 LAB — CBC
HCT: 25.3 % — ABNORMAL LOW (ref 39.0–52.0)
Hemoglobin: 7.9 g/dL — ABNORMAL LOW (ref 13.0–17.0)
MCH: 29.8 pg (ref 26.0–34.0)
MCHC: 31.2 g/dL (ref 30.0–36.0)
MCV: 95.5 fL (ref 80.0–100.0)
Platelets: 197 10*3/uL (ref 150–400)
RBC: 2.65 MIL/uL — ABNORMAL LOW (ref 4.22–5.81)
RDW: 19 % — ABNORMAL HIGH (ref 11.5–15.5)
WBC: 3.5 10*3/uL — ABNORMAL LOW (ref 4.0–10.5)
nRBC: 0 % (ref 0.0–0.2)

## 2021-10-07 LAB — VITAMIN B12: Vitamin B-12: 433 pg/mL (ref 180–914)

## 2021-10-07 LAB — FOLATE: Folate: 14.4 ng/mL (ref >5.9)

## 2021-10-07 MED ORDER — EPOETIN ALFA-EPBX 40000 UNIT/ML IJ SOLN
40000.0000 [IU] | Freq: Once | INTRAMUSCULAR | Status: AC
  Administered 2021-10-07: 40000 [IU] via SUBCUTANEOUS
  Filled 2021-10-07: qty 1

## 2021-10-07 NOTE — Progress Notes (Signed)
Roger Hansen presents today for Retacrit injection per the provider's orders.  Stable during administration without incident; injection site WNL; see MAR for injection details.  Patient tolerated procedure well and without incident.  No questions or complaints noted at this time.  Treatment given today per MD orders.  Discharge from clinic ambulatory in stable condition.  Alert and oriented X 3.  Follow up with [facility] as scheduled.

## 2021-10-07 NOTE — Patient Instructions (Signed)
[place] at [facility] Discharge Instructions  You were seen today by Tarri Abernethy PA-C for your anemia.  Your blood counts have been trending downward, which is concerning for possible blood loss.  We will check STOOL CARDS x3 to see if you have any blood in your bowel movements.  We will increase your Retacrit to WEEKLY injections.  We will also schedule you for IV iron x2 doses (scheduled 2 weeks apart).  Seek IMMEDIATE medical attention if you notice large amounts of black and tarry bowel movements, as this can be a sign of bleeding from your intestines.  You should also seek immediate medical attention if you have symptoms of worsening blood counts with chest pain, difficulty breathing, lightheadedness, passing out, or extreme weakness.  LABS: Weekly labs before your Retacrit injections  MEDICATIONS: No changes to home medications.  FOLLOW-UP APPOINTMENT: Office visit in 4 weeks   Thank you for choosing [place] at [facility] to provide your oncology and hematology care.  To afford each patient quality time with our provider, please arrive at least 15 minutes before your scheduled appointment time.   If you have a lab appointment with the [facility] please come in thru the Main Entrance and check in at the main information desk.  You need to re-schedule your appointment should you arrive 10 or more minutes late.  We strive to give you quality time with our providers, and arriving late affects you and other patients whose appointments are after yours.  Also, if you no show three or more times for appointments you may be dismissed from the clinic at the providers discretion.     Again, thank you for choosing [facility].  Our hope is that these requests will decrease the amount of time that you wait before being seen by our physicians.       _____________________________________________________________  Should  you have questions after your visit to [facility], please contact our office at [phone] and follow the prompts.  Our office hours are 8:00 a.m. and 4:30 p.m. Monday - Friday.  Please note that voicemails left after 4:00 p.m. may not be returned until the following business day.  We are closed weekends and major holidays.  You do have access to a nurse 24-7, just call the main number to the clinic [phone] and do not press any options, hold on the line and a nurse will answer the phone.    For prescription refill requests, have your pharmacy contact our office and allow 72 hours.    Due to Covid, you will need to wear a mask upon entering the hospital. If you do not have a mask, a mask will be given to you at the Main Entrance upon arrival. For doctor visits, patients may have 1 support person age 73 or older with them. For treatment visits, patients can not have anyone with them due to social distancing guidelines and our immunocompromised population.

## 2021-10-07 NOTE — Patient Instructions (Signed)
[facility]  Discharge Instructions: Thank you for choosing [facility] to provide your oncology and hematology care.  If you have a lab appointment with the Cancer Center, please come in thru the Main Entrance and check in at the main information desk.  Wear comfortable clothing and clothing appropriate for easy access to any Portacath or PICC line.   We strive to give you quality time with your provider. You may need to reschedule your appointment if you arrive late (15 or more minutes).  Arriving late affects you and other patients whose appointments are after yours.  Also, if you miss three or more appointments without notifying the office, you may be dismissed from the clinic at the provider's discretion.      For prescription refill requests, have your pharmacy contact our office and allow 72 hours for refills to be completed.    Today you received the following chemotherapy and/or immunotherapy agents Retacrit      To help prevent nausea and vomiting after your treatment, we encourage you to take your nausea medication as directed.  BELOW ARE SYMPTOMS THAT SHOULD BE REPORTED IMMEDIATELY: *FEVER GREATER THAN 100.4 F (38 C) OR HIGHER *CHILLS OR SWEATING *NAUSEA AND VOMITING THAT IS NOT CONTROLLED WITH YOUR NAUSEA MEDICATION *UNUSUAL SHORTNESS OF BREATH *UNUSUAL BRUISING OR BLEEDING *URINARY PROBLEMS (pain or burning when urinating, or frequent urination) *BOWEL PROBLEMS (unusual diarrhea, constipation, pain near the anus) TENDERNESS IN MOUTH AND THROAT WITH OR WITHOUT PRESENCE OF ULCERS (sore throat, sores in mouth, or a toothache) UNUSUAL RASH, SWELLING OR PAIN  UNUSUAL VAGINAL DISCHARGE OR ITCHING   Items with * indicate a potential emergency and should be followed up as soon as possible or go to the Emergency Department if any problems should occur.  Please show the CHEMOTHERAPY ALERT CARD or IMMUNOTHERAPY ALERT CARD at check-in to the Emergency  Department and triage nurse.  Should you have questions after your visit or need to cancel or reschedule your appointment, please contact [facility] [phone]  and follow the prompts.  Office hours are 8:00 a.m. to 4:30 p.m. Monday - Friday. Please note that voicemails left after 4:00 p.m. may not be returned until the following business day.  We are closed weekends and major holidays. You have access to a nurse at all times for urgent questions. Please call the main number to the clinic [phone] and follow the prompts.  For any non-urgent questions, you may also contact your provider using MyChart. We now offer e-Visits for anyone 18 and older to request care online for non-urgent symptoms. For details visit [email or web address].   Also download the MyChart app! Go to the app store, search "MyChart", open the app, select [place], and log in with your MyChart username and password.  Due to Covid, a mask is required upon entering the hospital/clinic. If you do not have a mask, one will be given to you upon arrival. For doctor visits, patients may have 1 support person aged 18 or older with them. For treatment visits, patients cannot have anyone with them due to current Covid guidelines and our immunocompromised population.  

## 2021-10-08 LAB — HOMOCYSTEINE: Homocysteine: 27.9 umol/L — ABNORMAL HIGH (ref 0.0–19.2)

## 2021-10-11 ENCOUNTER — Other Ambulatory Visit: Payer: Self-pay

## 2021-10-11 ENCOUNTER — Inpatient Hospital Stay (HOSPITAL_COMMUNITY): Payer: Medicare Other

## 2021-10-11 DIAGNOSIS — I426 Alcoholic cardiomyopathy: Secondary | ICD-10-CM | POA: Diagnosis not present

## 2021-10-11 DIAGNOSIS — D6489 Other specified anemias: Secondary | ICD-10-CM | POA: Diagnosis not present

## 2021-10-11 DIAGNOSIS — Z23 Encounter for immunization: Secondary | ICD-10-CM | POA: Diagnosis not present

## 2021-10-11 DIAGNOSIS — I10 Essential (primary) hypertension: Secondary | ICD-10-CM | POA: Diagnosis not present

## 2021-10-11 DIAGNOSIS — I5032 Chronic diastolic (congestive) heart failure: Secondary | ICD-10-CM | POA: Diagnosis not present

## 2021-10-11 DIAGNOSIS — I700 Atherosclerosis of aorta: Secondary | ICD-10-CM | POA: Diagnosis not present

## 2021-10-11 DIAGNOSIS — E876 Hypokalemia: Secondary | ICD-10-CM | POA: Diagnosis not present

## 2021-10-11 DIAGNOSIS — E871 Hypo-osmolality and hyponatremia: Secondary | ICD-10-CM | POA: Diagnosis not present

## 2021-10-11 NOTE — Progress Notes (Signed)
After multiple unsuccessful attempts to get a peripheral iv, patient wants to come back next week and try again. Tarri Abernethy PA-C notified and ok to reschedule.

## 2021-10-11 NOTE — Patient Instructions (Signed)
[facility]  Discharge Instructions: Thank you for choosing [facility] to provide your oncology and hematology care.  If you have a lab appointment with the Cancer Center, please come in thru the Main Entrance and check in at the main information desk.     We strive to give you quality time with your provider. You may need to reschedule your appointment if you arrive late (15 or more minutes).  Arriving late affects you and other patients whose appointments are after yours.  Also, if you miss three or more appointments without notifying the office, you may be dismissed from the clinic at the provider's discretion.      For prescription refill requests, have your pharmacy contact our office and allow 72 hours for refills to be completed.     Should you have questions after your visit or need to cancel or reschedule your appointment, please contact [facility] [phone]  and follow the prompts.  Office hours are 8:00 a.m. to 4:30 p.m. Monday - Friday. Please note that voicemails left after 4:00 p.m. may not be returned until the following business day.  We are closed weekends and major holidays. You have access to a nurse at all times for urgent questions. Please call the main number to the clinic [phone] and follow the prompts.  For any non-urgent questions, you may also contact your provider using MyChart. We now offer e-Visits for anyone 18 and older to request care online for non-urgent symptoms. For details visit [email or web address].   Also download the MyChart app! Go to the app store, search "MyChart", open the app, select [place], and log in with your MyChart username and password.  Due to Covid, a mask is required upon entering the hospital/clinic. If you do not have a mask, one will be given to you upon arrival. For doctor visits, patients may have 1 support person aged 18 or older with them. For treatment visits, patients cannot have  anyone with them due to current Covid guidelines and our immunocompromised population.  

## 2021-10-14 ENCOUNTER — Other Ambulatory Visit (HOSPITAL_COMMUNITY): Payer: Medicare Other

## 2021-10-14 ENCOUNTER — Ambulatory Visit (HOSPITAL_COMMUNITY): Payer: Medicare Other

## 2021-10-14 ENCOUNTER — Other Ambulatory Visit: Payer: Self-pay

## 2021-10-14 ENCOUNTER — Inpatient Hospital Stay (HOSPITAL_COMMUNITY): Payer: Medicare Other

## 2021-10-14 VITALS — BP 158/69 | HR 64 | Temp 98.2°F | Resp 18

## 2021-10-14 DIAGNOSIS — D649 Anemia, unspecified: Secondary | ICD-10-CM

## 2021-10-14 DIAGNOSIS — N189 Chronic kidney disease, unspecified: Secondary | ICD-10-CM | POA: Diagnosis not present

## 2021-10-14 DIAGNOSIS — E611 Iron deficiency: Secondary | ICD-10-CM | POA: Diagnosis not present

## 2021-10-14 DIAGNOSIS — N183 Chronic kidney disease, stage 3 unspecified: Secondary | ICD-10-CM

## 2021-10-14 DIAGNOSIS — N2889 Other specified disorders of kidney and ureter: Secondary | ICD-10-CM

## 2021-10-14 DIAGNOSIS — D631 Anemia in chronic kidney disease: Secondary | ICD-10-CM | POA: Diagnosis not present

## 2021-10-14 DIAGNOSIS — N1831 Chronic kidney disease, stage 3a: Secondary | ICD-10-CM | POA: Diagnosis not present

## 2021-10-14 MED ORDER — LORATADINE 10 MG PO TABS
10.0000 mg | ORAL_TABLET | Freq: Once | ORAL | Status: AC
  Administered 2021-10-14: 10 mg via ORAL
  Filled 2021-10-14: qty 1

## 2021-10-14 MED ORDER — SODIUM CHLORIDE 0.9 % IV SOLN
200.0000 mg | Freq: Once | INTRAVENOUS | Status: DC
  Filled 2021-10-14: qty 10

## 2021-10-14 MED ORDER — SODIUM CHLORIDE 0.9 % IV SOLN
200.0000 mg | Freq: Once | INTRAVENOUS | Status: AC
  Administered 2021-10-14: 200 mg via INTRAVENOUS
  Filled 2021-10-14: qty 200

## 2021-10-14 MED ORDER — ACETAMINOPHEN 325 MG PO TABS
650.0000 mg | ORAL_TABLET | Freq: Once | ORAL | Status: AC
  Administered 2021-10-14: 650 mg via ORAL
  Filled 2021-10-14: qty 2

## 2021-10-14 MED ORDER — SODIUM CHLORIDE 0.9 % IV SOLN: Freq: Once | INTRAVENOUS | Status: AC

## 2021-10-14 NOTE — Patient Instructions (Signed)
[facility]  Discharge Instructions: Thank you for choosing [facility] to provide your oncology and hematology care.  If you have a lab appointment with the Cancer Center, please come in thru the Main Entrance and check in at the main information desk.  Wear comfortable clothing and clothing appropriate for easy access to any Portacath or PICC line.   We strive to give you quality time with your provider. You may need to reschedule your appointment if you arrive late (15 or more minutes).  Arriving late affects you and other patients whose appointments are after yours.  Also, if you miss three or more appointments without notifying the office, you may be dismissed from the clinic at the provider's discretion.      For prescription refill requests, have your pharmacy contact our office and allow 72 hours for refills to be completed.    Today you received Venofer IV iron infusion.     BELOW ARE SYMPTOMS THAT SHOULD BE REPORTED IMMEDIATELY: *FEVER GREATER THAN 100.4 F (38 C) OR HIGHER *CHILLS OR SWEATING *NAUSEA AND VOMITING THAT IS NOT CONTROLLED WITH YOUR NAUSEA MEDICATION *UNUSUAL SHORTNESS OF BREATH *UNUSUAL BRUISING OR BLEEDING *URINARY PROBLEMS (pain or burning when urinating, or frequent urination) *BOWEL PROBLEMS (unusual diarrhea, constipation, pain near the anus) TENDERNESS IN MOUTH AND THROAT WITH OR WITHOUT PRESENCE OF ULCERS (sore throat, sores in mouth, or a toothache) UNUSUAL RASH, SWELLING OR PAIN  UNUSUAL VAGINAL DISCHARGE OR ITCHING   Items with * indicate a potential emergency and should be followed up as soon as possible or go to the Emergency Department if any problems should occur.  Please show the CHEMOTHERAPY ALERT CARD or IMMUNOTHERAPY ALERT CARD at check-in to the Emergency Department and triage nurse.  Should you have questions after your visit or need to cancel or reschedule your appointment, please contact [facility]  [phone]  and follow the prompts.  Office hours are 8:00 a.m. to 4:30 p.m. Monday - Friday. Please note that voicemails left after 4:00 p.m. may not be returned until the following business day.  We are closed weekends and major holidays. You have access to a nurse at all times for urgent questions. Please call the main number to the clinic [phone] and follow the prompts.  For any non-urgent questions, you may also contact your provider using MyChart. We now offer e-Visits for anyone 18 and older to request care online for non-urgent symptoms. For details visit [email or web address].   Also download the MyChart app! Go to the app store, search "MyChart", open the app, select [place], and log in with your MyChart username and password.  Due to Covid, a mask is required upon entering the hospital/clinic. If you do not have a mask, one will be given to you upon arrival. For doctor visits, patients may have 1 support person aged 18 or older with them. For treatment visits, patients cannot have anyone with them due to current Covid guidelines and our immunocompromised population.  

## 2021-10-14 NOTE — Progress Notes (Signed)
Pt presents today for Venofer IV iron infusion per provider's order. Vital signs stable and pt voiced no new complaints at this time.  Peripheral IV started with good blood return pre and post infusion.  Venofer given today per MD orders. Tolerated infusion without adverse affects. Vital signs stable. No complaints at this time. Discharged from clinic ambulatory in stable condition. Alert and oriented x 3. F/U with [facility] as scheduled.    

## 2021-10-15 ENCOUNTER — Inpatient Hospital Stay (HOSPITAL_COMMUNITY): Payer: Medicare Other

## 2021-10-15 ENCOUNTER — Inpatient Hospital Stay (HOSPITAL_COMMUNITY): Payer: Medicare Other | Attending: Physician Assistant

## 2021-10-15 ENCOUNTER — Ambulatory Visit (HOSPITAL_COMMUNITY): Payer: Medicare Other

## 2021-10-15 VITALS — BP 130/65 | HR 66 | Temp 98.6°F | Resp 18

## 2021-10-15 DIAGNOSIS — N189 Chronic kidney disease, unspecified: Secondary | ICD-10-CM | POA: Insufficient documentation

## 2021-10-15 DIAGNOSIS — E875 Hyperkalemia: Secondary | ICD-10-CM | POA: Diagnosis not present

## 2021-10-15 DIAGNOSIS — D509 Iron deficiency anemia, unspecified: Secondary | ICD-10-CM | POA: Diagnosis not present

## 2021-10-15 DIAGNOSIS — N1831 Chronic kidney disease, stage 3a: Secondary | ICD-10-CM | POA: Diagnosis not present

## 2021-10-15 DIAGNOSIS — Z87891 Personal history of nicotine dependence: Secondary | ICD-10-CM | POA: Diagnosis not present

## 2021-10-15 DIAGNOSIS — E538 Deficiency of other specified B group vitamins: Secondary | ICD-10-CM | POA: Diagnosis not present

## 2021-10-15 DIAGNOSIS — D500 Iron deficiency anemia secondary to blood loss (chronic): Secondary | ICD-10-CM

## 2021-10-15 DIAGNOSIS — D649 Anemia, unspecified: Secondary | ICD-10-CM

## 2021-10-15 DIAGNOSIS — K5900 Constipation, unspecified: Secondary | ICD-10-CM | POA: Insufficient documentation

## 2021-10-15 DIAGNOSIS — D696 Thrombocytopenia, unspecified: Secondary | ICD-10-CM | POA: Diagnosis not present

## 2021-10-15 DIAGNOSIS — I129 Hypertensive chronic kidney disease with stage 1 through stage 4 chronic kidney disease, or unspecified chronic kidney disease: Secondary | ICD-10-CM | POA: Diagnosis not present

## 2021-10-15 DIAGNOSIS — D631 Anemia in chronic kidney disease: Secondary | ICD-10-CM | POA: Diagnosis not present

## 2021-10-15 DIAGNOSIS — N183 Chronic kidney disease, stage 3 unspecified: Secondary | ICD-10-CM

## 2021-10-15 LAB — CBC
HCT: 26 % — ABNORMAL LOW (ref 39.0–52.0)
Hemoglobin: 8.1 g/dL — ABNORMAL LOW (ref 13.0–17.0)
MCH: 29.5 pg (ref 26.0–34.0)
MCHC: 31.2 g/dL (ref 30.0–36.0)
MCV: 94.5 fL (ref 80.0–100.0)
Platelets: 273 10*3/uL (ref 150–400)
RBC: 2.75 MIL/uL — ABNORMAL LOW (ref 4.22–5.81)
RDW: 19 % — ABNORMAL HIGH (ref 11.5–15.5)
WBC: 3.7 10*3/uL — ABNORMAL LOW (ref 4.0–10.5)
nRBC: 0 % (ref 0.0–0.2)

## 2021-10-15 MED ORDER — EPOETIN ALFA-EPBX 40000 UNIT/ML IJ SOLN
40000.0000 [IU] | Freq: Once | INTRAMUSCULAR | Status: AC
  Administered 2021-10-15: 40000 [IU] via SUBCUTANEOUS
  Filled 2021-10-15: qty 1

## 2021-10-15 NOTE — Patient Instructions (Signed)
[facility]  Discharge Instructions: Thank you for choosing [facility] to provide your oncology and hematology care.  If you have a lab appointment with the Cancer Center, please come in thru the Main Entrance and check in at the main information desk.  Wear comfortable clothing and clothing appropriate for easy access to any Portacath or PICC line.   We strive to give you quality time with your provider. You may need to reschedule your appointment if you arrive late (15 or more minutes).  Arriving late affects you and other patients whose appointments are after yours.  Also, if you miss three or more appointments without notifying the office, you may be dismissed from the clinic at the provider's discretion.      For prescription refill requests, have your pharmacy contact our office and allow 72 hours for refills to be completed.    Today you received the following chemotherapy and/or immunotherapy agents Retacrit      To help prevent nausea and vomiting after your treatment, we encourage you to take your nausea medication as directed.  BELOW ARE SYMPTOMS THAT SHOULD BE REPORTED IMMEDIATELY: *FEVER GREATER THAN 100.4 F (38 C) OR HIGHER *CHILLS OR SWEATING *NAUSEA AND VOMITING THAT IS NOT CONTROLLED WITH YOUR NAUSEA MEDICATION *UNUSUAL SHORTNESS OF BREATH *UNUSUAL BRUISING OR BLEEDING *URINARY PROBLEMS (pain or burning when urinating, or frequent urination) *BOWEL PROBLEMS (unusual diarrhea, constipation, pain near the anus) TENDERNESS IN MOUTH AND THROAT WITH OR WITHOUT PRESENCE OF ULCERS (sore throat, sores in mouth, or a toothache) UNUSUAL RASH, SWELLING OR PAIN  UNUSUAL VAGINAL DISCHARGE OR ITCHING   Items with * indicate a potential emergency and should be followed up as soon as possible or go to the Emergency Department if any problems should occur.  Please show the CHEMOTHERAPY ALERT CARD or IMMUNOTHERAPY ALERT CARD at check-in to the Emergency  Department and triage nurse.  Should you have questions after your visit or need to cancel or reschedule your appointment, please contact [facility] [phone]  and follow the prompts.  Office hours are 8:00 a.m. to 4:30 p.m. Monday - Friday. Please note that voicemails left after 4:00 p.m. may not be returned until the following business day.  We are closed weekends and major holidays. You have access to a nurse at all times for urgent questions. Please call the main number to the clinic [phone] and follow the prompts.  For any non-urgent questions, you may also contact your provider using MyChart. We now offer e-Visits for anyone 18 and older to request care online for non-urgent symptoms. For details visit [email or web address].   Also download the MyChart app! Go to the app store, search "MyChart", open the app, select [place], and log in with your MyChart username and password.  Due to Covid, a mask is required upon entering the hospital/clinic. If you do not have a mask, one will be given to you upon arrival. For doctor visits, patients may have 1 support person aged 18 or older with them. For treatment visits, patients cannot have anyone with them due to current Covid guidelines and our immunocompromised population.  

## 2021-10-15 NOTE — Progress Notes (Signed)
Roger Hansen presents today for Retacrit injection per the provider's orders.  Stable during administration without incident; injection site WNL; see MAR for injection details.  Patient tolerated procedure well and without incident.  No questions or complaints noted at this time. Discharge from clinic ambulatory in stable condition.  Alert and oriented X 3.  Follow up with [facility] as scheduled.

## 2021-10-16 ENCOUNTER — Ambulatory Visit (HOSPITAL_COMMUNITY): Payer: Medicare Other

## 2021-10-16 ENCOUNTER — Other Ambulatory Visit (HOSPITAL_COMMUNITY): Payer: Medicare Other

## 2021-10-16 LAB — METHYLMALONIC ACID, SERUM: Methylmalonic Acid, Quantitative: 470 nmol/L — ABNORMAL HIGH (ref 0–378)

## 2021-10-17 ENCOUNTER — Other Ambulatory Visit: Payer: Self-pay | Admitting: *Deleted

## 2021-10-17 ENCOUNTER — Other Ambulatory Visit: Payer: Self-pay

## 2021-10-17 ENCOUNTER — Encounter: Payer: Self-pay | Admitting: Internal Medicine

## 2021-10-17 ENCOUNTER — Ambulatory Visit (INDEPENDENT_AMBULATORY_CARE_PROVIDER_SITE_OTHER): Payer: Medicare Other | Admitting: Internal Medicine

## 2021-10-17 VITALS — BP 158/98 | HR 76 | Ht 65.0 in | Wt 184.6 lb

## 2021-10-17 DIAGNOSIS — F1010 Alcohol abuse, uncomplicated: Secondary | ICD-10-CM | POA: Diagnosis not present

## 2021-10-17 DIAGNOSIS — I10 Essential (primary) hypertension: Secondary | ICD-10-CM | POA: Diagnosis not present

## 2021-10-17 DIAGNOSIS — N183 Chronic kidney disease, stage 3 unspecified: Secondary | ICD-10-CM

## 2021-10-17 DIAGNOSIS — I509 Heart failure, unspecified: Secondary | ICD-10-CM | POA: Diagnosis not present

## 2021-10-17 MED ORDER — METOPROLOL SUCCINATE ER 25 MG PO TB24: 25.0000 mg | ORAL_TABLET | Freq: Every day | ORAL | 3 refills | Status: DC

## 2021-10-17 NOTE — Progress Notes (Signed)
Cardiology Office Note:    Date:  10/17/2021   ID:  Roger Hansen, DOB 01-25-1946, MRN [id number]  PCP:  Curlene Labrum, MD   [facility] Providers Cardiologist:  Werner Lean, MD     Referring MD: Curlene Labrum, MD   CC: transition to new cardiologist  History of Present Illness:    Roger Hansen is a 75 y.o. male with a hx of HFmrEF, CKD stage IIIa, HTN, Dementia on Donepezil who presents for evaluation 10/17/21.  Patient notes that he is doing great.    Is getting worked up for anemia and there is a concern about an alcohol component.    Patient gets sternal chest pain from time to time when he gets overexcited and wit a panic attack.  Panic attacks occur all the time- if he is getting over fluster they then progress to chest pain.  Has chest pain that also occurs with eating.  Has no exertional chest pain:  he can push mow his own lawn without symptoms.  He helped his grandchildren move without symptoms.   No SOB/DOE and no PND/Orthopnea.  No weight gain or leg swelling.  No palpitations or syncope.  Is not going to go out and run a marathon but feels good overall.  Has not taken his blood pressure today.  Notes that when his blood count gets low he does feel dizziness and tachycardia; this improved when this is corrected.  Ambulatory blood pressure not done.   Past Medical History:  Diagnosis Date   Alcohol abuse 11/24/2016   Anemia 10/11/2013   Cardiomyopathy ([place])    a. EF 50% by echo in 2019 with NST showing no reversible ischemia and thought to be alcohol-induced   Chronic renal disease, stage 3, moderately decreased glomerular filtration rate (GFR) between 30-59 mL/min/1.73 square meter (HCC) 12/02/2016   Colon polyps    GERD (gastroesophageal reflux disease)    High cholesterol    Hypertension    Sinus infection     Past Surgical History:  Procedure Laterality Date   BIOPSY  06/03/2019   Procedure: BIOPSY;  Surgeon: Rogene Houston, MD;   [facility];  Service: Endoscopy;;  esophagus   COLONOSCOPY N/A 10/26/2013   Procedure: COLONOSCOPY;  Surgeon: Rogene Houston, MD;  [facility];  Service: Endoscopy;  Laterality: N/A;  325-moved to 1230 Ann to notify pt   Colonoscopy with polypectomy     COLONOSCOPY WITH PROPOFOL N/A 06/03/2019   Procedure: COLONOSCOPY WITH PROPOFOL;  Surgeon: Rogene Houston, MD;  [facility];  Service: Endoscopy;  Laterality: N/A;   ESOPHAGOGASTRODUODENOSCOPY (EGD) WITH PROPOFOL N/A 06/03/2019   Procedure: ESOPHAGOGASTRODUODENOSCOPY (EGD) WITH PROPOFOL;  Surgeon: Rogene Houston, MD;  [facility];  Service: Endoscopy;  Laterality: N/A;   POLYPECTOMY  06/03/2019   Procedure: POLYPECTOMY;  Surgeon: Rogene Houston, MD;  [facility];  Service: Endoscopy;;  colon    Current Medications: Current Meds  Medication Sig   ALPRAZolam (XANAX) 1 MG tablet    aspirin 81 MG tablet Take 1 tablet (81 mg total) by mouth daily.   atorvastatin (LIPITOR) 10 MG tablet Take 10 mg by mouth daily.   buPROPion (WELLBUTRIN SR) 150 MG 12 hr tablet Take 150 mg by mouth daily.    chlorhexidine (PERIDEX) 0.12 % solution    cholecalciferol (VITAMIN D) 1000 UNITS tablet Take 1,000 Units by mouth 2 (two) times daily.   donepezil (ARICEPT)  10 MG tablet    Eszopiclone 3 MG TABS Take 3 mg by mouth at bedtime.    Ferrous Sulfate (IRON) 325 (65 Fe) MG TABS TAKE ONE TABLET BY MOUTH DAILY WITH BREAKFAST. (Patient taking differently: 1 tablet 2 (two) times a day.)   folic acid (FOLVITE) 1 MG tablet Take 1 mg by mouth daily.   furosemide (LASIX) 20 MG tablet Take 20 mg by mouth daily.    gabapentin (NEURONTIN) 300 MG capsule Take 300 mg by mouth 3 (three) times daily as needed.   GNP VITAMIN B-1 100 MG tablet Take 100 mg by mouth daily.    losartan (COZAAR) 50 MG tablet Take 50 mg by mouth daily.   metoprolol succinate (TOPROL XL) 25 MG 24 hr tablet Take 1 tablet (25 mg total) by  mouth daily.   nitroGLYCERIN (NITROSTAT) 0.4 MG SL tablet Place 1 tablet (0.4 mg total) under the tongue every 5 (five) minutes as needed for chest pain.   omeprazole (PRILOSEC) 20 MG capsule Take 20 mg by mouth daily as needed (Acid Reflux).   potassium chloride SA (K-DUR) 20 MEQ tablet Take 20 mEq by mouth daily.    QUEtiapine (SEROQUEL) 100 MG tablet    tamsulosin (FLOMAX) 0.4 MG CAPS capsule    traZODone (DESYREL) 100 MG tablet Take 100 mg by mouth at bedtime.   vitamin B-12 (CYANOCOBALAMIN) 1000 MCG tablet Take 1,000 mcg by mouth daily.   ziprasidone (GEODON) 40 MG capsule Take 40 mg by mouth 2 (two) times daily with a meal.      Allergies:   Horseradish [armoracia rusticana ext (horseradish)]   Social History   Socioeconomic History   Marital status: Married    Spouse name: Not on file   Number of children: Not on file   Years of education: Not on file   Highest education level: Not on file  Occupational History   Not on file  Tobacco Use   Smoking status: Former    Packs/day: 0.50    Years: 20.00    Pack years: 10.00    Types: Cigarettes    Quit date: 12/27/1971    Years since quitting: 49.8   Smokeless tobacco: Never  Vaping Use   Vaping Use: Never used  Substance and Sexual Activity   Alcohol use: Yes    Comment: 1 pint of etoh a day ( not recently)   Drug use: No   Sexual activity: Not on file  Other Topics Concern   Not on file  Social History Narrative   Not on file   Social Determinants of Health   Financial Resource Strain: Not on file  Food Insecurity: No Food Insecurity   Worried About Running Out of Food in the Last Year: Never true   Surprise in the Last Year: Never true  Transportation Needs: No Transportation Needs   Lack of Transportation (Medical): No   Lack of Transportation (Non-Medical): No  Physical Activity: Inactive   Days of Exercise per Week: 0 days   Minutes of Exercise per Session: 0 min  Stress: No Stress Concern Present    Feeling of Stress : Not at all  Social Connections: Not on file    Social: [place] Veteran, Wife is starting to have dementia  Family History: The patient's family history includes Hypertension in his mother. There is no history of Colon cancer.  ROS:   Please see the history of present illness.     All other systems reviewed and  are negative.  EKGs/Labs/Other Studies Reviewed:    The following studies were reviewed today:  EKG:  EKG is  ordered today.  The ekg ordered today demonstrates  10/17/21: SR with sinus arrhythmia rate 73 WNL 07/26/20: SR 84 WNL  Transthoracic Echocardiogram: Date:09/23/2018 Results: - Left ventricle: The cavity size was normal. Wall thickness was    increased increased in a pattern of mild to moderate LVH. The    estimated ejection fraction was 50%. Probable hypokinesis of the    basalinferolateral and inferior myocardium. Indeterminate    diastolic function.  - Aortic valve: Mildly calcified annulus. Trileaflet. There was    mild regurgitation.  - Mitral valve: There was mild regurgitation.  - Right atrium: Central venous pressure (est): 3 mm Hg.  - Atrial septum: No defect or patent foramen ovale was identified.  - Tricuspid valve: There was trivial regurgitation.  - Pulmonary arteries: Systolic pressure could not be accurately    estimated.  - Pericardium, extracardiac: There was no pericardial effusion.   Recent Labs: 09/20/2021: ALT 24; BUN 12; Creatinine, Ser 1.75; Potassium 3.3; Sodium 136 10/15/2021: Hemoglobin 8.1; Platelets 273  Recent Lipid Panel No results found for: CHOL, TRIG, HDL, CHOLHDL, VLDL, LDLCALC, LDLDIRECT   Physical Exam:    VS:  BP (!) 158/98   Pulse 76   Ht 5\' 5"  (1.651 m)   Wt 184 lb 9.6 oz (83.7 kg)   SpO2 96%   BMI 30.72 kg/m     Wt Readings from Last 3 Encounters:  10/17/21 184 lb 9.6 oz (83.7 kg)  10/07/21 183 lb 10.3 oz (83.3 kg)  09/03/21 180 lb 1.6 oz (81.7 kg)     GEN:  Well nourished, well  developed in no acute distress HEENT: Normal NECK: No JVD; No carotid bruits LYMPHATICS: No lymphadenopathy CARDIAC: RRR, no rubs, gallops; slight systolic murmur RESPIRATORY:  Clear to auscultation without rales, wheezing or rhonchi  ABDOMEN: Soft, non-tender, non-distended MUSCULOSKELETAL:  No edema; No deformity  SKIN: Warm and dry NEUROLOGIC:  Alert and oriented x 3 PSYCHIATRIC:  Normal affect   ASSESSMENT:    1. Heart failure, type unknown ([place])   2. Essential hypertension, benign   3. Chronic renal insufficiency, stage 3 (moderate) (HCC)   4. Alcohol abuse    PLAN:     Heart Failure mildly reduced Ejection Fraction  CKD STAGE IIIA Alcohol dependence MCD on donepazeil - will start with repeat echocardiogram, low threshold for ischemic work up based on sx and results - Continue losartan 50, lasix 20 mg PO daily, K; patient deferred BMP today PCP is managing fluid pill; BMP on Monday with other labs - has never used his PRN nitroglycerin; if needs this will get imdur and ischemia work up - ASA and atorvastatin are reasonable, LDL 72 without known CAD; Aortic atherosclerosis is listed in chart review - will start succinate 25 mg PO daily  6 month follow up unless echo findings   Medication Adjustments/Labs and Tests Ordered: Current medicines are reviewed at length with the patient today.  Concerns regarding medicines are outlined above.  Orders Placed This Encounter  Procedures   Basic metabolic panel   EKG [id number]   ECHOCARDIOGRAM COMPLETE   Meds ordered this encounter  Medications   metoprolol succinate (TOPROL XL) 25 MG 24 hr tablet    Sig: Take 1 tablet (25 mg total) by mouth daily.    Dispense:  90 tablet    Refill:  3     Patient Instructions  Medication Instructions:  Your physician has recommended you make the following change in your medication:   START Metoprolol Succinate 25 mg taking 1 tablet daily    *If you need a refill on your cardiac  medications before your next appointment, please call your pharmacy*   Lab Work: On Monday when you have labs at the [place]:  BMET  If you have labs (blood work) drawn today and your tests are completely normal, you will receive your results only by: [place] (if you have MyChart) OR A paper copy in the mail If you have any lab test that is abnormal or we need to change your treatment, we will call you to review the results.   Testing/Procedures: Your physician has requested that you have an echocardiogram. Echocardiography is a painless test that uses sound waves to create images of your heart. It provides your doctor with information about the size and shape of your heart and how well your heart's chambers and valves are working. This procedure takes approximately one hour. There are no restrictions for this procedure.    Follow-Up: At [facility], you and your health needs are our priority.  As part of our continuing mission to provide you with exceptional heart care, we have created designated Provider Care Teams.  These Care Teams include your primary Cardiologist (physician) and Advanced Practice Providers (APPs -  Physician Assistants and Nurse Practitioners) who all work together to provide you with the care you need, when you need it.  We recommend signing up for the patient portal called "MyChart".  Sign up information is provided on this After Visit Summary.  MyChart is used to connect with patients for Virtual Visits (Telemedicine).  Patients are able to view lab/test results, encounter notes, upcoming appointments, etc.  Non-urgent messages can be sent to your provider as well.   To learn more about what you can do with MyChart, go to [email or web address].    Your next appointment:   6 month(s)  The format for your next appointment:   In Person  Provider:   You may see Dr. Gasper Sells or one of the following Advanced Practice Providers on your  designated Care Team:   Bernerd Pho, PA-C  Ermalinda Barrios, PA-C    Other Instructions    Signed, Werner Lean, MD  10/17/2021 10:31 AM    [place]

## 2021-10-17 NOTE — Patient Instructions (Addendum)
Medication Instructions:  Your physician has recommended you make the following change in your medication:   START Metoprolol Succinate 25 mg taking 1 tablet daily    *If you need a refill on your cardiac medications before your next appointment, please call your pharmacy*   Lab Work: On Monday when you have labs at the [place]:  BMET  If you have labs (blood work) drawn today and your tests are completely normal, you will receive your results only by: [place] (if you have MyChart) OR A paper copy in the mail If you have any lab test that is abnormal or we need to change your treatment, we will call you to review the results.   Testing/Procedures: Your physician has requested that you have an echocardiogram. Echocardiography is a painless test that uses sound waves to create images of your heart. It provides your doctor with information about the size and shape of your heart and how well your heart's chambers and valves are working. This procedure takes approximately one hour. There are no restrictions for this procedure.    Follow-Up: At [facility], you and your health needs are our priority.  As part of our continuing mission to provide you with exceptional heart care, we have created designated Provider Care Teams.  These Care Teams include your primary Cardiologist (physician) and Advanced Practice Providers (APPs -  Physician Assistants and Nurse Practitioners) who all work together to provide you with the care you need, when you need it.  We recommend signing up for the patient portal called "MyChart".  Sign up information is provided on this After Visit Summary.  MyChart is used to connect with patients for Virtual Visits (Telemedicine).  Patients are able to view lab/test results, encounter notes, upcoming appointments, etc.  Non-urgent messages can be sent to your provider as well.   To learn more about what you can do with MyChart, go to [email or web address].     Your next appointment:   6 month(s)  The format for your next appointment:   In Person  Provider:   You may see Dr. Gasper Sells or one of the following Advanced Practice Providers on your designated Care Team:   Bernerd Pho, PA-C  Ermalinda Barrios, [place]    Other Instructions

## 2021-10-21 ENCOUNTER — Other Ambulatory Visit (HOSPITAL_COMMUNITY): Payer: Medicare Other

## 2021-10-21 ENCOUNTER — Inpatient Hospital Stay (HOSPITAL_COMMUNITY): Payer: Medicare Other

## 2021-10-21 ENCOUNTER — Ambulatory Visit (HOSPITAL_COMMUNITY): Payer: Medicare Other

## 2021-10-21 ENCOUNTER — Other Ambulatory Visit: Payer: Self-pay

## 2021-10-21 VITALS — BP 132/68 | HR 65 | Temp 98.3°F | Resp 18

## 2021-10-21 DIAGNOSIS — D631 Anemia in chronic kidney disease: Secondary | ICD-10-CM | POA: Diagnosis not present

## 2021-10-21 DIAGNOSIS — D696 Thrombocytopenia, unspecified: Secondary | ICD-10-CM | POA: Diagnosis not present

## 2021-10-21 DIAGNOSIS — N183 Chronic kidney disease, stage 3 unspecified: Secondary | ICD-10-CM

## 2021-10-21 DIAGNOSIS — N1831 Chronic kidney disease, stage 3a: Secondary | ICD-10-CM | POA: Diagnosis not present

## 2021-10-21 DIAGNOSIS — E875 Hyperkalemia: Secondary | ICD-10-CM | POA: Diagnosis not present

## 2021-10-21 DIAGNOSIS — I129 Hypertensive chronic kidney disease with stage 1 through stage 4 chronic kidney disease, or unspecified chronic kidney disease: Secondary | ICD-10-CM | POA: Diagnosis not present

## 2021-10-21 DIAGNOSIS — D649 Anemia, unspecified: Secondary | ICD-10-CM

## 2021-10-21 DIAGNOSIS — N2889 Other specified disorders of kidney and ureter: Secondary | ICD-10-CM

## 2021-10-21 DIAGNOSIS — D509 Iron deficiency anemia, unspecified: Secondary | ICD-10-CM | POA: Diagnosis not present

## 2021-10-21 DIAGNOSIS — D500 Iron deficiency anemia secondary to blood loss (chronic): Secondary | ICD-10-CM

## 2021-10-21 LAB — CBC
HCT: 32.8 % — ABNORMAL LOW (ref 39.0–52.0)
Hemoglobin: 9.9 g/dL — ABNORMAL LOW (ref 13.0–17.0)
MCH: 28.5 pg (ref 26.0–34.0)
MCHC: 30.2 g/dL (ref 30.0–36.0)
MCV: 94.5 fL (ref 80.0–100.0)
Platelets: 327 10*3/uL (ref 150–400)
RBC: 3.47 MIL/uL — ABNORMAL LOW (ref 4.22–5.81)
RDW: 18.7 % — ABNORMAL HIGH (ref 11.5–15.5)
WBC: 4 10*3/uL (ref 4.0–10.5)
nRBC: 0 % (ref 0.0–0.2)

## 2021-10-21 MED ORDER — EPOETIN ALFA-EPBX 40000 UNIT/ML IJ SOLN
40000.0000 [IU] | Freq: Once | INTRAMUSCULAR | Status: AC
  Administered 2021-10-21: 40000 [IU] via SUBCUTANEOUS
  Filled 2021-10-21: qty 1

## 2021-10-21 NOTE — Progress Notes (Signed)
Roger Hansen presents today for Retacrit injection per the provider's orders.  Stable during administration without incident; injection site WNL; see MAR for injection details.  Patient tolerated procedure well and without incident.  No questions or complaints noted at this time.  Discharge from clinic ambulatory in stable condition.  Alert and oriented X 3.  Follow up with [facility] as scheduled.

## 2021-10-21 NOTE — Patient Instructions (Signed)
[facility]  Discharge Instructions: Thank you for choosing [facility] to provide your oncology and hematology care.  If you have a lab appointment with the Cancer Center, please come in thru the Main Entrance and check in at the main information desk.  Wear comfortable clothing and clothing appropriate for easy access to any Portacath or PICC line.   We strive to give you quality time with your provider. You may need to reschedule your appointment if you arrive late (15 or more minutes).  Arriving late affects you and other patients whose appointments are after yours.  Also, if you miss three or more appointments without notifying the office, you may be dismissed from the clinic at the provider's discretion.      For prescription refill requests, have your pharmacy contact our office and allow 72 hours for refills to be completed.    Today you received the following chemotherapy and/or immunotherapy agents Retacrit      To help prevent nausea and vomiting after your treatment, we encourage you to take your nausea medication as directed.  BELOW ARE SYMPTOMS THAT SHOULD BE REPORTED IMMEDIATELY: *FEVER GREATER THAN 100.4 F (38 C) OR HIGHER *CHILLS OR SWEATING *NAUSEA AND VOMITING THAT IS NOT CONTROLLED WITH YOUR NAUSEA MEDICATION *UNUSUAL SHORTNESS OF BREATH *UNUSUAL BRUISING OR BLEEDING *URINARY PROBLEMS (pain or burning when urinating, or frequent urination) *BOWEL PROBLEMS (unusual diarrhea, constipation, pain near the anus) TENDERNESS IN MOUTH AND THROAT WITH OR WITHOUT PRESENCE OF ULCERS (sore throat, sores in mouth, or a toothache) UNUSUAL RASH, SWELLING OR PAIN  UNUSUAL VAGINAL DISCHARGE OR ITCHING   Items with * indicate a potential emergency and should be followed up as soon as possible or go to the Emergency Department if any problems should occur.  Please show the CHEMOTHERAPY ALERT CARD or IMMUNOTHERAPY ALERT CARD at check-in to the Emergency  Department and triage nurse.  Should you have questions after your visit or need to cancel or reschedule your appointment, please contact [facility] [phone]  and follow the prompts.  Office hours are 8:00 a.m. to 4:30 p.m. Monday - Friday. Please note that voicemails left after 4:00 p.m. may not be returned until the following business day.  We are closed weekends and major holidays. You have access to a nurse at all times for urgent questions. Please call the main number to the clinic [phone] and follow the prompts.  For any non-urgent questions, you may also contact your provider using MyChart. We now offer e-Visits for anyone 18 and older to request care online for non-urgent symptoms. For details visit [email or web address].   Also download the MyChart app! Go to the app store, search "MyChart", open the app, select [place], and log in with your MyChart username and password.  Due to Covid, a mask is required upon entering the hospital/clinic. If you do not have a mask, one will be given to you upon arrival. For doctor visits, patients may have 1 support person aged 18 or older with them. For treatment visits, patients cannot have anyone with them due to current Covid guidelines and our immunocompromised population.  

## 2021-10-22 DIAGNOSIS — Z23 Encounter for immunization: Secondary | ICD-10-CM | POA: Diagnosis not present

## 2021-10-23 ENCOUNTER — Other Ambulatory Visit (HOSPITAL_COMMUNITY): Payer: Medicare Other

## 2021-10-23 ENCOUNTER — Ambulatory Visit (HOSPITAL_COMMUNITY): Payer: Medicare Other

## 2021-10-24 MED FILL — Iron Sucrose Inj 20 MG/ML (Fe Equiv): INTRAVENOUS | Qty: 10 | Status: AC

## 2021-10-25 ENCOUNTER — Other Ambulatory Visit: Payer: Self-pay

## 2021-10-25 ENCOUNTER — Encounter (HOSPITAL_COMMUNITY): Payer: Self-pay

## 2021-10-25 ENCOUNTER — Inpatient Hospital Stay (HOSPITAL_COMMUNITY): Payer: Medicare Other

## 2021-10-25 VITALS — BP 132/82 | HR 57 | Temp 97.9°F | Resp 18

## 2021-10-25 DIAGNOSIS — E875 Hyperkalemia: Secondary | ICD-10-CM | POA: Diagnosis not present

## 2021-10-25 DIAGNOSIS — N183 Chronic kidney disease, stage 3 unspecified: Secondary | ICD-10-CM

## 2021-10-25 DIAGNOSIS — N1831 Chronic kidney disease, stage 3a: Secondary | ICD-10-CM | POA: Diagnosis not present

## 2021-10-25 DIAGNOSIS — D631 Anemia in chronic kidney disease: Secondary | ICD-10-CM | POA: Diagnosis not present

## 2021-10-25 DIAGNOSIS — D649 Anemia, unspecified: Secondary | ICD-10-CM

## 2021-10-25 DIAGNOSIS — D696 Thrombocytopenia, unspecified: Secondary | ICD-10-CM | POA: Diagnosis not present

## 2021-10-25 DIAGNOSIS — D509 Iron deficiency anemia, unspecified: Secondary | ICD-10-CM | POA: Diagnosis not present

## 2021-10-25 DIAGNOSIS — I129 Hypertensive chronic kidney disease with stage 1 through stage 4 chronic kidney disease, or unspecified chronic kidney disease: Secondary | ICD-10-CM | POA: Diagnosis not present

## 2021-10-25 MED ORDER — LORATADINE 10 MG PO TABS
10.0000 mg | ORAL_TABLET | Freq: Once | ORAL | Status: AC
  Administered 2021-10-25: 10 mg via ORAL
  Filled 2021-10-25: qty 1

## 2021-10-25 MED ORDER — ACETAMINOPHEN 325 MG PO TABS
650.0000 mg | ORAL_TABLET | Freq: Once | ORAL | Status: AC
  Administered 2021-10-25: 650 mg via ORAL
  Filled 2021-10-25: qty 2

## 2021-10-25 MED ORDER — SODIUM CHLORIDE 0.9 % IV SOLN: Freq: Once | INTRAVENOUS | Status: AC

## 2021-10-25 MED ORDER — SODIUM CHLORIDE 0.9 % IV SOLN
200.0000 mg | Freq: Once | INTRAVENOUS | Status: AC
  Administered 2021-10-25: 200 mg via INTRAVENOUS
  Filled 2021-10-25: qty 10

## 2021-10-25 NOTE — Progress Notes (Signed)
Patient presents today for Venofer 200 mg IV iron infusion. Vital signs are stable. Patient denies any changes since the last iron infusion. Patient denies any complaints today. MAR reviewed and updated.    Venofer 200 mg given today per MD orders. Tolerated infusion without adverse affects. Vital signs stable. No complaints at this time. Discharged from clinic ambulatory in stable condition. Alert and oriented x 3. F/U with [facility] as scheduled.

## 2021-10-25 NOTE — Patient Instructions (Signed)
[place]  Discharge Instructions: Thank you for choosing [place] to provide your oncology and hematology care.  If you have a lab appointment with the [facility] [facility], please come in thru the Main Entrance and check in at the main information desk.  Wear comfortable clothing and clothing appropriate for easy access to any Portacath or PICC line.   We strive to give you quality time with your provider. You may need to reschedule your appointment if you arrive late (15 or more minutes).  Arriving late affects you and other patients whose appointments are after yours.  Also, if you miss three or more appointments without notifying the office, you may be dismissed from the clinic at the provider's discretion.      For prescription refill requests, have your pharmacy contact our office and allow 72 hours for refills to be completed.    Today you received the following: Venofer 200 mg.       To help prevent nausea and vomiting after your treatment, we encourage you to take your nausea medication as directed.  BELOW ARE SYMPTOMS THAT SHOULD BE REPORTED IMMEDIATELY: *FEVER GREATER THAN 100.4 F (38 C) OR HIGHER *CHILLS OR SWEATING *NAUSEA AND VOMITING THAT IS NOT CONTROLLED WITH YOUR NAUSEA MEDICATION *UNUSUAL SHORTNESS OF BREATH *UNUSUAL BRUISING OR BLEEDING *URINARY PROBLEMS (pain or burning when urinating, or frequent urination) *BOWEL PROBLEMS (unusual diarrhea, constipation, pain near the anus) TENDERNESS IN MOUTH AND THROAT WITH OR WITHOUT PRESENCE OF ULCERS (sore throat, sores in mouth, or a toothache) UNUSUAL RASH, SWELLING OR PAIN  UNUSUAL VAGINAL DISCHARGE OR ITCHING   Items with * indicate a potential emergency and should be followed up as soon as possible or go to the Emergency Department if any problems should occur.  Please show the CHEMOTHERAPY ALERT CARD or IMMUNOTHERAPY ALERT CARD at check-in to the Emergency Department and triage nurse.  Should  you have questions after your visit or need to cancel or reschedule your appointment, please contact [facility] [phone]  and follow the prompts.  Office hours are 8:00 a.m. to 4:30 p.m. Monday - Friday. Please note that voicemails left after 4:00 p.m. may not be returned until the following business day.  We are closed weekends and major holidays. You have access to a nurse at all times for urgent questions. Please call the main number to the clinic [phone] and follow the prompts.  For any non-urgent questions, you may also contact your provider using MyChart. We now offer e-Visits for anyone 73 and older to request care online for non-urgent symptoms. For details visit [email or web address].   Also download the MyChart app! Go to the app store, search "MyChart", open the app, select [place], and log in with your MyChart username and password.  Due to Covid, a mask is required upon entering the hospital/clinic. If you do not have a mask, one will be given to you upon arrival. For doctor visits, patients may have 1 support person aged 73 or older or older with them. For treatment visits, patients cannot have anyone with them due to current Covid guidelines and our immunocompromised population.

## 2021-10-28 ENCOUNTER — Inpatient Hospital Stay (HOSPITAL_COMMUNITY): Payer: Medicare Other

## 2021-10-28 ENCOUNTER — Other Ambulatory Visit: Payer: Self-pay

## 2021-10-28 ENCOUNTER — Ambulatory Visit (HOSPITAL_COMMUNITY): Payer: Medicare Other

## 2021-10-28 ENCOUNTER — Other Ambulatory Visit (HOSPITAL_COMMUNITY): Payer: Medicare Other

## 2021-10-28 VITALS — BP 132/55 | HR 68 | Temp 97.5°F | Resp 18

## 2021-10-28 DIAGNOSIS — D649 Anemia, unspecified: Secondary | ICD-10-CM

## 2021-10-28 DIAGNOSIS — D500 Iron deficiency anemia secondary to blood loss (chronic): Secondary | ICD-10-CM

## 2021-10-28 DIAGNOSIS — D631 Anemia in chronic kidney disease: Secondary | ICD-10-CM | POA: Diagnosis not present

## 2021-10-28 DIAGNOSIS — I129 Hypertensive chronic kidney disease with stage 1 through stage 4 chronic kidney disease, or unspecified chronic kidney disease: Secondary | ICD-10-CM | POA: Diagnosis not present

## 2021-10-28 DIAGNOSIS — D509 Iron deficiency anemia, unspecified: Secondary | ICD-10-CM | POA: Diagnosis not present

## 2021-10-28 DIAGNOSIS — N2889 Other specified disorders of kidney and ureter: Secondary | ICD-10-CM

## 2021-10-28 DIAGNOSIS — D696 Thrombocytopenia, unspecified: Secondary | ICD-10-CM

## 2021-10-28 DIAGNOSIS — N183 Chronic kidney disease, stage 3 unspecified: Secondary | ICD-10-CM

## 2021-10-28 DIAGNOSIS — E875 Hyperkalemia: Secondary | ICD-10-CM | POA: Diagnosis not present

## 2021-10-28 DIAGNOSIS — N1831 Chronic kidney disease, stage 3a: Secondary | ICD-10-CM | POA: Diagnosis not present

## 2021-10-28 LAB — CBC
HCT: 33.7 % — ABNORMAL LOW (ref 39.0–52.0)
Hemoglobin: 10.4 g/dL — ABNORMAL LOW (ref 13.0–17.0)
MCH: 28.5 pg (ref 26.0–34.0)
MCHC: 30.9 g/dL (ref 30.0–36.0)
MCV: 92.3 fL (ref 80.0–100.0)
Platelets: 284 10*3/uL (ref 150–400)
RBC: 3.65 MIL/uL — ABNORMAL LOW (ref 4.22–5.81)
RDW: 18.5 % — ABNORMAL HIGH (ref 11.5–15.5)
WBC: 4.1 10*3/uL (ref 4.0–10.5)
nRBC: 0 % (ref 0.0–0.2)

## 2021-10-28 MED ORDER — EPOETIN ALFA-EPBX 40000 UNIT/ML IJ SOLN
40000.0000 [IU] | Freq: Once | INTRAMUSCULAR | Status: AC
  Administered 2021-10-28: 40000 [IU] via SUBCUTANEOUS
  Filled 2021-10-28: qty 1

## 2021-10-28 NOTE — Patient Instructions (Signed)
[facility]  Discharge Instructions: Thank you for choosing [facility] to provide your oncology and hematology care.  If you have a lab appointment with the Cancer Center, please come in thru the Main Entrance and check in at the main information desk.  Wear comfortable clothing and clothing appropriate for easy access to any Portacath or PICC line.   We strive to give you quality time with your provider. You may need to reschedule your appointment if you arrive late (15 or more minutes).  Arriving late affects you and other patients whose appointments are after yours.  Also, if you miss three or more appointments without notifying the office, you may be dismissed from the clinic at the provider's discretion.      For prescription refill requests, have your pharmacy contact our office and allow 72 hours for refills to be completed.        To help prevent nausea and vomiting after your treatment, we encourage you to take your nausea medication as directed.  BELOW ARE SYMPTOMS THAT SHOULD BE REPORTED IMMEDIATELY: *FEVER GREATER THAN 100.4 F (38 C) OR HIGHER *CHILLS OR SWEATING *NAUSEA AND VOMITING THAT IS NOT CONTROLLED WITH YOUR NAUSEA MEDICATION *UNUSUAL SHORTNESS OF BREATH *UNUSUAL BRUISING OR BLEEDING *URINARY PROBLEMS (pain or burning when urinating, or frequent urination) *BOWEL PROBLEMS (unusual diarrhea, constipation, pain near the anus) TENDERNESS IN MOUTH AND THROAT WITH OR WITHOUT PRESENCE OF ULCERS (sore throat, sores in mouth, or a toothache) UNUSUAL RASH, SWELLING OR PAIN  UNUSUAL VAGINAL DISCHARGE OR ITCHING   Items with * indicate a potential emergency and should be followed up as soon as possible or go to the Emergency Department if any problems should occur.  Please show the CHEMOTHERAPY ALERT CARD or IMMUNOTHERAPY ALERT CARD at check-in to the Emergency Department and triage nurse.  Should you have questions after your visit or need to cancel  or reschedule your appointment, please contact [facility] [phone]  and follow the prompts.  Office hours are 8:00 a.m. to 4:30 p.m. Monday - Friday. Please note that voicemails left after 4:00 p.m. may not be returned until the following business day.  We are closed weekends and major holidays. You have access to a nurse at all times for urgent questions. Please call the main number to the clinic [phone] and follow the prompts.  For any non-urgent questions, you may also contact your provider using MyChart. We now offer e-Visits for anyone 18 and older to request care online for non-urgent symptoms. For details visit [email or web address].   Also download the MyChart app! Go to the app store, search "MyChart", open the app, select [place], and log in with your MyChart username and password.  Due to Covid, a mask is required upon entering the hospital/clinic. If you do not have a mask, one will be given to you upon arrival. For doctor visits, patients may have 1 support person aged 18 or older with them. For treatment visits, patients cannot have anyone with them due to current Covid guidelines and our immunocompromised population.  

## 2021-10-28 NOTE — Progress Notes (Signed)
Patient tolerated Retacrit 40,000 unit injection with no complaints voiced.  Site clean and dry with no bruising or swelling noted.  No complaints of pain.  Discharged with vital signs stable and no signs or symptoms of distress noted.  

## 2021-10-29 ENCOUNTER — Ambulatory Visit (HOSPITAL_COMMUNITY): Payer: Medicare Other

## 2021-10-30 ENCOUNTER — Other Ambulatory Visit (HOSPITAL_COMMUNITY): Payer: Medicare Other

## 2021-10-30 ENCOUNTER — Ambulatory Visit (HOSPITAL_COMMUNITY): Payer: Medicare Other

## 2021-10-31 DIAGNOSIS — F332 Major depressive disorder, recurrent severe without psychotic features: Secondary | ICD-10-CM | POA: Diagnosis not present

## 2021-10-31 DIAGNOSIS — F4312 Post-traumatic stress disorder, chronic: Secondary | ICD-10-CM | POA: Diagnosis not present

## 2021-11-04 ENCOUNTER — Ambulatory Visit (HOSPITAL_COMMUNITY): Payer: Medicare Other

## 2021-11-04 ENCOUNTER — Other Ambulatory Visit (HOSPITAL_COMMUNITY): Payer: Medicare Other

## 2021-11-04 ENCOUNTER — Inpatient Hospital Stay (HOSPITAL_COMMUNITY): Payer: Medicare Other

## 2021-11-04 ENCOUNTER — Inpatient Hospital Stay (HOSPITAL_BASED_OUTPATIENT_CLINIC_OR_DEPARTMENT_OTHER): Payer: Medicare Other | Admitting: Physician Assistant

## 2021-11-04 ENCOUNTER — Ambulatory Visit (HOSPITAL_COMMUNITY): Payer: Medicare Other | Admitting: Physician Assistant

## 2021-11-04 ENCOUNTER — Other Ambulatory Visit: Payer: Self-pay

## 2021-11-04 VITALS — BP 130/72 | HR 64 | Temp 98.2°F | Resp 18 | Wt 186.0 lb

## 2021-11-04 DIAGNOSIS — E875 Hyperkalemia: Secondary | ICD-10-CM

## 2021-11-04 DIAGNOSIS — D696 Thrombocytopenia, unspecified: Secondary | ICD-10-CM | POA: Diagnosis not present

## 2021-11-04 DIAGNOSIS — N183 Chronic kidney disease, stage 3 unspecified: Secondary | ICD-10-CM

## 2021-11-04 DIAGNOSIS — D631 Anemia in chronic kidney disease: Secondary | ICD-10-CM

## 2021-11-04 DIAGNOSIS — I129 Hypertensive chronic kidney disease with stage 1 through stage 4 chronic kidney disease, or unspecified chronic kidney disease: Secondary | ICD-10-CM | POA: Diagnosis not present

## 2021-11-04 DIAGNOSIS — D649 Anemia, unspecified: Secondary | ICD-10-CM

## 2021-11-04 DIAGNOSIS — D509 Iron deficiency anemia, unspecified: Secondary | ICD-10-CM | POA: Diagnosis not present

## 2021-11-04 DIAGNOSIS — N1831 Chronic kidney disease, stage 3a: Secondary | ICD-10-CM | POA: Diagnosis not present

## 2021-11-04 LAB — CBC WITH DIFFERENTIAL/PLATELET
Abs Immature Granulocytes: 0.01 10*3/uL (ref 0.00–0.07)
Basophils Absolute: 0 10*3/uL (ref 0.0–0.1)
Basophils Relative: 1 %
Eosinophils Absolute: 0.3 10*3/uL (ref 0.0–0.5)
Eosinophils Relative: 7 %
HCT: 39.8 % (ref 39.0–52.0)
Hemoglobin: 11.8 g/dL — ABNORMAL LOW (ref 13.0–17.0)
Immature Granulocytes: 0 %
Lymphocytes Relative: 33 %
Lymphs Abs: 1.4 10*3/uL (ref 0.7–4.0)
MCH: 27.8 pg (ref 26.0–34.0)
MCHC: 29.6 g/dL — ABNORMAL LOW (ref 30.0–36.0)
MCV: 93.6 fL (ref 80.0–100.0)
Monocytes Absolute: 0.4 10*3/uL (ref 0.1–1.0)
Monocytes Relative: 9 %
Neutro Abs: 2.2 10*3/uL (ref 1.7–7.7)
Neutrophils Relative %: 50 %
Platelets: 300 10*3/uL (ref 150–400)
RBC: 4.25 MIL/uL (ref 4.22–5.81)
RDW: 18.6 % — ABNORMAL HIGH (ref 11.5–15.5)
WBC: 4.3 10*3/uL (ref 4.0–10.5)
nRBC: 0 % (ref 0.0–0.2)

## 2021-11-04 LAB — COMPREHENSIVE METABOLIC PANEL
ALT: 10 U/L (ref 0–44)
AST: 16 U/L (ref 15–41)
Albumin: 4.1 g/dL (ref 3.5–5.0)
Alkaline Phosphatase: 61 U/L (ref 38–126)
Anion gap: 6 (ref 5–15)
BUN: 20 mg/dL (ref 8–23)
CO2: 23 mmol/L (ref 22–32)
Calcium: 9.4 mg/dL (ref 8.9–10.3)
Chloride: 108 mmol/L (ref 98–111)
Creatinine, Ser: 1.08 mg/dL (ref 0.61–1.24)
GFR, Estimated: >60 mL/min (ref >60)
Glucose, Bld: 98 mg/dL (ref 70–99)
Potassium: 6.4 mmol/L (ref 3.5–5.1)
Sodium: 137 mmol/L (ref 135–145)
Total Bilirubin: 0.7 mg/dL (ref 0.3–1.2)
Total Protein: 7.3 g/dL (ref 6.5–8.1)

## 2021-11-04 LAB — IRON AND TIBC
Iron: 50 ug/dL (ref 45–182)
Saturation Ratios: 18 % (ref 17.9–39.5)
TIBC: 271 ug/dL (ref 250–450)
UIBC: 221 ug/dL

## 2021-11-04 LAB — FERRITIN: Ferritin: 311 ng/mL (ref 24–336)

## 2021-11-04 MED ORDER — SODIUM CHLORIDE 0.9 % IV SOLN: Freq: Once | INTRAVENOUS | Status: AC

## 2021-11-04 MED ORDER — INSULIN ASPART 100 UNIT/ML IJ SOLN
10.0000 [IU] | Freq: Once | INTRAMUSCULAR | Status: AC
  Administered 2021-11-04: 10 [IU] via INTRAVENOUS
  Filled 2021-11-04: qty 0.1

## 2021-11-04 MED ORDER — DEXTROSE 50 % IV SOLN
1.0000 | Freq: Once | INTRAVENOUS | Status: AC
  Administered 2021-11-04: 50 mL via INTRAVENOUS
  Filled 2021-11-04: qty 50

## 2021-11-04 MED ORDER — SODIUM POLYSTYRENE SULFONATE 15 GM/60ML PO SUSP: 15.0000 g | ORAL | 0 refills | Status: DC

## 2021-11-04 MED ORDER — CYANOCOBALAMIN 1000 MCG/ML IJ SOLN
1000.0000 ug | Freq: Once | INTRAMUSCULAR | Status: AC
  Administered 2021-11-04: 1000 ug via INTRAMUSCULAR
  Filled 2021-11-04: qty 1

## 2021-11-04 MED ORDER — SODIUM POLYSTYRENE SULFONATE 15 GM/60ML PO SUSP
60.0000 g | Freq: Once | ORAL | Status: AC
  Administered 2021-11-04: 60 g via ORAL
  Filled 2021-11-04: qty 240

## 2021-11-04 NOTE — Patient Instructions (Addendum)
[place] at [facility] Discharge Instructions  You were seen today by Tarri Abernethy PA-C.  ANEMIA: Your blood levels look much better.  We are going to change your Retacrit dosing back to 40,000 units every other week.  Please complete stool cards and return them to our office as soon as possible.  B12 DEFICIENCY: B12 levels are slightly low despite taking B12 supplement.  You can STOP taking your B12 pill at home.  We will start you on B12 injections once per month instead.  HIGH POTASSIUM: Your potassium was dangerously high, so you were given treatment today while you are in our clinic (insulin and D50), and were also sent home with Kayexalate.  Kayexalate is a medication that will give you diarrhea, which will help to remove the potassium from your body. - Take Kayexalate x1 dose (15 g / 60 mL) as soon as you get home this morning. - Take your next dose of Kayexalate (15 g / 60 mL) at about 4 PM today. - We will repeat your potassium labs tomorrow morning and will call you to let you know if you need to take any more Kayexalate. - Please CALL YOUR PRIMARY CARE DOCTOR to discuss your high potassium. - For the time being, you should STOP taking your daily potassium supplements.  FOLLOW-UP APPOINTMENT: Repeat labs and follow-up visit in 8 weeks.   Thank you for choosing [place] at [facility] to provide your oncology and hematology care.  To afford each patient quality time with our provider, please arrive at least 15 minutes before your scheduled appointment time.   If you have a lab appointment with the [facility] please come in thru the Main Entrance and check in at the main information desk.  You need to re-schedule your appointment should you arrive 10 or more minutes late.  We strive to give you quality time with our providers, and arriving late affects you and other patients whose appointments are after yours.  Also, if you no  show three or more times for appointments you may be dismissed from the clinic at the providers discretion.     Again, thank you for choosing [facility].  Our hope is that these requests will decrease the amount of time that you wait before being seen by our physicians.       _____________________________________________________________  Should you have questions after your visit to [facility], please contact our office at [phone] and follow the prompts.  Our office hours are 8:00 a.m. and 4:30 p.m. Monday - Friday.  Please note that voicemails left after 4:00 p.m. may not be returned until the following business day.  We are closed weekends and major holidays.  You do have access to a nurse 24-7, just call the main number to the clinic [phone] and do not press any options, hold on the line and a nurse will answer the phone.    For prescription refill requests, have your pharmacy contact our office and allow 72 hours.    Due to Covid, you will need to wear a mask upon entering the hospital. If you do not have a mask, a mask will be given to you at the Main Entrance upon arrival. For doctor visits, patients may have 1 support person age 81 or older with them. For treatment visits, patients can not have anyone with them due to social distancing guidelines and our immunocompromised population.

## 2021-11-04 NOTE — Patient Instructions (Addendum)
[place]  Discharge Instructions: Thank you for choosing [place] to provide your oncology and hematology care.  If you have a lab appointment with the [place], please come in thru the Main Entrance and check in at the main information desk.  Wear comfortable clothing and clothing appropriate for easy access to any Portacath or PICC line.   We strive to give you quality time with your provider. You may need to reschedule your appointment if you arrive late (15 or more minutes).  Arriving late affects you and other patients whose appointments are after yours.  Also, if you miss three or more appointments without notifying the office, you may be dismissed from the clinic at the provider's discretion.      For prescription refill requests, have your pharmacy contact our office and allow 72 hours for refills to be completed.    Today you received dextrose 50% IV, 10 units of Novolog, B12 injection, and Kayexelate to be taken a t home.   To help prevent nausea and vomiting after your treatment, we encourage you to take your nausea  BELOW ARE SYMPTOMS THAT SHOULD BE REPORTED IMMEDIATELY: *FEVER GREATER THAN 100.4 F (38 C) OR HIGHER *CHILLS OR SWEATING *NAUSEA AND VOMITING THAT IS NOT CONTROLLED WITH YOUR NAUSEA MEDICATION *UNUSUAL SHORTNESS OF BREATH *UNUSUAL BRUISING OR BLEEDING *URINARY PROBLEMS (pain or burning when urinating, or frequent urination) *BOWEL PROBLEMS (unusual diarrhea, constipation, pain near the anus) TENDERNESS IN MOUTH AND THROAT WITH OR WITHOUT PRESENCE OF ULCERS (sore throat, sores in mouth, or a toothache) UNUSUAL RASH, SWELLING OR PAIN  UNUSUAL VAGINAL DISCHARGE OR ITCHING   Items with * indicate a potential emergency and should be followed up as soon as possible or go to the Emergency Department if any problems should occur.  Please show the CHEMOTHERAPY ALERT CARD or IMMUNOTHERAPY ALERT CARD at check-in to the Emergency  Department and triage nurse.  Should you have questions after your visit or need to cancel or reschedule your appointment, please contact [facility] [phone]  and follow the prompts.  Office hours are 8:00 a.m. to 4:30 p.m. Monday - Friday. Please note that voicemails left after 4:00 p.m. may not be returned until the following business day.  We are closed weekends and major holidays. You have access to a nurse at all times for urgent questions. Please call the main number to the clinic [phone] and follow the prompts.  For any non-urgent questions, you may also contact your provider using MyChart. We now offer e-Visits for anyone 23 and older to request care online for non-urgent symptoms. For details visit [email or web address].   Also download the MyChart app! Go to the app store, search "MyChart", open the app, select [place], and log in with your MyChart username and password.  Due to Covid, a mask is required upon entering the hospital/clinic. If you do not have a mask, one will be given to you upon arrival. For doctor visits, patients may have 1 support person aged 82 or older with them. For treatment visits, patients cannot have anyone with them due to current Covid guidelines and our immunocompromised population.

## 2021-11-04 NOTE — Progress Notes (Signed)
Patient presents today for Retacrit injection and follow up visit with R Pennington PA. Potassium 6.4 today. HGB 11.8. NO Retacrit given today.   Message received from [facility] PA to give Kayexalate 60 grams, Novolog 10 units IV, and Dextrose 50 1 Ampule IV push.

## 2021-11-04 NOTE — Progress Notes (Signed)
Novolog 10 units IV, Dextrose 50 1 Ampule IV push, B12 injection, and Kayexalate 60 grams to be taken at home, given today per MD orders. Tolerated infusion without adverse affects. Vital signs stable. No complaints at this time. Discharged from clinic ambulatory in stable condition. Alert and oriented x 3. F/U with [facility] as scheduled.

## 2021-11-04 NOTE — Progress Notes (Signed)
[place], [place] [address]   CLINIC:  Medical Oncology/Hematology  PCP:  Curlene Labrum, MD [place] [address] [phone]   REASON FOR VISIT:  Follow-up for anemia of CKD and iron deficiency   PRIOR THERAPY: None   CURRENT THERAPY: Retacrit injections every 2 weeks; intermittent parenteral iron therapy; oral iron supplement  INTERVAL HISTORY:  [name] 75 y.o. male returns for routine follow-up of CKD and functional iron deficiency.  He was last seen by Tarri Abernethy PA-C on 10/07/2021.  At today's visit, he reports feeling fairly well.  No recent hospitalizations, surgeries, or changes in baseline health status.  He reports that he tolerated his IV iron infusions increased dose of Retacrit well, and is actually feeling much better than he did at his last appointment.  He reports improved energy and decreased lightheadedness.  He continues to have some chest pain and palpitations associated with his underlying anxiety and PTSD.  He denies any current fatigue, pica, restless legs, dyspnea on exertion, or syncope.  He has not noticed any bleeding such as epistaxis, hematemesis, hematochezia, or melena.  He has not yet completed the stool cards that were sent home with him because he reports he has been constipated for the past 2 weeks, only having bowel movements about twice per week.  He has 100% energy and 85% appetite. He endorses that he is maintaining a stable weight.    REVIEW OF SYSTEMS:  Review of Systems  Constitutional:  Negative for appetite change, chills, diaphoresis, fatigue, fever and unexpected weight change.  HENT:   Negative for lump/mass and nosebleeds.   Eyes:  Negative for eye problems.  Respiratory:  Negative for cough, hemoptysis and shortness of breath.   Cardiovascular:  Positive for chest pain (Associated with anxiety/PTSD) and palpitations. Negative for leg swelling.  Gastrointestinal:   Positive for constipation, diarrhea and nausea. Negative for abdominal pain, blood in stool and vomiting.  Genitourinary:  Negative for hematuria.   Skin: Negative.   Neurological:  Positive for headaches, light-headedness (Improved) and numbness (Chronic neuropathy in hands and feet, worse in the winter). Negative for dizziness.  Hematological:  Does not bruise/bleed easily.  Psychiatric/Behavioral:  Positive for sleep disturbance.      PAST MEDICAL/SURGICAL HISTORY:  Past Medical History:  Diagnosis Date   Alcohol abuse 11/24/2016   Anemia 10/11/2013   Cardiomyopathy ([facility])    a. EF 50% by echo in 2019 with NST showing no reversible ischemia and thought to be alcohol-induced   Chronic renal disease, stage 3, moderately decreased glomerular filtration rate (GFR) between 30-59 mL/min/1.73 square meter (HCC) 12/02/2016   Colon polyps    GERD (gastroesophageal reflux disease)    High cholesterol    Hypertension    Sinus infection    Past Surgical History:  Procedure Laterality Date   BIOPSY  06/03/2019   Procedure: BIOPSY;  Surgeon: Rogene Houston, MD;  [facility];  Service: Endoscopy;;  esophagus   COLONOSCOPY N/A 10/26/2013   Procedure: COLONOSCOPY;  Surgeon: Rogene Houston, MD;  [facility];  Service: Endoscopy;  Laterality: N/A;  325-moved to 1230 Ann to notify pt   Colonoscopy with polypectomy     COLONOSCOPY WITH PROPOFOL N/A 06/03/2019   Procedure: COLONOSCOPY WITH PROPOFOL;  Surgeon: Rogene Houston, MD;  [facility];  Service: Endoscopy;  Laterality: N/A;   ESOPHAGOGASTRODUODENOSCOPY (EGD) WITH PROPOFOL N/A 06/03/2019   Procedure: ESOPHAGOGASTRODUODENOSCOPY (  EGD) WITH PROPOFOL;  Surgeon: Rogene Houston, MD;  [facility];  Service: Endoscopy;  Laterality: N/A;   POLYPECTOMY  06/03/2019   Procedure: POLYPECTOMY;  Surgeon: Rogene Houston, MD;  [facility];  Service: Endoscopy;;  colon     SOCIAL HISTORY:   Social History   Socioeconomic History   Marital status: Married    Spouse name: Not on file   Number of children: Not on file   Years of education: Not on file   Highest education level: Not on file  Occupational History   Not on file  Tobacco Use   Smoking status: Former    Packs/day: 0.50    Years: 20.00    Pack years: 10.00    Types: Cigarettes    Quit date: 12/27/1971    Years since quitting: 49.8   Smokeless tobacco: Never  Vaping Use   Vaping Use: Never used  Substance and Sexual Activity   Alcohol use: Yes    Comment: 1 pint of etoh a day ( not recently)   Drug use: No   Sexual activity: Not on file  Other Topics Concern   Not on file  Social History Narrative   Not on file   Social Determinants of Health   Financial Resource Strain: Not on file  Food Insecurity: No Food Insecurity   Worried About Running Out of Food in the Last Year: Never true   [place] in the Last Year: Never true  Transportation Needs: No Transportation Needs   Lack of Transportation (Medical): No   Lack of Transportation (Non-Medical): No  Physical Activity: Inactive   Days of Exercise per Week: 0 days   Minutes of Exercise per Session: 0 min  Stress: No Stress Concern Present   Feeling of Stress : Not at all  Social Connections: Not on file  Intimate Partner Violence: Not At Risk   Fear of Current or Ex-Partner: No   Emotionally Abused: No   Physically Abused: No   Sexually Abused: No    FAMILY HISTORY:  Family History  Problem Relation Age of Onset   Hypertension Mother    Colon cancer Neg Hx     CURRENT MEDICATIONS:  Outpatient Encounter Medications as of 11/04/2021  Medication Sig Note   ALPRAZolam (XANAX) 1 MG tablet     aspirin 81 MG tablet Take 1 tablet (81 mg total) by mouth daily.    atorvastatin (LIPITOR) 10 MG tablet Take 10 mg by mouth daily.    buPROPion (WELLBUTRIN SR) 150 MG 12 hr tablet Take 150 mg by mouth daily.     chlorhexidine (PERIDEX) 0.12  % solution     cholecalciferol (VITAMIN D) 1000 UNITS tablet Take 1,000 Units by mouth 2 (two) times daily.    donepezil (ARICEPT) 10 MG tablet     Eszopiclone 3 MG TABS Take 3 mg by mouth at bedtime.     Ferrous Sulfate (IRON) 325 (65 Fe) MG TABS TAKE ONE TABLET BY MOUTH DAILY WITH BREAKFAST. (Patient taking differently: 1 tablet 2 (two) times a day.)    folic acid (FOLVITE) 1 MG tablet Take 1 mg by mouth daily.    furosemide (LASIX) 20 MG tablet Take 20 mg by mouth daily.     gabapentin (NEURONTIN) 300 MG capsule Take 300 mg by mouth 3 (three) times daily as needed. 10/21/2016: Received from: External Pharmacy   GNP VITAMIN B-1 100 MG tablet Take 100 mg by mouth daily.  losartan (COZAAR) 50 MG tablet Take 50 mg by mouth daily.    metoprolol succinate (TOPROL XL) 25 MG 24 hr tablet Take 1 tablet (25 mg total) by mouth daily.    nitroGLYCERIN (NITROSTAT) 0.4 MG SL tablet Place 1 tablet (0.4 mg total) under the tongue every 5 (five) minutes as needed for chest pain.    omeprazole (PRILOSEC) 20 MG capsule Take 20 mg by mouth daily as needed (Acid Reflux).    potassium chloride SA (K-DUR) 20 MEQ tablet Take 20 mEq by mouth daily.     QUEtiapine (SEROQUEL) 100 MG tablet     tamsulosin (FLOMAX) 0.4 MG CAPS capsule     traZODone (DESYREL) 100 MG tablet Take 100 mg by mouth at bedtime.    vitamin B-12 (CYANOCOBALAMIN) 1000 MCG tablet Take 1,000 mcg by mouth daily.    ziprasidone (GEODON) 40 MG capsule Take 40 mg by mouth 2 (two) times daily with a meal.     No facility-administered encounter medications on file as of 11/04/2021.    ALLERGIES:  Allergies  Allergen Reactions   Horseradish [Armoracia Rusticana Ext (Horseradish)] Anaphylaxis     PHYSICAL EXAM:  ECOG PERFORMANCE STATUS: 1 - Symptomatic but completely ambulatory  There were no vitals filed for this visit. There were no vitals filed for this visit. Physical Exam Constitutional:      Appearance: Normal appearance. He is  obese.  HENT:     Head: Normocephalic and atraumatic.     Mouth/Throat:     Mouth: Mucous membranes are moist.  Eyes:     Extraocular Movements: Extraocular movements intact.     Pupils: Pupils are equal, round, and reactive to light.  Cardiovascular:     Rate and Rhythm: Normal rate and regular rhythm.     Pulses: Normal pulses.     Heart sounds: Normal heart sounds.  Pulmonary:     Effort: Pulmonary effort is normal.     Breath sounds: Normal breath sounds.  Abdominal:     General: Bowel sounds are normal.     Palpations: Abdomen is soft.     Tenderness: There is no abdominal tenderness.  Musculoskeletal:        General: No swelling.     Right lower leg: No edema.     Left lower leg: No edema.  Lymphadenopathy:     Cervical: No cervical adenopathy.  Skin:    General: Skin is warm and dry.  Neurological:     General: No focal deficit present.     Mental Status: He is alert and oriented to person, place, and time.  Psychiatric:        Mood and Affect: Mood normal.        Behavior: Behavior normal.     LABORATORY DATA:  I have reviewed the labs as listed.  CBC    Component Value Date/Time   WBC 4.3 11/04/2021 0812   RBC 4.25 11/04/2021 0812   HGB 11.8 (L) 11/04/2021 0812   HCT 39.8 11/04/2021 0812   HCT 27.9 (L) 08/13/2016 0944   PLT 300 11/04/2021 0812   MCV 93.6 11/04/2021 0812   MCH 27.8 11/04/2021 0812   MCHC 29.6 (L) 11/04/2021 0812   RDW 18.6 (H) 11/04/2021 0812   LYMPHSABS 1.4 11/04/2021 0812   MONOABS 0.4 11/04/2021 0812   EOSABS 0.3 11/04/2021 0812   BASOSABS 0.0 11/04/2021 0812   CMP Latest Ref Rng & Units 09/20/2021 06/11/2021 05/28/2021  Glucose 70 - 99 mg/dL 98 98 106(H)  BUN 8 - 23 mg/dL '12 13 12  ' Creatinine 0.61 - 1.24 mg/dL 1.75(H) 0.95 1.21  Sodium 135 - 145 mmol/L 136 139 139  Potassium 3.5 - 5.1 mmol/L 3.3(L) 3.8 3.7  Chloride 98 - 111 mmol/L 95(L) 105 96(L)  CO2 22 - 32 mmol/L 32 29 34(H)  Calcium 8.9 - 10.3 mg/dL 6.8(L) 7.9(L) 8.4(L)   Total Protein 6.5 - 8.1 g/dL 6.2(L) 6.2(L) 6.8  Total Bilirubin 0.3 - 1.2 mg/dL 0.9 0.7 1.9(H)  Alkaline Phos 38 - 126 U/L 73 50 72  AST 15 - 41 U/L 68(H) 18 25  ALT 0 - 44 U/L '24 9 18    ' DIAGNOSTIC IMAGING:  I have independently reviewed the relevant imaging and discussed with the patient.  ASSESSMENT & PLAN: 1.   Normocytic anemia, likely secondary to CKD stage II/IIIa and functional iron deficiency: - His anemia dates back to at least 2014 with a negative peripheral work-up, negative bone marrow aspiration and biopsy and cytogenetics on 10/16/2016 in the setting of alcohol abuse.  The bone marrow showed slightly hypercellular marrow with trilineage hematopoiesis.  Chromosome analysis was normal. - MGUS/myeloma panel (09/20/2021) is negative (no M spike on SPEP, immunofixation negative for monoclonal protein).  Elevated kappa (74.9) and lambda (54.1) light chains in the setting of chronic kidney disease, both normal light chain ratio 1.38. - He has been receiving intermittent Epogen and parenteral iron therapy since June 2020. - He is a Jehovah witness and does not receive blood products. - Last colonoscopy and EGD on 06/03/2019: Mild esophagitis and duodenitis on EGD, polyps and external hemorrhoids on colonoscopy - He is tolerating Epogen 40,000 units weekly  - He continues to take iron tablets 325 mg once daily.   - He reports dark bowel movements, possibly from iron pill.  He denies any bright red blood per rectum, hematemesis, or epistaxis.   - Iron panel (09/20/2021): Consistent with anemia of chronic disease and functional iron deficiency, with elevated ferritin 1070 but low serum iron 16, low iron saturation 9%, and low TIBC 184 - Due to worsening anemia and functional iron deficiency, patient was given 200 mg IV Venofer x2 in coordination with his Retacrit  - Most recent CBC (11/04/2021) is improved with Hgb 11.8, compared to Hgb 7.9 (10/07/2021) - Iron panel has normalized  (11/04/2021): Ferritin 311, iron saturation 18%, TIBC 271 - Differential diagnosis includes anemia related to mild CKD, functional iron deficiency, and anemia of chronic disease.  Patient may also have some occult GI blood loss. - Unable to exclude early MDS, will consider a repeat bone marrow biopsy in the future if there are any significant deviations from baseline - History of previous heavy alcohol abuse may be a contributing factor to anemia given its bone marrow toxicity - PLAN: Given the rapid improvement in his hemoglobin after correction of functional iron deficiency, we will DECREASE his Retacrit back to 40,000 units every other week. - Repeat labs (CBC, CMP, iron/TIBC, ferritin) and RTC in 8 weeks - Patient reminded to complete stool cards   2. AKI on CKD stage II/IIIa -Last labs (09/20/2021) showed creatinine 1.75, slightly elevated from baseline.  This is improved on most recent labs (11/04/2021) with creatinine 1.08. - PLAN: Continue follow-up with PCP for chronic kidney disease, may need referral to nephrology in the future, at PCP discretion.  Avoid NSAIDs and maintain adequate hydration.   3. Thrombocytopenia, mild - RESOLVED - Mild thrombocytopenia noted on recent CBC (09/20/2021) with platelets 129 - Has  resolved, with most recent CBC (10/04/2021) showing platelets 300  4.  B12 deficiency -Labs from 10/07/2021 showed normal B12 433, but elevated methylmalonic acid at 470, signifying mild B12 deficiency and despite patient taking taking B12 daily 1,000 mcg - PLAN: Stop taking B12 cyanocobalamin 1000 mcg daily.  START monthly B12 injections.  5.  Hyperkalemia - CMP today (11/04/2021) showed critical hyperkalemia with potassium 6.4. - Patient is asymptomatic apart from constipation, which may have contributed to development of hyperkalemia - He takes potassium supplement daily at home, as prescribed by PCP - PLAN: Patient given 10 units of insulin and 1 amp of D50 during visit  today. - Sent home with Kayexalate x4 doses (15 g / 60 mL each).  He will take 2 doses today. - Repeat CMP tomorrow morning, will call patient with results to let him know if he needs to take any additional doses of Kayexalate. - Patient instructed to STOP his daily potassium supplement and call his PCP for further instructions and management.   PLAN SUMMARY & DISPOSITION: -Recheck CMP tomorrow due to hyperkalemia (treated) today - Monthly B12 injections - Retacrit 40,000 units every other week - Labs and RTC in 8 weeks  All questions were answered. The patient knows to call the clinic with any problems, questions or concerns.  Medical decision making: Moderate  Time spent on visit: I spent 20 minutes counseling the patient face to face. The total time spent in the appointment was 30 minutes and more than 50% was on counseling.   Harriett Rush, PA-C  11/04/2021 1:05 PM

## 2021-11-04 NOTE — Progress Notes (Unsigned)
CRITICAL VALUE ALERT Critical value received:  Potassium 6.4 Date of notification:  11/04/21 Time of notification: 08:40 am  Critical value read back:  Yes.   Nurse who received alert:  B Gema Ringold RN / Received alert from Rubie Maid.  MD notified time and response:  Billey Co PA, Karna Christmas RN.

## 2021-11-05 ENCOUNTER — Inpatient Hospital Stay (HOSPITAL_COMMUNITY): Payer: Medicare Other

## 2021-11-05 DIAGNOSIS — D631 Anemia in chronic kidney disease: Secondary | ICD-10-CM | POA: Diagnosis not present

## 2021-11-05 DIAGNOSIS — D509 Iron deficiency anemia, unspecified: Secondary | ICD-10-CM | POA: Diagnosis not present

## 2021-11-05 DIAGNOSIS — D696 Thrombocytopenia, unspecified: Secondary | ICD-10-CM | POA: Diagnosis not present

## 2021-11-05 DIAGNOSIS — E875 Hyperkalemia: Secondary | ICD-10-CM | POA: Diagnosis not present

## 2021-11-05 DIAGNOSIS — I129 Hypertensive chronic kidney disease with stage 1 through stage 4 chronic kidney disease, or unspecified chronic kidney disease: Secondary | ICD-10-CM | POA: Diagnosis not present

## 2021-11-05 DIAGNOSIS — N1831 Chronic kidney disease, stage 3a: Secondary | ICD-10-CM | POA: Diagnosis not present

## 2021-11-05 LAB — COMPREHENSIVE METABOLIC PANEL
ALT: 10 U/L (ref 0–44)
AST: 14 U/L — ABNORMAL LOW (ref 15–41)
Albumin: 4 g/dL (ref 3.5–5.0)
Alkaline Phosphatase: 58 U/L (ref 38–126)
Anion gap: 6 (ref 5–15)
BUN: 19 mg/dL (ref 8–23)
CO2: 24 mmol/L (ref 22–32)
Calcium: 9.1 mg/dL (ref 8.9–10.3)
Chloride: 105 mmol/L (ref 98–111)
Creatinine, Ser: 1.06 mg/dL (ref 0.61–1.24)
GFR, Estimated: >60 mL/min (ref >60)
Glucose, Bld: 93 mg/dL (ref 70–99)
Potassium: 5.6 mmol/L — ABNORMAL HIGH (ref 3.5–5.1)
Sodium: 135 mmol/L (ref 135–145)
Total Bilirubin: 0.6 mg/dL (ref 0.3–1.2)
Total Protein: 7.2 g/dL (ref 6.5–8.1)

## 2021-11-05 NOTE — Progress Notes (Signed)
Patient's potassium is improved, but still elevated at 5.6. He should still have some Kayexalate leftover.  Please have him take 2 more doses of Kayexalate today (each dose is 15 g / 60 mL). Please make sure that he discussed with his primary care provider for further work-up and management of his hyperkalemia. Please remind him to NOT take his potassium supplement at home.

## 2021-11-06 ENCOUNTER — Ambulatory Visit (HOSPITAL_COMMUNITY): Payer: Medicare Other

## 2021-11-06 ENCOUNTER — Ambulatory Visit (HOSPITAL_COMMUNITY): Payer: Medicare Other | Admitting: Physician Assistant

## 2021-11-06 ENCOUNTER — Other Ambulatory Visit (HOSPITAL_COMMUNITY): Payer: Medicare Other

## 2021-11-07 ENCOUNTER — Other Ambulatory Visit (HOSPITAL_COMMUNITY): Payer: Self-pay | Admitting: *Deleted

## 2021-11-07 DIAGNOSIS — E875 Hyperkalemia: Secondary | ICD-10-CM

## 2021-11-07 MED ORDER — SODIUM POLYSTYRENE SULFONATE 15 GM/60ML PO SUSP: 15.0000 g | ORAL | 0 refills | Status: DC

## 2021-11-07 NOTE — Progress Notes (Signed)
Patient called.  Patient aware.  Attempted multiple times to contact him previous to this conversation with no answer.  Verbalized understanding.  Des not have any kayexalate left.  Script sent to pharmacy.

## 2021-11-11 ENCOUNTER — Other Ambulatory Visit (HOSPITAL_COMMUNITY): Payer: Medicare Other

## 2021-11-11 ENCOUNTER — Inpatient Hospital Stay (HOSPITAL_COMMUNITY): Payer: Medicare Other

## 2021-11-18 ENCOUNTER — Inpatient Hospital Stay (HOSPITAL_COMMUNITY): Payer: Medicare Other

## 2021-11-18 ENCOUNTER — Other Ambulatory Visit: Payer: Self-pay

## 2021-11-18 ENCOUNTER — Inpatient Hospital Stay (HOSPITAL_COMMUNITY): Payer: Medicare Other | Attending: Hematology

## 2021-11-18 DIAGNOSIS — D500 Iron deficiency anemia secondary to blood loss (chronic): Secondary | ICD-10-CM

## 2021-11-18 DIAGNOSIS — E538 Deficiency of other specified B group vitamins: Secondary | ICD-10-CM | POA: Insufficient documentation

## 2021-11-18 DIAGNOSIS — D696 Thrombocytopenia, unspecified: Secondary | ICD-10-CM

## 2021-11-18 LAB — CBC
HCT: 37 % — ABNORMAL LOW (ref 39.0–52.0)
Hemoglobin: 11.2 g/dL — ABNORMAL LOW (ref 13.0–17.0)
MCH: 27.5 pg (ref 26.0–34.0)
MCHC: 30.3 g/dL (ref 30.0–36.0)
MCV: 90.9 fL (ref 80.0–100.0)
Platelets: 201 10*3/uL (ref 150–400)
RBC: 4.07 MIL/uL — ABNORMAL LOW (ref 4.22–5.81)
RDW: 17.2 % — ABNORMAL HIGH (ref 11.5–15.5)
WBC: 4.5 10*3/uL (ref 4.0–10.5)
nRBC: 0 % (ref 0.0–0.2)

## 2021-11-18 NOTE — Progress Notes (Signed)
Pt presents today for Retacrit per provider's order. NO Retacrit given today Hgb 11.2. Discharged from clinic ambulatory in stable condition. Alert and oriented x 3. F/U with [facility] as scheduled.

## 2021-11-29 ENCOUNTER — Ambulatory Visit (HOSPITAL_COMMUNITY)
Admission: RE | Admit: 2021-11-29 | Discharge: 2021-11-29 | Disposition: A | Discharge status: Home / Self Care | Payer: Medicare Other | Source: Ambulatory Visit | Attending: Internal Medicine | Admitting: Internal Medicine

## 2021-11-29 DIAGNOSIS — F1010 Alcohol abuse, uncomplicated: Secondary | ICD-10-CM | POA: Diagnosis present

## 2021-11-29 DIAGNOSIS — I10 Essential (primary) hypertension: Secondary | ICD-10-CM | POA: Diagnosis not present

## 2021-11-29 DIAGNOSIS — N183 Chronic kidney disease, stage 3 unspecified: Secondary | ICD-10-CM | POA: Diagnosis not present

## 2021-11-29 DIAGNOSIS — I509 Heart failure, unspecified: Secondary | ICD-10-CM

## 2021-11-29 LAB — ECHOCARDIOGRAM COMPLETE
Area-P 1/2: 3.21 cm2
S' Lateral: 3.6 cm

## 2021-11-29 NOTE — Progress Notes (Signed)
*  PRELIMINARY RESULTS* Echocardiogram 2D Echocardiogram has been performed.  Roger Hansen 11/29/2021, 9:21 AM

## 2021-12-02 ENCOUNTER — Encounter (HOSPITAL_COMMUNITY): Payer: Self-pay

## 2021-12-02 ENCOUNTER — Inpatient Hospital Stay (HOSPITAL_COMMUNITY): Payer: Medicare Other

## 2021-12-02 ENCOUNTER — Other Ambulatory Visit: Payer: Self-pay

## 2021-12-02 VITALS — BP 125/65 | HR 62 | Temp 97.7°F | Resp 18 | Ht 65.0 in | Wt 194.4 lb

## 2021-12-02 DIAGNOSIS — D649 Anemia, unspecified: Secondary | ICD-10-CM

## 2021-12-02 DIAGNOSIS — D500 Iron deficiency anemia secondary to blood loss (chronic): Secondary | ICD-10-CM

## 2021-12-02 DIAGNOSIS — E538 Deficiency of other specified B group vitamins: Secondary | ICD-10-CM | POA: Diagnosis not present

## 2021-12-02 DIAGNOSIS — D696 Thrombocytopenia, unspecified: Secondary | ICD-10-CM

## 2021-12-02 DIAGNOSIS — N183 Chronic kidney disease, stage 3 unspecified: Secondary | ICD-10-CM

## 2021-12-02 LAB — CBC
HCT: 37 % — ABNORMAL LOW (ref 39.0–52.0)
Hemoglobin: 11.4 g/dL — ABNORMAL LOW (ref 13.0–17.0)
MCH: 27.9 pg (ref 26.0–34.0)
MCHC: 30.8 g/dL (ref 30.0–36.0)
MCV: 90.5 fL (ref 80.0–100.0)
Platelets: 194 10*3/uL (ref 150–400)
RBC: 4.09 MIL/uL — ABNORMAL LOW (ref 4.22–5.81)
RDW: 16.4 % — ABNORMAL HIGH (ref 11.5–15.5)
WBC: 4.4 10*3/uL (ref 4.0–10.5)
nRBC: 0 % (ref 0.0–0.2)

## 2021-12-02 MED ORDER — CYANOCOBALAMIN 1000 MCG/ML IJ SOLN
1000.0000 ug | Freq: Once | INTRAMUSCULAR | Status: AC
  Administered 2021-12-02: 10:00:00 1000 ug via INTRAMUSCULAR
  Filled 2021-12-02: qty 1

## 2021-12-02 NOTE — Patient Instructions (Signed)
[facility]  Discharge Instructions: Thank you for choosing [facility] to provide your oncology and hematology care.  If you have a lab appointment with the Cancer Center, please come in thru the Main Entrance and check in at the main information desk.  Wear comfortable clothing and clothing appropriate for easy access to any Portacath or PICC line.   We strive to give you quality time with your provider. You may need to reschedule your appointment if you arrive late (15 or more minutes).  Arriving late affects you and other patients whose appointments are after yours.  Also, if you miss three or more appointments without notifying the office, you may be dismissed from the clinic at the provider's discretion.      For prescription refill requests, have your pharmacy contact our office and allow 72 hours for refills to be completed.        To help prevent nausea and vomiting after your treatment, we encourage you to take your nausea medication as directed.  BELOW ARE SYMPTOMS THAT SHOULD BE REPORTED IMMEDIATELY: *FEVER GREATER THAN 100.4 F (38 C) OR HIGHER *CHILLS OR SWEATING *NAUSEA AND VOMITING THAT IS NOT CONTROLLED WITH YOUR NAUSEA MEDICATION *UNUSUAL SHORTNESS OF BREATH *UNUSUAL BRUISING OR BLEEDING *URINARY PROBLEMS (pain or burning when urinating, or frequent urination) *BOWEL PROBLEMS (unusual diarrhea, constipation, pain near the anus) TENDERNESS IN MOUTH AND THROAT WITH OR WITHOUT PRESENCE OF ULCERS (sore throat, sores in mouth, or a toothache) UNUSUAL RASH, SWELLING OR PAIN  UNUSUAL VAGINAL DISCHARGE OR ITCHING   Items with * indicate a potential emergency and should be followed up as soon as possible or go to the Emergency Department if any problems should occur.  Please show the CHEMOTHERAPY ALERT CARD or IMMUNOTHERAPY ALERT CARD at check-in to the Emergency Department and triage nurse.  Should you have questions after your visit or need to cancel  or reschedule your appointment, please contact [facility] [phone]  and follow the prompts.  Office hours are 8:00 a.m. to 4:30 p.m. Monday - Friday. Please note that voicemails left after 4:00 p.m. may not be returned until the following business day.  We are closed weekends and major holidays. You have access to a nurse at all times for urgent questions. Please call the main number to the clinic [phone] and follow the prompts.  For any non-urgent questions, you may also contact your provider using MyChart. We now offer e-Visits for anyone 18 and older to request care online for non-urgent symptoms. For details visit [email or web address].   Also download the MyChart app! Go to the app store, search "MyChart", open the app, select [place], and log in with your MyChart username and password.  Due to Covid, a mask is required upon entering the hospital/clinic. If you do not have a mask, one will be given to you upon arrival. For doctor visits, patients may have 1 support person aged 18 or older with them. For treatment visits, patients cannot have anyone with them due to current Covid guidelines and our immunocompromised population.  

## 2021-12-02 NOTE — Progress Notes (Signed)
Patient tolerated injection with no complaints voiced.  Site clean and dry with no bruising or swelling noted at site.  See MAR for details.  Band aid applied.  Patient stable during and after injection.  Vss with discharge and left in satisfactory condition with no s/s of distress noted.  Hgb 11.4 today.  No complaints voiced.

## 2021-12-10 DIAGNOSIS — D6489 Other specified anemias: Secondary | ICD-10-CM | POA: Diagnosis not present

## 2021-12-10 DIAGNOSIS — E875 Hyperkalemia: Secondary | ICD-10-CM | POA: Diagnosis not present

## 2021-12-10 DIAGNOSIS — M19071 Primary osteoarthritis, right ankle and foot: Secondary | ICD-10-CM | POA: Diagnosis not present

## 2021-12-10 DIAGNOSIS — Z6833 Body mass index (BMI) 33.0-33.9, adult: Secondary | ICD-10-CM | POA: Diagnosis not present

## 2021-12-10 DIAGNOSIS — I426 Alcoholic cardiomyopathy: Secondary | ICD-10-CM | POA: Diagnosis not present

## 2021-12-18 ENCOUNTER — Other Ambulatory Visit: Payer: Self-pay

## 2021-12-18 ENCOUNTER — Inpatient Hospital Stay (HOSPITAL_COMMUNITY): Payer: Medicare Other

## 2021-12-18 ENCOUNTER — Inpatient Hospital Stay (HOSPITAL_COMMUNITY): Payer: Medicare Other | Attending: Hematology

## 2021-12-18 VITALS — BP 119/60 | HR 58 | Temp 97.5°F | Resp 18 | Ht 65.0 in | Wt 198.2 lb

## 2021-12-18 DIAGNOSIS — E538 Deficiency of other specified B group vitamins: Secondary | ICD-10-CM | POA: Diagnosis not present

## 2021-12-18 DIAGNOSIS — N182 Chronic kidney disease, stage 2 (mild): Secondary | ICD-10-CM | POA: Diagnosis not present

## 2021-12-18 DIAGNOSIS — D509 Iron deficiency anemia, unspecified: Secondary | ICD-10-CM | POA: Diagnosis not present

## 2021-12-18 DIAGNOSIS — D649 Anemia, unspecified: Secondary | ICD-10-CM

## 2021-12-18 DIAGNOSIS — D631 Anemia in chronic kidney disease: Secondary | ICD-10-CM | POA: Insufficient documentation

## 2021-12-18 DIAGNOSIS — N183 Chronic kidney disease, stage 3 unspecified: Secondary | ICD-10-CM

## 2021-12-18 DIAGNOSIS — N2889 Other specified disorders of kidney and ureter: Secondary | ICD-10-CM

## 2021-12-18 DIAGNOSIS — D500 Iron deficiency anemia secondary to blood loss (chronic): Secondary | ICD-10-CM

## 2021-12-18 DIAGNOSIS — D696 Thrombocytopenia, unspecified: Secondary | ICD-10-CM

## 2021-12-18 LAB — CBC
HCT: 33.9 % — ABNORMAL LOW (ref 39.0–52.0)
Hemoglobin: 10.5 g/dL — ABNORMAL LOW (ref 13.0–17.0)
MCH: 27.6 pg (ref 26.0–34.0)
MCHC: 31 g/dL (ref 30.0–36.0)
MCV: 89.2 fL (ref 80.0–100.0)
Platelets: 193 10*3/uL (ref 150–400)
RBC: 3.8 MIL/uL — ABNORMAL LOW (ref 4.22–5.81)
RDW: 16.4 % — ABNORMAL HIGH (ref 11.5–15.5)
WBC: 5.3 10*3/uL (ref 4.0–10.5)
nRBC: 0 % (ref 0.0–0.2)

## 2021-12-18 MED ORDER — EPOETIN ALFA-EPBX 40000 UNIT/ML IJ SOLN
40000.0000 [IU] | Freq: Once | INTRAMUSCULAR | Status: AC
  Administered 2021-12-18: 40000 [IU] via SUBCUTANEOUS
  Filled 2021-12-18: qty 1

## 2021-12-18 NOTE — Progress Notes (Signed)
Roger Hansen presents today for Retacrit injection per the provider's orders.  Stable during administration without incident; injection site WNL; see MAR for injection details.  Patient tolerated procedure well and without incident.  No questions or complaints noted at this time. Discharge from clinic ambulatory in stable condition.  Alert and oriented X 3.  Follow up with [facility] as scheduled.

## 2021-12-18 NOTE — Patient Instructions (Signed)
[facility]  Discharge Instructions: Thank you for choosing [facility] to provide your oncology and hematology care.  If you have a lab appointment with the Cancer Center, please come in thru the Main Entrance and check in at the main information desk.  Wear comfortable clothing and clothing appropriate for easy access to any Portacath or PICC line.   We strive to give you quality time with your provider. You may need to reschedule your appointment if you arrive late (15 or more minutes).  Arriving late affects you and other patients whose appointments are after yours.  Also, if you miss three or more appointments without notifying the office, you may be dismissed from the clinic at the provider's discretion.      For prescription refill requests, have your pharmacy contact our office and allow 72 hours for refills to be completed.    Today you received the following chemotherapy and/or immunotherapy agents Retacrit      To help prevent nausea and vomiting after your treatment, we encourage you to take your nausea medication as directed.  BELOW ARE SYMPTOMS THAT SHOULD BE REPORTED IMMEDIATELY: *FEVER GREATER THAN 100.4 F (38 C) OR HIGHER *CHILLS OR SWEATING *NAUSEA AND VOMITING THAT IS NOT CONTROLLED WITH YOUR NAUSEA MEDICATION *UNUSUAL SHORTNESS OF BREATH *UNUSUAL BRUISING OR BLEEDING *URINARY PROBLEMS (pain or burning when urinating, or frequent urination) *BOWEL PROBLEMS (unusual diarrhea, constipation, pain near the anus) TENDERNESS IN MOUTH AND THROAT WITH OR WITHOUT PRESENCE OF ULCERS (sore throat, sores in mouth, or a toothache) UNUSUAL RASH, SWELLING OR PAIN  UNUSUAL VAGINAL DISCHARGE OR ITCHING   Items with * indicate a potential emergency and should be followed up as soon as possible or go to the Emergency Department if any problems should occur.  Please show the CHEMOTHERAPY ALERT CARD or IMMUNOTHERAPY ALERT CARD at check-in to the Emergency  Department and triage nurse.  Should you have questions after your visit or need to cancel or reschedule your appointment, please contact [facility] [phone]  and follow the prompts.  Office hours are 8:00 a.m. to 4:30 p.m. Monday - Friday. Please note that voicemails left after 4:00 p.m. may not be returned until the following business day.  We are closed weekends and major holidays. You have access to a nurse at all times for urgent questions. Please call the main number to the clinic [phone] and follow the prompts.  For any non-urgent questions, you may also contact your provider using MyChart. We now offer e-Visits for anyone 18 and older to request care online for non-urgent symptoms. For details visit [email or web address].   Also download the MyChart app! Go to the app store, search "MyChart", open the app, select [facility], and log in with your MyChart username and password.  Due to Covid, a mask is required upon entering the hospital/clinic. If you do not have a mask, one will be given to you upon arrival. For doctor visits, patients may have 1 support person aged 18 or older with them. For treatment visits, patients cannot have anyone with them due to current Covid guidelines and our immunocompromised population.  

## 2021-12-31 NOTE — Progress Notes (Signed)
Roger Hansen, [address] [address]   CLINIC:  Medical Oncology/Hematology  PCP:  Curlene Labrum, MD [place] [address] [phone]   REASON FOR VISIT:  Follow-up for anemia of CKD and iron deficiency   PRIOR THERAPY: None   CURRENT THERAPY: Retacrit injections every 2 weeks; intermittent parenteral iron therapy; oral iron supplement   INTERVAL HISTORY:  Roger Hansen 76 y.o. male returns for routine follow-up of CKD and functional iron deficiency.  He was last seen by Tarri Abernethy PA-C on 11/04/2021.  At today's visit, he reports feeling fairly well.  No recent hospitalizations, surgeries, or changes in baseline health status.  He is tolerating his Retacrit injections well.  His blood pressure has been stable.  He has some chronic bilateral peripheral edema, right slightly greater than left, but denies any acute changes.  No acute symptoms of DVT or PE.  He reports dark stools from iron pills, but denies any frank hematochezia, melena, epistaxis, or hematemesis.  His energy has been good at about 75%, improved from previous.  He does continue to have some chronic headaches, restless legs, and ice cravings.  He does have intermittent chest pain and palpitations associated with anxiety and PTSD, but this is improved after being started on blood pressure medication by cardiologist.  He denies any dyspnea on exertion, lightheadedness, or syncope.  He has 75% energy and 100% appetite. He endorses that he is maintaining a stable weight.   REVIEW OF SYSTEMS:  Review of Systems  Constitutional:  Positive for fatigue (Energy 75%, improved). Negative for appetite change, chills, diaphoresis, fever and unexpected weight change.  HENT:   Negative for lump/mass and nosebleeds.   Eyes:  Negative for eye problems.  Respiratory:  Negative for cough, hemoptysis and shortness of breath.   Cardiovascular:  Positive for chest pain (None today).  Negative for leg swelling and palpitations.  Gastrointestinal:  Positive for constipation. Negative for abdominal pain, blood in stool, diarrhea, nausea and vomiting.  Genitourinary:  Negative for hematuria.   Skin: Negative.   Neurological:  Positive for headaches and numbness. Negative for dizziness and light-headedness.  Hematological:  Does not bruise/bleed easily.     PAST MEDICAL/SURGICAL HISTORY:  Past Medical History:  Diagnosis Date   Alcohol abuse 11/24/2016   Anemia 10/11/2013   Cardiomyopathy ([place])    a. EF 50% by echo in 2019 with NST showing no reversible ischemia and thought to be alcohol-induced   Chronic renal disease, stage 3, moderately decreased glomerular filtration rate (GFR) between 30-59 mL/min/1.73 square meter (HCC) 12/02/2016   Colon polyps    GERD (gastroesophageal reflux disease)    High cholesterol    Hypertension    Sinus infection    Past Surgical History:  Procedure Laterality Date   BIOPSY  06/03/2019   Procedure: BIOPSY;  Surgeon: Rogene Houston, MD;  [facility];  Service: Endoscopy;;  esophagus   COLONOSCOPY N/A 10/26/2013   Procedure: COLONOSCOPY;  Surgeon: Rogene Houston, MD;  [facility];  Service: Endoscopy;  Laterality: N/A;  325-moved to 1230 Ann to notify pt   Colonoscopy with polypectomy     COLONOSCOPY WITH PROPOFOL N/A 06/03/2019   Procedure: COLONOSCOPY WITH PROPOFOL;  Surgeon: Rogene Houston, MD;  [facility];  Service: Endoscopy;  Laterality: N/A;   ESOPHAGOGASTRODUODENOSCOPY (EGD) WITH PROPOFOL N/A 06/03/2019   Procedure: ESOPHAGOGASTRODUODENOSCOPY (EGD) WITH PROPOFOL;  Surgeon: Rogene Houston, MD;  [facility];  Service: Endoscopy;  Laterality: N/A;   POLYPECTOMY  06/03/2019   Procedure: POLYPECTOMY;  Surgeon: Rogene Houston, MD;  [facility];  Service: Endoscopy;;  colon     SOCIAL HISTORY:  Social History   Socioeconomic History   Marital status: Married     Spouse name: Not on file   Number of children: Not on file   Years of education: Not on file   Highest education level: Not on file  Occupational History   Not on file  Tobacco Use   Smoking status: Former    Packs/day: 0.50    Years: 20.00    Pack years: 10.00    Types: Cigarettes    Quit date: 12/27/1971    Years since quitting: 50.0   Smokeless tobacco: Never  Vaping Use   Vaping Use: Never used  Substance and Sexual Activity   Alcohol use: Yes    Comment: 1 pint of etoh a day ( not recently)   Drug use: No   Sexual activity: Not on file  Other Topics Concern   Not on file  Social History Narrative   Not on file   Social Determinants of Health   Financial Resource Strain: Not on file  Food Insecurity: Not on file  Transportation Needs: Not on file  Physical Activity: Not on file  Stress: Not on file  Social Connections: Not on file  Intimate Partner Violence: Not on file    FAMILY HISTORY:  Family History  Problem Relation Age of Onset   Hypertension Mother    Colon cancer Neg Hx     CURRENT MEDICATIONS:  Outpatient Encounter Medications as of 01/01/2022  Medication Sig Note   ALPRAZolam (XANAX) 1 MG tablet     aspirin 81 MG tablet Take 1 tablet (81 mg total) by mouth daily.    atorvastatin (LIPITOR) 10 MG tablet Take 10 mg by mouth daily.    buPROPion (WELLBUTRIN SR) 150 MG 12 hr tablet Take 150 mg by mouth daily.     chlorhexidine (PERIDEX) 0.12 % solution     cholecalciferol (VITAMIN D) 1000 UNITS tablet Take 1,000 Units by mouth 2 (two) times daily.    donepezil (ARICEPT) 10 MG tablet     Eszopiclone 3 MG TABS Take 3 mg by mouth at bedtime.     Ferrous Sulfate (IRON) 325 (65 Fe) MG TABS TAKE ONE TABLET BY MOUTH DAILY WITH BREAKFAST. (Patient taking differently: 1 tablet 2 (two) times a day.)    folic acid (FOLVITE) 1 MG tablet Take 1 mg by mouth daily.    furosemide (LASIX) 20 MG tablet Take 20 mg by mouth daily.     gabapentin (NEURONTIN) 300 MG  capsule Take 300 mg by mouth 3 (three) times daily as needed. 10/21/2016: Received from: External Pharmacy   GNP VITAMIN B-1 100 MG tablet Take 100 mg by mouth daily.     losartan (COZAAR) 50 MG tablet Take 50 mg by mouth daily.    metoprolol succinate (TOPROL XL) 25 MG 24 hr tablet Take 1 tablet (25 mg total) by mouth daily.    nitroGLYCERIN (NITROSTAT) 0.4 MG SL tablet Place 1 tablet (0.4 mg total) under the tongue every 5 (five) minutes as needed for chest pain.    omeprazole (PRILOSEC) 20 MG capsule Take 20 mg by mouth daily as needed (Acid Reflux).    potassium chloride SA (K-DUR) 20 MEQ tablet Take 20 mEq by mouth daily.  QUEtiapine (SEROQUEL) 100 MG tablet     sodium polystyrene (KAYEXALATE) 15 GM/60ML suspension Take 60 mLs (15 g total) by mouth as directed. Take 15 g (60 mL) at noon today.  Take 15 g (60 mL) at 4 PM today.  We will call you tomorrow after labs to let you know if you need to take any additional doses.    tamsulosin (FLOMAX) 0.4 MG CAPS capsule     traZODone (DESYREL) 100 MG tablet Take 100 mg by mouth at bedtime.    vitamin B-12 (CYANOCOBALAMIN) 1000 MCG tablet Take 1,000 mcg by mouth daily.    ziprasidone (GEODON) 40 MG capsule Take 40 mg by mouth 2 (two) times daily with a meal.     No facility-administered encounter medications on file as of 01/01/2022.    ALLERGIES:  Allergies  Allergen Reactions   Horseradish [Armoracia Rusticana Ext (Horseradish)] Anaphylaxis     PHYSICAL EXAM:  ECOG PERFORMANCE STATUS: 1 - Symptomatic but completely ambulatory  There were no vitals filed for this visit. There were no vitals filed for this visit. Physical Exam Constitutional:      Appearance: Normal appearance. He is obese.  HENT:     Head: Normocephalic and atraumatic.     Mouth/Throat:     Mouth: Mucous membranes are moist.  Eyes:     Extraocular Movements: Extraocular movements intact.     Pupils: Pupils are equal, round, and reactive to light.   Cardiovascular:     Rate and Rhythm: Normal rate and regular rhythm.     Pulses: Normal pulses.     Heart sounds: Normal heart sounds.  Pulmonary:     Effort: Pulmonary effort is normal.     Breath sounds: Normal breath sounds.  Abdominal:     General: Bowel sounds are normal.     Palpations: Abdomen is soft.     Tenderness: There is no abdominal tenderness.  Musculoskeletal:        General: No swelling.     Right lower leg: No edema.     Left lower leg: No edema.  Lymphadenopathy:     Cervical: No cervical adenopathy.  Skin:    General: Skin is warm and dry.  Neurological:     General: No focal deficit present.     Mental Status: He is alert and oriented to person, place, and time.  Psychiatric:        Mood and Affect: Mood normal.        Behavior: Behavior normal.     LABORATORY DATA:  I have reviewed the labs as listed.  CBC    Component Value Date/Time   WBC 5.3 12/18/2021 0926   RBC 3.80 (L) 12/18/2021 0926   HGB 10.5 (L) 12/18/2021 0926   HCT 33.9 (L) 12/18/2021 0926   HCT 27.9 (L) 08/13/2016 0944   PLT 193 12/18/2021 0926   MCV 89.2 12/18/2021 0926   MCH 27.6 12/18/2021 0926   MCHC 31.0 12/18/2021 0926   RDW 16.4 (H) 12/18/2021 0926   LYMPHSABS 1.4 11/04/2021 0812   MONOABS 0.4 11/04/2021 0812   EOSABS 0.3 11/04/2021 0812   BASOSABS 0.0 11/04/2021 0812   CMP Latest Ref Rng & Units 11/05/2021 11/04/2021 09/20/2021  Glucose 70 - 99 mg/dL 93 98 98  BUN 8 - 23 mg/dL '19 20 12  ' Creatinine 0.61 - 1.24 mg/dL 1.06 1.08 1.75(H)  Sodium 135 - 145 mmol/L 135 137 136  Potassium 3.5 - 5.1 mmol/L 5.6(H) 6.4(HH) 3.3(L)  Chloride 98 -  111 mmol/L 105 108 95(L)  CO2 22 - 32 mmol/L 24 23 32  Calcium 8.9 - 10.3 mg/dL 9.1 9.4 6.8(L)  Total Protein 6.5 - 8.1 g/dL 7.2 7.3 6.2(L)  Total Bilirubin 0.3 - 1.2 mg/dL 0.6 0.7 0.9  Alkaline Phos 38 - 126 U/L 58 61 73  AST 15 - 41 U/L 14(L) 16 68(H)  ALT 0 - 44 U/L '10 10 24    ' DIAGNOSTIC IMAGING:  I have independently reviewed  the relevant imaging and discussed with the patient.  ASSESSMENT & PLAN: 1.   Normocytic anemia - His anemia dates back to at least 2014 with a negative peripheral work-up, negative bone marrow aspiration and biopsy and cytogenetics on 10/16/2016 in the setting of alcohol abuse. The bone marrow showed slightly hypercellular marrow with trilineage hematopoiesis. Chromosome analysis was normal. - MGUS/myeloma panel (09/20/2021) is negative (no M spike on SPEP, immunofixation negative for monoclonal protein).  Elevated kappa (74.9) and lambda (54.1) light chains in the setting of chronic kidney disease, with normal light chain ratio 1.38. - He has been receiving intermittent Epogen and parenteral iron therapy since June 2020. - He is a Jehovah witness and does not receive blood products. - Last colonoscopy and EGD on 06/03/2019: Mild esophagitis and duodenitis on EGD, polyps and external hemorrhoids on colonoscopy - He is tolerating Epogen 40,000 units every 2 weeks  - He continues to take iron tablets 325 mg once daily.   - Due to worsening anemia and functional iron deficiency, patient was given 200 mg IV Venofer x2 on 10/14/21 and 10/25/2021, in coordination with his Retacrit.  Subsequent CBC showed significant improvement after coordination of IV iron and Retacrit. - He reports dark bowel movements, possibly from iron pill.  He denies any bright red blood per rectum, hematemesis, or epistaxis.   - Labs today (01/01/2022): Hgb 10.2/MCV 90.6, ferritin 336, iron saturation 27% - Differential diagnosis includes anemia related to mild CKD, functional iron deficiency, and anemia of chronic disease.  Patient may also have some occult GI blood loss. - Unable to exclude early MDS, will consider a repeat bone marrow biopsy in the future if there are any significant deviations from baseline - History of previous heavy alcohol abuse may be a contributing factor to anemia given its bone marrow toxicity - PLAN:  Continue Retacrit 40,000 units every other week - No current indication for IV iron - Repeat labs (CBC, CMP, iron/TIBC, ferritin) and RTC in 12 weeks   2.  CKD stage II with intermittent AKI - Previous labs show creatinine up to 2.26 - Most recent creatinine 1.03 (01/01/2022) with GFR > 60 - PLAN: Continue follow-up with PCP for chronic kidney disease, may need referral to nephrology in the future, at PCP discretion.  Avoid NSAIDs and maintain adequate hydration.   3. Thrombocytopenia, mild - RESOLVED - Mild thrombocytopenia noted on recent CBC (09/20/2021) with platelets 129 - Has resolved, with most recent CBC (01/01/2022) showing platelets 190  4.  B12 deficiency -Labs from 10/07/2021 showed normal B12 433, but elevated methylmalonic acid at 470, signifying mild B12 deficiency and despite patient taking taking B12 daily 1,000 mcg - Patient was started on monthly B12 injections in November 2022 - PLAN: Continue monthly B12 injections - We will check B12/methylmalonic acid in 4 months (May 2023)   PLAN SUMMARY & DISPOSITION: CBC + Retacrit every 2 weeks Monthly B12 injections Labs and office visit in 12 weeks   All questions were answered. The patient knows to  call the clinic with any problems, questions or concerns.  Medical decision making: Moderate  Time spent on visit: I spent 20 minutes counseling the patient face to face. The total time spent in the appointment was 30 minutes and more than 50% was on counseling.   Harriett Rush, PA-C  01/01/2022 10:56 AM

## 2022-01-01 ENCOUNTER — Inpatient Hospital Stay (HOSPITAL_BASED_OUTPATIENT_CLINIC_OR_DEPARTMENT_OTHER): Payer: Medicare Other | Admitting: Physician Assistant

## 2022-01-01 ENCOUNTER — Inpatient Hospital Stay (HOSPITAL_COMMUNITY): Payer: Medicare Other

## 2022-01-01 ENCOUNTER — Encounter (HOSPITAL_COMMUNITY): Payer: Self-pay | Admitting: Physician Assistant

## 2022-01-01 ENCOUNTER — Other Ambulatory Visit: Payer: Self-pay

## 2022-01-01 VITALS — BP 143/64 | HR 58 | Temp 96.9°F | Resp 18 | Ht 65.0 in | Wt 204.1 lb

## 2022-01-01 DIAGNOSIS — N1831 Chronic kidney disease, stage 3a: Secondary | ICD-10-CM

## 2022-01-01 DIAGNOSIS — N182 Chronic kidney disease, stage 2 (mild): Secondary | ICD-10-CM | POA: Diagnosis not present

## 2022-01-01 DIAGNOSIS — D631 Anemia in chronic kidney disease: Secondary | ICD-10-CM | POA: Diagnosis not present

## 2022-01-01 DIAGNOSIS — D649 Anemia, unspecified: Secondary | ICD-10-CM

## 2022-01-01 DIAGNOSIS — D509 Iron deficiency anemia, unspecified: Secondary | ICD-10-CM | POA: Diagnosis not present

## 2022-01-01 DIAGNOSIS — E538 Deficiency of other specified B group vitamins: Secondary | ICD-10-CM | POA: Diagnosis not present

## 2022-01-01 DIAGNOSIS — N183 Chronic kidney disease, stage 3 unspecified: Secondary | ICD-10-CM

## 2022-01-01 LAB — COMPREHENSIVE METABOLIC PANEL
ALT: 11 U/L (ref 0–44)
AST: 14 U/L — ABNORMAL LOW (ref 15–41)
Albumin: 4 g/dL (ref 3.5–5.0)
Alkaline Phosphatase: 63 U/L (ref 38–126)
Anion gap: 7 (ref 5–15)
BUN: 18 mg/dL (ref 8–23)
CO2: 29 mmol/L (ref 22–32)
Calcium: 9.1 mg/dL (ref 8.9–10.3)
Chloride: 102 mmol/L (ref 98–111)
Creatinine, Ser: 1.03 mg/dL (ref 0.61–1.24)
GFR, Estimated: >60 mL/min (ref >60)
Glucose, Bld: 101 mg/dL — ABNORMAL HIGH (ref 70–99)
Potassium: 4.8 mmol/L (ref 3.5–5.1)
Sodium: 138 mmol/L (ref 135–145)
Total Bilirubin: 0.8 mg/dL (ref 0.3–1.2)
Total Protein: 7.2 g/dL (ref 6.5–8.1)

## 2022-01-01 LAB — CBC WITH DIFFERENTIAL/PLATELET
Abs Immature Granulocytes: 0.02 10*3/uL (ref 0.00–0.07)
Basophils Absolute: 0 10*3/uL (ref 0.0–0.1)
Basophils Relative: 1 %
Eosinophils Absolute: 0.2 10*3/uL (ref 0.0–0.5)
Eosinophils Relative: 5 %
HCT: 34.6 % — ABNORMAL LOW (ref 39.0–52.0)
Hemoglobin: 10.2 g/dL — ABNORMAL LOW (ref 13.0–17.0)
Immature Granulocytes: 0 %
Lymphocytes Relative: 34 %
Lymphs Abs: 1.7 10*3/uL (ref 0.7–4.0)
MCH: 26.7 pg (ref 26.0–34.0)
MCHC: 29.5 g/dL — ABNORMAL LOW (ref 30.0–36.0)
MCV: 90.6 fL (ref 80.0–100.0)
Monocytes Absolute: 0.5 10*3/uL (ref 0.1–1.0)
Monocytes Relative: 10 %
Neutro Abs: 2.5 10*3/uL (ref 1.7–7.7)
Neutrophils Relative %: 50 %
Platelets: 190 10*3/uL (ref 150–400)
RBC: 3.82 MIL/uL — ABNORMAL LOW (ref 4.22–5.81)
RDW: 17 % — ABNORMAL HIGH (ref 11.5–15.5)
WBC: 5 10*3/uL (ref 4.0–10.5)
nRBC: 0 % (ref 0.0–0.2)

## 2022-01-01 LAB — IRON AND TIBC
Iron: 78 ug/dL (ref 45–182)
Saturation Ratios: 27 % (ref 17.9–39.5)
TIBC: 294 ug/dL (ref 250–450)
UIBC: 216 ug/dL

## 2022-01-01 LAB — FERRITIN: Ferritin: 336 ng/mL (ref 24–336)

## 2022-01-01 MED ORDER — EPOETIN ALFA-EPBX 40000 UNIT/ML IJ SOLN
40000.0000 [IU] | Freq: Once | INTRAMUSCULAR | Status: AC
  Administered 2022-01-01: 40000 [IU] via SUBCUTANEOUS
  Filled 2022-01-01: qty 1

## 2022-01-01 MED ORDER — CYANOCOBALAMIN 1000 MCG/ML IJ SOLN
1000.0000 ug | Freq: Once | INTRAMUSCULAR | Status: AC
  Administered 2022-01-01: 1000 ug via INTRAMUSCULAR
  Filled 2022-01-01: qty 1

## 2022-01-01 NOTE — Progress Notes (Signed)
Roger Hansen presents today for injection per the provider's orders.  Retacrit 40,000 units and B12  administration without incident; injection site WNL; see MAR for injection details.  Patient tolerated procedure well and without incident.  No questions or complaints noted at this time. Pt's hgb is 10.2 today.  Discharged from clinic ambulatory in stable condition. Alert and oriented x 3. F/U with [facility] as scheduled.

## 2022-01-01 NOTE — Patient Instructions (Signed)
[facility]  Discharge Instructions: Thank you for choosing [facility] to provide your oncology and hematology care.  If you have a lab appointment with the [facility], please come in thru the Main Entrance and check in at the main information desk.  Wear comfortable clothing and clothing appropriate for easy access to any Portacath or PICC line.   We strive to give you quality time with your provider. You may need to reschedule your appointment if you arrive late (15 or more minutes).  Arriving late affects you and other patients whose appointments are after yours.  Also, if you miss three or more appointments without notifying the office, you may be dismissed from the clinic at the providers discretion.      For prescription refill requests, have your pharmacy contact our office and allow 72 hours for refills to be completed.    Today you received Retacrit and B12 injection.     BELOW ARE SYMPTOMS THAT SHOULD BE REPORTED IMMEDIATELY: *FEVER GREATER THAN 100.4 F (38 C) OR HIGHER *CHILLS OR SWEATING *NAUSEA AND VOMITING THAT IS NOT CONTROLLED WITH YOUR NAUSEA MEDICATION *UNUSUAL SHORTNESS OF BREATH *UNUSUAL BRUISING OR BLEEDING *URINARY PROBLEMS (pain or burning when urinating, or frequent urination) *BOWEL PROBLEMS (unusual diarrhea, constipation, pain near the anus) TENDERNESS IN MOUTH AND THROAT WITH OR WITHOUT PRESENCE OF ULCERS (sore throat, sores in mouth, or a toothache) UNUSUAL RASH, SWELLING OR PAIN  UNUSUAL VAGINAL DISCHARGE OR ITCHING   Items with * indicate a potential emergency and should be followed up as soon as possible or go to the Emergency Department if any problems should occur.  Please show the CHEMOTHERAPY ALERT CARD or IMMUNOTHERAPY ALERT CARD at check-in to the Emergency Department and triage nurse.  Should you have questions after your visit or need to cancel or reschedule your appointment, please contact [facility]  [phone]  and follow the prompts.  Office hours are 8:00 a.m. to 4:30 p.m. Monday - Friday. Please note that voicemails left after 4:00 p.m. may not be returned until the following business day.  We are closed weekends and major holidays. You have access to a nurse at all times for urgent questions. Please call the main number to the clinic [phone] and follow the prompts.  For any non-urgent questions, you may also contact your provider using MyChart. We now offer e-Visits for anyone 37 and older to request care online for non-urgent symptoms. For details visit [email or web address].   Also download the MyChart app! Go to the app store, search "MyChart", open the app, select [facility], and log in with your MyChart username and password.  Due to Covid, a mask is required upon entering the hospital/clinic. If you do not have a mask, one will be given to you upon arrival. For doctor visits, patients may have 1 support person aged 85 or older with them. For treatment visits, patients cannot have anyone with them due to current Covid guidelines and our immunocompromised population.

## 2022-01-01 NOTE — Patient Instructions (Signed)
[place] at [facility] Discharge Instructions  You were seen today by Tarri Abernethy PA-C.  ANEMIA: Your blood levels look much better.  We are going to continue your Retacrit 40,000 units every other week.    B12 DEFICIENCY: Continue B12 injections once per month instead.  FOLLOW-UP APPOINTMENT: Repeat labs and follow-up visit in 12 weeks.   Thank you for choosing [facility] at [facility] to provide your oncology and hematology care.  To afford each patient quality time with our provider, please arrive at least 15 minutes before your scheduled appointment time.   If you have a lab appointment with the [facility] please come in thru the Main Entrance and check in at the main information desk.  You need to re-schedule your appointment should you arrive 10 or more minutes late.  We strive to give you quality time with our providers, and arriving late affects you and other patients whose appointments are after yours.  Also, if you no show three or more times for appointments you may be dismissed from the clinic at the providers discretion.     Again, thank you for choosing [facility].  Our hope is that these requests will decrease the amount of time that you wait before being seen by our physicians.       _____________________________________________________________  Should you have questions after your visit to [facility], please contact our office at [phone] and follow the prompts.  Our office hours are 8:00 a.m. and 4:30 p.m. Monday - Friday.  Please note that voicemails left after 4:00 p.m. may not be returned until the following business day.  We are closed weekends and major holidays.  You do have access to a nurse 24-7, just call the main number to the clinic [phone] and do not press any options, hold on the line and a nurse will answer the phone.    For prescription refill requests, have your  pharmacy contact our office and allow 72 hours.    Due to Covid, you will need to wear a mask upon entering the hospital. If you do not have a mask, a mask will be given to you at the Main Entrance upon arrival. For doctor visits, patients may have 1 support person age 104 or older with them. For treatment visits, patients can not have anyone with them due to social distancing guidelines and our immunocompromised population.

## 2022-01-08 DIAGNOSIS — Z1329 Encounter for screening for other suspected endocrine disorder: Secondary | ICD-10-CM | POA: Diagnosis not present

## 2022-01-08 DIAGNOSIS — R5383 Other fatigue: Secondary | ICD-10-CM | POA: Diagnosis not present

## 2022-01-08 DIAGNOSIS — N183 Chronic kidney disease, stage 3 unspecified: Secondary | ICD-10-CM | POA: Diagnosis not present

## 2022-01-08 DIAGNOSIS — I10 Essential (primary) hypertension: Secondary | ICD-10-CM | POA: Diagnosis not present

## 2022-01-08 DIAGNOSIS — K219 Gastro-esophageal reflux disease without esophagitis: Secondary | ICD-10-CM | POA: Diagnosis not present

## 2022-01-12 DIAGNOSIS — I130 Hypertensive heart and chronic kidney disease with heart failure and stage 1 through stage 4 chronic kidney disease, or unspecified chronic kidney disease: Secondary | ICD-10-CM | POA: Diagnosis not present

## 2022-01-12 DIAGNOSIS — E7849 Other hyperlipidemia: Secondary | ICD-10-CM | POA: Diagnosis not present

## 2022-01-12 DIAGNOSIS — I5033 Acute on chronic diastolic (congestive) heart failure: Secondary | ICD-10-CM | POA: Diagnosis not present

## 2022-01-12 DIAGNOSIS — N183 Chronic kidney disease, stage 3 unspecified: Secondary | ICD-10-CM | POA: Diagnosis not present

## 2022-01-15 ENCOUNTER — Inpatient Hospital Stay (HOSPITAL_COMMUNITY): Payer: Medicare Other

## 2022-01-16 ENCOUNTER — Other Ambulatory Visit: Payer: Self-pay

## 2022-01-16 ENCOUNTER — Inpatient Hospital Stay (HOSPITAL_COMMUNITY): Payer: Medicare Other

## 2022-01-16 ENCOUNTER — Inpatient Hospital Stay (HOSPITAL_COMMUNITY): Payer: Medicare Other | Attending: Hematology

## 2022-01-16 ENCOUNTER — Other Ambulatory Visit (HOSPITAL_COMMUNITY): Payer: Self-pay | Admitting: *Deleted

## 2022-01-16 ENCOUNTER — Encounter (HOSPITAL_COMMUNITY): Payer: Self-pay

## 2022-01-16 DIAGNOSIS — E7849 Other hyperlipidemia: Secondary | ICD-10-CM | POA: Diagnosis not present

## 2022-01-16 DIAGNOSIS — I426 Alcoholic cardiomyopathy: Secondary | ICD-10-CM | POA: Diagnosis not present

## 2022-01-16 DIAGNOSIS — N1831 Chronic kidney disease, stage 3a: Secondary | ICD-10-CM

## 2022-01-16 DIAGNOSIS — N182 Chronic kidney disease, stage 2 (mild): Secondary | ICD-10-CM | POA: Insufficient documentation

## 2022-01-16 DIAGNOSIS — E875 Hyperkalemia: Secondary | ICD-10-CM | POA: Diagnosis not present

## 2022-01-16 DIAGNOSIS — I5032 Chronic diastolic (congestive) heart failure: Secondary | ICD-10-CM | POA: Diagnosis not present

## 2022-01-16 DIAGNOSIS — E876 Hypokalemia: Secondary | ICD-10-CM | POA: Diagnosis not present

## 2022-01-16 DIAGNOSIS — E538 Deficiency of other specified B group vitamins: Secondary | ICD-10-CM | POA: Diagnosis not present

## 2022-01-16 DIAGNOSIS — I10 Essential (primary) hypertension: Secondary | ICD-10-CM | POA: Diagnosis not present

## 2022-01-16 DIAGNOSIS — D631 Anemia in chronic kidney disease: Secondary | ICD-10-CM

## 2022-01-16 DIAGNOSIS — D649 Anemia, unspecified: Secondary | ICD-10-CM

## 2022-01-16 DIAGNOSIS — D500 Iron deficiency anemia secondary to blood loss (chronic): Secondary | ICD-10-CM

## 2022-01-16 DIAGNOSIS — D6489 Other specified anemias: Secondary | ICD-10-CM | POA: Diagnosis not present

## 2022-01-16 DIAGNOSIS — D696 Thrombocytopenia, unspecified: Secondary | ICD-10-CM

## 2022-01-16 DIAGNOSIS — I700 Atherosclerosis of aorta: Secondary | ICD-10-CM | POA: Diagnosis not present

## 2022-01-16 LAB — CBC WITH DIFFERENTIAL/PLATELET
Abs Immature Granulocytes: 0.01 10*3/uL (ref 0.00–0.07)
Basophils Absolute: 0.1 10*3/uL (ref 0.0–0.1)
Basophils Relative: 1 %
Eosinophils Absolute: 0.3 10*3/uL (ref 0.0–0.5)
Eosinophils Relative: 6 %
HCT: 36.3 % — ABNORMAL LOW (ref 39.0–52.0)
Hemoglobin: 11.1 g/dL — ABNORMAL LOW (ref 13.0–17.0)
Immature Granulocytes: 0 %
Lymphocytes Relative: 37 %
Lymphs Abs: 1.6 10*3/uL (ref 0.7–4.0)
MCH: 28 pg (ref 26.0–34.0)
MCHC: 30.6 g/dL (ref 30.0–36.0)
MCV: 91.4 fL (ref 80.0–100.0)
Monocytes Absolute: 0.4 10*3/uL (ref 0.1–1.0)
Monocytes Relative: 8 %
Neutro Abs: 2.2 10*3/uL (ref 1.7–7.7)
Neutrophils Relative %: 48 %
Platelets: 238 10*3/uL (ref 150–400)
RBC: 3.97 MIL/uL — ABNORMAL LOW (ref 4.22–5.81)
RDW: 16.5 % — ABNORMAL HIGH (ref 11.5–15.5)
WBC: 4.5 10*3/uL (ref 4.0–10.5)
nRBC: 0 % (ref 0.0–0.2)

## 2022-01-16 LAB — COMPREHENSIVE METABOLIC PANEL
ALT: 10 U/L (ref 0–44)
AST: 13 U/L — ABNORMAL LOW (ref 15–41)
Albumin: 4.2 g/dL (ref 3.5–5.0)
Alkaline Phosphatase: 69 U/L (ref 38–126)
Anion gap: 6 (ref 5–15)
BUN: 20 mg/dL (ref 8–23)
CO2: 29 mmol/L (ref 22–32)
Calcium: 9.3 mg/dL (ref 8.9–10.3)
Chloride: 103 mmol/L (ref 98–111)
Creatinine, Ser: 1.03 mg/dL (ref 0.61–1.24)
GFR, Estimated: >60 mL/min (ref >60)
Glucose, Bld: 97 mg/dL (ref 70–99)
Potassium: 4.5 mmol/L (ref 3.5–5.1)
Sodium: 138 mmol/L (ref 135–145)
Total Bilirubin: 0.8 mg/dL (ref 0.3–1.2)
Total Protein: 7.7 g/dL (ref 6.5–8.1)

## 2022-01-16 LAB — IRON AND TIBC
Iron: 83 ug/dL (ref 45–182)
Saturation Ratios: 27 % (ref 17.9–39.5)
TIBC: 309 ug/dL (ref 250–450)
UIBC: 226 ug/dL

## 2022-01-16 LAB — FERRITIN: Ferritin: 313 ng/mL (ref 24–336)

## 2022-01-16 NOTE — Progress Notes (Signed)
Hgb 11.1 today.  No injection needed.  Pharmacy aware.

## 2022-01-29 ENCOUNTER — Inpatient Hospital Stay (HOSPITAL_COMMUNITY): Payer: Medicare Other

## 2022-02-11 DIAGNOSIS — I130 Hypertensive heart and chronic kidney disease with heart failure and stage 1 through stage 4 chronic kidney disease, or unspecified chronic kidney disease: Secondary | ICD-10-CM | POA: Diagnosis not present

## 2022-02-11 DIAGNOSIS — I5033 Acute on chronic diastolic (congestive) heart failure: Secondary | ICD-10-CM | POA: Diagnosis not present

## 2022-02-11 DIAGNOSIS — N183 Chronic kidney disease, stage 3 unspecified: Secondary | ICD-10-CM | POA: Diagnosis not present

## 2022-02-12 ENCOUNTER — Inpatient Hospital Stay (HOSPITAL_COMMUNITY): Payer: Medicare Other | Attending: Hematology

## 2022-02-12 ENCOUNTER — Inpatient Hospital Stay (HOSPITAL_COMMUNITY): Payer: Medicare Other

## 2022-02-12 ENCOUNTER — Other Ambulatory Visit: Payer: Self-pay

## 2022-02-12 VITALS — BP 151/78 | HR 78 | Temp 98.3°F | Resp 18

## 2022-02-12 DIAGNOSIS — N183 Chronic kidney disease, stage 3 unspecified: Secondary | ICD-10-CM

## 2022-02-12 DIAGNOSIS — N182 Chronic kidney disease, stage 2 (mild): Secondary | ICD-10-CM | POA: Insufficient documentation

## 2022-02-12 DIAGNOSIS — D631 Anemia in chronic kidney disease: Secondary | ICD-10-CM | POA: Diagnosis not present

## 2022-02-12 DIAGNOSIS — D500 Iron deficiency anemia secondary to blood loss (chronic): Secondary | ICD-10-CM

## 2022-02-12 DIAGNOSIS — D649 Anemia, unspecified: Secondary | ICD-10-CM

## 2022-02-12 DIAGNOSIS — N2889 Other specified disorders of kidney and ureter: Secondary | ICD-10-CM

## 2022-02-12 DIAGNOSIS — D696 Thrombocytopenia, unspecified: Secondary | ICD-10-CM

## 2022-02-12 DIAGNOSIS — E538 Deficiency of other specified B group vitamins: Secondary | ICD-10-CM | POA: Diagnosis not present

## 2022-02-12 LAB — CBC
HCT: 32.9 % — ABNORMAL LOW (ref 39.0–52.0)
Hemoglobin: 10.3 g/dL — ABNORMAL LOW (ref 13.0–17.0)
MCH: 28.4 pg (ref 26.0–34.0)
MCHC: 31.3 g/dL (ref 30.0–36.0)
MCV: 90.6 fL (ref 80.0–100.0)
Platelets: 232 10*3/uL (ref 150–400)
RBC: 3.63 MIL/uL — ABNORMAL LOW (ref 4.22–5.81)
RDW: 15.8 % — ABNORMAL HIGH (ref 11.5–15.5)
WBC: 4.6 10*3/uL (ref 4.0–10.5)
nRBC: 0 % (ref 0.0–0.2)

## 2022-02-12 MED ORDER — EPOETIN ALFA-EPBX 40000 UNIT/ML IJ SOLN
40000.0000 [IU] | Freq: Once | INTRAMUSCULAR | Status: AC
  Administered 2022-02-12: 40000 [IU] via SUBCUTANEOUS
  Filled 2022-02-12: qty 1

## 2022-02-12 NOTE — Progress Notes (Signed)
Roger Hansen presents today for injection per the provider's orders.  Retacrit 40,000 units administration without incident; injection site WNL; see MAR for injection details.  Patient tolerated procedure well and without incident.  No questions or complaints noted at this time. Pt's hemoglobin noted to be 10.3 today. ? ?Discharged from clinic ambulatory in stable condition. Alert and oriented x 3. F/U with [facility] as scheduled.   ?

## 2022-02-12 NOTE — Patient Instructions (Signed)
[facility]  Discharge Instructions: ?Thank you for choosing [facility] to provide your oncology and hematology care.  ?If you have a lab appointment with the [facility], please come in thru the Main Entrance and check in at the main information desk. ? ?Wear comfortable clothing and clothing appropriate for easy access to any Portacath or PICC line.  ? ?We strive to give you quality time with your provider. You may need to reschedule your appointment if you arrive late (15 or more minutes).  Arriving late affects you and other patients whose appointments are after yours.  Also, if you miss three or more appointments without notifying the office, you may be dismissed from the clinic at the provider?s discretion.    ?  ?For prescription refill requests, have your pharmacy contact our office and allow 72 hours for refills to be completed.   ? ?Today you received the Retacrit 40,000 units ? ? ?BELOW ARE SYMPTOMS THAT SHOULD BE REPORTED IMMEDIATELY: ?*FEVER GREATER THAN 100.4 F (38 ?C) OR HIGHER ?*CHILLS OR SWEATING ?*NAUSEA AND VOMITING THAT IS NOT CONTROLLED WITH YOUR NAUSEA MEDICATION ?*UNUSUAL SHORTNESS OF BREATH ?*UNUSUAL BRUISING OR BLEEDING ?*URINARY PROBLEMS (pain or burning when urinating, or frequent urination) ?*BOWEL PROBLEMS (unusual diarrhea, constipation, pain near the anus) ?TENDERNESS IN MOUTH AND THROAT WITH OR WITHOUT PRESENCE OF ULCERS (sore throat, sores in mouth, or a toothache) ?UNUSUAL RASH, SWELLING OR PAIN  ?UNUSUAL VAGINAL DISCHARGE OR ITCHING  ? ?Items with * indicate a potential emergency and should be followed up as soon as possible or go to the Emergency Department if any problems should occur. ? ?Please show the CHEMOTHERAPY ALERT CARD or IMMUNOTHERAPY ALERT CARD at check-in to the Emergency Department and triage nurse. ? ?Should you have questions after your visit or need to cancel or reschedule your appointment, please contact [facility]  [phone]  and follow the prompts.  Office hours are 8:00 a.m. to 4:30 p.m. Monday - Friday. Please note that voicemails left after 4:00 p.m. may not be returned until the following business day.  We are closed weekends and major holidays. You have access to a nurse at all times for urgent questions. Please call the main number to the clinic [phone] and follow the prompts. ? ?For any non-urgent questions, you may also contact your provider using MyChart. We now offer e-Visits for anyone 30 and older to request care online for non-urgent symptoms. For details visit [email or web address]. ?  ?Also download the MyChart app! Go to the app store, search "MyChart", open the app, select [facility], and log in with your MyChart username and password. ? ?Due to Covid, a mask is required upon entering the hospital/clinic. If you do not have a mask, one will be given to you upon arrival. For doctor visits, patients may have 1 support person aged 98 or older with them. For treatment visits, patients cannot have anyone with them due to current Covid guidelines and our immunocompromised population.  ?

## 2022-02-26 ENCOUNTER — Other Ambulatory Visit: Payer: Self-pay

## 2022-02-26 ENCOUNTER — Inpatient Hospital Stay (HOSPITAL_COMMUNITY): Payer: Medicare Other

## 2022-02-26 ENCOUNTER — Encounter (HOSPITAL_COMMUNITY): Payer: Self-pay

## 2022-02-26 VITALS — BP 142/77 | HR 76 | Temp 97.7°F | Resp 18

## 2022-02-26 DIAGNOSIS — E538 Deficiency of other specified B group vitamins: Secondary | ICD-10-CM | POA: Diagnosis not present

## 2022-02-26 DIAGNOSIS — D696 Thrombocytopenia, unspecified: Secondary | ICD-10-CM

## 2022-02-26 DIAGNOSIS — D631 Anemia in chronic kidney disease: Secondary | ICD-10-CM | POA: Diagnosis not present

## 2022-02-26 DIAGNOSIS — N183 Chronic kidney disease, stage 3 unspecified: Secondary | ICD-10-CM

## 2022-02-26 DIAGNOSIS — D649 Anemia, unspecified: Secondary | ICD-10-CM

## 2022-02-26 DIAGNOSIS — N182 Chronic kidney disease, stage 2 (mild): Secondary | ICD-10-CM | POA: Diagnosis not present

## 2022-02-26 DIAGNOSIS — D500 Iron deficiency anemia secondary to blood loss (chronic): Secondary | ICD-10-CM

## 2022-02-26 LAB — CBC
HCT: 29.9 % — ABNORMAL LOW (ref 39.0–52.0)
Hemoglobin: 9.7 g/dL — ABNORMAL LOW (ref 13.0–17.0)
MCH: 30.3 pg (ref 26.0–34.0)
MCHC: 32.4 g/dL (ref 30.0–36.0)
MCV: 93.4 fL (ref 80.0–100.0)
Platelets: 126 10*3/uL — ABNORMAL LOW (ref 150–400)
RBC: 3.2 MIL/uL — ABNORMAL LOW (ref 4.22–5.81)
RDW: 17.6 % — ABNORMAL HIGH (ref 11.5–15.5)
WBC: 5.3 10*3/uL (ref 4.0–10.5)
nRBC: 0 % (ref 0.0–0.2)

## 2022-02-26 MED ORDER — CYANOCOBALAMIN 1000 MCG/ML IJ SOLN
1000.0000 ug | Freq: Once | INTRAMUSCULAR | Status: AC
  Administered 2022-02-26: 1000 ug via INTRAMUSCULAR
  Filled 2022-02-26: qty 1

## 2022-02-26 MED ORDER — EPOETIN ALFA-EPBX 40000 UNIT/ML IJ SOLN
40000.0000 [IU] | Freq: Once | INTRAMUSCULAR | Status: AC
  Administered 2022-02-26: 40000 [IU] via SUBCUTANEOUS
  Filled 2022-02-26: qty 1

## 2022-02-26 NOTE — Progress Notes (Signed)
Patient presents today for Retacrit injection. Hemoglobin reviewed prior to administration. VSS. Injection tolerated without incident or complaint. See MAR for details. Patient stable during and after injection.  Patient discharged in satisfactory condition with no s/s of distress noted.    

## 2022-02-26 NOTE — Patient Instructions (Signed)
[facility]  Discharge Instructions: Thank you for choosing [facility] to provide your oncology and hematology care.  If you have a lab appointment with the Cancer Center, please come in thru the Main Entrance and check in at the main information desk.  Wear comfortable clothing and clothing appropriate for easy access to any Portacath or PICC line.   We strive to give you quality time with your provider. You may need to reschedule your appointment if you arrive late (15 or more minutes).  Arriving late affects you and other patients whose appointments are after yours.  Also, if you miss three or more appointments without notifying the office, you may be dismissed from the clinic at the provider's discretion.      For prescription refill requests, have your pharmacy contact our office and allow 72 hours for refills to be completed.    Today you received the following chemotherapy and/or immunotherapy agents retacrit.     To help prevent nausea and vomiting after your treatment, we encourage you to take your nausea medication as directed.  BELOW ARE SYMPTOMS THAT SHOULD BE REPORTED IMMEDIATELY: *FEVER GREATER THAN 100.4 F (38 C) OR HIGHER *CHILLS OR SWEATING *NAUSEA AND VOMITING THAT IS NOT CONTROLLED WITH YOUR NAUSEA MEDICATION *UNUSUAL SHORTNESS OF BREATH *UNUSUAL BRUISING OR BLEEDING *URINARY PROBLEMS (pain or burning when urinating, or frequent urination) *BOWEL PROBLEMS (unusual diarrhea, constipation, pain near the anus) TENDERNESS IN MOUTH AND THROAT WITH OR WITHOUT PRESENCE OF ULCERS (sore throat, sores in mouth, or a toothache) UNUSUAL RASH, SWELLING OR PAIN  UNUSUAL VAGINAL DISCHARGE OR ITCHING   Items with * indicate a potential emergency and should be followed up as soon as possible or go to the Emergency Department if any problems should occur.  Please show the CHEMOTHERAPY ALERT CARD or IMMUNOTHERAPY ALERT CARD at check-in to the Emergency  Department and triage nurse.  Should you have questions after your visit or need to cancel or reschedule your appointment, please contact [facility] [phone]  and follow the prompts.  Office hours are 8:00 a.m. to 4:30 p.m. Monday - Friday. Please note that voicemails left after 4:00 p.m. may not be returned until the following business day.  We are closed weekends and major holidays. You have access to a nurse at all times for urgent questions. Please call the main number to the clinic [phone] and follow the prompts.  For any non-urgent questions, you may also contact your provider using MyChart. We now offer e-Visits for anyone 18 and older to request care online for non-urgent symptoms. For details visit [email or web address].   Also download the MyChart app! Go to the app store, search "MyChart", open the app, select [facility], and log in with your MyChart username and password.  Due to Covid, a mask is required upon entering the hospital/clinic. If you do not have a mask, one will be given to you upon arrival. For doctor visits, patients may have 1 support person aged 18 or older with them. For treatment visits, patients cannot have anyone with them due to current Covid guidelines and our immunocompromised population.  

## 2022-03-12 ENCOUNTER — Inpatient Hospital Stay (HOSPITAL_COMMUNITY): Payer: Medicare Other

## 2022-03-12 ENCOUNTER — Other Ambulatory Visit: Payer: Self-pay

## 2022-03-12 VITALS — BP 135/75 | HR 71 | Temp 98.2°F | Resp 18 | Wt 193.0 lb

## 2022-03-12 DIAGNOSIS — E538 Deficiency of other specified B group vitamins: Secondary | ICD-10-CM | POA: Diagnosis not present

## 2022-03-12 DIAGNOSIS — N2889 Other specified disorders of kidney and ureter: Secondary | ICD-10-CM

## 2022-03-12 DIAGNOSIS — D500 Iron deficiency anemia secondary to blood loss (chronic): Secondary | ICD-10-CM

## 2022-03-12 DIAGNOSIS — D631 Anemia in chronic kidney disease: Secondary | ICD-10-CM | POA: Diagnosis not present

## 2022-03-12 DIAGNOSIS — N182 Chronic kidney disease, stage 2 (mild): Secondary | ICD-10-CM | POA: Diagnosis not present

## 2022-03-12 DIAGNOSIS — N183 Chronic kidney disease, stage 3 unspecified: Secondary | ICD-10-CM

## 2022-03-12 DIAGNOSIS — D649 Anemia, unspecified: Secondary | ICD-10-CM

## 2022-03-12 DIAGNOSIS — D696 Thrombocytopenia, unspecified: Secondary | ICD-10-CM

## 2022-03-12 LAB — CBC
HCT: 30.2 % — ABNORMAL LOW (ref 39.0–52.0)
Hemoglobin: 9.8 g/dL — ABNORMAL LOW (ref 13.0–17.0)
MCH: 30.2 pg (ref 26.0–34.0)
MCHC: 32.5 g/dL (ref 30.0–36.0)
MCV: 93.2 fL (ref 80.0–100.0)
Platelets: 174 10*3/uL (ref 150–400)
RBC: 3.24 MIL/uL — ABNORMAL LOW (ref 4.22–5.81)
RDW: 18.9 % — ABNORMAL HIGH (ref 11.5–15.5)
WBC: 4 10*3/uL (ref 4.0–10.5)
nRBC: 0 % (ref 0.0–0.2)

## 2022-03-12 MED ORDER — EPOETIN ALFA-EPBX 40000 UNIT/ML IJ SOLN
40000.0000 [IU] | Freq: Once | INTRAMUSCULAR | Status: AC
  Administered 2022-03-12: 40000 [IU] via SUBCUTANEOUS
  Filled 2022-03-12: qty 1

## 2022-03-12 NOTE — Patient Instructions (Signed)
[facility]  Discharge Instructions: ?Thank you for choosing [facility] to provide your oncology and hematology care.  ?If you have a lab appointment with the [facility], please come in thru the Main Entrance and check in at the main information desk. ? ?Wear comfortable clothing and clothing appropriate for easy access to any Portacath or PICC line.  ? ?We strive to give you quality time with your provider. You may need to reschedule your appointment if you arrive late (15 or more minutes).  Arriving late affects you and other patients whose appointments are after yours.  Also, if you miss three or more appointments without notifying the office, you may be dismissed from the clinic at the provider?s discretion.    ?  ?For prescription refill requests, have your pharmacy contact our office and allow 72 hours for refills to be completed.   ? ?Today you received Retacrit injection ?  ? ?BELOW ARE SYMPTOMS THAT SHOULD BE REPORTED IMMEDIATELY: ?*FEVER GREATER THAN 100.4 F (38 ?C) OR HIGHER ?*CHILLS OR SWEATING ?*NAUSEA AND VOMITING THAT IS NOT CONTROLLED WITH YOUR NAUSEA MEDICATION ?*UNUSUAL SHORTNESS OF BREATH ?*UNUSUAL BRUISING OR BLEEDING ?*URINARY PROBLEMS (pain or burning when urinating, or frequent urination) ?*BOWEL PROBLEMS (unusual diarrhea, constipation, pain near the anus) ?TENDERNESS IN MOUTH AND THROAT WITH OR WITHOUT PRESENCE OF ULCERS (sore throat, sores in mouth, or a toothache) ?UNUSUAL RASH, SWELLING OR PAIN  ?UNUSUAL VAGINAL DISCHARGE OR ITCHING  ? ?Items with * indicate a potential emergency and should be followed up as soon as possible or go to the Emergency Department if any problems should occur. ? ?Please show the CHEMOTHERAPY ALERT CARD or IMMUNOTHERAPY ALERT CARD at check-in to the Emergency Department and triage nurse. ? ?Should you have questions after your visit or need to cancel or reschedule your appointment, please contact [facility]  [phone]  and follow the prompts.  Office hours are 8:00 a.m. to 4:30 p.m. Monday - Friday. Please note that voicemails left after 4:00 p.m. may not be returned until the following business day.  We are closed weekends and major holidays. You have access to a nurse at all times for urgent questions. Please call the main number to the clinic [phone] and follow the prompts. ? ?For any non-urgent questions, you may also contact your provider using MyChart. We now offer e-Visits for anyone 14 and older to request care online for non-urgent symptoms. For details visit [email or web address]. ?  ?Also download the MyChart app! Go to the app store, search "MyChart", open the app, select [facility], and log in with your MyChart username and password. ? ?Due to Covid, a mask is required upon entering the hospital/clinic. If you do not have a mask, one will be given to you upon arrival. For doctor visits, patients may have 1 support person aged 84 or older with them. For treatment visits, patients cannot have anyone with them due to current Covid guidelines and our immunocompromised population.  ?

## 2022-03-12 NOTE — Progress Notes (Signed)
JAQUALIN SERPA presents today for injection per the provider's orders.  Retacrit 40,000 units administration without incident; injection site WNL; see MAR for injection details.  Patient tolerated procedure well and without incident.  No questions or complaints noted at this time. Pt's hemoglobin noted to be 9.8 today. ? ?Discharged from clinic ambulatory in stable condition. Alert and oriented x 3. F/U with [facility] as scheduled.   ?

## 2022-03-25 NOTE — Progress Notes (Deleted)
NO SHOW

## 2022-03-26 ENCOUNTER — Inpatient Hospital Stay (HOSPITAL_COMMUNITY): Payer: Medicare Other

## 2022-03-26 ENCOUNTER — Other Ambulatory Visit (HOSPITAL_COMMUNITY): Payer: Self-pay

## 2022-03-26 ENCOUNTER — Inpatient Hospital Stay (HOSPITAL_COMMUNITY): Payer: Medicare Other | Attending: Hematology

## 2022-03-26 ENCOUNTER — Ambulatory Visit (HOSPITAL_COMMUNITY): Payer: Medicare Other | Admitting: Physician Assistant

## 2022-03-26 DIAGNOSIS — D696 Thrombocytopenia, unspecified: Secondary | ICD-10-CM

## 2022-03-26 DIAGNOSIS — D631 Anemia in chronic kidney disease: Secondary | ICD-10-CM | POA: Insufficient documentation

## 2022-03-26 DIAGNOSIS — E538 Deficiency of other specified B group vitamins: Secondary | ICD-10-CM | POA: Insufficient documentation

## 2022-03-26 DIAGNOSIS — N182 Chronic kidney disease, stage 2 (mild): Secondary | ICD-10-CM | POA: Diagnosis not present

## 2022-03-26 DIAGNOSIS — D500 Iron deficiency anemia secondary to blood loss (chronic): Secondary | ICD-10-CM

## 2022-03-26 LAB — CBC
HCT: 30.4 % — ABNORMAL LOW (ref 39.0–52.0)
Hemoglobin: 9.8 g/dL — ABNORMAL LOW (ref 13.0–17.0)
MCH: 30.2 pg (ref 26.0–34.0)
MCHC: 32.2 g/dL (ref 30.0–36.0)
MCV: 93.8 fL (ref 80.0–100.0)
Platelets: 197 10*3/uL (ref 150–400)
RBC: 3.24 MIL/uL — ABNORMAL LOW (ref 4.22–5.81)
RDW: 18.8 % — ABNORMAL HIGH (ref 11.5–15.5)
WBC: 3.9 10*3/uL — ABNORMAL LOW (ref 4.0–10.5)
nRBC: 0 % (ref 0.0–0.2)

## 2022-03-26 LAB — COMPREHENSIVE METABOLIC PANEL
ALT: 20 U/L (ref 0–44)
AST: 50 U/L — ABNORMAL HIGH (ref 15–41)
Albumin: 3.5 g/dL (ref 3.5–5.0)
Alkaline Phosphatase: 82 U/L (ref 38–126)
Anion gap: 6 (ref 5–15)
BUN: 11 mg/dL (ref 8–23)
CO2: 34 mmol/L — ABNORMAL HIGH (ref 22–32)
Calcium: 8.4 mg/dL — ABNORMAL LOW (ref 8.9–10.3)
Chloride: 98 mmol/L (ref 98–111)
Creatinine, Ser: 1.15 mg/dL (ref 0.61–1.24)
GFR, Estimated: >60 mL/min (ref >60)
Glucose, Bld: 102 mg/dL — ABNORMAL HIGH (ref 70–99)
Potassium: 2.8 mmol/L — ABNORMAL LOW (ref 3.5–5.1)
Sodium: 138 mmol/L (ref 135–145)
Total Bilirubin: 1.1 mg/dL (ref 0.3–1.2)
Total Protein: 6.6 g/dL (ref 6.5–8.1)

## 2022-03-26 LAB — FERRITIN: Ferritin: 674 ng/mL — ABNORMAL HIGH (ref 24–336)

## 2022-03-26 LAB — IRON AND TIBC
Iron: 65 ug/dL (ref 45–182)
Saturation Ratios: 27 % (ref 17.9–39.5)
TIBC: 237 ug/dL — ABNORMAL LOW (ref 250–450)
UIBC: 172 ug/dL

## 2022-03-27 ENCOUNTER — Inpatient Hospital Stay (HOSPITAL_COMMUNITY): Payer: Medicare Other

## 2022-03-27 VITALS — BP 112/56 | HR 73 | Temp 99.0°F | Resp 18

## 2022-03-27 DIAGNOSIS — N182 Chronic kidney disease, stage 2 (mild): Secondary | ICD-10-CM | POA: Diagnosis not present

## 2022-03-27 DIAGNOSIS — D649 Anemia, unspecified: Secondary | ICD-10-CM

## 2022-03-27 DIAGNOSIS — E538 Deficiency of other specified B group vitamins: Secondary | ICD-10-CM | POA: Diagnosis not present

## 2022-03-27 DIAGNOSIS — D631 Anemia in chronic kidney disease: Secondary | ICD-10-CM | POA: Diagnosis not present

## 2022-03-27 DIAGNOSIS — N183 Chronic kidney disease, stage 3 unspecified: Secondary | ICD-10-CM

## 2022-03-27 MED ORDER — EPOETIN ALFA-EPBX 40000 UNIT/ML IJ SOLN
40000.0000 [IU] | Freq: Once | INTRAMUSCULAR | Status: AC
  Administered 2022-03-27: 40000 [IU] via SUBCUTANEOUS
  Filled 2022-03-27: qty 1

## 2022-03-27 MED ORDER — CYANOCOBALAMIN 1000 MCG/ML IJ SOLN
1000.0000 ug | Freq: Once | INTRAMUSCULAR | Status: AC
  Administered 2022-03-27: 1000 ug via INTRAMUSCULAR
  Filled 2022-03-27: qty 1

## 2022-03-27 NOTE — Patient Instructions (Signed)
Roger Hansen  Discharge Instructions: ?Thank you for choosing [facility] to provide your oncology and hematology care.  ?If you have a lab appointment with the [facility], please come in thru the Main Entrance and check in at the main information desk. ? ?Wear comfortable clothing and clothing appropriate for easy access to any Portacath or PICC line.  ? ?We strive to give you quality time with your provider. You may need to reschedule your appointment if you arrive late (15 or more minutes).  Arriving late affects you and other patients whose appointments are after yours.  Also, if you miss three or more appointments without notifying the office, you may be dismissed from the clinic at the provider?s discretion.    ?  ?For prescription refill requests, have your pharmacy contact our office and allow 72 hours for refills to be completed.   ? ?Today you received the following chemotherapy and/or immunotherapy agents B12 and Retacrit    ?  ?To help prevent nausea and vomiting after your treatment, we encourage you to take your nausea medication as directed. ? ?BELOW ARE SYMPTOMS THAT SHOULD BE REPORTED IMMEDIATELY: ?*FEVER GREATER THAN 100.4 F (38 ?C) OR HIGHER ?*CHILLS OR SWEATING ?*NAUSEA AND VOMITING THAT IS NOT CONTROLLED WITH YOUR NAUSEA MEDICATION ?*UNUSUAL SHORTNESS OF BREATH ?*UNUSUAL BRUISING OR BLEEDING ?*URINARY PROBLEMS (pain or burning when urinating, or frequent urination) ?*BOWEL PROBLEMS (unusual diarrhea, constipation, pain near the anus) ?TENDERNESS IN MOUTH AND THROAT WITH OR WITHOUT PRESENCE OF ULCERS (sore throat, sores in mouth, or a toothache) ?UNUSUAL RASH, SWELLING OR PAIN  ?UNUSUAL VAGINAL DISCHARGE OR ITCHING  ? ?Items with * indicate a potential emergency and should be followed up as soon as possible or go to the Emergency Department if any problems should occur. ? ?Please show the CHEMOTHERAPY ALERT CARD or IMMUNOTHERAPY ALERT CARD at check-in to the Emergency  Department and triage nurse. ? ?Should you have questions after your visit or need to cancel or reschedule your appointment, please contact [facility] [phone]  and follow the prompts.  Office hours are 8:00 a.m. to 4:30 p.m. Monday - Friday. Please note that voicemails left after 4:00 p.m. may not be returned until the following business day.  We are closed weekends and major holidays. You have access to a nurse at all times for urgent questions. Please call the main number to the clinic [phone] and follow the prompts. ? ?For any non-urgent questions, you may also contact your provider using MyChart. We now offer e-Visits for anyone 23 and older to request care online for non-urgent symptoms. For details visit [email or web address]. ?  ?Also download the MyChart app! Go to the app store, search "MyChart", open the app, select [facility], and log in with your MyChart username and password. ? ?Due to Covid, a mask is required upon entering the hospital/clinic. If you do not have a mask, one will be given to you upon arrival. For doctor visits, patients may have 1 support person aged 63 or older with them. For treatment visits, patients cannot have anyone with them due to current Covid guidelines and our immunocompromised population.  ?

## 2022-03-27 NOTE — Progress Notes (Signed)
Patient presents today for Retacrit and B12 injections per providers order.  Hgb on 03/26/22 was noted to be 9.8.  Vital signs within parameters for treatment. ? ?Stable during administrations without incident; injection sites WNL; see MAR for injection details.  Patient tolerated procedure well and without incident.  No questions or complaints noted at this time. Discharge from clinic ambulatory in stable condition.  Alert and oriented X 3.  Follow up with [facility] as scheduled.  ?

## 2022-04-16 DIAGNOSIS — E782 Mixed hyperlipidemia: Secondary | ICD-10-CM | POA: Diagnosis not present

## 2022-04-16 DIAGNOSIS — D649 Anemia, unspecified: Secondary | ICD-10-CM | POA: Diagnosis not present

## 2022-04-16 DIAGNOSIS — E875 Hyperkalemia: Secondary | ICD-10-CM | POA: Diagnosis not present

## 2022-04-16 DIAGNOSIS — Z1329 Encounter for screening for other suspected endocrine disorder: Secondary | ICD-10-CM | POA: Diagnosis not present

## 2022-04-16 DIAGNOSIS — I10 Essential (primary) hypertension: Secondary | ICD-10-CM | POA: Diagnosis not present

## 2022-04-16 DIAGNOSIS — E871 Hypo-osmolality and hyponatremia: Secondary | ICD-10-CM | POA: Diagnosis not present

## 2022-04-16 DIAGNOSIS — R5383 Other fatigue: Secondary | ICD-10-CM | POA: Diagnosis not present

## 2022-04-16 DIAGNOSIS — E7849 Other hyperlipidemia: Secondary | ICD-10-CM | POA: Diagnosis not present

## 2022-04-22 DIAGNOSIS — D6489 Other specified anemias: Secondary | ICD-10-CM | POA: Diagnosis not present

## 2022-04-22 DIAGNOSIS — I10 Essential (primary) hypertension: Secondary | ICD-10-CM | POA: Diagnosis not present

## 2022-04-22 DIAGNOSIS — I700 Atherosclerosis of aorta: Secondary | ICD-10-CM | POA: Diagnosis not present

## 2022-04-22 DIAGNOSIS — E876 Hypokalemia: Secondary | ICD-10-CM | POA: Diagnosis not present

## 2022-04-22 DIAGNOSIS — I426 Alcoholic cardiomyopathy: Secondary | ICD-10-CM | POA: Diagnosis not present

## 2022-04-22 DIAGNOSIS — I5032 Chronic diastolic (congestive) heart failure: Secondary | ICD-10-CM | POA: Diagnosis not present

## 2022-04-22 DIAGNOSIS — M19071 Primary osteoarthritis, right ankle and foot: Secondary | ICD-10-CM | POA: Diagnosis not present

## 2022-04-22 DIAGNOSIS — E875 Hyperkalemia: Secondary | ICD-10-CM | POA: Diagnosis not present

## 2022-04-22 NOTE — Progress Notes (Signed)
? ?[facility] ?[address] ?[place], [place] [address] ? ? ?CLINIC:  ?Medical Oncology/Hematology ? ?PCP:  ?Curlene Labrum, MD ?[address] ?[place] [address] ?[phone] ? ? ?REASON FOR VISIT:  ?Follow-up for anemia of CKD and iron deficiency ?  ?PRIOR THERAPY: None ?  ?CURRENT THERAPY: Retacrit injections every 2 weeks; intermittent parenteral iron therapy; oral iron supplement ? ?INTERVAL HISTORY:  ?Roger Hansen 76 y.o. male returns for routine follow-up of his anemia of CKD and functional iron deficiency.  He was last seen by Tarri Abernethy PA-C on 01/01/2022. ? ?At today's visit, he reports feeling fair.  No recent hospitalizations, surgeries, or changes in baseline health status. ? ?He is tolerating his Retacrit injections well.  His blood pressure has been stable.  He has some chronic bilateral peripheral edema that "comes and goes," right slightly greater than left, but denies any acute changes.  No acute symptoms of DVT or PE. ?  ?He reports dark stools from iron pills, but denies any frank hematochezia, melena, epistaxis, or hematemesis.  His energy has been fairly good at about 70%. ?He continues to have occasional headaches, chronic restless legs, and chronic ice pica. ?He does have intermittent chest pain and palpitations associated with anxiety and PTSD, but this is improved after being started on blood pressure medication by cardiologist. ?He has occasional shortness of breath on exertion, which he reports is "normal for him." ?Overall, he has not noticed any worsening in his symptoms or overall health condition. ? ?He has 50% energy and 70% appetite. He endorses that he is maintaining a stable weight. ? ? ?REVIEW OF SYSTEMS:  ?Review of Systems  ?Constitutional:  Negative for appetite change, chills, diaphoresis, fatigue, fever and unexpected weight change.  ?HENT:   Positive for trouble swallowing. Negative for lump/mass and nosebleeds.   ?Eyes:  Negative for eye problems.   ?Respiratory:  Positive for shortness of breath. Negative for cough and hemoptysis.   ?Cardiovascular:  Positive for chest pain (none today). Negative for leg swelling and palpitations.  ?Gastrointestinal:  Positive for constipation and nausea. Negative for abdominal pain, blood in stool, diarrhea and vomiting.  ?Genitourinary:  Negative for hematuria.   ?Musculoskeletal:  Positive for arthralgias and back pain.  ?Skin: Negative.   ?Neurological:  Positive for dizziness, headaches and numbness. Negative for light-headedness.  ?Hematological:  Does not bruise/bleed easily.  ?Psychiatric/Behavioral:  Positive for depression and sleep disturbance. The patient is nervous/anxious.    ? ? ?PAST MEDICAL/SURGICAL HISTORY:  ?Past Medical History:  ?Diagnosis Date  ? Alcohol abuse 11/24/2016  ? Anemia 10/11/2013  ? Cardiomyopathy ([place])   ? a. EF 50% by echo in 2019 with NST showing no reversible ischemia and thought to be alcohol-induced  ? Chronic renal disease, stage 3, moderately decreased glomerular filtration rate (GFR) between 30-59 mL/min/1.73 square meter ([place]) 12/02/2016  ? Colon polyps   ? GERD (gastroesophageal reflux disease)   ? High cholesterol   ? Hypertension   ? Sinus infection   ? ?Past Surgical History:  ?Procedure Laterality Date  ? BIOPSY  06/03/2019  ? Procedure: BIOPSY;  Surgeon: Rogene Houston, MD;  [facility];  Service: Endoscopy;;  esophagus  ? COLONOSCOPY N/A 10/26/2013  ? Procedure: COLONOSCOPY;  Surgeon: Rogene Houston, MD;  [facility]: [facility];  Service: Endoscopy;  Laterality: N/A;  325-moved to 1230 Ann to notify pt  ? Colonoscopy with polypectomy    ? COLONOSCOPY WITH PROPOFOL N/A 06/03/2019  ?  Procedure: COLONOSCOPY WITH PROPOFOL;  Surgeon: Rogene Houston, MD;  [facility]: [facility];  Service: Endoscopy;  Laterality: N/A;  ? ESOPHAGOGASTRODUODENOSCOPY (EGD) WITH PROPOFOL N/A 06/03/2019  ? Procedure: ESOPHAGOGASTRODUODENOSCOPY (EGD) WITH PROPOFOL;  Surgeon: Rogene Houston, MD;  [facility]: [facility];  Service: Endoscopy;  Laterality: N/A;  ? POLYPECTOMY  06/03/2019  ? Procedure: POLYPECTOMY;  Surgeon: Rogene Houston, MD;  [facility];  Service: Endoscopy;;  colon  ? ? ? ?SOCIAL HISTORY:  ?Social History  ? ?Socioeconomic History  ? Marital status: Married  ?  Spouse name: Not on file  ? Number of children: Not on file  ? Years of education: Not on file  ? Highest education level: Not on file  ?Occupational History  ? Not on file  ?Tobacco Use  ? Smoking status: Former  ?  Packs/day: 0.50  ?  Years: 20.00  ?  Pack years: 10.00  ?  Types: Cigarettes  ?  Quit date: 12/27/1971  ?  Years since quitting: 50.3  ? Smokeless tobacco: Never  ?Vaping Use  ? Vaping Use: Never used  ?Substance and Sexual Activity  ? Alcohol use: Yes  ?  Comment: 1 pint of etoh a day ( not recently)  ? Drug use: No  ? Sexual activity: Not on file  ?Other Topics Concern  ? Not on file  ?Social History Narrative  ? Not on file  ? ?Social Determinants of Health  ? ?Financial Resource Strain: Not on file  ?Food Insecurity: Not on file  ?Transportation Needs: Not on file  ?Physical Activity: Not on file  ?Stress: Not on file  ?Social Connections: Not on file  ?Intimate Partner Violence: Not on file  ? ? ?FAMILY HISTORY:  ?Family History  ?Problem Relation Age of Onset  ? Hypertension Mother   ? Colon cancer Neg Hx   ? ? ?CURRENT MEDICATIONS:  ?Outpatient Encounter Medications as of 04/23/2022  ?Medication Sig Note  ? ALPRAZolam (XANAX) 1 MG tablet    ? aspirin 81 MG tablet Take 1 tablet (81 mg total) by mouth daily.   ? atorvastatin (LIPITOR) 10 MG tablet Take 10 mg by mouth daily.   ? buPROPion (WELLBUTRIN SR) 150 MG 12 hr tablet Take 150 mg by mouth daily.    ? buPROPion (WELLBUTRIN XL) 150 MG 24 hr tablet Take by mouth.   ? chlorhexidine (PERIDEX) 0.12 % solution    ? cholecalciferol (VITAMIN D) 1000 UNITS tablet Take 1,000 Units by mouth 2 (two) times daily.   ? donepezil (ARICEPT) 10 MG tablet    ?  Eszopiclone 3 MG TABS Take 3 mg by mouth at bedtime.    ? Ferrous Sulfate (IRON) 325 (65 Fe) MG TABS TAKE ONE TABLET BY MOUTH DAILY WITH BREAKFAST. (Patient taking differently: 1 tablet 2 (two) times a day.)   ? folic acid (FOLVITE) 1 MG tablet Take 1 mg by mouth daily.   ? furosemide (LASIX) 20 MG tablet Take 20 mg by mouth daily.    ? gabapentin (NEURONTIN) 300 MG capsule Take 300 mg by mouth 3 (three) times daily as needed. 10/21/2016: Received from: External Pharmacy  ? GNP VITAMIN B-1 100 MG tablet Take 100 mg by mouth daily.    ? losartan (COZAAR) 50 MG tablet Take 50 mg by mouth daily.   ? metoprolol succinate (TOPROL XL) 25 MG 24 hr tablet Take 1 tablet (25 mg total) by mouth daily.   ? naproxen (NAPROSYN) 500 MG  tablet Take by mouth.   ? nitroGLYCERIN (NITROSTAT) 0.4 MG SL tablet Place 1 tablet (0.4 mg total) under the tongue every 5 (five) minutes as needed for chest pain.   ? omeprazole (PRILOSEC) 20 MG capsule Take 20 mg by mouth daily as needed (Acid Reflux).   ? omeprazole (PRILOSEC) 40 MG capsule Take by mouth.   ? potassium chloride SA (K-DUR) 20 MEQ tablet Take 20 mEq by mouth daily.    ? QUEtiapine (SEROQUEL) 100 MG tablet    ? sodium polystyrene (KAYEXALATE) 15 GM/60ML suspension Take 60 mLs (15 g total) by mouth as directed. Take 15 g (60 mL) at noon today.  Take 15 g (60 mL) at 4 PM today.  We will call you tomorrow after labs to let you know if you need to take any additional doses.   ? tamsulosin (FLOMAX) 0.4 MG CAPS capsule    ? traZODone (DESYREL) 100 MG tablet Take 100 mg by mouth at bedtime.   ? vitamin B-12 (CYANOCOBALAMIN) 1000 MCG tablet Take 1,000 mcg by mouth daily.   ? ziprasidone (GEODON) 40 MG capsule Take 40 mg by mouth 2 (two) times daily with a meal.    ? ?No facility-administered encounter medications on file as of 04/23/2022.  ? ? ?ALLERGIES:  ?Allergies  ?Allergen Reactions  ? Horseradish [Armoracia Rusticana Ext (Horseradish)] Anaphylaxis  ? ? ? ?PHYSICAL EXAM:  ?ECOG  PERFORMANCE STATUS: 1 - Symptomatic but completely ambulatory ? ?There were no vitals filed for this visit. ?There were no vitals filed for this visit. ?Physical Exam ?Constitutional:   ?   Appearance: Normal appea

## 2022-04-23 ENCOUNTER — Inpatient Hospital Stay (HOSPITAL_COMMUNITY): Payer: Medicare Other | Attending: Physician Assistant | Admitting: Physician Assistant

## 2022-04-23 ENCOUNTER — Inpatient Hospital Stay (HOSPITAL_COMMUNITY): Payer: Medicare Other

## 2022-04-23 VITALS — BP 153/86 | HR 66 | Temp 98.2°F | Resp 16 | Ht 65.0 in | Wt 190.7 lb

## 2022-04-23 DIAGNOSIS — D509 Iron deficiency anemia, unspecified: Secondary | ICD-10-CM | POA: Diagnosis not present

## 2022-04-23 DIAGNOSIS — I129 Hypertensive chronic kidney disease with stage 1 through stage 4 chronic kidney disease, or unspecified chronic kidney disease: Secondary | ICD-10-CM | POA: Diagnosis not present

## 2022-04-23 DIAGNOSIS — D696 Thrombocytopenia, unspecified: Secondary | ICD-10-CM | POA: Insufficient documentation

## 2022-04-23 DIAGNOSIS — D631 Anemia in chronic kidney disease: Secondary | ICD-10-CM | POA: Insufficient documentation

## 2022-04-23 DIAGNOSIS — N183 Chronic kidney disease, stage 3 unspecified: Secondary | ICD-10-CM

## 2022-04-23 DIAGNOSIS — D500 Iron deficiency anemia secondary to blood loss (chronic): Secondary | ICD-10-CM

## 2022-04-23 DIAGNOSIS — D649 Anemia, unspecified: Secondary | ICD-10-CM

## 2022-04-23 DIAGNOSIS — E538 Deficiency of other specified B group vitamins: Secondary | ICD-10-CM | POA: Insufficient documentation

## 2022-04-23 DIAGNOSIS — N182 Chronic kidney disease, stage 2 (mild): Secondary | ICD-10-CM | POA: Diagnosis not present

## 2022-04-23 DIAGNOSIS — E611 Iron deficiency: Secondary | ICD-10-CM | POA: Insufficient documentation

## 2022-04-23 LAB — IRON AND TIBC
Iron: 76 ug/dL (ref 45–182)
Saturation Ratios: 36 % (ref 17.9–39.5)
TIBC: 210 ug/dL — ABNORMAL LOW (ref 250–450)
UIBC: 134 ug/dL

## 2022-04-23 LAB — CBC
HCT: 29.8 % — ABNORMAL LOW (ref 39.0–52.0)
Hemoglobin: 9.2 g/dL — ABNORMAL LOW (ref 13.0–17.0)
MCH: 29 pg (ref 26.0–34.0)
MCHC: 30.9 g/dL (ref 30.0–36.0)
MCV: 94 fL (ref 80.0–100.0)
Platelets: 190 10*3/uL (ref 150–400)
RBC: 3.17 MIL/uL — ABNORMAL LOW (ref 4.22–5.81)
RDW: 17.4 % — ABNORMAL HIGH (ref 11.5–15.5)
WBC: 4 10*3/uL (ref 4.0–10.5)
nRBC: 0 % (ref 0.0–0.2)

## 2022-04-23 LAB — COMPREHENSIVE METABOLIC PANEL
ALT: 18 U/L (ref 0–44)
AST: 29 U/L (ref 15–41)
Albumin: 3.5 g/dL (ref 3.5–5.0)
Alkaline Phosphatase: 64 U/L (ref 38–126)
Anion gap: 8 (ref 5–15)
BUN: 9 mg/dL (ref 8–23)
CO2: 31 mmol/L (ref 22–32)
Calcium: 8.1 mg/dL — ABNORMAL LOW (ref 8.9–10.3)
Chloride: 101 mmol/L (ref 98–111)
Creatinine, Ser: 1 mg/dL (ref 0.61–1.24)
GFR, Estimated: >60 mL/min (ref >60)
Glucose, Bld: 93 mg/dL (ref 70–99)
Potassium: 3.3 mmol/L — ABNORMAL LOW (ref 3.5–5.1)
Sodium: 140 mmol/L (ref 135–145)
Total Bilirubin: 1 mg/dL (ref 0.3–1.2)
Total Protein: 6.6 g/dL (ref 6.5–8.1)

## 2022-04-23 LAB — FERRITIN: Ferritin: 615 ng/mL — ABNORMAL HIGH (ref 24–336)

## 2022-04-23 LAB — VITAMIN B12: Vitamin B-12: 620 pg/mL (ref 180–914)

## 2022-04-23 MED ORDER — EPOETIN ALFA-EPBX 40000 UNIT/ML IJ SOLN
40000.0000 [IU] | Freq: Once | INTRAMUSCULAR | Status: AC
  Administered 2022-04-23: 40000 [IU] via SUBCUTANEOUS
  Filled 2022-04-23: qty 1

## 2022-04-23 MED ORDER — CYANOCOBALAMIN 1000 MCG/ML IJ SOLN
1000.0000 ug | Freq: Once | INTRAMUSCULAR | Status: AC
  Administered 2022-04-23: 1000 ug via INTRAMUSCULAR
  Filled 2022-04-23: qty 1

## 2022-04-23 NOTE — Patient Instructions (Signed)
[facility]  Discharge Instructions: ?Thank you for choosing [facility] to provide your oncology and hematology care.  ?If you have a lab appointment with the [facility], please come in thru the Main Entrance and check in at the main information desk. ? ?Wear comfortable clothing and clothing appropriate for easy access to any Portacath or PICC line.  ? ?We strive to give you quality time with your provider. You may need to reschedule your appointment if you arrive late (15 or more minutes).  Arriving late affects you and other patients whose appointments are after yours.  Also, if you miss three or more appointments without notifying the office, you may be dismissed from the clinic at the provider?s discretion.    ?  ?For prescription refill requests, have your pharmacy contact our office and allow 72 hours for refills to be completed.   ? ?Vitamin B12 Injection ?What is this medication? ?Vitamin B12 (VAHY tuh min B12) prevents and treats low vitamin B12 levels in your body. It is used in people who do not get enough vitamin B12 from their diet or when their digestive tract does not absorb enough. Vitamin B12 plays an important role in maintaining the health of your nervous system and red blood cells. ?This medicine may be used for other purposes; ask your health care provider or pharmacist if you have questions. ?COMMON BRAND NAME(S): B-12 Compliance Kit, B-12 Injection Kit, Cyomin, Dodex, LA-12, Nutri-Twelve, Physicians EZ Use B-12, Primabalt ?What should I tell my care team before I take this medication? ?They need to know if you have any of these conditions: ?Kidney disease ?Leber's disease ?Megaloblastic anemia ?An unusual or allergic reaction to cyanocobalamin, cobalt, other medications, foods, dyes, or preservatives ?Pregnant or trying to get pregnant ?Breast-feeding ?How should I use this medication? ?This medication is injected into a muscle or deeply under the skin. It is  usually given in a clinic or care team's office. However, your care team may teach you how to inject yourself. Follow all instructions. ?Talk to your care team about the use of this medication in children. Special care may be needed. ?Overdosage: If you think you have taken too much of this medicine contact a poison control center or emergency room at once. ?NOTE: This medicine is only for you. Do not share this medicine with others. ?What if I miss a dose? ?If you are given your dose at a clinic or care team's office, call to reschedule your appointment. If you give your own injections, and you miss a dose, take it as soon as you can. If it is almost time for your next dose, take only that dose. Do not take double or extra doses. ?What may interact with this medication? ?Alcohol ?Colchicine ?This list may not describe all possible interactions. Give your health care provider a list of all the medicines, herbs, non-prescription drugs, or dietary supplements you use. Also tell them if you smoke, drink alcohol, or use illegal drugs. Some items may interact with your medicine. ?What should I watch for while using this medication? ?Visit your care team regularly. You may need blood work done while you are taking this medication. ?You may need to follow a special diet. Talk to your care team. Limit your alcohol intake and avoid smoking to get the best benefit. ?What side effects may I notice from receiving this medication? ?Side effects that you should report to your care team as soon as possible: ?Allergic reactions--skin rash, itching,  hives, swelling of the face, lips, tongue, or throat ?Swelling of the ankles, hands, or feet ?Trouble breathing ?Side effects that usually do not require medical attention (report to your care team if they continue or are bothersome): ?Diarrhea ?This list may not describe all possible side effects. Call your doctor for medical advice about side effects. You may report side effects to FDA  at 1-800-FDA-1088. ?Where should I keep my medication? ?Keep out of the reach of children. ?Store at room temperature between 15 and 30 degrees C (59 and 85 degrees F). Protect from light. Throw away any unused medication after the expiration date. ?NOTE: This sheet is a summary. It may not cover all possible information. If you have questions about this medicine, talk to your doctor, pharmacist, or health care provider. ?? 2023 Elsevier/Gold Standard (2021-08-13 00:00:00) ? ?  ?To help prevent nausea and vomiting after your treatment, we encourage you to take your nausea medication as directed. ? ?BELOW ARE SYMPTOMS THAT SHOULD BE REPORTED IMMEDIATELY: ?*FEVER GREATER THAN 100.4 F (38 ?C) OR HIGHER ?*CHILLS OR SWEATING ?*NAUSEA AND VOMITING THAT IS NOT CONTROLLED WITH YOUR NAUSEA MEDICATION ?*UNUSUAL SHORTNESS OF BREATH ?*UNUSUAL BRUISING OR BLEEDING ?*URINARY PROBLEMS (pain or burning when urinating, or frequent urination) ?*BOWEL PROBLEMS (unusual diarrhea, constipation, pain near the anus) ?TENDERNESS IN MOUTH AND THROAT WITH OR WITHOUT PRESENCE OF ULCERS (sore throat, sores in mouth, or a toothache) ?UNUSUAL RASH, SWELLING OR PAIN  ?UNUSUAL VAGINAL DISCHARGE OR ITCHING  ? ?Items with * indicate a potential emergency and should be followed up as soon as possible or go to the Emergency Department if any problems should occur. ? ?Please show the CHEMOTHERAPY ALERT CARD or IMMUNOTHERAPY ALERT CARD at check-in to the Emergency Department and triage nurse. ? ?Should you have questions after your visit or need to cancel or reschedule your appointment, please contact [facility] [phone]  and follow the prompts.  Office hours are 8:00 a.m. to 4:30 p.m. Monday - Friday. Please note that voicemails left after 4:00 p.m. may not be returned until the following business day.  We are closed weekends and major holidays. You have access to a nurse at all times for urgent questions. Please call the main number  to the clinic [phone] and follow the prompts. ? ?For any non-urgent questions, you may also contact your provider using MyChart. We now offer e-Visits for anyone 9 and older to request care online for non-urgent symptoms. For details visit [email or web address]. ?  ?Also download the MyChart app! Go to the app store, search "MyChart", open the app, select [facility], and log in with your MyChart username and password. ? ?Due to Covid, a mask is required upon entering the hospital/clinic. If you do not have a mask, one will be given to you upon arrival. For doctor visits, patients may have 1 support person aged 59 or older with them. For treatment visits, patients cannot have anyone with them due to current Covid guidelines and our immunocompromised population.  ?

## 2022-04-23 NOTE — Patient Instructions (Signed)
[facility] at [facility] ?Discharge Instructions ? ?You were seen today by Tarri Abernethy PA-C. ? ?ANEMIA: Your blood levels are slightly lower than at your last visit.  We are going to continue your Retacrit 40,000 units every other week, but we will watch your blood levels closely.  If your hemoglobin drops to less than 9, we will increase your Retacrit injections to once weekly.  If you continue to have worsening anemia, we may also consider scheduling you for another bone marrow biopsy in the future. ? ?B12 DEFICIENCY: Continue B12 injections once per month. ? ?FOLLOW-UP APPOINTMENT: Repeat labs and follow-up visit in 8 weeks. ? ? ?Thank you for choosing [facility] at [facility] to provide your oncology and hematology care.  To afford each patient quality time with our provider, please arrive at least 15 minutes before your scheduled appointment time.  ? ?If you have a lab appointment with the [facility] please come in thru the Main Entrance and check in at the main information desk. ? ?You need to re-schedule your appointment should you arrive 10 or more minutes late.  We strive to give you quality time with our providers, and arriving late affects you and other patients whose appointments are after yours.  Also, if you no show three or more times for appointments you may be dismissed from the clinic at the providers discretion.     ?Again, thank you for choosing [facility].  Our hope is that these requests will decrease the amount of time that you wait before being seen by our physicians.       ?_____________________________________________________________ ? ?Should you have questions after your visit to [facility], please contact our office at [phone] and follow the prompts.  Our office hours are 8:00 a.m. and 4:30 p.m. Monday - Friday.  Please note that voicemails left after 4:00 p.m. may not be returned until the  following business day.  We are closed weekends and major holidays.  You do have access to a nurse 24-7, just call the main number to the clinic [phone] and do not press any options, hold on the line and a nurse will answer the phone.   ? ?For prescription refill requests, have your pharmacy contact our office and allow 72 hours.   ? ?Due to Covid, you will need to wear a mask upon entering the hospital. If you do not have a mask, a mask will be given to you at the Main Entrance upon arrival. For doctor visits, patients may have 1 support person age 18 or older with them. For treatment visits, patients can not have anyone with them due to social distancing guidelines and our immunocompromised population.  ? ? ? ?

## 2022-04-23 NOTE — Progress Notes (Signed)
Patient presents today for Retacrit injection and Vitamin B12 injection. Hemoglobin reviewed prior to administration. VSS. Injection tolerated without incident or complaint. See MAR for details. Patient stable during and after injection.  Patient discharged in satisfactory condition with no s/s of distress noted.    

## 2022-04-29 LAB — METHYLMALONIC ACID, SERUM: Methylmalonic Acid, Quantitative: 287 nmol/L (ref 0–378)

## 2022-05-07 ENCOUNTER — Inpatient Hospital Stay (HOSPITAL_COMMUNITY): Payer: Medicare Other

## 2022-05-07 DIAGNOSIS — D500 Iron deficiency anemia secondary to blood loss (chronic): Secondary | ICD-10-CM

## 2022-05-07 DIAGNOSIS — D631 Anemia in chronic kidney disease: Secondary | ICD-10-CM | POA: Diagnosis not present

## 2022-05-07 DIAGNOSIS — N182 Chronic kidney disease, stage 2 (mild): Secondary | ICD-10-CM | POA: Diagnosis not present

## 2022-05-07 DIAGNOSIS — E538 Deficiency of other specified B group vitamins: Secondary | ICD-10-CM | POA: Diagnosis not present

## 2022-05-07 DIAGNOSIS — D509 Iron deficiency anemia, unspecified: Secondary | ICD-10-CM | POA: Diagnosis not present

## 2022-05-07 DIAGNOSIS — D696 Thrombocytopenia, unspecified: Secondary | ICD-10-CM

## 2022-05-07 DIAGNOSIS — I129 Hypertensive chronic kidney disease with stage 1 through stage 4 chronic kidney disease, or unspecified chronic kidney disease: Secondary | ICD-10-CM | POA: Diagnosis not present

## 2022-05-07 LAB — CBC
HCT: 30.4 % — ABNORMAL LOW (ref 39.0–52.0)
Hemoglobin: 10.1 g/dL — ABNORMAL LOW (ref 13.0–17.0)
MCH: 30.2 pg (ref 26.0–34.0)
MCHC: 33.2 g/dL (ref 30.0–36.0)
MCV: 91 fL (ref 80.0–100.0)
Platelets: 141 10*3/uL — ABNORMAL LOW (ref 150–400)
RBC: 3.34 MIL/uL — ABNORMAL LOW (ref 4.22–5.81)
RDW: 17.7 % — ABNORMAL HIGH (ref 11.5–15.5)
WBC: 5.4 10*3/uL (ref 4.0–10.5)
nRBC: 0 % (ref 0.0–0.2)

## 2022-05-21 ENCOUNTER — Inpatient Hospital Stay (HOSPITAL_COMMUNITY): Payer: Medicare Other | Attending: Physician Assistant

## 2022-05-21 ENCOUNTER — Inpatient Hospital Stay (HOSPITAL_COMMUNITY): Payer: Medicare Other

## 2022-05-21 DIAGNOSIS — N182 Chronic kidney disease, stage 2 (mild): Secondary | ICD-10-CM | POA: Insufficient documentation

## 2022-05-21 DIAGNOSIS — E538 Deficiency of other specified B group vitamins: Secondary | ICD-10-CM | POA: Insufficient documentation

## 2022-05-21 DIAGNOSIS — D631 Anemia in chronic kidney disease: Secondary | ICD-10-CM | POA: Insufficient documentation

## 2022-05-29 ENCOUNTER — Encounter (HOSPITAL_COMMUNITY): Payer: Self-pay

## 2022-05-29 ENCOUNTER — Inpatient Hospital Stay (HOSPITAL_COMMUNITY): Payer: Medicare Other

## 2022-05-29 VITALS — BP 148/74 | HR 69 | Temp 98.3°F | Resp 18

## 2022-05-29 DIAGNOSIS — D631 Anemia in chronic kidney disease: Secondary | ICD-10-CM | POA: Diagnosis not present

## 2022-05-29 DIAGNOSIS — D696 Thrombocytopenia, unspecified: Secondary | ICD-10-CM

## 2022-05-29 DIAGNOSIS — E538 Deficiency of other specified B group vitamins: Secondary | ICD-10-CM | POA: Diagnosis not present

## 2022-05-29 DIAGNOSIS — N183 Chronic kidney disease, stage 3 unspecified: Secondary | ICD-10-CM

## 2022-05-29 DIAGNOSIS — D500 Iron deficiency anemia secondary to blood loss (chronic): Secondary | ICD-10-CM

## 2022-05-29 DIAGNOSIS — D649 Anemia, unspecified: Secondary | ICD-10-CM

## 2022-05-29 DIAGNOSIS — N182 Chronic kidney disease, stage 2 (mild): Secondary | ICD-10-CM | POA: Diagnosis not present

## 2022-05-29 LAB — CBC
HCT: 24.5 % — ABNORMAL LOW (ref 39.0–52.0)
Hemoglobin: 8 g/dL — ABNORMAL LOW (ref 13.0–17.0)
MCH: 29.5 pg (ref 26.0–34.0)
MCHC: 32.7 g/dL (ref 30.0–36.0)
MCV: 90.4 fL (ref 80.0–100.0)
Platelets: 110 10*3/uL — ABNORMAL LOW (ref 150–400)
RBC: 2.71 MIL/uL — ABNORMAL LOW (ref 4.22–5.81)
RDW: 18.2 % — ABNORMAL HIGH (ref 11.5–15.5)
WBC: 4.8 10*3/uL (ref 4.0–10.5)
nRBC: 0 % (ref 0.0–0.2)

## 2022-05-29 MED ORDER — CYANOCOBALAMIN 1000 MCG/ML IJ SOLN
1000.0000 ug | Freq: Once | INTRAMUSCULAR | Status: AC
  Administered 2022-05-29: 1000 ug via INTRAMUSCULAR
  Filled 2022-05-29: qty 1

## 2022-05-29 MED ORDER — EPOETIN ALFA-EPBX 40000 UNIT/ML IJ SOLN
40000.0000 [IU] | Freq: Once | INTRAMUSCULAR | Status: AC
  Administered 2022-05-29: 40000 [IU] via SUBCUTANEOUS
  Filled 2022-05-29: qty 1

## 2022-05-29 NOTE — Progress Notes (Signed)
Patient tolerated  B12 injection with no complaints voiced. Site clean and dry with no bruising or swelling noted at site. See MAR for details. Band aid applied.  Patient stable during and after injection.  Patient presents today for Retacrit injection. Hemoglobin reviewed prior to administration. VSS tolerated without incident or complaint. See MAR for details. Patient stable during and after injection. Patient discharged in satisfactory condition with no s/s of distress noted. 

## 2022-05-29 NOTE — Patient Instructions (Signed)
[facility]  Discharge Instructions: Thank you for choosing [facility] to provide your oncology and hematology care.  If you have a lab appointment with the [facility], please come in thru the Main Entrance and check in at the main information desk.  Wear comfortable clothing and clothing appropriate for easy access to any Portacath or PICC line.   We strive to give you quality time with your provider. You may need to reschedule your appointment if you arrive late (15 or more minutes).  Arriving late affects you and other patients whose appointments are after yours.  Also, if you miss three or more appointments without notifying the office, you may be dismissed from the clinic at the provider's discretion.      For prescription refill requests, have your pharmacy contact our office and allow 72 hours for refills to be completed.    Today you received the following B12 and Retacrit injection, return as scheduled.  To help prevent nausea and vomiting after your treatment, we encourage you to take your nausea medication as directed.  BELOW ARE SYMPTOMS THAT SHOULD BE REPORTED IMMEDIATELY: *FEVER GREATER THAN 100.4 F (38 C) OR HIGHER *CHILLS OR SWEATING *NAUSEA AND VOMITING THAT IS NOT CONTROLLED WITH YOUR NAUSEA MEDICATION *UNUSUAL SHORTNESS OF BREATH *UNUSUAL BRUISING OR BLEEDING *URINARY PROBLEMS (pain or burning when urinating, or frequent urination) *BOWEL PROBLEMS (unusual diarrhea, constipation, pain near the anus) TENDERNESS IN MOUTH AND THROAT WITH OR WITHOUT PRESENCE OF ULCERS (sore throat, sores in mouth, or a toothache) UNUSUAL RASH, SWELLING OR PAIN  UNUSUAL VAGINAL DISCHARGE OR ITCHING   Items with * indicate a potential emergency and should be followed up as soon as possible or go to the Emergency Department if any problems should occur.  Please show the CHEMOTHERAPY ALERT CARD or IMMUNOTHERAPY ALERT CARD at check-in to the Emergency Department  and triage nurse.  Should you have questions after your visit or need to cancel or reschedule your appointment, please contact [facility] [phone]  and follow the prompts.  Office hours are 8:00 a.m. to 4:30 p.m. Monday - Friday. Please note that voicemails left after 4:00 p.m. may not be returned until the following business day.  We are closed weekends and major holidays. You have access to a nurse at all times for urgent questions. Please call the main number to the clinic [phone] and follow the prompts.  For any non-urgent questions, you may also contact your provider using MyChart. We now offer e-Visits for anyone 16 and older to request care online for non-urgent symptoms. For details visit [email or web address].   Also download the MyChart app! Go to the app store, search "MyChart", open the app, select [facility], and log in with your MyChart username and password.  Masks are optional in the cancer centers. If you would like for your care team to wear a mask while they are taking care of you, please let them know. For doctor visits, patients may have with them one support person who is at least 74 years old. At this time, visitors are not allowed in the infusion area.

## 2022-06-04 ENCOUNTER — Inpatient Hospital Stay (HOSPITAL_COMMUNITY): Payer: Medicare Other

## 2022-06-04 ENCOUNTER — Ambulatory Visit (INDEPENDENT_AMBULATORY_CARE_PROVIDER_SITE_OTHER): Payer: Medicare Other | Admitting: Internal Medicine

## 2022-06-04 ENCOUNTER — Encounter: Payer: Self-pay | Admitting: Internal Medicine

## 2022-06-04 VITALS — BP 150/74 | HR 62 | Ht 65.0 in | Wt 190.2 lb

## 2022-06-04 DIAGNOSIS — R152 Fecal urgency: Secondary | ICD-10-CM | POA: Diagnosis not present

## 2022-06-04 DIAGNOSIS — Z6832 Body mass index (BMI) 32.0-32.9, adult: Secondary | ICD-10-CM | POA: Diagnosis not present

## 2022-06-04 DIAGNOSIS — R1013 Epigastric pain: Secondary | ICD-10-CM | POA: Diagnosis not present

## 2022-06-04 DIAGNOSIS — I712 Thoracic aortic aneurysm, without rupture, unspecified: Secondary | ICD-10-CM | POA: Diagnosis not present

## 2022-06-04 DIAGNOSIS — N183 Chronic kidney disease, stage 3 unspecified: Secondary | ICD-10-CM | POA: Diagnosis not present

## 2022-06-04 DIAGNOSIS — Z1212 Encounter for screening for malignant neoplasm of rectum: Secondary | ICD-10-CM | POA: Diagnosis not present

## 2022-06-04 DIAGNOSIS — E039 Hypothyroidism, unspecified: Secondary | ICD-10-CM | POA: Diagnosis not present

## 2022-06-04 DIAGNOSIS — I5020 Unspecified systolic (congestive) heart failure: Secondary | ICD-10-CM | POA: Diagnosis not present

## 2022-06-04 DIAGNOSIS — F1010 Alcohol abuse, uncomplicated: Secondary | ICD-10-CM

## 2022-06-04 MED ORDER — METOPROLOL SUCCINATE ER 25 MG PO TB24: 25.0000 mg | ORAL_TABLET | Freq: Every day | ORAL | 3 refills | Status: DC

## 2022-06-04 NOTE — Progress Notes (Signed)
Cardiology Office Note:    Date:  06/04/2022   ID:  [name], DOB 1946/02/29, MRN [id number]  PCP:  Curlene Labrum, MD   [facility] Providers Cardiologist:  Werner Lean, MD     Referring MD: Curlene Labrum, MD   CC: Follow up  History of Present Illness:    Roger Hansen is a 76 y.o. male with a hx of HFmrEF, CKD stage IIIa, HTN, Dementia on Donepezil who presents for evaluation 10/17/21.  Since last visit and stable Echo (EF 50% Mild MR) and deferred ischemic work up because sx improved.  Patient notes that he is doing much better.   Has been relatively sedentary. Wife is doing well but is still forgetful. Still hasn't quit alcohol but has cut back a lot (NOS). There are no interval hospital/ED visit.    Chest pain has greatly improved.  Now his chest pain is only after meals.  No SOB/DOE and no PND/Orthopnea.  No weight gain or leg swelling.  No palpitations or syncope.  Has two grandkids   Ambulatory blood pressure not done.  His car broke down today and he hasn't been home; did not take his medications today  Past Medical History:  Diagnosis Date   Alcohol abuse 11/24/2016   Anemia 10/11/2013   Cardiomyopathy ([place])    a. EF 50% by echo in 2019 with NST showing no reversible ischemia and thought to be alcohol-induced   Chronic renal disease, stage 3, moderately decreased glomerular filtration rate (GFR) between 30-59 mL/min/1.73 square meter (HCC) 12/02/2016   Colon polyps    GERD (gastroesophageal reflux disease)    High cholesterol    Hypertension    Sinus infection     Past Surgical History:  Procedure Laterality Date   BIOPSY  06/03/2019   Procedure: BIOPSY;  Surgeon: Rogene Houston, MD;  [facility];  Service: Endoscopy;;  esophagus   COLONOSCOPY N/A 10/26/2013   Procedure: COLONOSCOPY;  Surgeon: Rogene Houston, MD;  [facility];  Service: Endoscopy;  Laterality: N/A;  325-moved to 1230 Ann to notify pt    Colonoscopy with polypectomy     COLONOSCOPY WITH PROPOFOL N/A 06/03/2019   Procedure: COLONOSCOPY WITH PROPOFOL;  Surgeon: Rogene Houston, MD;  [facility];  Service: Endoscopy;  Laterality: N/A;   ESOPHAGOGASTRODUODENOSCOPY (EGD) WITH PROPOFOL N/A 06/03/2019   Procedure: ESOPHAGOGASTRODUODENOSCOPY (EGD) WITH PROPOFOL;  Surgeon: Rogene Houston, MD;  [facility];  Service: Endoscopy;  Laterality: N/A;   POLYPECTOMY  06/03/2019   Procedure: POLYPECTOMY;  Surgeon: Rogene Houston, MD;  [facility];  Service: Endoscopy;;  colon    Current Medications: Current Meds  Medication Sig   ALPRAZolam (XANAX) 1 MG tablet    aspirin 81 MG tablet Take 1 tablet (81 mg total) by mouth daily.   atorvastatin (LIPITOR) 10 MG tablet Take 10 mg by mouth daily.   buPROPion (WELLBUTRIN SR) 150 MG 12 hr tablet Take 150 mg by mouth daily.    buPROPion (WELLBUTRIN XL) 150 MG 24 hr tablet Take by mouth.   chlorhexidine (PERIDEX) 0.12 % solution    cholecalciferol (VITAMIN D) 1000 UNITS tablet Take 1,000 Units by mouth 2 (two) times daily.   donepezil (ARICEPT) 10 MG tablet    Eszopiclone 3 MG TABS Take 3 mg by mouth at bedtime.    Ferrous Sulfate (IRON) 325 (65 Fe) MG TABS TAKE ONE TABLET BY MOUTH DAILY WITH BREAKFAST. (  Patient taking differently: 1 tablet 2 (two) times a day.)   folic acid (FOLVITE) 1 MG tablet Take 1 mg by mouth daily.   furosemide (LASIX) 20 MG tablet Take 20 mg by mouth daily.    gabapentin (NEURONTIN) 300 MG capsule Take 300 mg by mouth 3 (three) times daily as needed.   GNP VITAMIN B-1 100 MG tablet Take 100 mg by mouth daily.    losartan (COZAAR) 50 MG tablet Take 50 mg by mouth daily.   naproxen (NAPROSYN) 500 MG tablet Take by mouth.   nitroGLYCERIN (NITROSTAT) 0.4 MG SL tablet Place 1 tablet (0.4 mg total) under the tongue every 5 (five) minutes as needed for chest pain.   omeprazole (PRILOSEC) 20 MG capsule Take 20 mg by mouth daily as needed (Acid  Reflux).   omeprazole (PRILOSEC) 40 MG capsule Take by mouth.   potassium chloride SA (K-DUR) 20 MEQ tablet Take 20 mEq by mouth daily.    QUEtiapine (SEROQUEL) 100 MG tablet    sodium polystyrene (KAYEXALATE) 15 GM/60ML suspension Take 60 mLs (15 g total) by mouth as directed. Take 15 g (60 mL) at noon today.  Take 15 g (60 mL) at 4 PM today.  We will call you tomorrow after labs to let you know if you need to take any additional doses.   tamsulosin (FLOMAX) 0.4 MG CAPS capsule    traZODone (DESYREL) 100 MG tablet Take 100 mg by mouth at bedtime.   vitamin B-12 (CYANOCOBALAMIN) 1000 MCG tablet Take 1,000 mcg by mouth daily.   ziprasidone (GEODON) 40 MG capsule Take 40 mg by mouth 2 (two) times daily with a meal.    [DISCONTINUED] metoprolol succinate (TOPROL XL) 25 MG 24 hr tablet Take 1 tablet (25 mg total) by mouth daily.     Allergies:   Horseradish [armoracia rusticana ext (horseradish)]   Social History   Socioeconomic History   Marital status: Married    Spouse name: Not on file   Number of children: Not on file   Years of education: Not on file   Highest education level: Not on file  Occupational History   Not on file  Tobacco Use   Smoking status: Former    Packs/day: 0.50    Years: 20.00    Total pack years: 10.00    Types: Cigarettes    Quit date: 12/27/1971    Years since quitting: 50.4   Smokeless tobacco: Never  Vaping Use   Vaping Use: Never used  Substance and Sexual Activity   Alcohol use: Yes    Comment: 1 pint of etoh a day ( not recently)   Drug use: No   Sexual activity: Not on file  Other Topics Concern   Not on file  Social History Narrative   Not on file   Social Determinants of Health   Financial Resource Strain: Not on file  Food Insecurity: No Food Insecurity (11/19/2020)   Hunger Vital Sign    Worried About Running Out of Food in the Last Year: Never true    Ran Out of Food in the Last Year: Never true  Transportation Needs: No  Transportation Needs (11/19/2020)   PRAPARE - Hydrologist (Medical): No    Lack of Transportation (Non-Medical): No  Physical Activity: Inactive (11/19/2020)   Exercise Vital Sign    Days of Exercise per Week: 0 days    Minutes of Exercise per Session: 0 min  Stress: No Stress Concern Present (  11/19/2020)   [place]    Feeling of Stress : Not at all  Social Connections: Not on file    Social: [place] Veteran, Wife is starting to have dementia, grandkids 57 and 9 in 2023  Family History: The patient's family history includes Hypertension in his mother. There is no history of Colon cancer.  ROS:   Please see the history of present illness.     All other systems reviewed and are negative.  EKGs/Labs/Other Studies Reviewed:    The following studies were reviewed today:  EKG:   10/17/21: SR with sinus arrhythmia rate 73 WNL 07/26/20: SR 84 WNL  Transthoracic Echocardiogram: Date:11/29/21 Results:  1. Left ventricular ejection fraction, by estimation, is 50 to 55%. The  left ventricle has low normal function. The left ventricle has no regional  wall motion abnormalities. There is mild left ventricular hypertrophy.  Left ventricular diastolic  parameters are indeterminate.   2. Right ventricular systolic function is normal. The right ventricular  size is normal. Tricuspid regurgitation signal is inadequate for assessing  PA pressure.   3. The mitral valve is grossly normal. Mild mitral valve regurgitation.   4. The aortic valve is tricuspid. There is mild calcification of the  aortic valve. Aortic valve regurgitation is not visualized.   5. Aortic dilatation noted. There is borderline dilatation of the aortic  root, measuring 40 mm.   6. The inferior vena cava is normal in size with greater than 50%  respiratory variability, suggesting right atrial pressure of 3 mmHg.   Recent  Labs: 04/23/2022: ALT 18; BUN 9; Creatinine, Ser 1.00; Potassium 3.3; Sodium 140 05/29/2022: Hemoglobin 8.0; Platelets 110  Recent Lipid Panel No results found for: "CHOL", "TRIG", "HDL", "CHOLHDL", "VLDL", "LDLCALC", "LDLDIRECT"   Physical Exam:    VS:  BP (!) 150/74   Pulse 62   Ht '5\' 5"'$  (1.651 m)   Wt 190 lb 3.2 oz (86.3 kg)   SpO2 99%   BMI 31.65 kg/m     Wt Readings from Last 3 Encounters:  06/04/22 190 lb 3.2 oz (86.3 kg)  04/23/22 190 lb 11.2 oz (86.5 kg)  03/12/22 193 lb (87.5 kg)    Gen: no distress   Neck: No JVD Cardiac: No Rubs or Gallops, no murmur, RRR +2 radial pulses Respiratory: Clear to auscultation bilaterally, normal effort, normal  respiratory rate GI: Soft, nontender, non-distended  MS: No edema;  moves all extremities Integument: Skin feels warm Neuro:  At time of evaluation, alert and oriented to person/place/time is conversant but with low health literacy Psych: Normal affect, patient feels OK   ASSESSMENT:    1. Thoracic aortic aneurysm (TAA), unspecified part, unspecified whether ruptured ([place])   2. HFrEF (heart failure with reduced ejection fraction) ([place])   3. Chronic renal insufficiency, stage 3 (moderate) (HCC)   4. Alcohol abuse     PLAN:     Heart Failure mildly reduced Ejection Fraction  CKD stage IIIA Alcohol dependence MCD on donepazeil Mild TAA 40 mm - Patient has not taken his medication (losartan 50 mg PO daily, lasix 20 mg PO daily and succinate 25 mg PO daily) - he takes his wife's BP every day and he will take it for the next two weeks; if above 135/85 he will call in and we will increase losartan to 100 mg PO daily with BMP in one week post - ASA is reasonable, on atorvastatin 10 with LDL < 100; without  CAD or prior stroke we can discuss ASA stop in the fiture - Echo in 11/2022 for mild TAA   Medication Adjustments/Labs and Tests Ordered: Current medicines are reviewed at length with the patient today.  Concerns regarding  medicines are outlined above.  Orders Placed This Encounter  Procedures   ECHOCARDIOGRAM COMPLETE   Meds ordered this encounter  Medications   metoprolol succinate (TOPROL XL) 25 MG 24 hr tablet    Sig: Take 1 tablet (25 mg total) by mouth daily.    Dispense:  90 tablet    Refill:  3     Patient Instructions  Medication Instructions:  Your physician recommends that you continue on your current medications as directed. Please refer to the Current Medication list given to you today.   Labwork: none  Testing/Procedures: Your physician has requested that you have an echocardiogram. Echocardiography is a painless test that uses sound waves to create images of your heart. It provides your doctor with information about the size and shape of your heart and how well your heart's chambers and valves are working. This procedure takes approximately one hour. There are no restrictions for this procedure.   Follow-Up: Your physician recommends that you schedule a follow-up appointment in: December 2023  Any Other Special Instructions Will Be Listed Below (If Applicable).  If you need a refill on your cardiac medications before your next appointment, please call your pharmacy.    Signed, Werner Lean, MD  06/04/2022 2:23 PM    [place]

## 2022-06-04 NOTE — Patient Instructions (Signed)
Medication Instructions:  Your physician recommends that you continue on your current medications as directed. Please refer to the Current Medication list given to you today.   Labwork: none  Testing/Procedures: Your physician has requested that you have an echocardiogram. Echocardiography is a painless test that uses sound waves to create images of your heart. It provides your doctor with information about the size and shape of your heart and how well your heart's chambers and valves are working. This procedure takes approximately one hour. There are no restrictions for this procedure.   Follow-Up: Your physician recommends that you schedule a follow-up appointment in: December 2023  Any Other Special Instructions Will Be Listed Below (If Applicable).  If you need a refill on your cardiac medications before your next appointment, please call your pharmacy.

## 2022-06-12 ENCOUNTER — Telehealth (HOSPITAL_COMMUNITY): Payer: Self-pay | Admitting: Physician Assistant

## 2022-06-12 ENCOUNTER — Inpatient Hospital Stay (HOSPITAL_COMMUNITY): Payer: Medicare Other

## 2022-06-12 VITALS — BP 158/74 | HR 70 | Temp 98.4°F | Resp 18

## 2022-06-12 DIAGNOSIS — D500 Iron deficiency anemia secondary to blood loss (chronic): Secondary | ICD-10-CM

## 2022-06-12 DIAGNOSIS — E538 Deficiency of other specified B group vitamins: Secondary | ICD-10-CM | POA: Diagnosis not present

## 2022-06-12 DIAGNOSIS — N183 Chronic kidney disease, stage 3 unspecified: Secondary | ICD-10-CM

## 2022-06-12 DIAGNOSIS — D649 Anemia, unspecified: Secondary | ICD-10-CM

## 2022-06-12 DIAGNOSIS — N182 Chronic kidney disease, stage 2 (mild): Secondary | ICD-10-CM | POA: Diagnosis not present

## 2022-06-12 DIAGNOSIS — D696 Thrombocytopenia, unspecified: Secondary | ICD-10-CM

## 2022-06-12 DIAGNOSIS — D631 Anemia in chronic kidney disease: Secondary | ICD-10-CM | POA: Diagnosis not present

## 2022-06-12 LAB — CBC
HCT: 25.3 % — ABNORMAL LOW (ref 39.0–52.0)
Hemoglobin: 8.2 g/dL — ABNORMAL LOW (ref 13.0–17.0)
MCH: 29.6 pg (ref 26.0–34.0)
MCHC: 32.4 g/dL (ref 30.0–36.0)
MCV: 91.3 fL (ref 80.0–100.0)
Platelets: 187 10*3/uL (ref 150–400)
RBC: 2.77 MIL/uL — ABNORMAL LOW (ref 4.22–5.81)
RDW: 18.6 % — ABNORMAL HIGH (ref 11.5–15.5)
WBC: 4.4 10*3/uL (ref 4.0–10.5)
nRBC: 0 % (ref 0.0–0.2)

## 2022-06-12 MED ORDER — EPOETIN ALFA-EPBX 40000 UNIT/ML IJ SOLN
40000.0000 [IU] | Freq: Once | INTRAMUSCULAR | Status: AC
  Administered 2022-06-12: 40000 [IU] via SUBCUTANEOUS
  Filled 2022-06-12: qty 1

## 2022-06-12 NOTE — Telephone Encounter (Signed)
I called and spoke with [name] this afternoon to address his anemia.  At his last appointment, we had discussed the possible need to increase the frequency of his Retacrit injections if he had worsening anemia.  Hgb has been < 9.0 for the past month, therefore we will change from Retacrit 40,000 units every 2 weeks to Retacrit 30,000 units weekly.  We will also check Cystatin C with next lab draw to see if he has any significant kidney disease that has not been detected with his creatinine.  Patient is scheduled for office visit in 2 weeks.  We will discuss at that time whether or not he needs additional work-up such as bone marrow biopsy.  Patient is agreeable with the above.  Harriett Rush, PA-C  06/12/22 1:08 PM

## 2022-06-12 NOTE — Progress Notes (Signed)
Patient tolerated Retacrit injection with no complaints voiced.  Hemoglobin today is 8.4.  Site clean and dry with no bruising or swelling noted.  No complaints of pain.  Discharged with vital signs stable and no signs or symptoms of distress noted.

## 2022-06-12 NOTE — Patient Instructions (Signed)
[facility]  Discharge Instructions: Thank you for choosing [facility] to provide your oncology and hematology care.  If you have a lab appointment with the [facility], please come in thru the Main Entrance and check in at the main information desk.  Wear comfortable clothing and clothing appropriate for easy access to any Portacath or PICC line.   We strive to give you quality time with your provider. You may need to reschedule your appointment if you arrive late (15 or more minutes).  Arriving late affects you and other patients whose appointments are after yours.  Also, if you miss three or more appointments without notifying the office, you may be dismissed from the clinic at the provider's discretion.      For prescription refill requests, have your pharmacy contact our office and allow 72 hours for refills to be completed.    Today you received the following chemotherapy and/or immunotherapy agents Retacrit.  Epoetin Alfa injection What is this medication? EPOETIN ALFA (e POE e tin AL fa) helps your body make more red blood cells. This medicine is used to treat anemia caused by chronic kidney disease, cancer chemotherapy, or HIV-therapy. It may also be used before surgery if you have anemia. This medicine may be used for other purposes; ask your health care provider or pharmacist if you have questions. COMMON BRAND NAME(S): Epogen, Procrit, Retacrit What should I tell my care team before I take this medication? They need to know if you have any of these conditions: cancer heart disease high blood pressure history of blood clots history of stroke low levels of folate, iron, or vitamin B12 in the blood seizures an unusual or allergic reaction to erythropoietin, albumin, benzyl alcohol, hamster proteins, other medicines, foods, dyes, or preservatives pregnant or trying to get pregnant breast-feeding How should I use this medication? This medicine is for  injection into a vein or under the skin. It is usually given by a health care professional in a hospital or clinic setting. If you get this medicine at home, you will be taught how to prepare and give this medicine. Use exactly as directed. Take your medicine at regular intervals. Do not take your medicine more often than directed. It is important that you put your used needles and syringes in a special sharps container. Do not put them in a trash can. If you do not have a sharps container, call your pharmacist or healthcare provider to get one. A special MedGuide will be given to you by the pharmacist with each prescription and refill. Be sure to read this information carefully each time. Talk to your pediatrician regarding the use of this medicine in children. While this drug may be prescribed for selected conditions, precautions do apply. Overdosage: If you think you have taken too much of this medicine contact a poison control center or emergency room at once. NOTE: This medicine is only for you. Do not share this medicine with others. What if I miss a dose? If you miss a dose, take it as soon as you can. If it is almost time for your next dose, take only that dose. Do not take double or extra doses. What may interact with this medication? Interactions have not been studied. This list may not describe all possible interactions. Give your health care provider a list of all the medicines, herbs, non-prescription drugs, or dietary supplements you use. Also tell them if you smoke, drink alcohol, or use illegal drugs. Some  items may interact with your medicine. What should I watch for while using this medication? Your condition will be monitored carefully while you are receiving this medicine. You may need blood work done while you are taking this medicine. This medicine may cause a decrease in vitamin B6. You should make sure that you get enough vitamin B6 while you are taking this medicine. Discuss  the foods you eat and the vitamins you take with your health care professional. What side effects may I notice from receiving this medication? Side effects that you should report to your doctor or health care professional as soon as possible: allergic reactions like skin rash, itching or hives, swelling of the face, lips, or tongue seizures signs and symptoms of a blood clot such as breathing problems; changes in vision; chest pain; severe, sudden headache; pain, swelling, warmth in the leg; trouble speaking; sudden numbness or weakness of the face, arm or leg signs and symptoms of a stroke like changes in vision; confusion; trouble speaking or understanding; severe headaches; sudden numbness or weakness of the face, arm or leg; trouble walking; dizziness; loss of balance or coordination Side effects that usually do not require medical attention (report to your doctor or health care professional if they continue or are bothersome): chills cough dizziness fever headaches joint pain muscle cramps muscle pain nausea, vomiting pain, redness, or irritation at site where injected This list may not describe all possible side effects. Call your doctor for medical advice about side effects. You may report side effects to FDA at 1-800-FDA-1088. Where should I keep my medication? Keep out of the reach of children. Store in a refrigerator between 2 and 8 degrees C (36 and 46 degrees F). Do not freeze or shake. Throw away any unused portion if using a single-dose vial. Multi-dose vials can be kept in the refrigerator for up to 21 days after the initial dose. Throw away unused medicine. NOTE: This sheet is a summary. It may not cover all possible information. If you have questions about this medicine, talk to your doctor, pharmacist, or health care provider.  2023 Elsevier/Gold Standard (2017-08-04 00:00:00)        To help prevent nausea and vomiting after your treatment, we encourage you to take your  nausea medication as directed.  BELOW ARE SYMPTOMS THAT SHOULD BE REPORTED IMMEDIATELY: *FEVER GREATER THAN 100.4 F (38 C) OR HIGHER *CHILLS OR SWEATING *NAUSEA AND VOMITING THAT IS NOT CONTROLLED WITH YOUR NAUSEA MEDICATION *UNUSUAL SHORTNESS OF BREATH *UNUSUAL BRUISING OR BLEEDING *URINARY PROBLEMS (pain or burning when urinating, or frequent urination) *BOWEL PROBLEMS (unusual diarrhea, constipation, pain near the anus) TENDERNESS IN MOUTH AND THROAT WITH OR WITHOUT PRESENCE OF ULCERS (sore throat, sores in mouth, or a toothache) UNUSUAL RASH, SWELLING OR PAIN  UNUSUAL VAGINAL DISCHARGE OR ITCHING   Items with * indicate a potential emergency and should be followed up as soon as possible or go to the Emergency Department if any problems should occur.  Please show the CHEMOTHERAPY ALERT CARD or IMMUNOTHERAPY ALERT CARD at check-in to the Emergency Department and triage nurse.  Should you have questions after your visit or need to cancel or reschedule your appointment, please contact [facility] [phone]  and follow the prompts.  Office hours are 8:00 a.m. to 4:30 p.m. Monday - Friday. Please note that voicemails left after 4:00 p.m. may not be returned until the following business day.  We are closed weekends and major holidays. You have access to a  nurse at all times for urgent questions. Please call the main number to the clinic [phone] and follow the prompts.  For any non-urgent questions, you may also contact your provider using MyChart. We now offer e-Visits for anyone 6 and older to request care online for non-urgent symptoms. For details visit [email or web address].   Also download the MyChart app! Go to the app store, search "MyChart", open the app, select [facility], and log in with your MyChart username and password.  Masks are optional in the cancer centers. If you would like for your care team to wear a mask while they are taking care of you, please let  them know. For doctor visits, patients may have with them one support person who is at least 74 years old. At this time, visitors are not allowed in the infusion area.

## 2022-06-13 DIAGNOSIS — R197 Diarrhea, unspecified: Secondary | ICD-10-CM | POA: Diagnosis not present

## 2022-06-16 ENCOUNTER — Ambulatory Visit (HOSPITAL_COMMUNITY)
Admission: RE | Admit: 2022-06-16 | Discharge: 2022-06-16 | Disposition: A | Discharge status: Home / Self Care | Payer: Medicare Other | Source: Ambulatory Visit | Attending: Internal Medicine | Admitting: Internal Medicine

## 2022-06-16 DIAGNOSIS — I712 Thoracic aortic aneurysm, without rupture, unspecified: Secondary | ICD-10-CM | POA: Diagnosis not present

## 2022-06-16 LAB — ECHOCARDIOGRAM COMPLETE
Area-P 1/2: 4.6 cm2
P 1/2 time: 558 msec
S' Lateral: 3.2 cm

## 2022-06-16 NOTE — Progress Notes (Signed)
*  PRELIMINARY RESULTS* Echocardiogram 2D Echocardiogram has been performed.  Roger Hansen 06/16/2022, 10:25 AM

## 2022-06-18 ENCOUNTER — Inpatient Hospital Stay (HOSPITAL_COMMUNITY): Payer: Medicare Other

## 2022-06-18 ENCOUNTER — Inpatient Hospital Stay (HOSPITAL_COMMUNITY): Payer: Medicare Other | Admitting: Physician Assistant

## 2022-06-19 ENCOUNTER — Other Ambulatory Visit (HOSPITAL_COMMUNITY): Payer: Medicare Other

## 2022-06-19 ENCOUNTER — Ambulatory Visit (HOSPITAL_COMMUNITY): Payer: Medicare Other

## 2022-06-25 NOTE — Progress Notes (Unsigned)
Roger Hansen, Roger Hansen [id number]   CLINIC:  Medical Oncology/Hematology  PCP:  Curlene Labrum, MD [place] [address] [phone]   REASON FOR VISIT:  Follow-up for anemia of CKD and iron deficiency   PRIOR THERAPY: None   CURRENT THERAPY: Retacrit injections weekly; intermittent parenteral iron therapy; oral iron supplement  INTERVAL HISTORY:  Roger Hansen 76 y.o. male returns for routine follow-up of his anemia of CKD and functional iron deficiency.  Roger Hansen was last seen by Tarri Abernethy PA-C on 04/23/2022.  At today's visit, Roger Hansen reports feeling fair.  No recent hospitalizations, surgeries, or changes in baseline health status.  Roger Hansen is tolerating his Retacrit injections well.  His blood pressure has been stable.  Roger Hansen has some chronic bilateral peripheral edema that "comes and goes," right slightly greater than left, but denies any acute changes.  No acute symptoms of DVT or PE.  Roger Hansen reports dark stools from iron pills, but denies any frank hematochezia, melena, epistaxis, or hematemesis.  His energy has been fair at about 75%.  Roger Hansen continues to have lightheadedness as well as occasional headaches, chronic restless legs, and chronic ice pica.  Roger Hansen does have intermittent chest pain and palpitations associated with anxiety and PTSD, but this is improved after being started on blood pressure medication by cardiologist.  Roger Hansen has occasional shortness of breath on exertion, which Roger Hansen reports is "normal for Roger Hansen."  Overall, Roger Hansen has not noticed any worsening in his symptoms or overall health condition.  Roger Hansen has 75% energy and 75% appetite. Roger Hansen endorses that Roger Hansen is maintaining a stable weight.   REVIEW OF SYSTEMS:    Review of Systems  Constitutional:  Negative for appetite change, chills, diaphoresis, fatigue, fever and unexpected weight change.  HENT:   Negative for lump/mass and nosebleeds.   Eyes:  Negative for eye problems.  Respiratory:  Positive  for shortness of breath (with exertion, mild). Negative for cough and hemoptysis.   Cardiovascular:  Positive for chest pain (none today). Negative for leg swelling and palpitations.  Gastrointestinal:  Positive for abdominal pain and diarrhea. Negative for blood in stool, constipation, nausea and vomiting.  Genitourinary:  Negative for hematuria.   Musculoskeletal:  Positive for arthralgias and back pain.  Skin: Negative.   Neurological:  Positive for dizziness, headaches, light-headedness and numbness.  Hematological:  Does not bruise/bleed easily.  Psychiatric/Behavioral:  Positive for depression and sleep disturbance. The Roger Hansen is nervous/anxious.       PAST MEDICAL/SURGICAL HISTORY:  Past Medical History:  Diagnosis Date   Alcohol abuse 11/24/2016   Anemia 10/11/2013   Cardiomyopathy ([place])    a. EF 50% by echo in 2019 with NST showing no reversible ischemia and thought to be alcohol-induced   Chronic renal disease, stage 3, moderately decreased glomerular filtration rate (GFR) between 30-59 mL/min/1.73 square meter (HCC) 12/02/2016   Colon polyps    GERD (gastroesophageal reflux disease)    High cholesterol    Hypertension    Sinus infection    Past Surgical History:  Procedure Laterality Date   BIOPSY  06/03/2019   Procedure: BIOPSY;  Surgeon: Rogene Houston, MD;  [facility];  Service: Endoscopy;;  esophagus   COLONOSCOPY N/A 10/26/2013   Procedure: COLONOSCOPY;  Surgeon: Rogene Houston, MD;  [facility];  Service: Endoscopy;  Laterality: N/A;  325-moved to 1230 Ann to notify pt   Colonoscopy with polypectomy     COLONOSCOPY  WITH PROPOFOL N/A 06/03/2019   Procedure: COLONOSCOPY WITH PROPOFOL;  Surgeon: Rogene Houston, MD;  [facility];  Service: Endoscopy;  Laterality: N/A;   ESOPHAGOGASTRODUODENOSCOPY (EGD) WITH PROPOFOL N/A 06/03/2019   Procedure: ESOPHAGOGASTRODUODENOSCOPY (EGD) WITH PROPOFOL;  Surgeon: Rogene Houston, MD;   [facility];  Service: Endoscopy;  Laterality: N/A;   POLYPECTOMY  06/03/2019   Procedure: POLYPECTOMY;  Surgeon: Rogene Houston, MD;  [facility];  Service: Endoscopy;;  colon     SOCIAL HISTORY:  Social History   Socioeconomic History   Marital status: Married    Spouse name: Not on file   Number of children: Not on file   Years of education: Not on file   Highest education level: Not on file  Occupational History   Not on file  Tobacco Use   Smoking status: Former    Packs/day: 0.50    Years: 20.00    Total pack years: 10.00    Types: Cigarettes    Quit date: 12/27/1971    Years since quitting: 50.5   Smokeless tobacco: Never  Vaping Use   Vaping Use: Never used  Substance and Sexual Activity   Alcohol use: Yes    Comment: 1 pint of etoh a day ( not recently)   Drug use: No   Sexual activity: Not on file  Other Topics Concern   Not on file  Social History Narrative   Not on file   Social Determinants of Health   Financial Resource Strain: Not on file  Food Insecurity: No Food Insecurity (11/19/2020)   Hunger Vital Sign    Worried About Running Out of Food in the Last Year: Never true    Ran Out of Food in the Last Year: Never true  Transportation Needs: No Transportation Needs (11/19/2020)   PRAPARE - Hydrologist (Medical): No    Lack of Transportation (Non-Medical): No  Physical Activity: Inactive (11/19/2020)   Exercise Vital Sign    Days of Exercise per Week: 0 days    Minutes of Exercise per Session: 0 min  Stress: No Stress Concern Present (11/19/2020)   [place]    Feeling of Stress : Not at all  Social Connections: Not on file  Intimate Partner Violence: Not At Risk (11/19/2020)   Humiliation, Afraid, Rape, and Kick questionnaire    Fear of Current or Ex-Partner: No    Emotionally Abused: No    Physically Abused: No    Sexually  Abused: No    FAMILY HISTORY:  Family History  Problem Relation Age of Onset   Hypertension Mother    Colon cancer Neg Hx     CURRENT MEDICATIONS:  Outpatient Encounter Medications as of 06/26/2022  Medication Sig Note   ALPRAZolam (XANAX) 1 MG tablet     aspirin 81 MG tablet Take 1 tablet (81 mg total) by mouth daily.    atorvastatin (LIPITOR) 10 MG tablet Take 10 mg by mouth daily.    buPROPion (WELLBUTRIN SR) 150 MG 12 hr tablet Take 150 mg by mouth daily.     buPROPion (WELLBUTRIN XL) 150 MG 24 hr tablet Take by mouth.    chlorhexidine (PERIDEX) 0.12 % solution     cholecalciferol (VITAMIN D) 1000 UNITS tablet Take 1,000 Units by mouth 2 (two) times daily.    donepezil (ARICEPT) 10 MG tablet     Eszopiclone 3 MG TABS Take 3  mg by mouth at bedtime.     Ferrous Sulfate (IRON) 325 (65 Fe) MG TABS TAKE ONE TABLET BY MOUTH DAILY WITH BREAKFAST. (Roger Hansen taking differently: 1 tablet 2 (two) times a day.)    folic acid (FOLVITE) 1 MG tablet Take 1 mg by mouth daily.    furosemide (LASIX) 20 MG tablet Take 20 mg by mouth daily.     gabapentin (NEURONTIN) 300 MG capsule Take 300 mg by mouth 3 (three) times daily as needed. 10/21/2016: Received from: External Pharmacy   GNP VITAMIN B-1 100 MG tablet Take 100 mg by mouth daily.     losartan (COZAAR) 50 MG tablet Take 50 mg by mouth daily.    metoprolol succinate (TOPROL XL) 25 MG 24 hr tablet Take 1 tablet (25 mg total) by mouth daily.    naproxen (NAPROSYN) 500 MG tablet Take by mouth.    nitroGLYCERIN (NITROSTAT) 0.4 MG SL tablet Place 1 tablet (0.4 mg total) under the tongue every 5 (five) minutes as needed for chest pain.    omeprazole (PRILOSEC) 20 MG capsule Take 20 mg by mouth daily as needed (Acid Reflux).    omeprazole (PRILOSEC) 40 MG capsule Take by mouth.    potassium chloride SA (K-DUR) 20 MEQ tablet Take 20 mEq by mouth daily.     QUEtiapine (SEROQUEL) 100 MG tablet     sodium polystyrene (KAYEXALATE) 15 GM/60ML suspension  Take 60 mLs (15 g total) by mouth as directed. Take 15 g (60 mL) at noon today.  Take 15 g (60 mL) at 4 PM today.  We will call you tomorrow after labs to let you know if you need to take any additional doses.    tamsulosin (FLOMAX) 0.4 MG CAPS capsule     traZODone (DESYREL) 100 MG tablet Take 100 mg by mouth at bedtime.    vitamin B-12 (CYANOCOBALAMIN) 1000 MCG tablet Take 1,000 mcg by mouth daily.    ziprasidone (GEODON) 40 MG capsule Take 40 mg by mouth 2 (two) times daily with a meal.     No facility-administered encounter medications on file as of 06/26/2022.    ALLERGIES:  Allergies  Allergen Reactions   Horseradish [Armoracia Rusticana Ext (Horseradish)] Anaphylaxis     PHYSICAL EXAM:    ECOG PERFORMANCE STATUS: 1 - Symptomatic but completely ambulatory  There were no vitals filed for this visit. There were no vitals filed for this visit. Physical Exam Constitutional:      Appearance: Normal appearance. Roger Hansen is obese.  HENT:     Head: Normocephalic and atraumatic.     Mouth/Throat:     Mouth: Mucous membranes are moist.  Eyes:     Extraocular Movements: Extraocular movements intact.     Pupils: Pupils are equal, round, and reactive to light.  Cardiovascular:     Rate and Rhythm: Normal rate and regular rhythm.     Pulses: Normal pulses.     Heart sounds: Normal heart sounds.  Pulmonary:     Effort: Pulmonary effort is normal.     Breath sounds: Normal breath sounds.  Abdominal:     General: Bowel sounds are normal.     Palpations: Abdomen is soft.     Tenderness: There is no abdominal tenderness.  Musculoskeletal:        General: No swelling.     Right lower leg: No edema.     Left lower leg: No edema.  Lymphadenopathy:     Cervical: No cervical adenopathy.  Skin:  General: Skin is warm and dry.  Neurological:     General: No focal deficit present.     Mental Status: Roger Hansen is alert and oriented to person, place, and time.  Psychiatric:        Mood and Affect:  Mood normal.        Behavior: Behavior normal.    LABORATORY DATA:  I have reviewed the labs as listed.  CBC    Component Value Date/Time   WBC 4.4 06/12/2022 0844   RBC 2.77 (L) 06/12/2022 0844   HGB 8.2 (L) 06/12/2022 0844   HCT 25.3 (L) 06/12/2022 0844   HCT 27.9 (L) 08/13/2016 0944   PLT 187 06/12/2022 0844   MCV 91.3 06/12/2022 0844   MCH 29.6 06/12/2022 0844   MCHC 32.4 06/12/2022 0844   RDW 18.6 (H) 06/12/2022 0844   LYMPHSABS 1.6 01/16/2022 1127   MONOABS 0.4 01/16/2022 1127   EOSABS 0.3 01/16/2022 1127   BASOSABS 0.1 01/16/2022 1127      Latest Ref Rng & Units 04/23/2022    8:02 AM 03/26/2022    9:45 AM 01/16/2022   11:27 AM  CMP  Glucose 70 - 99 mg/dL 93  102  97   BUN 8 - 23 mg/dL '9  11  20   ' Creatinine 0.61 - 1.24 mg/dL 1.00  1.15  1.03   Sodium 135 - 145 mmol/L 140  138  138   Potassium 3.5 - 5.1 mmol/L 3.3  2.8  4.5   Chloride 98 - 111 mmol/L 101  98  103   CO2 22 - 32 mmol/L 31  34  29   Calcium 8.9 - 10.3 mg/dL 8.1  8.4  9.3   Total Protein 6.5 - 8.1 g/dL 6.6  6.6  7.7   Total Bilirubin 0.3 - 1.2 mg/dL 1.0  1.1  0.8   Alkaline Phos 38 - 126 U/L 64  82  69   AST 15 - 41 U/L 29  50  13   ALT 0 - 44 U/L '18  20  10     ' DIAGNOSTIC IMAGING:  I have independently reviewed the relevant imaging and discussed with the Roger Hansen.   ASSESSMENT & PLAN: 1.   Normocytic anemia - His anemia dates back to at least 2014 with a negative peripheral work-up, negative bone marrow aspiration and biopsy and cytogenetics on 10/16/2016 (obtained in the setting of heavy alcohol use). The bone marrow showed slightly hypercellular marrow with trilineage hematopoiesis. Chromosome analysis was normal. - MGUS/myeloma panel (09/20/2021) was negative.  Hemolysis work-up was negative. - Roger Hansen has been receiving intermittent Epogen and parenteral iron therapy since June 2020. - Roger Hansen is a Jehovah witness and does not receive blood products. - Last colonoscopy and EGD on 06/03/2019: Mild  esophagitis and duodenitis on EGD, polyps and external hemorrhoids on colonoscopy - Roger Hansen is tolerating Epogen 30,000 units weekly (recently increased from 40,000 units every 2 weeks) **  - Roger Hansen continues to take iron tablets 325 mg once daily.    - Due to worsening anemia and functional iron deficiency, Roger Hansen was given 200 mg IV Venofer x2 on 10/14/21 and 10/25/2021, in coordination with his Retacrit.  Subsequent CBC showed significant improvement after coordination of IV iron and Retacrit. - Roger Hansen reports dark bowel movements, possibly from iron pill.  Roger Hansen denies any bright red blood per rectum, hematemesis, or epistaxis.       - Labs today (06/26/2022): Hgb 8.8/MCV 89.5, ferritin 667, iron saturation 31% -Unclear etiology  of anemia, but differential diagnosis includes anemia related to mild CKD, functional iron deficiency, and anemia of chronic disease.   Occult GI blood loss considered less likely, although Roger Hansen does have intermittent drops in his Hgb but without associated drops in iron Roger Hansen reports that Roger Hansen has been anemic ever since [place] and had significant agent orange exposure, which has been associated with aplastic anemia and other conditions. Would also consider possible early MDS (bone marrow biopsy in 2017 showed subtle dyspoietic changes that were nondiagnostic and nonspecific at that time) Unlikely that history of heavy alcohol use (from 2007-2017) would still be causing myelosuppression, since Roger Hansen has stopped drinking for several years. -- If Roger Hansen continues to have worsening anemia, repeat bone marrow biopsy would be reasonable.  Roger Hansen is hesitant to undergo repeat bone marrow biopsy and would like to avoid this for as long as possible. - PLAN: Since his last visit, I have increased his Retacrit to 30,000 units weekly (instead of 40,000 every 2 weeks).  We will continue Roger Hansen at this dose with close monitoring and titration of dose as needed. - Would consider repeat bone marrow biopsy if Roger Hansen has  continued worsening of his anemia, especially since prior biopsies showed subtle dyspoietic changes that were nonspecific/nondiagnostic at that time. - Could also consider switching to Aranesp if Retacrit loses its effectiveness. - No current indication for IV iron     - Repeat labs (CBC, CMP, iron/TIBC, ferritin) and RTC in 3 months     2.  CKD stage II with intermittent AKI - Previous labs show creatinine up to 2.26 - Most recent creatinine 1.02 (06/26/2022) with GFR >60 - PLAN: Continue follow-up with PCP for chronic kidney disease, may need referral to nephrology in the future, at PCP discretion.  Avoid NSAIDs and maintain adequate hydration.   3. Thrombocytopenia, mild - RESOLVED   - Mild thrombocytopenia noted on recent CBC (09/20/2021) with platelets 129 - Has resolved, with most recent CBC (06/26/2022) showing platelets 157   4.  B12 deficiency -Labs from 10/07/2021 showed normal B12 433, but elevated methylmalonic acid at 470, signifying mild B12 deficiency and despite Roger Hansen taking taking B12 daily 1,000 mcg - Roger Hansen was started on monthly B12 injections in November 2022 - B12 (04/23/2022) normal at 620, methylmalonic acid has normalized - PLAN: Continue monthly B12 injections.  We will recheck B12 and methylmalonic acid in October/November 2023.  5.  Hypokalemia - Critical hypokalemia today with potassium 2.2. -- Stopped taking potassium for several  months due to hyperkalemia, but has been back on potassium for the past few days per his PCP Dr. Pleas Koch - Reports that Roger Hansen has had diarrhea with 4-6 loose bowel movements daily for the past 2 weeks.  This is being worked up and followed by his PCP. - PLAN: Due to critical hyperkalemia, we will replete with IV potassium (40 mEq) as well as oral potassium (80 mEq). - We have called and left a message with Dr. Lizbeth Bark nurse, and Roger Hansen has been instructed to call PCP as well for ongoing follow-up instructions.   PLAN SUMMARY &  DISPOSITION:   CBC + Retacrit 30,000 units weekly Monthly B12 CBC and iron panel with same-day office visit in 12 weeks  All questions were answered. The Roger Hansen knows to call the clinic with any problems, questions or concerns.  Medical decision making: Moderate    Time spent on visit: I spent 20 minutes counseling the Roger Hansen face to face. The total time spent in the appointment  was 30 minutes and more than 50% was on counseling.   Harriett Rush, PA-C  06/26/2022 10:53 AM

## 2022-06-26 ENCOUNTER — Inpatient Hospital Stay (HOSPITAL_COMMUNITY): Payer: Medicare Other

## 2022-06-26 ENCOUNTER — Telehealth (HOSPITAL_COMMUNITY): Payer: Self-pay | Admitting: *Deleted

## 2022-06-26 ENCOUNTER — Other Ambulatory Visit (HOSPITAL_COMMUNITY): Payer: Self-pay | Admitting: Physician Assistant

## 2022-06-26 ENCOUNTER — Inpatient Hospital Stay (HOSPITAL_COMMUNITY): Payer: Medicare Other | Attending: Physician Assistant | Admitting: Physician Assistant

## 2022-06-26 VITALS — BP 132/84 | HR 72 | Temp 98.7°F | Resp 16 | Ht 65.0 in | Wt 183.2 lb

## 2022-06-26 DIAGNOSIS — E538 Deficiency of other specified B group vitamins: Secondary | ICD-10-CM | POA: Diagnosis not present

## 2022-06-26 DIAGNOSIS — Z87891 Personal history of nicotine dependence: Secondary | ICD-10-CM | POA: Insufficient documentation

## 2022-06-26 DIAGNOSIS — N183 Chronic kidney disease, stage 3 unspecified: Secondary | ICD-10-CM | POA: Diagnosis not present

## 2022-06-26 DIAGNOSIS — D631 Anemia in chronic kidney disease: Secondary | ICD-10-CM | POA: Insufficient documentation

## 2022-06-26 DIAGNOSIS — D696 Thrombocytopenia, unspecified: Secondary | ICD-10-CM

## 2022-06-26 DIAGNOSIS — D500 Iron deficiency anemia secondary to blood loss (chronic): Secondary | ICD-10-CM

## 2022-06-26 DIAGNOSIS — E876 Hypokalemia: Secondary | ICD-10-CM | POA: Insufficient documentation

## 2022-06-26 DIAGNOSIS — D649 Anemia, unspecified: Secondary | ICD-10-CM

## 2022-06-26 DIAGNOSIS — I129 Hypertensive chronic kidney disease with stage 1 through stage 4 chronic kidney disease, or unspecified chronic kidney disease: Secondary | ICD-10-CM | POA: Diagnosis not present

## 2022-06-26 LAB — CBC
HCT: 27.2 % — ABNORMAL LOW (ref 39.0–52.0)
Hemoglobin: 8.8 g/dL — ABNORMAL LOW (ref 13.0–17.0)
MCH: 28.9 pg (ref 26.0–34.0)
MCHC: 32.4 g/dL (ref 30.0–36.0)
MCV: 89.5 fL (ref 80.0–100.0)
Platelets: 157 10*3/uL (ref 150–400)
RBC: 3.04 MIL/uL — ABNORMAL LOW (ref 4.22–5.81)
RDW: 17.8 % — ABNORMAL HIGH (ref 11.5–15.5)
WBC: 5.5 10*3/uL (ref 4.0–10.5)
nRBC: 0 % (ref 0.0–0.2)

## 2022-06-26 LAB — COMPREHENSIVE METABOLIC PANEL
ALT: 9 U/L (ref 0–44)
AST: 23 U/L (ref 15–41)
Albumin: 3.2 g/dL — ABNORMAL LOW (ref 3.5–5.0)
Alkaline Phosphatase: 87 U/L (ref 38–126)
Anion gap: 7 (ref 5–15)
BUN: 6 mg/dL — ABNORMAL LOW (ref 8–23)
CO2: 36 mmol/L — ABNORMAL HIGH (ref 22–32)
Calcium: 8.2 mg/dL — ABNORMAL LOW (ref 8.9–10.3)
Chloride: 94 mmol/L — ABNORMAL LOW (ref 98–111)
Creatinine, Ser: 1.02 mg/dL (ref 0.61–1.24)
GFR, Estimated: >60 mL/min (ref >60)
Glucose, Bld: 100 mg/dL — ABNORMAL HIGH (ref 70–99)
Potassium: 2.2 mmol/L — CL (ref 3.5–5.1)
Sodium: 137 mmol/L (ref 135–145)
Total Bilirubin: 1.5 mg/dL — ABNORMAL HIGH (ref 0.3–1.2)
Total Protein: 6.2 g/dL — ABNORMAL LOW (ref 6.5–8.1)

## 2022-06-26 LAB — IRON AND TIBC
Iron: 52 ug/dL (ref 45–182)
Saturation Ratios: 31 % (ref 17.9–39.5)
TIBC: 166 ug/dL — ABNORMAL LOW (ref 250–450)
UIBC: 114 ug/dL

## 2022-06-26 LAB — MAGNESIUM: Magnesium: 1.2 mg/dL — ABNORMAL LOW (ref 1.7–2.4)

## 2022-06-26 LAB — LACTATE DEHYDROGENASE: LDH: 160 U/L (ref 98–192)

## 2022-06-26 LAB — FERRITIN: Ferritin: 667 ng/mL — ABNORMAL HIGH (ref 24–336)

## 2022-06-26 MED ORDER — POTASSIUM CHLORIDE 10 MEQ/100ML IV SOLN: 10.0000 meq | INTRAVENOUS | Status: DC

## 2022-06-26 MED ORDER — POTASSIUM CHLORIDE CRYS ER 20 MEQ PO TBCR
40.0000 meq | EXTENDED_RELEASE_TABLET | ORAL | Status: AC
  Administered 2022-06-26 (×2): 40 meq via ORAL
  Filled 2022-06-26 (×2): qty 2

## 2022-06-26 MED ORDER — MAGNESIUM OXIDE -MG SUPPLEMENT 400 (240 MG) MG PO TABS: 400.0000 mg | ORAL_TABLET | Freq: Two times a day (BID) | ORAL | 0 refills | Status: DC

## 2022-06-26 MED ORDER — POTASSIUM CHLORIDE IN NACL 40-0.9 MEQ/L-% IV SOLN
Freq: Once | INTRAVENOUS | Status: AC
  Filled 2022-06-26: qty 1000

## 2022-06-26 MED ORDER — EPOETIN ALFA-EPBX 10000 UNIT/ML IJ SOLN
30000.0000 [IU] | Freq: Once | INTRAMUSCULAR | Status: AC
  Administered 2022-06-26: 30000 [IU] via SUBCUTANEOUS
  Filled 2022-06-26: qty 3

## 2022-06-26 MED ORDER — CYANOCOBALAMIN 1000 MCG/ML IJ SOLN
1000.0000 ug | Freq: Once | INTRAMUSCULAR | Status: AC
  Administered 2022-06-26: 1000 ug via INTRAMUSCULAR
  Filled 2022-06-26: qty 1

## 2022-06-26 NOTE — Progress Notes (Unsigned)
CRITICAL VALUE ALERT Critical value received:  Potassium 2.2. Date of notification:  06-26-2022 Time of notification: 08:40 am.  Critical value read back:  Yes.   Nurse who received alert:  B. Charly Holcomb RN MD notified time and response:  R. Pennington PA @ 08:42 am.

## 2022-06-26 NOTE — Progress Notes (Signed)
Magnesium was added onto labs from today's visit due to hypokalemia.  Magnesium low at 1.2. Prescription sent to pharmacy for magnesium oxide 400 mg twice daily x5 days. Patient is aware. Patient informed that he will need to follow-up with his PCP for recheck magnesium and instructions for additional supplementation if needed.

## 2022-06-26 NOTE — Patient Instructions (Signed)
[facility]  Discharge Instructions: Thank you for choosing [facility] to provide your oncology and hematology care.  If you have a lab appointment with the [facility], please come in thru the Main Entrance and check in at the main information desk.  Wear comfortable clothing and clothing appropriate for easy access to any Portacath or PICC line.   We strive to give you quality time with your provider. You may need to reschedule your appointment if you arrive late (15 or more minutes).  Arriving late affects you and other patients whose appointments are after yours.  Also, if you miss three or more appointments without notifying the office, you may be dismissed from the clinic at the provider's discretion.      For prescription refill requests, have your pharmacy contact our office and allow 72 hours for refills to be completed.    Today you received B12 injection and Retacrit 30,000 units     BELOW ARE SYMPTOMS THAT SHOULD BE REPORTED IMMEDIATELY: *FEVER GREATER THAN 100.4 F (38 C) OR HIGHER *CHILLS OR SWEATING *NAUSEA AND VOMITING THAT IS NOT CONTROLLED WITH YOUR NAUSEA MEDICATION *UNUSUAL SHORTNESS OF BREATH *UNUSUAL BRUISING OR BLEEDING *URINARY PROBLEMS (pain or burning when urinating, or frequent urination) *BOWEL PROBLEMS (unusual diarrhea, constipation, pain near the anus) TENDERNESS IN MOUTH AND THROAT WITH OR WITHOUT PRESENCE OF ULCERS (sore throat, sores in mouth, or a toothache) UNUSUAL RASH, SWELLING OR PAIN  UNUSUAL VAGINAL DISCHARGE OR ITCHING   Items with * indicate a potential emergency and should be followed up as soon as possible or go to the Emergency Department if any problems should occur.  Please show the CHEMOTHERAPY ALERT CARD or IMMUNOTHERAPY ALERT CARD at check-in to the Emergency Department and triage nurse.  Should you have questions after your visit or need to cancel or reschedule your appointment, please contact [facility] [phone]  and follow the prompts.  Office hours are 8:00 a.m. to 4:30 p.m. Monday - Friday. Please note that voicemails left after 4:00 p.m. may not be returned until the following business day.  We are closed weekends and major holidays. You have access to a nurse at all times for urgent questions. Please call the main number to the clinic [phone] and follow the prompts.  For any non-urgent questions, you may also contact your provider using MyChart. We now offer e-Visits for anyone 12 and older to request care online for non-urgent symptoms. For details visit [email or web address].   Also download the MyChart app! Go to the app store, search "MyChart", open the app, select [facility], and log in with your MyChart username and password.  Masks are optional in the cancer centers. If you would like for your care team to wear a mask while they are taking care of you, please let them know. For doctor visits, patients may have with them one support person who is at least 74 years old. At this time, visitors are not allowed in the infusion area.

## 2022-06-26 NOTE — Progress Notes (Signed)
BELEN ZWAHLEN presents today for injection per the provider's orders.  Retacrit 30,000 units and B12 injection administration without incident; injection site WNL; see MAR for injection details.  Patient tolerated procedure well and without incident.  No questions or complaints noted at this time.   Discharged from clinic ambulatory in stable condition. Alert and oriented x 3. F/U with [facility] as scheduled.

## 2022-06-26 NOTE — Patient Instructions (Signed)
[facility]  Discharge Instructions: Thank you for choosing [facility] to provide your oncology and hematology care.  If you have a lab appointment with the [facility], please come in thru the Main Entrance and check in at the main information desk.  Wear comfortable clothing and clothing appropriate for easy access to any Portacath or PICC line.   We strive to give you quality time with your provider. You may need to reschedule your appointment if you arrive late (15 or more minutes).  Arriving late affects you and other patients whose appointments are after yours.  Also, if you miss three or more appointments without notifying the office, you may be dismissed from the clinic at the provider's discretion.      For prescription refill requests, have your pharmacy contact our office and allow 72 hours for refills to be completed.    Today you received the following chemotherapy and/or immunotherapy agents 65mq p.o x 2 doses and 410m IV potassium chloride  BELOW ARE SYMPTOMS THAT SHOULD BE REPORTED IMMEDIATELY: *FEVER GREATER THAN 100.4 F (38 C) OR HIGHER *CHILLS OR SWEATING *NAUSEA AND VOMITING THAT IS NOT CONTROLLED WITH YOUR NAUSEA MEDICATION *UNUSUAL SHORTNESS OF BREATH *UNUSUAL BRUISING OR BLEEDING *URINARY PROBLEMS (pain or burning when urinating, or frequent urination) *BOWEL PROBLEMS (unusual diarrhea, constipation, pain near the anus) TENDERNESS IN MOUTH AND THROAT WITH OR WITHOUT PRESENCE OF ULCERS (sore throat, sores in mouth, or a toothache) UNUSUAL RASH, SWELLING OR PAIN  UNUSUAL VAGINAL DISCHARGE OR ITCHING   Items with * indicate a potential emergency and should be followed up as soon as possible or go to the Emergency Department if any problems should occur.  Please show the CHEMOTHERAPY ALERT CARD or IMMUNOTHERAPY ALERT CARD at check-in to the Emergency Department and triage nurse.  Should you have questions after your visit or need to  cancel or reschedule your appointment, please contact [phone] follow the prompts.  Office hours are 8:00 a.m. to 4:30 p.m. Monday - Friday. Please note that voicemails left after 4:00 p.m. may not be returned until the following business day.  We are closed weekends and major holidays. You have access to a nurse at all times for urgent questions. Please call the main number to the clinic [phone] follow the prompts.  For any non-urgent questions, you may also contact your provider using MyChart. We now offer e-Visits for anyone 1867nd older to request care online for non-urgent symptoms. For details visit [email or web address]  Also download the MyChart app! Go to the app store, search "MyChart", open the app, select [facility], and log in with your MyChart username and password.  Masks are optional in the cancer centers. If you would like for your care team to wear a mask while they are taking care of you, please let them know. For doctor visits, patients may have with them one support person who is at least 1870ears old. At this time, visitors are not allowed in the infusion area.

## 2022-06-26 NOTE — Patient Instructions (Addendum)
[place] at [facility] Discharge Instructions  You were seen today by Tarri Abernethy PA-C.  ANEMIA: Your blood levels are slightly lower than at your last visit.  We have changed your Retacrit injection to WEEKLY shots, instead of every other week.  If you continue to have worsening anemia, we may also consider scheduling you for another bone marrow biopsy in the future.  B12 DEFICIENCY: Continue B12 injections once per month.  **LOW POTASSIUM: Your potassium was critically low at 2.2 today.  We treated you with IV potassium (40 mEq) as well as extra potassium pills (80 mEq) here in the clinic.  You do not need to take any more potassium supplement at home today, but should continue your potassium supplement at home starting tomorrow.  This is likely related to your diarrhea.  Please reach out to Dr. Pleas Koch ASAP for further follow-up instructions, since he will be the one following and treating your diarrhea and potassium in the future.  FOLLOW-UP APPOINTMENT: Repeat labs and follow-up visit in 3 months.   - - - - - - - - - - - - - - - - - -    Thank you for choosing [place] at [facility] to provide your oncology and hematology care.  To afford each patient quality time with our provider, please arrive at least 15 minutes before your scheduled appointment time.   If you have a lab appointment with the [facility] please come in thru the Main Entrance and check in at the main information desk.  You need to re-schedule your appointment should you arrive 10 or more minutes late.  We strive to give you quality time with our providers, and arriving late affects you and other patients whose appointments are after yours.  Also, if you no show three or more times for appointments you may be dismissed from the clinic at the providers discretion.     Again, thank you for choosing [facility].  Our hope is that these requests will  decrease the amount of time that you wait before being seen by our physicians.       _____________________________________________________________  Should you have questions after your visit to [facility], please contact our office at [phone] and follow the prompts.  Our office hours are 8:00 a.m. and 4:30 p.m. Monday - Friday.  Please note that voicemails left after 4:00 p.m. may not be returned until the following business day.  We are closed weekends and major holidays.  You do have access to a nurse 24-7, just call the main number to the clinic [phone] and do not press any options, hold on the line and a nurse will answer the phone.    For prescription refill requests, have your pharmacy contact our office and allow 72 hours.    Due to Covid, you will need to wear a mask upon entering the hospital. If you do not have a mask, a mask will be given to you at the Main Entrance upon arrival. For doctor visits, patients may have 1 support person age 69 or older with them. For treatment visits, patients can not have anyone with them due to social distancing guidelines and our immunocompromised population.

## 2022-06-26 NOTE — Progress Notes (Signed)
Patient presents today for f/u appointment with R. Pennington PA. Potassium 2.2. Orders received to Give potassium 40 mEq PO x 2 and 40 mEq potassium IV.

## 2022-06-26 NOTE — Telephone Encounter (Signed)
Spoke with Dr. Lizbeth Bark nurse Angela Nevin, regarding K+ 2.2.  Informed her that he received 40 meq IV in office today, in addition to 80 meq by mouth.  They will see him in the office with labs on Monday and will call patient with appointments.

## 2022-06-26 NOTE — Progress Notes (Signed)
10mq p.o x 2 does of potassium chloride and 40 mEq IV potassium chloride given today per MD orders. Tolerated infusion without adverse affects. Vital signs stable. No complaints at this time. Discharged from clinic ambulatory in stable condition. Alert and oriented x 3. [facility]

## 2022-06-30 DIAGNOSIS — R197 Diarrhea, unspecified: Secondary | ICD-10-CM | POA: Diagnosis not present

## 2022-06-30 DIAGNOSIS — Z6832 Body mass index (BMI) 32.0-32.9, adult: Secondary | ICD-10-CM | POA: Diagnosis not present

## 2022-06-30 DIAGNOSIS — R152 Fecal urgency: Secondary | ICD-10-CM | POA: Diagnosis not present

## 2022-06-30 DIAGNOSIS — E876 Hypokalemia: Secondary | ICD-10-CM | POA: Diagnosis not present

## 2022-07-01 ENCOUNTER — Encounter (INDEPENDENT_AMBULATORY_CARE_PROVIDER_SITE_OTHER): Payer: Self-pay | Admitting: *Deleted

## 2022-07-02 ENCOUNTER — Inpatient Hospital Stay (HOSPITAL_COMMUNITY): Payer: Medicare Other | Admitting: Physician Assistant

## 2022-07-02 ENCOUNTER — Inpatient Hospital Stay (HOSPITAL_COMMUNITY): Payer: Medicare Other

## 2022-07-03 ENCOUNTER — Encounter (HOSPITAL_COMMUNITY): Payer: Self-pay

## 2022-07-03 ENCOUNTER — Other Ambulatory Visit (HOSPITAL_COMMUNITY): Payer: Self-pay | Admitting: Physician Assistant

## 2022-07-03 ENCOUNTER — Inpatient Hospital Stay (HOSPITAL_COMMUNITY): Payer: Medicare Other

## 2022-07-03 VITALS — BP 160/80 | HR 63 | Temp 98.5°F | Resp 17

## 2022-07-03 DIAGNOSIS — I129 Hypertensive chronic kidney disease with stage 1 through stage 4 chronic kidney disease, or unspecified chronic kidney disease: Secondary | ICD-10-CM | POA: Diagnosis not present

## 2022-07-03 DIAGNOSIS — D696 Thrombocytopenia, unspecified: Secondary | ICD-10-CM

## 2022-07-03 DIAGNOSIS — D500 Iron deficiency anemia secondary to blood loss (chronic): Secondary | ICD-10-CM

## 2022-07-03 DIAGNOSIS — E876 Hypokalemia: Secondary | ICD-10-CM | POA: Diagnosis not present

## 2022-07-03 DIAGNOSIS — N183 Chronic kidney disease, stage 3 unspecified: Secondary | ICD-10-CM | POA: Diagnosis not present

## 2022-07-03 DIAGNOSIS — D649 Anemia, unspecified: Secondary | ICD-10-CM

## 2022-07-03 DIAGNOSIS — D631 Anemia in chronic kidney disease: Secondary | ICD-10-CM | POA: Diagnosis not present

## 2022-07-03 DIAGNOSIS — Z87891 Personal history of nicotine dependence: Secondary | ICD-10-CM | POA: Diagnosis not present

## 2022-07-03 DIAGNOSIS — E538 Deficiency of other specified B group vitamins: Secondary | ICD-10-CM | POA: Diagnosis not present

## 2022-07-03 LAB — CBC
HCT: 25.8 % — ABNORMAL LOW (ref 39.0–52.0)
Hemoglobin: 7.9 g/dL — ABNORMAL LOW (ref 13.0–17.0)
MCH: 28 pg (ref 26.0–34.0)
MCHC: 30.6 g/dL (ref 30.0–36.0)
MCV: 91.5 fL (ref 80.0–100.0)
Platelets: 234 10*3/uL (ref 150–400)
RBC: 2.82 MIL/uL — ABNORMAL LOW (ref 4.22–5.81)
RDW: 18.5 % — ABNORMAL HIGH (ref 11.5–15.5)
WBC: 3.8 10*3/uL — ABNORMAL LOW (ref 4.0–10.5)
nRBC: 0 % (ref 0.0–0.2)

## 2022-07-03 MED ORDER — EPOETIN ALFA-EPBX 40000 UNIT/ML IJ SOLN
40000.0000 [IU] | Freq: Once | INTRAMUSCULAR | Status: AC
  Administered 2022-07-03: 40000 [IU] via SUBCUTANEOUS
  Filled 2022-07-03: qty 1

## 2022-07-03 MED ORDER — EPOETIN ALFA-EPBX 10000 UNIT/ML IJ SOLN
30000.0000 [IU] | Freq: Once | INTRAMUSCULAR | Status: DC
  Filled 2022-07-03: qty 3

## 2022-07-03 NOTE — Progress Notes (Signed)
Patient presents for Retacrit injection. Vital signs within parameters for injection. Patient has not taken his blood pressure medication today. Patient states, " I have not ate anything today and I take it with food. " Patient teaching performed. Patient denies any headache, blurred vision or dizziness.   Roger Hansen presents today for injection per the provider's orders.  Retacrit administration without incident; injection site WNL; see MAR for injection details.  Patient tolerated procedure well and without incident.  No questions or complaints noted at this time.

## 2022-07-03 NOTE — Patient Instructions (Signed)
[place]  Discharge Instructions: Thank you for choosing [facility] to provide your oncology and hematology care.  If you have a lab appointment with the [facility], please come in thru the Main Entrance and check in at the main information desk.  Wear comfortable clothing and clothing appropriate for easy access to any Portacath or PICC line.   We strive to give you quality time with your provider. You may need to reschedule your appointment if you arrive late (15 or more minutes).  Arriving late affects you and other patients whose appointments are after yours.  Also, if you miss three or more appointments without notifying the office, you may be dismissed from the clinic at the provider's discretion.      For prescription refill requests, have your pharmacy contact our office and allow 72 hours for refills to be completed.    Today you received the following chemotherapy and/or immunotherapy agents Retacrit .  Epoetin Alfa injection What is this medication? EPOETIN ALFA (e POE e tin AL fa) helps your body make more red blood cells. This medicine is used to treat anemia caused by chronic kidney disease, cancer chemotherapy, or HIV-therapy. It may also be used before surgery if you have anemia. This medicine may be used for other purposes; ask your health care provider or pharmacist if you have questions. COMMON BRAND NAME(S): Epogen, Procrit, Retacrit What should I tell my care team before I take this medication? They need to know if you have any of these conditions: cancer heart disease high blood pressure history of blood clots history of stroke low levels of folate, iron, or vitamin B12 in the blood seizures an unusual or allergic reaction to erythropoietin, albumin, benzyl alcohol, hamster proteins, other medicines, foods, dyes, or preservatives pregnant or trying to get pregnant breast-feeding How should I use this medication? This medicine is for  injection into a vein or under the skin. It is usually given by a health care professional in a hospital or clinic setting. If you get this medicine at home, you will be taught how to prepare and give this medicine. Use exactly as directed. Take your medicine at regular intervals. Do not take your medicine more often than directed. It is important that you put your used needles and syringes in a special sharps container. Do not put them in a trash can. If you do not have a sharps container, call your pharmacist or healthcare provider to get one. A special MedGuide will be given to you by the pharmacist with each prescription and refill. Be sure to read this information carefully each time. Talk to your pediatrician regarding the use of this medicine in children. While this drug may be prescribed for selected conditions, precautions do apply. Overdosage: If you think you have taken too much of this medicine contact a poison control center or emergency room at once. NOTE: This medicine is only for you. Do not share this medicine with others. What if I miss a dose? If you miss a dose, take it as soon as you can. If it is almost time for your next dose, take only that dose. Do not take double or extra doses. What may interact with this medication? Interactions have not been studied. This list may not describe all possible interactions. Give your health care provider a list of all the medicines, herbs, non-prescription drugs, or dietary supplements you use. Also tell them if you smoke, drink alcohol, or use illegal drugs.  Some items may interact with your medicine. What should I watch for while using this medication? Your condition will be monitored carefully while you are receiving this medicine. You may need blood work done while you are taking this medicine. This medicine may cause a decrease in vitamin B6. You should make sure that you get enough vitamin B6 while you are taking this medicine. Discuss  the foods you eat and the vitamins you take with your health care professional. What side effects may I notice from receiving this medication? Side effects that you should report to your doctor or health care professional as soon as possible: allergic reactions like skin rash, itching or hives, swelling of the face, lips, or tongue seizures signs and symptoms of a blood clot such as breathing problems; changes in vision; chest pain; severe, sudden headache; pain, swelling, warmth in the leg; trouble speaking; sudden numbness or weakness of the face, arm or leg signs and symptoms of a stroke like changes in vision; confusion; trouble speaking or understanding; severe headaches; sudden numbness or weakness of the face, arm or leg; trouble walking; dizziness; loss of balance or coordination Side effects that usually do not require medical attention (report to your doctor or health care professional if they continue or are bothersome): chills cough dizziness fever headaches joint pain muscle cramps muscle pain nausea, vomiting pain, redness, or irritation at site where injected This list may not describe all possible side effects. Call your doctor for medical advice about side effects. You may report side effects to FDA at 1-800-FDA-1088. Where should I keep my medication? Keep out of the reach of children. Store in a refrigerator between 2 and 8 degrees C (36 and 46 degrees F). Do not freeze or shake. Throw away any unused portion if using a single-dose vial. Multi-dose vials can be kept in the refrigerator for up to 21 days after the initial dose. Throw away unused medicine. NOTE: This sheet is a summary. It may not cover all possible information. If you have questions about this medicine, talk to your doctor, pharmacist, or health care provider.  2023 Elsevier/Gold Standard (2017-08-04 00:00:00)       To help prevent nausea and vomiting after your treatment, we encourage you to take your  nausea medication as directed.  BELOW ARE SYMPTOMS THAT SHOULD BE REPORTED IMMEDIATELY: *FEVER GREATER THAN 100.4 F (38 C) OR HIGHER *CHILLS OR SWEATING *NAUSEA AND VOMITING THAT IS NOT CONTROLLED WITH YOUR NAUSEA MEDICATION *UNUSUAL SHORTNESS OF BREATH *UNUSUAL BRUISING OR BLEEDING *URINARY PROBLEMS (pain or burning when urinating, or frequent urination) *BOWEL PROBLEMS (unusual diarrhea, constipation, pain near the anus) TENDERNESS IN MOUTH AND THROAT WITH OR WITHOUT PRESENCE OF ULCERS (sore throat, sores in mouth, or a toothache) UNUSUAL RASH, SWELLING OR PAIN  UNUSUAL VAGINAL DISCHARGE OR ITCHING   Items with * indicate a potential emergency and should be followed up as soon as possible or go to the Emergency Department if any problems should occur.  Please show the CHEMOTHERAPY ALERT CARD or IMMUNOTHERAPY ALERT CARD at check-in to the Emergency Department and triage nurse.  Should you have questions after your visit or need to cancel or reschedule your appointment, please contact [facility] [phone]  and follow the prompts.  Office hours are 8:00 a.m. to 4:30 p.m. Monday - Friday. Please note that voicemails left after 4:00 p.m. may not be returned until the following business day.  We are closed weekends and major holidays. You have access to a  nurse at all times for urgent questions. Please call the main number to the clinic [phone] and follow the prompts.  For any non-urgent questions, you may also contact your provider using MyChart. We now offer e-Visits for anyone 66 and older to request care online for non-urgent symptoms. For details visit [email or web address].   Also download the MyChart app! Go to the app store, search "MyChart", open the app, select [place], and log in with your MyChart username and password.  Masks are optional in the cancer centers. If you would like for your care team to wear a mask while they are taking care of you, please let  them know. For doctor visits, patients may have with them one support person who is at least 74 years old. At this time, visitors are not allowed in the infusion area.

## 2022-07-10 ENCOUNTER — Inpatient Hospital Stay (HOSPITAL_COMMUNITY): Payer: Medicare Other

## 2022-07-10 ENCOUNTER — Encounter (HOSPITAL_COMMUNITY): Payer: Self-pay

## 2022-07-10 VITALS — BP 172/81 | HR 63 | Temp 97.9°F | Resp 17

## 2022-07-10 DIAGNOSIS — E876 Hypokalemia: Secondary | ICD-10-CM | POA: Diagnosis not present

## 2022-07-10 DIAGNOSIS — Z87891 Personal history of nicotine dependence: Secondary | ICD-10-CM | POA: Diagnosis not present

## 2022-07-10 DIAGNOSIS — N183 Chronic kidney disease, stage 3 unspecified: Secondary | ICD-10-CM

## 2022-07-10 DIAGNOSIS — E538 Deficiency of other specified B group vitamins: Secondary | ICD-10-CM | POA: Diagnosis not present

## 2022-07-10 DIAGNOSIS — I129 Hypertensive chronic kidney disease with stage 1 through stage 4 chronic kidney disease, or unspecified chronic kidney disease: Secondary | ICD-10-CM | POA: Diagnosis not present

## 2022-07-10 DIAGNOSIS — D631 Anemia in chronic kidney disease: Secondary | ICD-10-CM | POA: Diagnosis not present

## 2022-07-10 DIAGNOSIS — D500 Iron deficiency anemia secondary to blood loss (chronic): Secondary | ICD-10-CM

## 2022-07-10 DIAGNOSIS — D696 Thrombocytopenia, unspecified: Secondary | ICD-10-CM

## 2022-07-10 DIAGNOSIS — D649 Anemia, unspecified: Secondary | ICD-10-CM

## 2022-07-10 LAB — CBC
HCT: 28.2 % — ABNORMAL LOW (ref 39.0–52.0)
Hemoglobin: 8.5 g/dL — ABNORMAL LOW (ref 13.0–17.0)
MCH: 28 pg (ref 26.0–34.0)
MCHC: 30.1 g/dL (ref 30.0–36.0)
MCV: 92.8 fL (ref 80.0–100.0)
Platelets: 278 10*3/uL (ref 150–400)
RBC: 3.04 MIL/uL — ABNORMAL LOW (ref 4.22–5.81)
RDW: 18.3 % — ABNORMAL HIGH (ref 11.5–15.5)
WBC: 4.6 10*3/uL (ref 4.0–10.5)
nRBC: 0 % (ref 0.0–0.2)

## 2022-07-10 MED ORDER — EPOETIN ALFA 40000 UNIT/ML IJ SOLN
40000.0000 [IU] | Freq: Once | INTRAMUSCULAR | Status: AC
  Administered 2022-07-10: 40000 [IU] via SUBCUTANEOUS
  Filled 2022-07-10: qty 1

## 2022-07-10 MED ORDER — EPOETIN ALFA-EPBX 40000 UNIT/ML IJ SOLN: 40000.0000 [IU] | Freq: Once | INTRAMUSCULAR | Status: DC

## 2022-07-10 NOTE — Patient Instructions (Signed)
[place]  Discharge Instructions: Thank you for choosing [facility] to provide your oncology and hematology care.  If you have a lab appointment with the [facility], please come in thru the Main Entrance and check in at the main information desk.  Wear comfortable clothing and clothing appropriate for easy access to any Portacath or PICC line.   We strive to give you quality time with your provider. You may need to reschedule your appointment if you arrive late (15 or more minutes).  Arriving late affects you and other patients whose appointments are after yours.  Also, if you miss three or more appointments without notifying the office, you may be dismissed from the clinic at the provider's discretion.      For prescription refill requests, have your pharmacy contact our office and allow 72 hours for refills to be completed.    Today you received the following chemotherapy and/or immunotherapy agents Procrit injection.  Epoetin Alfa injection What is this medication? EPOETIN ALFA (e POE e tin AL fa) helps your body make more red blood cells. This medicine is used to treat anemia caused by chronic kidney disease, cancer chemotherapy, or HIV-therapy. It may also be used before surgery if you have anemia. This medicine may be used for other purposes; ask your health care provider or pharmacist if you have questions. COMMON BRAND NAME(S): Epogen, Procrit, Retacrit What should I tell my care team before I take this medication? They need to know if you have any of these conditions: cancer heart disease high blood pressure history of blood clots history of stroke low levels of folate, iron, or vitamin B12 in the blood seizures an unusual or allergic reaction to erythropoietin, albumin, benzyl alcohol, hamster proteins, other medicines, foods, dyes, or preservatives pregnant or trying to get pregnant breast-feeding How should I use this medication? This  medicine is for injection into a vein or under the skin. It is usually given by a health care professional in a hospital or clinic setting. If you get this medicine at home, you will be taught how to prepare and give this medicine. Use exactly as directed. Take your medicine at regular intervals. Do not take your medicine more often than directed. It is important that you put your used needles and syringes in a special sharps container. Do not put them in a trash can. If you do not have a sharps container, call your pharmacist or healthcare provider to get one. A special MedGuide will be given to you by the pharmacist with each prescription and refill. Be sure to read this information carefully each time. Talk to your pediatrician regarding the use of this medicine in children. While this drug may be prescribed for selected conditions, precautions do apply. Overdosage: If you think you have taken too much of this medicine contact a poison control center or emergency room at once. NOTE: This medicine is only for you. Do not share this medicine with others. What if I miss a dose? If you miss a dose, take it as soon as you can. If it is almost time for your next dose, take only that dose. Do not take double or extra doses. What may interact with this medication? Interactions have not been studied. This list may not describe all possible interactions. Give your health care provider a list of all the medicines, herbs, non-prescription drugs, or dietary supplements you use. Also tell them if you smoke, drink alcohol, or use illegal drugs.  Some items may interact with your medicine. What should I watch for while using this medication? Your condition will be monitored carefully while you are receiving this medicine. You may need blood work done while you are taking this medicine. This medicine may cause a decrease in vitamin B6. You should make sure that you get enough vitamin B6 while you are taking this  medicine. Discuss the foods you eat and the vitamins you take with your health care professional. What side effects may I notice from receiving this medication? Side effects that you should report to your doctor or health care professional as soon as possible: allergic reactions like skin rash, itching or hives, swelling of the face, lips, or tongue seizures signs and symptoms of a blood clot such as breathing problems; changes in vision; chest pain; severe, sudden headache; pain, swelling, warmth in the leg; trouble speaking; sudden numbness or weakness of the face, arm or leg signs and symptoms of a stroke like changes in vision; confusion; trouble speaking or understanding; severe headaches; sudden numbness or weakness of the face, arm or leg; trouble walking; dizziness; loss of balance or coordination Side effects that usually do not require medical attention (report to your doctor or health care professional if they continue or are bothersome): chills cough dizziness fever headaches joint pain muscle cramps muscle pain nausea, vomiting pain, redness, or irritation at site where injected This list may not describe all possible side effects. Call your doctor for medical advice about side effects. You may report side effects to FDA at 1-800-FDA-1088. Where should I keep my medication? Keep out of the reach of children. Store in a refrigerator between 2 and 8 degrees C (36 and 46 degrees F). Do not freeze or shake. Throw away any unused portion if using a single-dose vial. Multi-dose vials can be kept in the refrigerator for up to 21 days after the initial dose. Throw away unused medicine. NOTE: This sheet is a summary. It may not cover all possible information. If you have questions about this medicine, talk to your doctor, pharmacist, or health care provider.  2023 Elsevier/Gold Standard (2017-08-04 00:00:00)       To help prevent nausea and vomiting after your treatment, we encourage  you to take your nausea medication as directed.  BELOW ARE SYMPTOMS THAT SHOULD BE REPORTED IMMEDIATELY: *FEVER GREATER THAN 100.4 F (38 C) OR HIGHER *CHILLS OR SWEATING *NAUSEA AND VOMITING THAT IS NOT CONTROLLED WITH YOUR NAUSEA MEDICATION *UNUSUAL SHORTNESS OF BREATH *UNUSUAL BRUISING OR BLEEDING *URINARY PROBLEMS (pain or burning when urinating, or frequent urination) *BOWEL PROBLEMS (unusual diarrhea, constipation, pain near the anus) TENDERNESS IN MOUTH AND THROAT WITH OR WITHOUT PRESENCE OF ULCERS (sore throat, sores in mouth, or a toothache) UNUSUAL RASH, SWELLING OR PAIN  UNUSUAL VAGINAL DISCHARGE OR ITCHING   Items with * indicate a potential emergency and should be followed up as soon as possible or go to the Emergency Department if any problems should occur.  Please show the CHEMOTHERAPY ALERT CARD or IMMUNOTHERAPY ALERT CARD at check-in to the Emergency Department and triage nurse.  Should you have questions after your visit or need to cancel or reschedule your appointment, please contact [facility] [phone]  and follow the prompts.  Office hours are 8:00 a.m. to 4:30 p.m. Monday - Friday. Please note that voicemails left after 4:00 p.m. may not be returned until the following business day.  We are closed weekends and major holidays. You have access to a  nurse at all times for urgent questions. Please call the main number to the clinic [phone] and follow the prompts.  For any non-urgent questions, you may also contact your provider using MyChart. We now offer e-Visits for anyone 41 and older to request care online for non-urgent symptoms. For details visit [email or web address].   Also download the MyChart app! Go to the app store, search "MyChart", open the app, select [facility], and log in with your MyChart username and password.  Masks are optional in the cancer centers. If you would like for your care team to wear a mask while they are taking care  of you, please let them know. For doctor visits, patients may have with them one support person who is at least 74 years old. At this time, visitors are not allowed in the infusion area.

## 2022-07-10 NOTE — Progress Notes (Signed)
Changing to Procrit 40,000 units due to Retacrit shortage.  No Josem Kaufmann is required for the change.  Henreitta Leber, PharmD

## 2022-07-10 NOTE — Progress Notes (Signed)
Patient presents today for Retacrit injection. HGB 8.5. Patient's blood pressure elevated on arrival. Patient states he has not taken any of his blood pressure medication this morning due to he has not ate breakfast. Patient denies headache, blurred vision or dizziness. No complaints today. Patient states when he leaves here he plans to eat breakfast and take blood pressure medications.   Roger Hansen presents today for injection per the provider's orders.  Epogen 40,000 units administration without incident; injection site WNL; see MAR for injection details.  Patient tolerated procedure well and without incident.  No questions or complaints noted at this time.

## 2022-07-16 ENCOUNTER — Other Ambulatory Visit: Payer: Self-pay | Admitting: *Deleted

## 2022-07-16 DIAGNOSIS — N183 Chronic kidney disease, stage 3 unspecified: Secondary | ICD-10-CM

## 2022-07-16 DIAGNOSIS — R5383 Other fatigue: Secondary | ICD-10-CM

## 2022-07-16 DIAGNOSIS — E7800 Pure hypercholesterolemia, unspecified: Secondary | ICD-10-CM

## 2022-07-17 ENCOUNTER — Inpatient Hospital Stay: Payer: Medicare Other | Attending: Hematology

## 2022-07-17 ENCOUNTER — Inpatient Hospital Stay: Payer: Medicare Other

## 2022-07-17 VITALS — BP 155/70 | HR 60 | Temp 98.7°F | Resp 18

## 2022-07-17 DIAGNOSIS — N183 Chronic kidney disease, stage 3 unspecified: Secondary | ICD-10-CM | POA: Diagnosis not present

## 2022-07-17 DIAGNOSIS — Z79899 Other long term (current) drug therapy: Secondary | ICD-10-CM | POA: Diagnosis not present

## 2022-07-17 DIAGNOSIS — R5383 Other fatigue: Secondary | ICD-10-CM

## 2022-07-17 DIAGNOSIS — D631 Anemia in chronic kidney disease: Secondary | ICD-10-CM | POA: Insufficient documentation

## 2022-07-17 DIAGNOSIS — D500 Iron deficiency anemia secondary to blood loss (chronic): Secondary | ICD-10-CM

## 2022-07-17 DIAGNOSIS — D509 Iron deficiency anemia, unspecified: Secondary | ICD-10-CM | POA: Diagnosis not present

## 2022-07-17 DIAGNOSIS — E611 Iron deficiency: Secondary | ICD-10-CM | POA: Insufficient documentation

## 2022-07-17 DIAGNOSIS — E538 Deficiency of other specified B group vitamins: Secondary | ICD-10-CM | POA: Insufficient documentation

## 2022-07-17 DIAGNOSIS — D649 Anemia, unspecified: Secondary | ICD-10-CM

## 2022-07-17 DIAGNOSIS — D696 Thrombocytopenia, unspecified: Secondary | ICD-10-CM

## 2022-07-17 DIAGNOSIS — E7800 Pure hypercholesterolemia, unspecified: Secondary | ICD-10-CM

## 2022-07-17 LAB — COMPREHENSIVE METABOLIC PANEL
ALT: 9 U/L (ref 0–44)
AST: 14 U/L — ABNORMAL LOW (ref 15–41)
Albumin: 3.4 g/dL — ABNORMAL LOW (ref 3.5–5.0)
Alkaline Phosphatase: 60 U/L (ref 38–126)
Anion gap: 5 (ref 5–15)
BUN: 9 mg/dL (ref 8–23)
CO2: 27 mmol/L (ref 22–32)
Calcium: 9 mg/dL (ref 8.9–10.3)
Chloride: 106 mmol/L (ref 98–111)
Creatinine, Ser: 0.88 mg/dL (ref 0.61–1.24)
GFR, Estimated: >60 mL/min (ref >60)
Glucose, Bld: 90 mg/dL (ref 70–99)
Potassium: 4.5 mmol/L (ref 3.5–5.1)
Sodium: 138 mmol/L (ref 135–145)
Total Bilirubin: 0.8 mg/dL (ref 0.3–1.2)
Total Protein: 6.9 g/dL (ref 6.5–8.1)

## 2022-07-17 LAB — CBC
HCT: 30.5 % — ABNORMAL LOW (ref 39.0–52.0)
Hemoglobin: 9.4 g/dL — ABNORMAL LOW (ref 13.0–17.0)
MCH: 27.6 pg (ref 26.0–34.0)
MCHC: 30.8 g/dL (ref 30.0–36.0)
MCV: 89.4 fL (ref 80.0–100.0)
Platelets: 317 10*3/uL (ref 150–400)
RBC: 3.41 MIL/uL — ABNORMAL LOW (ref 4.22–5.81)
RDW: 18.4 % — ABNORMAL HIGH (ref 11.5–15.5)
WBC: 4.4 10*3/uL (ref 4.0–10.5)
nRBC: 0 % (ref 0.0–0.2)

## 2022-07-17 LAB — LIPID PANEL
Cholesterol: 160 mg/dL (ref 0–200)
HDL: 67 mg/dL (ref >40)
LDL Cholesterol: 80 mg/dL (ref 0–99)
Total CHOL/HDL Ratio: 2.4 RATIO
Triglycerides: 66 mg/dL (ref <150)
VLDL: 13 mg/dL (ref 0–40)

## 2022-07-17 LAB — TSH: TSH: 3.413 u[IU]/mL (ref 0.350–4.500)

## 2022-07-17 MED ORDER — EPOETIN ALFA-EPBX 40000 UNIT/ML IJ SOLN: 40000.0000 [IU] | Freq: Once | INTRAMUSCULAR | Status: DC

## 2022-07-17 MED ORDER — EPOETIN ALFA 40000 UNIT/ML IJ SOLN
40000.0000 [IU] | Freq: Once | INTRAMUSCULAR | Status: AC
  Administered 2022-07-17: 40000 [IU] via SUBCUTANEOUS
  Filled 2022-07-17: qty 1

## 2022-07-17 NOTE — Progress Notes (Signed)
Changing to Procrit 40,000 units due to Retacrit shortage.  No Josem Kaufmann is required for the change  Henreitta Leber, PharmD

## 2022-07-17 NOTE — Progress Notes (Signed)
Patient presents today for injection. Hemoglobin reviewed prior to administration. VSS. Injection tolerated without incident or complaint. See MAR for details. Patient stable during and after injection.  Patient discharged in satisfactory condition with no s/s of distress noted.

## 2022-07-17 NOTE — Patient Instructions (Signed)
[facility]  Discharge Instructions: Thank you for choosing [facility] to provide your oncology and hematology care.  If you have a lab appointment with the [facility], please come in thru the Main Entrance and check in at the main information desk.  Wear comfortable clothing and clothing appropriate for easy access to any Portacath or PICC line.   We strive to give you quality time with your provider. You may need to reschedule your appointment if you arrive late (15 or more minutes).  Arriving late affects you and other patients whose appointments are after yours.  Also, if you miss three or more appointments without notifying the office, you may be dismissed from the clinic at the provider's discretion.      For prescription refill requests, have your pharmacy contact our office and allow 72 hours for refills to be completed.    Today you received the following chemotherapy and/or immunotherapy agents retacrit Epoetin Alfa Injection What is this medication? EPOETIN ALFA (e POE e tin AL fa) treats low levels of red blood cells (anemia) caused by kidney disease, chemotherapy, or HIV medications. It can also be used in people who are at risk for blood loss during surgery. It works by Building control surveyor make more red blood cells, which reduces the need for blood transfusions. This medicine may be used for other purposes; ask your health care provider or pharmacist if you have questions. COMMON BRAND NAME(S): Epogen, Procrit, Retacrit What should I tell my care team before I take this medication? They need to know if you have any of these conditions: Blood clots Cancer Heart disease High blood pressure On dialysis Seizures Stroke An unusual or allergic reaction to epoetin alfa, albumin, benzyl alcohol, other medications, foods, dyes, or preservatives Pregnant or trying to get pregnant Breast-feeding How should I use this medication? This medication is  injected into a vein or under the skin. It is usually given by your care team in a hospital or clinic setting. It may also be given at home. If you get this medication at home, you will be taught how to prepare and give it. Use exactly as directed. Take it as directed on the prescription label at the same time every day. Keep taking it unless your care team tells you to stop. It is important that you put your used needles and syringes in a special sharps container. Do not put them in a trash can. If you do not have a sharps container, call your pharmacist or care team to get one. A special MedGuide will be given to you by the pharmacist with each prescription and refill. Be sure to read this information carefully each time. Talk to your care team about the use of this medication in children. While this medication may be used in children as young as 41 month of age for selected conditions, precautions do apply. Overdosage: If you think you have taken too much of this medicine contact a poison control center or emergency room at once. NOTE: This medicine is only for you. Do not share this medicine with others. What if I miss a dose? If you miss a dose, take it as soon as you can. If it is almost time for your next dose, take only that dose. Do not take double or extra doses. What may interact with this medication? Darbepoetin alfa Methoxy polyethylene glycol-epoetin beta This list may not describe all possible interactions. Give your health care provider a  list of all the medicines, herbs, non-prescription drugs, or dietary supplements you use. Also tell them if you smoke, drink alcohol, or use illegal drugs. Some items may interact with your medicine. What should I watch for while using this medication? Visit your care team for regular checks on your progress. Check your blood pressure as directed. Know what your blood pressure should be and when to contact your care team. Your condition will be  monitored carefully while you are receiving this medication. You may need blood work while taking this medication. What side effects may I notice from receiving this medication? Side effects that you should report to your care team as soon as possible: Allergic reactions--skin rash, itching, hives, swelling of the face, lips, tongue, or throat Blood clot--pain, swelling, or warmth in the leg, shortness of breath, chest pain Heart attack--pain or tightness in the chest, shoulders, arms, or jaw, nausea, shortness of breath, cold or clammy skin, feeling faint or lightheaded Increase in blood pressure Rash, fever, and swollen lymph nodes Redness, blistering, peeling, or loosening of the skin, including inside the mouth Seizures Stroke--sudden numbness or weakness of the face, arm, or leg, trouble speaking, confusion, trouble walking, loss of balance or coordination, dizziness, severe headache, change in vision Side effects that usually do not require medical attention (report to your care team if they continue or are bothersome): Bone, joint, or muscle pain Cough Headache Nausea Pain, redness, or irritation at injection site This list may not describe all possible side effects. Call your doctor for medical advice about side effects. You may report side effects to FDA at 1-800-FDA-1088. Where should I keep my medication? Keep out of the reach of children and pets. Store in a refrigerator. Do not freeze. Do not shake. Protect from light. Keep this medication in the original container until you are ready to take it. See product for storage information. Get rid of any unused medication after the expiration date. To get rid of medications that are no longer needed or have expired: Take the medication to a medication take-back program. Check with your pharmacy or law enforcement to find a location. If you cannot return the medication, ask your pharmacist or care team how to get rid of the medication  safely. NOTE: This sheet is a summary. It may not cover all possible information. If you have questions about this medicine, talk to your doctor, pharmacist, or health care provider.  2023 Elsevier/Gold Standard (2022-03-13 00:00:00)       To help prevent nausea and vomiting after your treatment, we encourage you to take your nausea medication as directed.  BELOW ARE SYMPTOMS THAT SHOULD BE REPORTED IMMEDIATELY: *FEVER GREATER THAN 100.4 F (38 C) OR HIGHER *CHILLS OR SWEATING *NAUSEA AND VOMITING THAT IS NOT CONTROLLED WITH YOUR NAUSEA MEDICATION *UNUSUAL SHORTNESS OF BREATH *UNUSUAL BRUISING OR BLEEDING *URINARY PROBLEMS (pain or burning when urinating, or frequent urination) *BOWEL PROBLEMS (unusual diarrhea, constipation, pain near the anus) TENDERNESS IN MOUTH AND THROAT WITH OR WITHOUT PRESENCE OF ULCERS (sore throat, sores in mouth, or a toothache) UNUSUAL RASH, SWELLING OR PAIN  UNUSUAL VAGINAL DISCHARGE OR ITCHING   Items with * indicate a potential emergency and should be followed up as soon as possible or go to the Emergency Department if any problems should occur.  Please show the CHEMOTHERAPY ALERT CARD or IMMUNOTHERAPY ALERT CARD at check-in to the Emergency Department and triage nurse.  Should you have questions after your visit or need to cancel or reschedule your  appointment, please contact [facility] [phone]  and follow the prompts.  Office hours are 8:00 a.m. to 4:30 p.m. Monday - Friday. Please note that voicemails left after 4:00 p.m. may not be returned until the following business day.  We are closed weekends and major holidays. You have access to a nurse at all times for urgent questions. Please call the main number to the clinic [phone] and follow the prompts.  For any non-urgent questions, you may also contact your provider using MyChart. We now offer e-Visits for anyone 34 and older to request care online for non-urgent symptoms.  For details visit [email or web address].   Also download the MyChart app! Go to the app store, search "MyChart", open the app, select [facility], and log in with your MyChart username and password.  Masks are optional in the cancer centers. If you would like for your care team to wear a mask while they are taking care of you, please let them know. For doctor visits, patients may have with them one support person who is at least 74 years old. At this time, visitors are not allowed in the infusion area.

## 2022-07-21 DIAGNOSIS — I426 Alcoholic cardiomyopathy: Secondary | ICD-10-CM | POA: Diagnosis not present

## 2022-07-21 DIAGNOSIS — E875 Hyperkalemia: Secondary | ICD-10-CM | POA: Diagnosis not present

## 2022-07-21 DIAGNOSIS — D6489 Other specified anemias: Secondary | ICD-10-CM | POA: Diagnosis not present

## 2022-07-21 DIAGNOSIS — Z0001 Encounter for general adult medical examination with abnormal findings: Secondary | ICD-10-CM | POA: Diagnosis not present

## 2022-07-21 DIAGNOSIS — M19071 Primary osteoarthritis, right ankle and foot: Secondary | ICD-10-CM | POA: Diagnosis not present

## 2022-07-21 DIAGNOSIS — I10 Essential (primary) hypertension: Secondary | ICD-10-CM | POA: Diagnosis not present

## 2022-07-21 DIAGNOSIS — R152 Fecal urgency: Secondary | ICD-10-CM | POA: Diagnosis not present

## 2022-07-21 DIAGNOSIS — R197 Diarrhea, unspecified: Secondary | ICD-10-CM | POA: Diagnosis not present

## 2022-07-21 DIAGNOSIS — E876 Hypokalemia: Secondary | ICD-10-CM | POA: Diagnosis not present

## 2022-07-21 DIAGNOSIS — Z6832 Body mass index (BMI) 32.0-32.9, adult: Secondary | ICD-10-CM | POA: Diagnosis not present

## 2022-07-21 DIAGNOSIS — E7849 Other hyperlipidemia: Secondary | ICD-10-CM | POA: Diagnosis not present

## 2022-07-24 ENCOUNTER — Inpatient Hospital Stay: Payer: Medicare Other

## 2022-07-24 VITALS — BP 112/64 | HR 63 | Temp 98.5°F | Resp 18

## 2022-07-24 DIAGNOSIS — D500 Iron deficiency anemia secondary to blood loss (chronic): Secondary | ICD-10-CM

## 2022-07-24 DIAGNOSIS — D509 Iron deficiency anemia, unspecified: Secondary | ICD-10-CM | POA: Diagnosis not present

## 2022-07-24 DIAGNOSIS — Z79899 Other long term (current) drug therapy: Secondary | ICD-10-CM | POA: Diagnosis not present

## 2022-07-24 DIAGNOSIS — D696 Thrombocytopenia, unspecified: Secondary | ICD-10-CM

## 2022-07-24 DIAGNOSIS — N183 Chronic kidney disease, stage 3 unspecified: Secondary | ICD-10-CM

## 2022-07-24 DIAGNOSIS — D631 Anemia in chronic kidney disease: Secondary | ICD-10-CM | POA: Diagnosis not present

## 2022-07-24 DIAGNOSIS — D649 Anemia, unspecified: Secondary | ICD-10-CM

## 2022-07-24 DIAGNOSIS — E538 Deficiency of other specified B group vitamins: Secondary | ICD-10-CM | POA: Diagnosis not present

## 2022-07-24 LAB — CBC
HCT: 35.1 % — ABNORMAL LOW (ref 39.0–52.0)
Hemoglobin: 10.8 g/dL — ABNORMAL LOW (ref 13.0–17.0)
MCH: 27.1 pg (ref 26.0–34.0)
MCHC: 30.8 g/dL (ref 30.0–36.0)
MCV: 88.2 fL (ref 80.0–100.0)
Platelets: 326 10*3/uL (ref 150–400)
RBC: 3.98 MIL/uL — ABNORMAL LOW (ref 4.22–5.81)
RDW: 18.5 % — ABNORMAL HIGH (ref 11.5–15.5)
WBC: 4.9 10*3/uL (ref 4.0–10.5)
nRBC: 0 % (ref 0.0–0.2)

## 2022-07-24 MED ORDER — EPOETIN ALFA-EPBX 40000 UNIT/ML IJ SOLN: 40000.0000 [IU] | Freq: Once | INTRAMUSCULAR | Status: DC

## 2022-07-24 MED ORDER — CYANOCOBALAMIN 1000 MCG/ML IJ SOLN
1000.0000 ug | Freq: Once | INTRAMUSCULAR | Status: AC
  Administered 2022-07-24: 1000 ug via INTRAMUSCULAR
  Filled 2022-07-24: qty 1

## 2022-07-24 MED ORDER — EPOETIN ALFA 40000 UNIT/ML IJ SOLN
40000.0000 [IU] | Freq: Once | INTRAMUSCULAR | Status: AC
  Administered 2022-07-24: 40000 [IU] via SUBCUTANEOUS
  Filled 2022-07-24: qty 1

## 2022-07-24 NOTE — Progress Notes (Signed)
Patient tolerated injection with no complaints voiced.  Site clean and dry with no bruising or swelling noted at site.  See MAR for details.  Band aid applied.  Patient stable during and after injection.  Vss with discharge and left in satisfactory condition with no s/s of distress noted.  

## 2022-07-24 NOTE — Patient Instructions (Signed)
[facility]  Discharge Instructions: Thank you for choosing [facility] to provide your oncology and hematology care.  If you have a lab appointment with the Cancer Center, please come in thru the Main Entrance and check in at the main information desk.  Wear comfortable clothing and clothing appropriate for easy access to any Portacath or PICC line.   We strive to give you quality time with your provider. You may need to reschedule your appointment if you arrive late (15 or more minutes).  Arriving late affects you and other patients whose appointments are after yours.  Also, if you miss three or more appointments without notifying the office, you may be dismissed from the clinic at the provider's discretion.      For prescription refill requests, have your pharmacy contact our office and allow 72 hours for refills to be completed.      To help prevent nausea and vomiting after your treatment, we encourage you to take your nausea medication as directed.  BELOW ARE SYMPTOMS THAT SHOULD BE REPORTED IMMEDIATELY: *FEVER GREATER THAN 100.4 F (38 C) OR HIGHER *CHILLS OR SWEATING *NAUSEA AND VOMITING THAT IS NOT CONTROLLED WITH YOUR NAUSEA MEDICATION *UNUSUAL SHORTNESS OF BREATH *UNUSUAL BRUISING OR BLEEDING *URINARY PROBLEMS (pain or burning when urinating, or frequent urination) *BOWEL PROBLEMS (unusual diarrhea, constipation, pain near the anus) TENDERNESS IN MOUTH AND THROAT WITH OR WITHOUT PRESENCE OF ULCERS (sore throat, sores in mouth, or a toothache) UNUSUAL RASH, SWELLING OR PAIN  UNUSUAL VAGINAL DISCHARGE OR ITCHING   Items with * indicate a potential emergency and should be followed up as soon as possible or go to the Emergency Department if any problems should occur.  Please show the CHEMOTHERAPY ALERT CARD or IMMUNOTHERAPY ALERT CARD at check-in to the Emergency Department and triage nurse.  Should you have questions after your visit or need to  cancel or reschedule your appointment, please contact [facility] [phone]  and follow the prompts.  Office hours are 8:00 a.m. to 4:30 p.m. Monday - Friday. Please note that voicemails left after 4:00 p.m. may not be returned until the following business day.  We are closed weekends and major holidays. You have access to a nurse at all times for urgent questions. Please call the main number to the clinic [phone] and follow the prompts.  For any non-urgent questions, you may also contact your provider using MyChart. We now offer e-Visits for anyone 18 and older to request care online for non-urgent symptoms. For details visit [email or web address].   Also download the MyChart app! Go to the app store, search "MyChart", open the app, select [facility], and log in with your MyChart username and password.  Masks are optional in the cancer centers. If you would like for your care team to wear a mask while they are taking care of you, please let them know. For doctor visits, patients may have with them one support person who is at least 74 years old. At this time, visitors are not allowed in the infusion area.  

## 2022-07-31 ENCOUNTER — Inpatient Hospital Stay: Payer: Medicare Other

## 2022-07-31 VITALS — BP 134/77 | HR 68 | Temp 98.1°F | Resp 20

## 2022-07-31 DIAGNOSIS — D509 Iron deficiency anemia, unspecified: Secondary | ICD-10-CM | POA: Diagnosis not present

## 2022-07-31 DIAGNOSIS — N183 Chronic kidney disease, stage 3 unspecified: Secondary | ICD-10-CM

## 2022-07-31 DIAGNOSIS — Z79899 Other long term (current) drug therapy: Secondary | ICD-10-CM | POA: Diagnosis not present

## 2022-07-31 DIAGNOSIS — D631 Anemia in chronic kidney disease: Secondary | ICD-10-CM | POA: Diagnosis not present

## 2022-07-31 DIAGNOSIS — D696 Thrombocytopenia, unspecified: Secondary | ICD-10-CM

## 2022-07-31 DIAGNOSIS — D500 Iron deficiency anemia secondary to blood loss (chronic): Secondary | ICD-10-CM

## 2022-07-31 DIAGNOSIS — D649 Anemia, unspecified: Secondary | ICD-10-CM

## 2022-07-31 DIAGNOSIS — E538 Deficiency of other specified B group vitamins: Secondary | ICD-10-CM | POA: Diagnosis not present

## 2022-07-31 LAB — CBC
HCT: 36 % — ABNORMAL LOW (ref 39.0–52.0)
Hemoglobin: 10.9 g/dL — ABNORMAL LOW (ref 13.0–17.0)
MCH: 26.7 pg (ref 26.0–34.0)
MCHC: 30.3 g/dL (ref 30.0–36.0)
MCV: 88.2 fL (ref 80.0–100.0)
Platelets: 309 10*3/uL (ref 150–400)
RBC: 4.08 MIL/uL — ABNORMAL LOW (ref 4.22–5.81)
RDW: 18.6 % — ABNORMAL HIGH (ref 11.5–15.5)
WBC: 4.3 10*3/uL (ref 4.0–10.5)
nRBC: 0 % (ref 0.0–0.2)

## 2022-07-31 MED ORDER — EPOETIN ALFA-EPBX 40000 UNIT/ML IJ SOLN: 40000.0000 [IU] | Freq: Once | INTRAMUSCULAR | Status: DC

## 2022-07-31 MED ORDER — EPOETIN ALFA-EPBX 40000 UNIT/ML IJ SOLN
INTRAMUSCULAR | Status: AC
  Filled 2022-07-31: qty 1

## 2022-07-31 MED ORDER — EPOETIN ALFA 40000 UNIT/ML IJ SOLN
40000.0000 [IU] | Freq: Once | INTRAMUSCULAR | Status: AC
  Administered 2022-07-31: 40000 [IU] via SUBCUTANEOUS
  Filled 2022-07-31: qty 1

## 2022-07-31 NOTE — Patient Instructions (Signed)
[facility]  Discharge Instructions: Thank you for choosing [facility] to provide your oncology and hematology care.  If you have a lab appointment with the Cancer Center, please come in thru the Main Entrance and check in at the main information desk.  Wear comfortable clothing and clothing appropriate for easy access to any Portacath or PICC line.   We strive to give you quality time with your provider. You may need to reschedule your appointment if you arrive late (15 or more minutes).  Arriving late affects you and other patients whose appointments are after yours.  Also, if you miss three or more appointments without notifying the office, you may be dismissed from the clinic at the provider's discretion.      For prescription refill requests, have your pharmacy contact our office and allow 72 hours for refills to be completed.    Today you received the following chemotherapy and/or immunotherapy agents Epogen      To help prevent nausea and vomiting after your treatment, we encourage you to take your nausea medication as directed.  BELOW ARE SYMPTOMS THAT SHOULD BE REPORTED IMMEDIATELY: *FEVER GREATER THAN 100.4 F (38 C) OR HIGHER *CHILLS OR SWEATING *NAUSEA AND VOMITING THAT IS NOT CONTROLLED WITH YOUR NAUSEA MEDICATION *UNUSUAL SHORTNESS OF BREATH *UNUSUAL BRUISING OR BLEEDING *URINARY PROBLEMS (pain or burning when urinating, or frequent urination) *BOWEL PROBLEMS (unusual diarrhea, constipation, pain near the anus) TENDERNESS IN MOUTH AND THROAT WITH OR WITHOUT PRESENCE OF ULCERS (sore throat, sores in mouth, or a toothache) UNUSUAL RASH, SWELLING OR PAIN  UNUSUAL VAGINAL DISCHARGE OR ITCHING   Items with * indicate a potential emergency and should be followed up as soon as possible or go to the Emergency Department if any problems should occur.  Please show the CHEMOTHERAPY ALERT CARD or IMMUNOTHERAPY ALERT CARD at check-in to the Emergency  Department and triage nurse.  Should you have questions after your visit or need to cancel or reschedule your appointment, please contact [facility] [phone]  and follow the prompts.  Office hours are 8:00 a.m. to 4:30 p.m. Monday - Friday. Please note that voicemails left after 4:00 p.m. may not be returned until the following business day.  We are closed weekends and major holidays. You have access to a nurse at all times for urgent questions. Please call the main number to the clinic [phone] and follow the prompts.  For any non-urgent questions, you may also contact your provider using MyChart. We now offer e-Visits for anyone 18 and older to request care online for non-urgent symptoms. For details visit [email or web address].   Also download the MyChart app! Go to the app store, search "MyChart", open the app, select [facility], and log in with your MyChart username and password.  Masks are optional in the cancer centers. If you would like for your care team to wear a mask while they are taking care of you, please let them know. You may have one support person who is at least 74 years old accompany you for your appointments.  

## 2022-07-31 NOTE — Progress Notes (Signed)
Patient presents today for Epogen injection per providers order.  Vital signs WNL.  Hgb noted to be 10.9.  Patient has no new complaints at this time.  Epogen administration without incident; injection site WNL; see MAR for injection details.  Patient tolerated procedure well and without incident.  No questions or complaints noted at this time.

## 2022-08-07 ENCOUNTER — Inpatient Hospital Stay: Payer: Medicare Other

## 2022-08-07 DIAGNOSIS — D509 Iron deficiency anemia, unspecified: Secondary | ICD-10-CM | POA: Diagnosis not present

## 2022-08-07 DIAGNOSIS — D696 Thrombocytopenia, unspecified: Secondary | ICD-10-CM

## 2022-08-07 DIAGNOSIS — E538 Deficiency of other specified B group vitamins: Secondary | ICD-10-CM | POA: Diagnosis not present

## 2022-08-07 DIAGNOSIS — N183 Chronic kidney disease, stage 3 unspecified: Secondary | ICD-10-CM | POA: Diagnosis not present

## 2022-08-07 DIAGNOSIS — D631 Anemia in chronic kidney disease: Secondary | ICD-10-CM | POA: Diagnosis not present

## 2022-08-07 DIAGNOSIS — Z79899 Other long term (current) drug therapy: Secondary | ICD-10-CM | POA: Diagnosis not present

## 2022-08-07 DIAGNOSIS — D500 Iron deficiency anemia secondary to blood loss (chronic): Secondary | ICD-10-CM

## 2022-08-07 LAB — CBC
HCT: 38.5 % — ABNORMAL LOW (ref 39.0–52.0)
Hemoglobin: 11.5 g/dL — ABNORMAL LOW (ref 13.0–17.0)
MCH: 26.4 pg (ref 26.0–34.0)
MCHC: 29.9 g/dL — ABNORMAL LOW (ref 30.0–36.0)
MCV: 88.5 fL (ref 80.0–100.0)
Platelets: 281 10*3/uL (ref 150–400)
RBC: 4.35 MIL/uL (ref 4.22–5.81)
RDW: 18.6 % — ABNORMAL HIGH (ref 11.5–15.5)
WBC: 4.5 10*3/uL (ref 4.0–10.5)
nRBC: 0 % (ref 0.0–0.2)

## 2022-08-07 NOTE — Progress Notes (Signed)
Hgb 11.5 today.  No injection needed. No complaints voiced and no s/s of distress noted.

## 2022-08-14 ENCOUNTER — Inpatient Hospital Stay: Payer: Medicare Other

## 2022-08-14 DIAGNOSIS — D509 Iron deficiency anemia, unspecified: Secondary | ICD-10-CM | POA: Diagnosis not present

## 2022-08-14 DIAGNOSIS — D696 Thrombocytopenia, unspecified: Secondary | ICD-10-CM

## 2022-08-14 DIAGNOSIS — D631 Anemia in chronic kidney disease: Secondary | ICD-10-CM | POA: Diagnosis not present

## 2022-08-14 DIAGNOSIS — E538 Deficiency of other specified B group vitamins: Secondary | ICD-10-CM | POA: Diagnosis not present

## 2022-08-14 DIAGNOSIS — Z79899 Other long term (current) drug therapy: Secondary | ICD-10-CM | POA: Diagnosis not present

## 2022-08-14 DIAGNOSIS — D500 Iron deficiency anemia secondary to blood loss (chronic): Secondary | ICD-10-CM

## 2022-08-14 DIAGNOSIS — N183 Chronic kidney disease, stage 3 unspecified: Secondary | ICD-10-CM | POA: Diagnosis not present

## 2022-08-14 LAB — CBC
HCT: 38.2 % — ABNORMAL LOW (ref 39.0–52.0)
Hemoglobin: 11.6 g/dL — ABNORMAL LOW (ref 13.0–17.0)
MCH: 26.2 pg (ref 26.0–34.0)
MCHC: 30.4 g/dL (ref 30.0–36.0)
MCV: 86.4 fL (ref 80.0–100.0)
Platelets: 234 10*3/uL (ref 150–400)
RBC: 4.42 MIL/uL (ref 4.22–5.81)
RDW: 18.1 % — ABNORMAL HIGH (ref 11.5–15.5)
WBC: 4 10*3/uL (ref 4.0–10.5)
nRBC: 0 % (ref 0.0–0.2)

## 2022-08-14 NOTE — Progress Notes (Signed)
Patient presents today for Retacrit injection per providers order.  No Retacrit injection per parameters for Hgb of 11.6.

## 2022-08-21 ENCOUNTER — Inpatient Hospital Stay: Payer: Medicare Other

## 2022-08-21 ENCOUNTER — Inpatient Hospital Stay: Payer: Medicare Other | Attending: Hematology

## 2022-08-21 VITALS — BP 121/62 | HR 61 | Temp 98.0°F | Resp 18

## 2022-08-21 DIAGNOSIS — D696 Thrombocytopenia, unspecified: Secondary | ICD-10-CM

## 2022-08-21 DIAGNOSIS — D649 Anemia, unspecified: Secondary | ICD-10-CM

## 2022-08-21 DIAGNOSIS — D631 Anemia in chronic kidney disease: Secondary | ICD-10-CM | POA: Insufficient documentation

## 2022-08-21 DIAGNOSIS — D500 Iron deficiency anemia secondary to blood loss (chronic): Secondary | ICD-10-CM

## 2022-08-21 DIAGNOSIS — E538 Deficiency of other specified B group vitamins: Secondary | ICD-10-CM | POA: Diagnosis not present

## 2022-08-21 DIAGNOSIS — N183 Chronic kidney disease, stage 3 unspecified: Secondary | ICD-10-CM

## 2022-08-21 DIAGNOSIS — I129 Hypertensive chronic kidney disease with stage 1 through stage 4 chronic kidney disease, or unspecified chronic kidney disease: Secondary | ICD-10-CM | POA: Insufficient documentation

## 2022-08-21 DIAGNOSIS — N182 Chronic kidney disease, stage 2 (mild): Secondary | ICD-10-CM | POA: Insufficient documentation

## 2022-08-21 LAB — CBC
HCT: 38 % — ABNORMAL LOW (ref 39.0–52.0)
Hemoglobin: 11.6 g/dL — ABNORMAL LOW (ref 13.0–17.0)
MCH: 26.1 pg (ref 26.0–34.0)
MCHC: 30.5 g/dL (ref 30.0–36.0)
MCV: 85.6 fL (ref 80.0–100.0)
Platelets: 218 10*3/uL (ref 150–400)
RBC: 4.44 MIL/uL (ref 4.22–5.81)
RDW: 17.4 % — ABNORMAL HIGH (ref 11.5–15.5)
WBC: 4.6 10*3/uL (ref 4.0–10.5)
nRBC: 0 % (ref 0.0–0.2)

## 2022-08-21 MED ORDER — CYANOCOBALAMIN 1000 MCG/ML IJ SOLN
1000.0000 ug | Freq: Once | INTRAMUSCULAR | Status: AC
  Administered 2022-08-21: 1000 ug via INTRAMUSCULAR
  Filled 2022-08-21: qty 1

## 2022-08-21 NOTE — Patient Instructions (Signed)
[facility]  Discharge Instructions: Thank you for choosing [facility] to provide your oncology and hematology care.  If you have a lab appointment with the Cancer Center, please come in thru the Main Entrance and check in at the main information desk.  Wear comfortable clothing and clothing appropriate for easy access to any Portacath or PICC line.   We strive to give you quality time with your provider. You may need to reschedule your appointment if you arrive late (15 or more minutes).  Arriving late affects you and other patients whose appointments are after yours.  Also, if you miss three or more appointments without notifying the office, you may be dismissed from the clinic at the provider's discretion.      For prescription refill requests, have your pharmacy contact our office and allow 72 hours for refills to be completed.    Today you received the following B12 injection, return as scheduled.   To help prevent nausea and vomiting after your treatment, we encourage you to take your nausea medication as directed.  BELOW ARE SYMPTOMS THAT SHOULD BE REPORTED IMMEDIATELY: *FEVER GREATER THAN 100.4 F (38 C) OR HIGHER *CHILLS OR SWEATING *NAUSEA AND VOMITING THAT IS NOT CONTROLLED WITH YOUR NAUSEA MEDICATION *UNUSUAL SHORTNESS OF BREATH *UNUSUAL BRUISING OR BLEEDING *URINARY PROBLEMS (pain or burning when urinating, or frequent urination) *BOWEL PROBLEMS (unusual diarrhea, constipation, pain near the anus) TENDERNESS IN MOUTH AND THROAT WITH OR WITHOUT PRESENCE OF ULCERS (sore throat, sores in mouth, or a toothache) UNUSUAL RASH, SWELLING OR PAIN  UNUSUAL VAGINAL DISCHARGE OR ITCHING   Items with * indicate a potential emergency and should be followed up as soon as possible or go to the Emergency Department if any problems should occur.  Please show the CHEMOTHERAPY ALERT CARD or IMMUNOTHERAPY ALERT CARD at check-in to the Emergency Department and  triage nurse.  Should you have questions after your visit or need to cancel or reschedule your appointment, please contact [facility] [phone]  and follow the prompts.  Office hours are 8:00 a.m. to 4:30 p.m. Monday - Friday. Please note that voicemails left after 4:00 p.m. may not be returned until the following business day.  We are closed weekends and major holidays. You have access to a nurse at all times for urgent questions. Please call the main number to the clinic [phone] and follow the prompts.  For any non-urgent questions, you may also contact your provider using MyChart. We now offer e-Visits for anyone 18 and older to request care online for non-urgent symptoms. For details visit [email or web address].   Also download the MyChart app! Go to the app store, search "MyChart", open the app, select [facility], and log in with your MyChart username and password.  Masks are optional in the cancer centers. If you would like for your care team to wear a mask while they are taking care of you, please let them know. You may have one support person who is at least 74 years old accompany you for your appointments.  

## 2022-08-21 NOTE — Progress Notes (Signed)
Patient present's today for Retacrit and B12 injections. Hemoglobin is 11.6 no Retacrit needed today per parameters.  Patient tolerated B12 injection with no complaints voiced. Site clean and dry with no bruising or swelling noted at site. See MAR for details. Band aid applied.  Patient stable during and after injection. VSS with discharge and left in satisfactory condition with no s/s of distress noted.

## 2022-08-28 ENCOUNTER — Inpatient Hospital Stay: Payer: Medicare Other

## 2022-08-28 DIAGNOSIS — D696 Thrombocytopenia, unspecified: Secondary | ICD-10-CM

## 2022-08-28 DIAGNOSIS — D631 Anemia in chronic kidney disease: Secondary | ICD-10-CM | POA: Diagnosis not present

## 2022-08-28 DIAGNOSIS — I129 Hypertensive chronic kidney disease with stage 1 through stage 4 chronic kidney disease, or unspecified chronic kidney disease: Secondary | ICD-10-CM | POA: Diagnosis not present

## 2022-08-28 DIAGNOSIS — N182 Chronic kidney disease, stage 2 (mild): Secondary | ICD-10-CM | POA: Diagnosis not present

## 2022-08-28 DIAGNOSIS — D500 Iron deficiency anemia secondary to blood loss (chronic): Secondary | ICD-10-CM

## 2022-08-28 DIAGNOSIS — E538 Deficiency of other specified B group vitamins: Secondary | ICD-10-CM | POA: Diagnosis not present

## 2022-08-28 LAB — CBC
HCT: 37.5 % — ABNORMAL LOW (ref 39.0–52.0)
Hemoglobin: 11.2 g/dL — ABNORMAL LOW (ref 13.0–17.0)
MCH: 25.4 pg — ABNORMAL LOW (ref 26.0–34.0)
MCHC: 29.9 g/dL — ABNORMAL LOW (ref 30.0–36.0)
MCV: 85 fL (ref 80.0–100.0)
Platelets: 185 10*3/uL (ref 150–400)
RBC: 4.41 MIL/uL (ref 4.22–5.81)
RDW: 17.1 % — ABNORMAL HIGH (ref 11.5–15.5)
WBC: 4.6 10*3/uL (ref 4.0–10.5)
nRBC: 0 % (ref 0.0–0.2)

## 2022-08-28 NOTE — Progress Notes (Signed)
Hemoglobin today is 11.2.  We will hold Retacrit today per parameters.  Patient notified.  Patient left ambulatory in stable condition.

## 2022-09-04 ENCOUNTER — Inpatient Hospital Stay: Payer: Medicare Other

## 2022-09-11 ENCOUNTER — Inpatient Hospital Stay: Payer: Medicare Other

## 2022-09-18 ENCOUNTER — Inpatient Hospital Stay: Payer: Medicare Other

## 2022-09-18 ENCOUNTER — Inpatient Hospital Stay: Payer: Medicare Other | Attending: Hematology

## 2022-09-18 DIAGNOSIS — E538 Deficiency of other specified B group vitamins: Secondary | ICD-10-CM | POA: Insufficient documentation

## 2022-09-18 DIAGNOSIS — I129 Hypertensive chronic kidney disease with stage 1 through stage 4 chronic kidney disease, or unspecified chronic kidney disease: Secondary | ICD-10-CM | POA: Insufficient documentation

## 2022-09-18 DIAGNOSIS — E611 Iron deficiency: Secondary | ICD-10-CM | POA: Insufficient documentation

## 2022-09-18 DIAGNOSIS — D631 Anemia in chronic kidney disease: Secondary | ICD-10-CM | POA: Insufficient documentation

## 2022-09-18 DIAGNOSIS — N182 Chronic kidney disease, stage 2 (mild): Secondary | ICD-10-CM | POA: Insufficient documentation

## 2022-09-18 DIAGNOSIS — D509 Iron deficiency anemia, unspecified: Secondary | ICD-10-CM | POA: Insufficient documentation

## 2022-09-20 DIAGNOSIS — R0789 Other chest pain: Secondary | ICD-10-CM | POA: Diagnosis not present

## 2022-09-20 DIAGNOSIS — R079 Chest pain, unspecified: Secondary | ICD-10-CM | POA: Diagnosis not present

## 2022-09-20 DIAGNOSIS — R109 Unspecified abdominal pain: Secondary | ICD-10-CM | POA: Diagnosis not present

## 2022-09-20 DIAGNOSIS — N281 Cyst of kidney, acquired: Secondary | ICD-10-CM | POA: Diagnosis not present

## 2022-09-20 DIAGNOSIS — K8020 Calculus of gallbladder without cholecystitis without obstruction: Secondary | ICD-10-CM | POA: Diagnosis not present

## 2022-09-20 DIAGNOSIS — D649 Anemia, unspecified: Secondary | ICD-10-CM | POA: Diagnosis not present

## 2022-09-22 ENCOUNTER — Telehealth (INDEPENDENT_AMBULATORY_CARE_PROVIDER_SITE_OTHER): Payer: Self-pay

## 2022-09-22 ENCOUNTER — Ambulatory Visit (INDEPENDENT_AMBULATORY_CARE_PROVIDER_SITE_OTHER): Payer: Medicare Other | Admitting: Gastroenterology

## 2022-09-22 ENCOUNTER — Encounter (INDEPENDENT_AMBULATORY_CARE_PROVIDER_SITE_OTHER): Payer: Self-pay | Admitting: Gastroenterology

## 2022-09-22 ENCOUNTER — Encounter (INDEPENDENT_AMBULATORY_CARE_PROVIDER_SITE_OTHER): Payer: Self-pay

## 2022-09-22 ENCOUNTER — Other Ambulatory Visit (INDEPENDENT_AMBULATORY_CARE_PROVIDER_SITE_OTHER): Payer: Self-pay

## 2022-09-22 VITALS — BP 129/78 | HR 71 | Temp 98.7°F | Ht 65.0 in | Wt 198.7 lb

## 2022-09-22 DIAGNOSIS — R109 Unspecified abdominal pain: Secondary | ICD-10-CM | POA: Diagnosis not present

## 2022-09-22 DIAGNOSIS — R112 Nausea with vomiting, unspecified: Secondary | ICD-10-CM

## 2022-09-22 DIAGNOSIS — R195 Other fecal abnormalities: Secondary | ICD-10-CM | POA: Diagnosis not present

## 2022-09-22 DIAGNOSIS — Z86010 Personal history of colonic polyps: Secondary | ICD-10-CM

## 2022-09-22 DIAGNOSIS — R1310 Dysphagia, unspecified: Secondary | ICD-10-CM

## 2022-09-22 DIAGNOSIS — D649 Anemia, unspecified: Secondary | ICD-10-CM

## 2022-09-22 DIAGNOSIS — D500 Iron deficiency anemia secondary to blood loss (chronic): Secondary | ICD-10-CM

## 2022-09-22 DIAGNOSIS — R110 Nausea: Secondary | ICD-10-CM

## 2022-09-22 DIAGNOSIS — R194 Change in bowel habit: Secondary | ICD-10-CM

## 2022-09-22 DIAGNOSIS — R197 Diarrhea, unspecified: Secondary | ICD-10-CM

## 2022-09-22 MED ORDER — OMEPRAZOLE 40 MG PO CPDR: 40.0000 mg | DELAYED_RELEASE_CAPSULE | Freq: Every day | ORAL | 1 refills | Status: DC

## 2022-09-22 MED ORDER — PEG 3350-KCL-NA BICARB-NACL 420 G PO SOLR: 4000.0000 mL | ORAL | 0 refills | Status: DC

## 2022-09-22 NOTE — Telephone Encounter (Signed)
Ermalinda Joubert Ann Vence Lalor, CMA  ?

## 2022-09-22 NOTE — Patient Instructions (Signed)
I have sent prescription for omeprazole '40mg'$  daily as you are having breakthrough reflux symptoms We will schedule you for EGD for your swallowing issues and nausea and colonoscopy due to worsening anemia and change in bowel habits Please avoid thicker, drier foods and chew thoroughly to avoid choking Make sure to get back on track with your injections for your blood counts Please limit alcohol intake as this can cause potential detrimental health effects for you.  Follow up 3 months

## 2022-09-22 NOTE — Progress Notes (Unsigned)
Referring Provider: Curlene Labrum, MD Primary Care Physician:  Curlene Labrum, MD Primary GI Physician: new  Chief Complaint  Patient presents with   Diarrhea    New patient. Referred for diarrhea and loose stools.  Having lower abdominal pain. Reports his stools go back and forth between hard and loose.    HPI:   Roger Hansen is a 76 y.o. male with past medical history of alcohol abuse, anemia, cardiomyopathy, CKD stage 3, GERD, colon polyps, High cholesterol, HTN.   Patient presenting today as a new patient for diarrhea and lower abdominal pain.  Notably per chart review, chronic normocytic anemia since around 2014 Last hemoglobin 8.4 on 09/21/22 Ferritin 667 in June with Iron 52, TIBC 166, sat 31 (maintained on PO iron) MMA 287 and B12 620 in May  Seeing oncology for ongoing anemia, at last visit increasing retacrit to 30k units weekly, considering BMB if continued anemia, maintained on B12 injections   Negative stool culture and H pylori stool antigen in June 2023.  Present:  patient states he was previously having diarrhea for about 3 weeks when his appt was initially made. He is now having looser to harder stools. Having a BM every day to every other day. Sometimes when loose stools occur he may have a few episodes of this. Recently having more constipation with the need to strain a lot to have a BM. He is taking stool softener daily with good results. He endorse mid to lower abdominal pain that occurs almost daily, having a BM sometimes improves his abdominal pain. He notes some lower back pain at times of worse constipation as well. He denies BRB in stools. Does note darker stools on his PO iron supplements. Denies weight loss. Appetite comes and goes, states sometimes he is very hungry and other times not so much. States if he eats late in the evening and lays down this will cause abdominal pain, however, if he eats early in the day and moves around afterwards he does not  have pain. Denies any changes in medications or antibiotics prior to diarrhea starting.   He notes some nausea in the mornings upon waking, this has been present for some times. He notes issues with heartburn, takes omeprazole '20mg'$  daily with 2-3 episodes of breakthrough symptoms per week. Notes he sometimes cuts back to PRN dosing if symptoms are well controlled. He notes some dysphagia with foods sticking just above sternal notch. Only has issues with foods, this occurs almost daily. Usually can get food to pass with drinking liquids.   Notes that he drinks liquor, sometimes th-Sunday,maybe two weekends out of the month then may go a month or more without alcohol. Notes he dose not drink daily. On average may consume 2 fifths of liquor over the course of a weekend.   Does note that he had hospitalization this past weekend for ongoing chest soreness. EKG negative, CT A/P negative for acute abnormalities. Notably hgb dropped to 7.5 on 10/8, back up to 8.4 on 10/8.  he notes that he missed his past few appts for his retacrit (maybe the last 3) and "partied a little" for his birthday at the end of last month though would not elaborate on details of this.    NSAID use: none Social hx: no tobacco, frequent ETOH Fam hx: no CRC, liver disease or pancreatic cancer   Last Colonoscopy:06/03/19 - Four 3 to 7 mm polyps in the transverse colon, at the hepatic flexure and in the  ascending colon, (tubular adenomas) external hemorrhoids Last Endoscopy:06/03/19- Normal proximal esophagus and distal esophagus. - LA Grade A esophagitis. Biopsied-negative - Z-line regular, 40 cm from the incisors. - Normal stomach - Duodenitis.  Recommendations:  Repeat colonoscopy 5 years   Past Medical History:  Diagnosis Date   Alcohol abuse 11/24/2016   Anemia 10/11/2013   Cardiomyopathy ([place])    a. EF 50% by echo in 2019 with NST showing no reversible ischemia and thought to be alcohol-induced   Chronic renal disease,  stage 3, moderately decreased glomerular filtration rate (GFR) between 30-59 mL/min/1.73 square meter (HCC) 12/02/2016   Colon polyps    GERD (gastroesophageal reflux disease)    High cholesterol    Hypertension    Sinus infection     Past Surgical History:  Procedure Laterality Date   BIOPSY  06/03/2019   Procedure: BIOPSY;  Surgeon: Rogene Houston, MD;  [facility];  Service: Endoscopy;;  esophagus   COLONOSCOPY N/A 10/26/2013   Procedure: COLONOSCOPY;  Surgeon: Rogene Houston, MD;  [facility]: [facility];  Service: Endoscopy;  Laterality: N/A;  325-moved to 1230 Ann to notify pt   Colonoscopy with polypectomy     COLONOSCOPY WITH PROPOFOL N/A 06/03/2019   Procedure: COLONOSCOPY WITH PROPOFOL;  Surgeon: Rogene Houston, MD;  [facility]: [facility];  Service: Endoscopy;  Laterality: N/A;   ESOPHAGOGASTRODUODENOSCOPY (EGD) WITH PROPOFOL N/A 06/03/2019   Procedure: ESOPHAGOGASTRODUODENOSCOPY (EGD) WITH PROPOFOL;  Surgeon: Rogene Houston, MD;  [facility]: [facility];  Service: Endoscopy;  Laterality: N/A;   POLYPECTOMY  06/03/2019   Procedure: POLYPECTOMY;  Surgeon: Rogene Houston, MD;  [facility];  Service: Endoscopy;;  colon    Current Outpatient Medications  Medication Sig Dispense Refill   ALPRAZolam (XANAX) 1 MG tablet      aspirin 81 MG tablet Take 1 tablet (81 mg total) by mouth daily. 30 tablet    atorvastatin (LIPITOR) 10 MG tablet Take 10 mg by mouth daily.     buPROPion (WELLBUTRIN SR) 150 MG 12 hr tablet Take 150 mg by mouth daily.      buPROPion (WELLBUTRIN XL) 150 MG 24 hr tablet Take by mouth.     chlorhexidine (PERIDEX) 0.12 % solution      cholecalciferol (VITAMIN D) 1000 UNITS tablet Take 1,000 Units by mouth 2 (two) times daily.     donepezil (ARICEPT) 10 MG tablet Take 10 mg by mouth at bedtime.     Eszopiclone 3 MG TABS Take 3 mg by mouth at bedtime.      Ferrous Sulfate (IRON) 325 (65 Fe) MG TABS TAKE ONE TABLET BY MOUTH DAILY WITH  BREAKFAST. (Patient taking differently: 1 tablet 2 (two) times a day.) 30 tablet 3   folic acid (FOLVITE) 1 MG tablet Take 1 mg by mouth daily.     furosemide (LASIX) 20 MG tablet Take 20 mg by mouth daily.      gabapentin (NEURONTIN) 300 MG capsule Take 300 mg by mouth 3 (three) times daily as needed.     GNP VITAMIN B-1 100 MG tablet Take 100 mg by mouth daily.      losartan (COZAAR) 50 MG tablet Take 50 mg by mouth daily.     metoprolol succinate (TOPROL XL) 25 MG 24 hr tablet Take 1 tablet (25 mg total) by mouth daily. 90 tablet 3   naproxen (NAPROSYN) 500 MG tablet Take by mouth.     nitroGLYCERIN (NITROSTAT) 0.4 MG SL tablet  Place 1 tablet (0.4 mg total) under the tongue every 5 (five) minutes as needed for chest pain. 25 tablet 3   omeprazole (PRILOSEC) 20 MG capsule Take 20 mg by mouth daily as needed (Acid Reflux).     potassium chloride SA (K-DUR) 20 MEQ tablet Take 20 mEq by mouth daily.      QUEtiapine (SEROQUEL) 100 MG tablet      sodium polystyrene (KAYEXALATE) 15 GM/60ML suspension Take 60 mLs (15 g total) by mouth as directed. Take 15 g (60 mL) at noon today.  Take 15 g (60 mL) at 4 PM today.  We will call you tomorrow after labs to let you know if you need to take any additional doses. 240 mL 0   tamsulosin (FLOMAX) 0.4 MG CAPS capsule 0.4 mg daily.     traZODone (DESYREL) 100 MG tablet Take 100 mg by mouth at bedtime.     vitamin B-12 (CYANOCOBALAMIN) 1000 MCG tablet Take 1,000 mcg by mouth daily.     ziprasidone (GEODON) 40 MG capsule Take 40 mg by mouth 2 (two) times daily with a meal.      magnesium oxide (MAG-OX) 400 (240 Mg) MG tablet Take 1 tablet (400 mg total) by mouth 2 (two) times daily. (Patient not taking: Reported on 09/22/2022) 10 tablet 0   omeprazole (PRILOSEC) 40 MG capsule Take by mouth. (Patient not taking: Reported on 09/22/2022)     No current facility-administered medications for this visit.    Allergies as of 09/22/2022 - Review Complete 09/22/2022   Allergen Reaction Noted   Horseradish [armoracia rusticana ext (horseradish)] Anaphylaxis 10/16/2016    Family History  Problem Relation Age of Onset   Hypertension Mother    Colon cancer Neg Hx     Social History   Socioeconomic History   Marital status: Married    Spouse name: Not on file   Number of children: Not on file   Years of education: Not on file   Highest education level: Not on file  Occupational History   Not on file  Tobacco Use   Smoking status: Former    Packs/day: 0.50    Years: 20.00    Total pack years: 10.00    Types: Cigarettes    Quit date: 12/27/1971    Years since quitting: 50.7    Passive exposure: Past   Smokeless tobacco: Never  Vaping Use   Vaping Use: Never used  Substance and Sexual Activity   Alcohol use: Yes    Comment: 1 pint of etoh a day ( not recently)   Drug use: No   Sexual activity: Not on file  Other Topics Concern   Not on file  Social History Narrative   Not on file   Social Determinants of Health   Financial Resource Strain: Not on file  Food Insecurity: No Food Insecurity (11/19/2020)   Hunger Vital Sign    Worried About Running Out of Food in the Last Year: Never true    Ran Out of Food in the Last Year: Never true  Transportation Needs: No Transportation Needs (11/19/2020)   PRAPARE - Hydrologist (Medical): No    Lack of Transportation (Non-Medical): No  Physical Activity: Inactive (11/19/2020)   Exercise Vital Sign    Days of Exercise per Week: 0 days    Minutes of Exercise per Session: 0 min  Stress: No Stress Concern Present (11/19/2020)   [place]  Feeling of Stress : Not at all  Social Connections: Not on file    Review of systems General: negative for malaise, night sweats, fever, chills, weight los Neck: Negative for lumps, goiter, pain and significant neck swelling Resp: Negative for cough, wheezing,  dyspnea at rest CV: Negative for chest pain, leg swelling, palpitations, orthopnea GI: denies melena, hematochezia, nausea, vomiting, diarrhea, constipation, dysphagia, odyonophagia, early satiety or unintentional weight loss.  MSK: Negative for joint pain or swelling, back pain, and muscle pain. Derm: Negative for itching or rash Psych: Denies depression, anxiety, memory loss, confusion. No homicidal or suicidal ideation.  Heme: Negative for prolonged bleeding, bruising easily, and swollen nodes. Endocrine: Negative for cold or heat intolerance, polyuria, polydipsia and goiter. Neuro: negative for tremor, gait imbalance, syncope and seizures. The remainder of the review of systems is noncontributory.  Physical Exam: BP 129/78 (BP Location: Left Arm, Patient Position: Sitting, Cuff Size: Large)   Pulse 71   Temp 98.7 F (37.1 C) (Oral)   Ht '5\' 5"'$  (1.651 m)   Wt 198 lb 11.2 oz (90.1 kg)   BMI 33.07 kg/m  General:   Alert and oriented. No distress noted. Pleasant and cooperative.  Head:  Normocephalic and atraumatic. Eyes:  Conjuctiva clear without scleral icterus. Mouth:  Oral mucosa pink and moist. Good dentition. No lesions. Heart: Normal rate and rhythm, s1 and s2 heart sounds present.  Lungs: Clear lung sounds in all lobes. Respirations equal and unlabored. Abdomen:  +BS, soft, non-tender and non-distended. No rebound or guarding. No HSM or masses noted. Derm: No palmar erythema or jaundice Msk:  Symmetrical without gross deformities. Normal posture. Extremities:  Without edema. Neurologic:  Alert and  oriented x4 Psych:  Alert and cooperative. Normal mood and affect.  Invalid input(s): "6 MONTHS"   ASSESSMENT: Roger Hansen is a 76 y.o. male presenting today    PLAN:  Omeprazole '40mg'$  daily  2. EGD/colonoscopy small bowel biopsies  3. Limit alcohol intake  4. Follow up with hematology/ resume Retacrit injections   Follow Up: ***  Jakhi Dishman L. Alver Sorrow, MSN, APRN,  AGNP-C Adult-Gerontology Nurse Practitioner [facility]

## 2022-09-23 DIAGNOSIS — R109 Unspecified abdominal pain: Secondary | ICD-10-CM | POA: Insufficient documentation

## 2022-09-23 DIAGNOSIS — R1310 Dysphagia, unspecified: Secondary | ICD-10-CM | POA: Insufficient documentation

## 2022-09-23 DIAGNOSIS — R110 Nausea: Secondary | ICD-10-CM | POA: Insufficient documentation

## 2022-09-23 DIAGNOSIS — R112 Nausea with vomiting, unspecified: Secondary | ICD-10-CM | POA: Insufficient documentation

## 2022-09-23 DIAGNOSIS — R197 Diarrhea, unspecified: Secondary | ICD-10-CM | POA: Insufficient documentation

## 2022-09-24 NOTE — Progress Notes (Deleted)
NO SHOW

## 2022-09-24 NOTE — Patient Instructions (Signed)
Roger Hansen  09/24/2022     '@[email or web address]'$ @   Your procedure is scheduled on  10/02/2022.   Report to [facility] at  0600  A.M.   Call this number if you have problems the morning of surgery:  [phone]   Remember:  Follow the diet and prep instructions given to you by the office.    Take these medicines the morning of surgery with A SIP OF WATER             xanax(if needed), wellbutrin, gabapentin, omeprazole, seroquel, flomax.     Do not wear jewelry, make-up or nail polish.  Do not wear lotions, powders, or perfumes, or deodorant.  Do not shave 48 hours prior to surgery.  Men may shave face and neck.  Do not bring valuables to the hospital.  [facility] is not responsible for any belongings or valuables.  Contacts, dentures or bridgework may not be worn into surgery.  Leave your suitcase in the car.  After surgery it may be brought to your room.  For patients admitted to the hospital, discharge time will be determined by your treatment team.  Patients discharged the day of surgery will not be allowed to drive home and must have someone with them for 24 hours.    Special instructions:   DO NOT smoke tobacco or vape for 24 hours before your procedure.  Please read over the following fact sheets that you were given. Anesthesia Post-op Instructions and Care and Recovery After Surgery      Upper Endoscopy, Adult, Care After After the procedure, it is common to have a sore throat. It is also common to have: Mild stomach pain or discomfort. Bloating. Nausea. Follow these instructions at home: The instructions below may help you care for yourself at home. Your health care provider may give you more instructions. If you have questions, ask your health care provider. If you were given a sedative during the procedure, it can affect you for several hours. Do not drive or operate machinery until your health care provider says that it is safe. If  you will be going home right after the procedure, plan to have a responsible adult: Take you home from the hospital or clinic. You will not be allowed to drive. Care for you for the time you are told. Follow instructions from your health care provider about what you may eat and drink. Return to your normal activities as told by your health care provider. Ask your health care provider what activities are safe for you. Take over-the-counter and prescription medicines only as told by your health care provider. Contact a health care provider if you: Have a sore throat that lasts longer than one day. Have trouble swallowing. Have a fever. Get help right away if you: Vomit blood or your vomit looks like coffee grounds. Have bloody, black, or tarry stools. Have a very bad sore throat or you cannot swallow. Have difficulty breathing or very bad pain in your chest or abdomen. These symptoms may be an emergency. Get help right away. Call 911. Do not wait to see if the symptoms will go away. Do not drive yourself to the hospital. Summary After the procedure, it is common to have a sore throat, mild stomach discomfort, bloating, and nausea. If you were given a sedative during the procedure, it can affect you for several hours. Do not drive until your health care provider says that it  is safe. Follow instructions from your health care provider about what you may eat and drink. Return to your normal activities as told by your health care provider. This information is not intended to replace advice given to you by your health care provider. Make sure you discuss any questions you have with your health care provider. Document Revised: 03/12/2022 Document Reviewed: 03/12/2022 Elsevier Patient Education  [place]. Esophageal Dilatation Esophageal dilatation, also called esophageal dilation, is a procedure to widen or open a blocked or narrowed part of the esophagus. The esophagus is the part of  the body that moves food and liquid from the mouth to the stomach. You may need this procedure if: You have a buildup of scar tissue in your esophagus that makes it difficult, painful, or impossible to swallow. This can be caused by gastroesophageal reflux disease (GERD). You have cancer of the esophagus. There is a problem with how food moves through your esophagus. In some cases, you may need this procedure repeated at a later time to dilate the esophagus gradually. Tell a health care provider about: Any allergies you have. All medicines you are taking, including vitamins, herbs, eye drops, creams, and over-the-counter medicines. Any problems you or family members have had with anesthetic medicines. Any blood disorders you have. Any surgeries you have had. Any medical conditions you have. Any antibiotic medicines you are required to take before dental procedures. Whether you are pregnant or may be pregnant. What are the risks? Generally, this is a safe procedure. However, problems may occur, including: Bleeding due to a tear in the lining of the esophagus. A hole, or perforation, in the esophagus. What happens before the procedure? Ask your health care provider about: Changing or stopping your regular medicines. This is especially important if you are taking diabetes medicines or blood thinners. Taking medicines such as aspirin and ibuprofen. These medicines can thin your blood. Do not take these medicines unless your health care provider tells you to take them. Taking over-the-counter medicines, vitamins, herbs, and supplements. Follow instructions from your health care provider about eating or drinking restrictions. Plan to have a responsible adult take you home from the hospital or clinic. Plan to have a responsible adult care for you for the time you are told after you leave the hospital or clinic. This is important. What happens during the procedure? You may be given a medicine to  help you relax (sedative). A numbing medicine may be sprayed into the back of your throat, or you may gargle the medicine. Your health care provider may perform the dilatation using various surgical instruments, such as: Simple dilators. This instrument is carefully placed in the esophagus to stretch it. Guided wire bougies. This involves using an endoscope to insert a wire into the esophagus. A dilator is passed over this wire to enlarge the esophagus. Then the wire is removed. Balloon dilators. An endoscope with a small balloon is inserted into the esophagus. The balloon is inflated to stretch the esophagus and open it up. The procedure may vary among health care providers and hospitals. What can I expect after the procedure? Your blood pressure, heart rate, breathing rate, and blood oxygen level will be monitored until you leave the hospital or clinic. Your throat may feel slightly sore and numb. This will get better over time. You will not be allowed to eat or drink until your throat is no longer numb. When you are able to drink, urinate, and sit on the edge of the  bed without nausea or dizziness, you may be able to return home. Follow these instructions at home: Take over-the-counter and prescription medicines only as told by your health care provider. If you were given a sedative during the procedure, it can affect you for several hours. Do not drive or operate machinery until your health care provider says that it is safe. Plan to have a responsible adult care for you for the time you are told. This is important. Follow instructions from your health care provider about any eating or drinking restrictions. Do not use any products that contain nicotine or tobacco, such as cigarettes, e-cigarettes, and chewing tobacco. If you need help quitting, ask your health care provider. Keep all follow-up visits. This is important. Contact a health care provider if: You have a fever. You have pain that  is not relieved by medicine. Get help right away if: You have chest pain. You have trouble breathing. You have trouble swallowing. You vomit blood. You have black, tarry, or bloody stools. These symptoms may represent a serious problem that is an emergency. Do not wait to see if the symptoms will go away. Get medical help right away. Call your local emergency services (911 in the [place] Do not drive yourself to the hospital. Summary Esophageal dilatation, also called esophageal dilation, is a procedure to widen or open a blocked or narrowed part of the esophagus. Plan to have a responsible adult take you home from the hospital or clinic. For this procedure, a numbing medicine may be sprayed into the back of your throat, or you may gargle the medicine. Do not drive or operate machinery until your health care provider says that it is safe. This information is not intended to replace advice given to you by your health care provider. Make sure you discuss any questions you have with your health care provider. Document Revised: 04/18/2020 Document Reviewed: 04/18/2020 Elsevier Patient Education  [place]. Colonoscopy, Adult, Care After The following information offers guidance on how to care for yourself after your procedure. Your health care provider may also give you more specific instructions. If you have problems or questions, contact your health care provider. What can I expect after the procedure? After the procedure, it is common to have: A small amount of blood in your stool for 24 hours after the procedure. Some gas. Mild cramping or bloating of your abdomen. Follow these instructions at home: Eating and drinking  Drink enough fluid to keep your urine pale yellow. Follow instructions from your health care provider about eating or drinking restrictions. Resume your normal diet as told by your health care provider. Avoid heavy or fried foods that are hard to  digest. Activity Rest as told by your health care provider. Avoid sitting for a long time without moving. Get up to take short walks every 1-2 hours. This is important to improve blood flow and breathing. Ask for help if you feel weak or unsteady. Return to your normal activities as told by your health care provider. Ask your health care provider what activities are safe for you. Managing cramping and bloating  Try walking around when you have cramps or feel bloated. If directed, apply heat to your abdomen as told by your health care provider. Use the heat source that your health care provider recommends, such as a moist heat pack or a heating pad. Place a towel between your skin and the heat source. Leave the heat on for 20-30 minutes. Remove the heat if your  skin turns bright red. This is especially important if you are unable to feel pain, heat, or cold. You have a greater risk of getting burned. General instructions If you were given a sedative during the procedure, it can affect you for several hours. Do not drive or operate machinery until your health care provider says that it is safe. For the first 24 hours after the procedure: Do not sign important documents. Do not drink alcohol. Do your regular daily activities at a slower pace than normal. Eat soft foods that are easy to digest. Take over-the-counter and prescription medicines only as told by your health care provider. Keep all follow-up visits. This is important. Contact a health care provider if: You have blood in your stool 2-3 days after the procedure. Get help right away if: You have more than a small spotting of blood in your stool. You have large blood clots in your stool. You have swelling of your abdomen. You have nausea or vomiting. You have a fever. You have increasing pain in your abdomen that is not relieved with medicine. These symptoms may be an emergency. Get help right away. Call 911. Do not wait to see if  the symptoms will go away. Do not drive yourself to the hospital. Summary After the procedure, it is common to have a small amount of blood in your stool. You may also have mild cramping and bloating of your abdomen. If you were given a sedative during the procedure, it can affect you for several hours. Do not drive or operate machinery until your health care provider says that it is safe. Get help right away if you have a lot of blood in your stool, nausea or vomiting, a fever, or increased pain in your abdomen. This information is not intended to replace advice given to you by your health care provider. Make sure you discuss any questions you have with your health care provider. Document Revised: 07/24/2021 Document Reviewed: 07/24/2021 Elsevier Patient Education  [place] After This sheet gives you information about how to care for yourself after your procedure. Your health care provider may also give you more specific instructions. If you have problems or questions, contact your health care provider. What can I expect after the procedure? After the procedure, it is common to have: Tiredness. Forgetfulness about what happened after the procedure. Impaired judgment for important decisions. Nausea or vomiting. Some difficulty with balance. Follow these instructions at home: For the time period you were told by your health care provider:     Rest as needed. Do not participate in activities where you could fall or become injured. Do not drive or use machinery. Do not drink alcohol. Do not take sleeping pills or medicines that cause drowsiness. Do not make important decisions or sign legal documents. Do not take care of children on your own. Eating and drinking Follow the diet that is recommended by your health care provider. Drink enough fluid to keep your urine pale yellow. If you vomit: Drink water, juice, or soup when you can drink  without vomiting. Make sure you have little or no nausea before eating solid foods. General instructions Have a responsible adult stay with you for the time you are told. It is important to have someone help care for you until you are awake and alert. Take over-the-counter and prescription medicines only as told by your health care provider. If you have sleep apnea, surgery and certain medicines can increase your  risk for breathing problems. Follow instructions from your health care provider about wearing your sleep device: Anytime you are sleeping, including during daytime naps. While taking prescription pain medicines, sleeping medicines, or medicines that make you drowsy. Avoid smoking. Keep all follow-up visits as told by your health care provider. This is important. Contact a health care provider if: You keep feeling nauseous or you keep vomiting. You feel light-headed. You are still sleepy or having trouble with balance after 24 hours. You develop a rash. You have a fever. You have redness or swelling around the IV site. Get help right away if: You have trouble breathing. You have new-onset confusion at home. Summary For several hours after your procedure, you may feel tired. You may also be forgetful and have poor judgment. Have a responsible adult stay with you for the time you are told. It is important to have someone help care for you until you are awake and alert. Rest as told. Do not drive or operate machinery. Do not drink alcohol or take sleeping pills. Get help right away if you have trouble breathing, or if you suddenly become confused. This information is not intended to replace advice given to you by your health care provider. Make sure you discuss any questions you have with your health care provider. Document Revised: 11/05/2021 Document Reviewed: 11/03/2019 Elsevier Patient Education  [place].

## 2022-09-25 ENCOUNTER — Inpatient Hospital Stay: Payer: Medicare Other

## 2022-09-25 ENCOUNTER — Inpatient Hospital Stay: Payer: Medicare Other | Admitting: Physician Assistant

## 2022-09-26 ENCOUNTER — Inpatient Hospital Stay: Payer: Medicare Other

## 2022-09-26 VITALS — BP 147/70 | HR 57 | Temp 97.5°F | Resp 17

## 2022-09-26 DIAGNOSIS — D649 Anemia, unspecified: Secondary | ICD-10-CM

## 2022-09-26 DIAGNOSIS — D6489 Other specified anemias: Secondary | ICD-10-CM | POA: Diagnosis not present

## 2022-09-26 DIAGNOSIS — I426 Alcoholic cardiomyopathy: Secondary | ICD-10-CM | POA: Diagnosis not present

## 2022-09-26 DIAGNOSIS — N183 Chronic kidney disease, stage 3 unspecified: Secondary | ICD-10-CM

## 2022-09-26 DIAGNOSIS — R0789 Other chest pain: Secondary | ICD-10-CM | POA: Diagnosis not present

## 2022-09-26 DIAGNOSIS — I10 Essential (primary) hypertension: Secondary | ICD-10-CM | POA: Diagnosis not present

## 2022-09-26 DIAGNOSIS — D631 Anemia in chronic kidney disease: Secondary | ICD-10-CM | POA: Diagnosis not present

## 2022-09-26 DIAGNOSIS — E876 Hypokalemia: Secondary | ICD-10-CM | POA: Diagnosis not present

## 2022-09-26 DIAGNOSIS — D509 Iron deficiency anemia, unspecified: Secondary | ICD-10-CM | POA: Diagnosis not present

## 2022-09-26 DIAGNOSIS — N182 Chronic kidney disease, stage 2 (mild): Secondary | ICD-10-CM | POA: Diagnosis not present

## 2022-09-26 DIAGNOSIS — E538 Deficiency of other specified B group vitamins: Secondary | ICD-10-CM

## 2022-09-26 DIAGNOSIS — Z23 Encounter for immunization: Secondary | ICD-10-CM | POA: Diagnosis not present

## 2022-09-26 DIAGNOSIS — Z6834 Body mass index (BMI) 34.0-34.9, adult: Secondary | ICD-10-CM | POA: Diagnosis not present

## 2022-09-26 DIAGNOSIS — I129 Hypertensive chronic kidney disease with stage 1 through stage 4 chronic kidney disease, or unspecified chronic kidney disease: Secondary | ICD-10-CM | POA: Diagnosis not present

## 2022-09-26 DIAGNOSIS — E611 Iron deficiency: Secondary | ICD-10-CM | POA: Diagnosis not present

## 2022-09-26 LAB — VITAMIN B12: Vitamin B-12: 351 pg/mL (ref 180–914)

## 2022-09-26 LAB — COMPREHENSIVE METABOLIC PANEL
ALT: 16 U/L (ref 0–44)
AST: 18 U/L (ref 15–41)
Albumin: 3.4 g/dL — ABNORMAL LOW (ref 3.5–5.0)
Alkaline Phosphatase: 66 U/L (ref 38–126)
Anion gap: 7 (ref 5–15)
BUN: 10 mg/dL (ref 8–23)
CO2: 26 mmol/L (ref 22–32)
Calcium: 8.4 mg/dL — ABNORMAL LOW (ref 8.9–10.3)
Chloride: 108 mmol/L (ref 98–111)
Creatinine, Ser: 0.95 mg/dL (ref 0.61–1.24)
GFR, Estimated: >60 mL/min (ref >60)
Glucose, Bld: 106 mg/dL — ABNORMAL HIGH (ref 70–99)
Potassium: 3.9 mmol/L (ref 3.5–5.1)
Sodium: 141 mmol/L (ref 135–145)
Total Bilirubin: 0.8 mg/dL (ref 0.3–1.2)
Total Protein: 6.5 g/dL (ref 6.5–8.1)

## 2022-09-26 LAB — CBC WITH DIFFERENTIAL/PLATELET
Abs Immature Granulocytes: 0.02 10*3/uL (ref 0.00–0.07)
Basophils Absolute: 0 10*3/uL (ref 0.0–0.1)
Basophils Relative: 1 %
Eosinophils Absolute: 0.1 10*3/uL (ref 0.0–0.5)
Eosinophils Relative: 2 %
HCT: 26.3 % — ABNORMAL LOW (ref 39.0–52.0)
Hemoglobin: 8 g/dL — ABNORMAL LOW (ref 13.0–17.0)
Immature Granulocytes: 1 %
Lymphocytes Relative: 41 %
Lymphs Abs: 1.8 10*3/uL (ref 0.7–4.0)
MCH: 27 pg (ref 26.0–34.0)
MCHC: 30.4 g/dL (ref 30.0–36.0)
MCV: 88.9 fL (ref 80.0–100.0)
Monocytes Absolute: 0.6 10*3/uL (ref 0.1–1.0)
Monocytes Relative: 15 %
Neutro Abs: 1.7 10*3/uL (ref 1.7–7.7)
Neutrophils Relative %: 40 %
Platelets: 254 10*3/uL (ref 150–400)
RBC: 2.96 MIL/uL — ABNORMAL LOW (ref 4.22–5.81)
RDW: 21.4 % — ABNORMAL HIGH (ref 11.5–15.5)
WBC: 4.2 10*3/uL (ref 4.0–10.5)
nRBC: 0 % (ref 0.0–0.2)

## 2022-09-26 LAB — IRON AND TIBC
Iron: 55 ug/dL (ref 45–182)
Saturation Ratios: 24 % (ref 17.9–39.5)
TIBC: 225 ug/dL — ABNORMAL LOW (ref 250–450)
UIBC: 170 ug/dL

## 2022-09-26 LAB — FERRITIN: Ferritin: 526 ng/mL — ABNORMAL HIGH (ref 24–336)

## 2022-09-26 MED ORDER — EPOETIN ALFA 40000 UNIT/ML IJ SOLN
40000.0000 [IU] | Freq: Once | INTRAMUSCULAR | Status: AC
  Administered 2022-09-26: 40000 [IU] via SUBCUTANEOUS
  Filled 2022-09-26: qty 1

## 2022-09-26 MED ORDER — CYANOCOBALAMIN 1000 MCG/ML IJ SOLN
1000.0000 ug | Freq: Once | INTRAMUSCULAR | Status: AC
  Administered 2022-09-26: 1000 ug via INTRAMUSCULAR
  Filled 2022-09-26: qty 1

## 2022-09-26 NOTE — Progress Notes (Signed)
Epogen 40,000 units and Vitamin B12 given today per MD orders. Tolerated without adverse affects. Vital signs stable. No complaints at this time. Discharged from clinic ambulatory in stable condition. Alert and oriented x 3. F/U with [facility] as scheduled.

## 2022-09-26 NOTE — Patient Instructions (Addendum)
[facility]  Discharge Instructions: Thank you for choosing [facility] to provide your oncology and hematology care.  If you have a lab appointment with the [facility], please come in thru the Main Entrance and check in at the main information desk.  Wear comfortable clothing and clothing appropriate for easy access to any Portacath or PICC line.   We strive to give you quality time with your provider. You may need to reschedule your appointment if you arrive late (15 or more minutes).  Arriving late affects you and other patients whose appointments are after yours.  Also, if you miss three or more appointments without notifying the office, you may be dismissed from the clinic at the provider's discretion.      For prescription refill requests, have your pharmacy contact our office and allow 72 hours for refills to be completed.    Today you received the following chemotherapy and/or immunotherapy agents Vitamin B12 injection nd Epogen 40,000 units. Vitamin B12 Injection What is this medication? Vitamin B12 (VAHY tuh min B12) prevents and treats low vitamin B12 levels in your body. It is used in people who do not get enough vitamin B12 from their diet or when their digestive tract does not absorb enough. Vitamin B12 plays an important role in maintaining the health of your nervous system and red blood cells. This medicine may be used for other purposes; ask your health care provider or pharmacist if you have questions. COMMON BRAND NAME(S): B-12 Compliance Kit, B-12 Injection Kit, Cyomin, Dodex, LA-12, Nutri-Twelve, Physicians EZ Use B-12, Primabalt What should I tell my care team before I take this medication? They need to know if you have any of these conditions: Kidney disease Leber's disease Megaloblastic anemia An unusual or allergic reaction to cyanocobalamin, cobalt, other medications, foods, dyes, or preservatives Pregnant or trying to get  pregnant Breast-feeding How should I use this medication? This medication is injected into a muscle or deeply under the skin. It is usually given in a clinic or care team's office. However, your care team may teach you how to inject yourself. Follow all instructions. Talk to your care team about the use of this medication in children. Special care may be needed. Overdosage: If you think you have taken too much of this medicine contact a poison control center or emergency room at once. NOTE: This medicine is only for you. Do not share this medicine with others. What if I miss a dose? If you are given your dose at a clinic or care team's office, call to reschedule your appointment. If you give your own injections, and you miss a dose, take it as soon as you can. If it is almost time for your next dose, take only that dose. Do not take double or extra doses. What may interact with this medication? Alcohol Colchicine This list may not describe all possible interactions. Give your health care provider a list of all the medicines, herbs, non-prescription drugs, or dietary supplements you use. Also tell them if you smoke, drink alcohol, or use illegal drugs. Some items may interact with your medicine. What should I watch for while using this medication? Visit your care team regularly. You may need blood work done while you are taking this medication. You may need to follow a special diet. Talk to your care team. Limit your alcohol intake and avoid smoking to get the best benefit. What side effects may I notice from receiving this medication? Side  effects that you should report to your care team as soon as possible: Allergic reactions--skin rash, itching, hives, swelling of the face, lips, tongue, or throat Swelling of the ankles, hands, or feet Trouble breathing Side effects that usually do not require medical attention (report to your care team if they continue or are bothersome): Diarrhea This list  may not describe all possible side effects. Call your doctor for medical advice about side effects. You may report side effects to FDA at 1-800-FDA-1088. Where should I keep my medication? Keep out of the reach of children. Store at room temperature between 15 and 30 degrees C (59 and 85 degrees F). Protect from light. Throw away any unused medication after the expiration date. NOTE: This sheet is a summary. It may not cover all possible information. If you have questions about this medicine, talk to your doctor, pharmacist, or health care provider.  2023 Elsevier/Gold Standard (2021-02-07 00:00:00)  Epoetin Alfa Injection What is this medication? EPOETIN ALFA (e POE e tin AL fa) treats low levels of red blood cells (anemia) caused by kidney disease, chemotherapy, or HIV medications. It can also be used in people who are at risk for blood loss during surgery. It works by Building control surveyor make more red blood cells, which reduces the need for blood transfusions. This medicine may be used for other purposes; ask your health care provider or pharmacist if you have questions. COMMON BRAND NAME(S): Epogen, Procrit, Retacrit What should I tell my care team before I take this medication? They need to know if you have any of these conditions: Blood clots Cancer Heart disease High blood pressure On dialysis Seizures Stroke An unusual or allergic reaction to epoetin alfa, albumin, benzyl alcohol, other medications, foods, dyes, or preservatives Pregnant or trying to get pregnant Breast-feeding How should I use this medication? This medication is injected into a vein or under the skin. It is usually given by your care team in a hospital or clinic setting. It may also be given at home. If you get this medication at home, you will be taught how to prepare and give it. Use exactly as directed. Take it as directed on the prescription label at the same time every day. Keep taking it unless your care team  tells you to stop. It is important that you put your used needles and syringes in a special sharps container. Do not put them in a trash can. If you do not have a sharps container, call your pharmacist or care team to get one. A special MedGuide will be given to you by the pharmacist with each prescription and refill. Be sure to read this information carefully each time. Talk to your care team about the use of this medication in children. While this medication may be used in children as young as 22 month of age for selected conditions, precautions do apply. Overdosage: If you think you have taken too much of this medicine contact a poison control center or emergency room at once. NOTE: This medicine is only for you. Do not share this medicine with others. What if I miss a dose? If you miss a dose, take it as soon as you can. If it is almost time for your next dose, take only that dose. Do not take double or extra doses. What may interact with this medication? Darbepoetin alfa Methoxy polyethylene glycol-epoetin beta This list may not describe all possible interactions. Give your health care provider a list of all the medicines, herbs,  non-prescription drugs, or dietary supplements you use. Also tell them if you smoke, drink alcohol, or use illegal drugs. Some items may interact with your medicine. What should I watch for while using this medication? Visit your care team for regular checks on your progress. Check your blood pressure as directed. Know what your blood pressure should be and when to contact your care team. Your condition will be monitored carefully while you are receiving this medication. You may need blood work while taking this medication. What side effects may I notice from receiving this medication? Side effects that you should report to your care team as soon as possible: Allergic reactions--skin rash, itching, hives, swelling of the face, lips, tongue, or throat Blood clot--pain,  swelling, or warmth in the leg, shortness of breath, chest pain Heart attack--pain or tightness in the chest, shoulders, arms, or jaw, nausea, shortness of breath, cold or clammy skin, feeling faint or lightheaded Increase in blood pressure Rash, fever, and swollen lymph nodes Redness, blistering, peeling, or loosening of the skin, including inside the mouth Seizures Stroke--sudden numbness or weakness of the face, arm, or leg, trouble speaking, confusion, trouble walking, loss of balance or coordination, dizziness, severe headache, change in vision Side effects that usually do not require medical attention (report to your care team if they continue or are bothersome): Bone, joint, or muscle pain Cough Headache Nausea Pain, redness, or irritation at injection site This list may not describe all possible side effects. Call your doctor for medical advice about side effects. You may report side effects to FDA at 1-800-FDA-1088. Where should I keep my medication? Keep out of the reach of children and pets. Store in a refrigerator. Do not freeze. Do not shake. Protect from light. Keep this medication in the original container until you are ready to take it. See product for storage information. Get rid of any unused medication after the expiration date. To get rid of medications that are no longer needed or have expired: Take the medication to a medication take-back program. Check with your pharmacy or law enforcement to find a location. If you cannot return the medication, ask your pharmacist or care team how to get rid of the medication safely. NOTE: This sheet is a summary. It may not cover all possible information. If you have questions about this medicine, talk to your doctor, pharmacist, or health care provider.  2023 Elsevier/Gold Standard (2022-03-13 00:00:00)       To help prevent nausea and vomiting after your treatment, we encourage you to take your nausea medication as  directed.  BELOW ARE SYMPTOMS THAT SHOULD BE REPORTED IMMEDIATELY: *FEVER GREATER THAN 100.4 F (38 C) OR HIGHER *CHILLS OR SWEATING *NAUSEA AND VOMITING THAT IS NOT CONTROLLED WITH YOUR NAUSEA MEDICATION *UNUSUAL SHORTNESS OF BREATH *UNUSUAL BRUISING OR BLEEDING *URINARY PROBLEMS (pain or burning when urinating, or frequent urination) *BOWEL PROBLEMS (unusual diarrhea, constipation, pain near the anus) TENDERNESS IN MOUTH AND THROAT WITH OR WITHOUT PRESENCE OF ULCERS (sore throat, sores in mouth, or a toothache) UNUSUAL RASH, SWELLING OR PAIN  UNUSUAL VAGINAL DISCHARGE OR ITCHING   Items with * indicate a potential emergency and should be followed up as soon as possible or go to the Emergency Department if any problems should occur.  Please show the CHEMOTHERAPY ALERT CARD or IMMUNOTHERAPY ALERT CARD at check-in to the Emergency Department and triage nurse.  Should you have questions after your visit or need to cancel or reschedule your appointment, please contact [facility] [phone]  and follow the prompts.  Office hours are 8:00 a.m. to 4:30 p.m. Monday - Friday. Please note that voicemails left after 4:00 p.m. may not be returned until the following business day.  We are closed weekends and major holidays. You have access to a nurse at all times for urgent questions. Please call the main number to the clinic [phone] and follow the prompts.  For any non-urgent questions, you may also contact your provider using MyChart. We now offer e-Visits for anyone 78 and older to request care online for non-urgent symptoms. For details visit [email or web address].   Also download the MyChart app! Go to the app store, search "MyChart", open the app, select [facility], and log in with your MyChart username and password.  Masks are optional in the cancer centers. If you would like for your care team to wear a mask while they are taking care of you, please let them know.  You may have one support person who is at least 74 years old accompany you for your appointments.

## 2022-09-29 ENCOUNTER — Encounter (HOSPITAL_COMMUNITY)
Admission: RE | Admit: 2022-09-29 | Discharge: 2022-09-29 | Disposition: A | Discharge status: Home / Self Care | Payer: Medicare Other | Source: Ambulatory Visit | Attending: Gastroenterology | Admitting: Gastroenterology

## 2022-09-29 ENCOUNTER — Encounter (INDEPENDENT_AMBULATORY_CARE_PROVIDER_SITE_OTHER): Payer: Self-pay

## 2022-09-29 DIAGNOSIS — Z86010 Personal history of colonic polyps: Secondary | ICD-10-CM

## 2022-09-29 DIAGNOSIS — R194 Change in bowel habit: Secondary | ICD-10-CM

## 2022-09-29 DIAGNOSIS — R110 Nausea: Secondary | ICD-10-CM

## 2022-09-29 DIAGNOSIS — D649 Anemia, unspecified: Secondary | ICD-10-CM

## 2022-09-29 DIAGNOSIS — D500 Iron deficiency anemia secondary to blood loss (chronic): Secondary | ICD-10-CM

## 2022-09-29 DIAGNOSIS — R1310 Dysphagia, unspecified: Secondary | ICD-10-CM

## 2022-09-29 LAB — METHYLMALONIC ACID, SERUM: Methylmalonic Acid, Quantitative: 232 nmol/L (ref 0–378)

## 2022-09-29 NOTE — Progress Notes (Signed)
Patient wants to cancel his procedure for now.  He had a flu shot on Friday and hasn't been feeling well at all this week.

## 2022-10-03 ENCOUNTER — Other Ambulatory Visit: Payer: Self-pay

## 2022-10-03 ENCOUNTER — Inpatient Hospital Stay: Payer: Medicare Other

## 2022-10-03 VITALS — BP 138/77 | HR 62 | Temp 97.4°F | Resp 17

## 2022-10-03 DIAGNOSIS — I129 Hypertensive chronic kidney disease with stage 1 through stage 4 chronic kidney disease, or unspecified chronic kidney disease: Secondary | ICD-10-CM | POA: Diagnosis not present

## 2022-10-03 DIAGNOSIS — N183 Chronic kidney disease, stage 3 unspecified: Secondary | ICD-10-CM

## 2022-10-03 DIAGNOSIS — D509 Iron deficiency anemia, unspecified: Secondary | ICD-10-CM | POA: Diagnosis not present

## 2022-10-03 DIAGNOSIS — D649 Anemia, unspecified: Secondary | ICD-10-CM

## 2022-10-03 DIAGNOSIS — D631 Anemia in chronic kidney disease: Secondary | ICD-10-CM | POA: Diagnosis not present

## 2022-10-03 DIAGNOSIS — E538 Deficiency of other specified B group vitamins: Secondary | ICD-10-CM | POA: Diagnosis not present

## 2022-10-03 DIAGNOSIS — D500 Iron deficiency anemia secondary to blood loss (chronic): Secondary | ICD-10-CM

## 2022-10-03 DIAGNOSIS — D696 Thrombocytopenia, unspecified: Secondary | ICD-10-CM

## 2022-10-03 DIAGNOSIS — N182 Chronic kidney disease, stage 2 (mild): Secondary | ICD-10-CM | POA: Diagnosis not present

## 2022-10-03 LAB — COMPREHENSIVE METABOLIC PANEL
ALT: 9 U/L (ref 0–44)
AST: 13 U/L — ABNORMAL LOW (ref 15–41)
Albumin: 3.5 g/dL (ref 3.5–5.0)
Alkaline Phosphatase: 66 U/L (ref 38–126)
Anion gap: 7 (ref 5–15)
BUN: 11 mg/dL (ref 8–23)
CO2: 26 mmol/L (ref 22–32)
Calcium: 8.7 mg/dL — ABNORMAL LOW (ref 8.9–10.3)
Chloride: 105 mmol/L (ref 98–111)
Creatinine, Ser: 0.98 mg/dL (ref 0.61–1.24)
GFR, Estimated: >60 mL/min (ref >60)
Glucose, Bld: 95 mg/dL (ref 70–99)
Potassium: 4 mmol/L (ref 3.5–5.1)
Sodium: 138 mmol/L (ref 135–145)
Total Bilirubin: 0.6 mg/dL (ref 0.3–1.2)
Total Protein: 6.6 g/dL (ref 6.5–8.1)

## 2022-10-03 LAB — CBC
HCT: 27.6 % — ABNORMAL LOW (ref 39.0–52.0)
Hemoglobin: 8.4 g/dL — ABNORMAL LOW (ref 13.0–17.0)
MCH: 27.5 pg (ref 26.0–34.0)
MCHC: 30.4 g/dL (ref 30.0–36.0)
MCV: 90.2 fL (ref 80.0–100.0)
Platelets: 235 10*3/uL (ref 150–400)
RBC: 3.06 MIL/uL — ABNORMAL LOW (ref 4.22–5.81)
RDW: 22.7 % — ABNORMAL HIGH (ref 11.5–15.5)
WBC: 5.3 10*3/uL (ref 4.0–10.5)
nRBC: 0 % (ref 0.0–0.2)

## 2022-10-03 LAB — MAGNESIUM: Magnesium: 1.3 mg/dL — ABNORMAL LOW (ref 1.7–2.4)

## 2022-10-03 MED ORDER — EPOETIN ALFA 40000 UNIT/ML IJ SOLN
40000.0000 [IU] | Freq: Once | INTRAMUSCULAR | Status: AC
  Administered 2022-10-03: 40000 [IU] via SUBCUTANEOUS
  Filled 2022-10-03: qty 1

## 2022-10-03 NOTE — Patient Instructions (Signed)
[facility]  Discharge Instructions: Thank you for choosing [facility] to provide your oncology and hematology care.  If you have a lab appointment with the [facility], please come in thru the Main Entrance and check in at the main information desk.  Wear comfortable clothing and clothing appropriate for easy access to any Portacath or PICC line.   We strive to give you quality time with your provider. You may need to reschedule your appointment if you arrive late (15 or more minutes).  Arriving late affects you and other patients whose appointments are after yours.  Also, if you miss three or more appointments without notifying the office, you may be dismissed from the clinic at the provider's discretion.      For prescription refill requests, have your pharmacy contact our office and allow 72 hours for refills to be completed.    Today you received the following chemotherapy and/or immunotherapy agents of Procrit.  Epoetin Alfa Injection What is this medication? EPOETIN ALFA (e POE e tin AL fa) treats low levels of red blood cells (anemia) caused by kidney disease, chemotherapy, or HIV medications. It can also be used in people who are at risk for blood loss during surgery. It works by Building control surveyor make more red blood cells, which reduces the need for blood transfusions. This medicine may be used for other purposes; ask your health care provider or pharmacist if you have questions. COMMON BRAND NAME(S): Epogen, Procrit, Retacrit What should I tell my care team before I take this medication? They need to know if you have any of these conditions: Blood clots Cancer Heart disease High blood pressure On dialysis Seizures Stroke An unusual or allergic reaction to epoetin alfa, albumin, benzyl alcohol, other medications, foods, dyes, or preservatives Pregnant or trying to get pregnant Breast-feeding How should I use this medication? This  medication is injected into a vein or under the skin. It is usually given by your care team in a hospital or clinic setting. It may also be given at home. If you get this medication at home, you will be taught how to prepare and give it. Use exactly as directed. Take it as directed on the prescription label at the same time every day. Keep taking it unless your care team tells you to stop. It is important that you put your used needles and syringes in a special sharps container. Do not put them in a trash can. If you do not have a sharps container, call your pharmacist or care team to get one. A special MedGuide will be given to you by the pharmacist with each prescription and refill. Be sure to read this information carefully each time. Talk to your care team about the use of this medication in children. While this medication may be used in children as young as 75 month of age for selected conditions, precautions do apply. Overdosage: If you think you have taken too much of this medicine contact a poison control center or emergency room at once. NOTE: This medicine is only for you. Do not share this medicine with others. What if I miss a dose? If you miss a dose, take it as soon as you can. If it is almost time for your next dose, take only that dose. Do not take double or extra doses. What may interact with this medication? Darbepoetin alfa Methoxy polyethylene glycol-epoetin beta This list may not describe all possible interactions. Give your health care  provider a list of all the medicines, herbs, non-prescription drugs, or dietary supplements you use. Also tell them if you smoke, drink alcohol, or use illegal drugs. Some items may interact with your medicine. What should I watch for while using this medication? Visit your care team for regular checks on your progress. Check your blood pressure as directed. Know what your blood pressure should be and when to contact your care team. Your condition  will be monitored carefully while you are receiving this medication. You may need blood work while taking this medication. What side effects may I notice from receiving this medication? Side effects that you should report to your care team as soon as possible: Allergic reactions--skin rash, itching, hives, swelling of the face, lips, tongue, or throat Blood clot--pain, swelling, or warmth in the leg, shortness of breath, chest pain Heart attack--pain or tightness in the chest, shoulders, arms, or jaw, nausea, shortness of breath, cold or clammy skin, feeling faint or lightheaded Increase in blood pressure Rash, fever, and swollen lymph nodes Redness, blistering, peeling, or loosening of the skin, including inside the mouth Seizures Stroke--sudden numbness or weakness of the face, arm, or leg, trouble speaking, confusion, trouble walking, loss of balance or coordination, dizziness, severe headache, change in vision Side effects that usually do not require medical attention (report to your care team if they continue or are bothersome): Bone, joint, or muscle pain Cough Headache Nausea Pain, redness, or irritation at injection site This list may not describe all possible side effects. Call your doctor for medical advice about side effects. You may report side effects to FDA at 1-800-FDA-1088. Where should I keep my medication? Keep out of the reach of children and pets. Store in a refrigerator. Do not freeze. Do not shake. Protect from light. Keep this medication in the original container until you are ready to take it. See product for storage information. Get rid of any unused medication after the expiration date. To get rid of medications that are no longer needed or have expired: Take the medication to a medication take-back program. Check with your pharmacy or law enforcement to find a location. If you cannot return the medication, ask your pharmacist or care team how to get rid of the  medication safely. NOTE: This sheet is a summary. It may not cover all possible information. If you have questions about this medicine, talk to your doctor, pharmacist, or health care provider.  2023 Elsevier/Gold Standard (2022-03-13 00:00:00)        To help prevent nausea and vomiting after your treatment, we encourage you to take your nausea medication as directed.  BELOW ARE SYMPTOMS THAT SHOULD BE REPORTED IMMEDIATELY: *FEVER GREATER THAN 100.4 F (38 C) OR HIGHER *CHILLS OR SWEATING *NAUSEA AND VOMITING THAT IS NOT CONTROLLED WITH YOUR NAUSEA MEDICATION *UNUSUAL SHORTNESS OF BREATH *UNUSUAL BRUISING OR BLEEDING *URINARY PROBLEMS (pain or burning when urinating, or frequent urination) *BOWEL PROBLEMS (unusual diarrhea, constipation, pain near the anus) TENDERNESS IN MOUTH AND THROAT WITH OR WITHOUT PRESENCE OF ULCERS (sore throat, sores in mouth, or a toothache) UNUSUAL RASH, SWELLING OR PAIN  UNUSUAL VAGINAL DISCHARGE OR ITCHING   Items with * indicate a potential emergency and should be followed up as soon as possible or go to the Emergency Department if any problems should occur.  Please show the CHEMOTHERAPY ALERT CARD or IMMUNOTHERAPY ALERT CARD at check-in to the Emergency Department and triage nurse.  Should you have questions after your visit or need to cancel  or reschedule your appointment, please contact [facility] [phone]  and follow the prompts.  Office hours are 8:00 a.m. to 4:30 p.m. Monday - Friday. Please note that voicemails left after 4:00 p.m. may not be returned until the following business day.  We are closed weekends and major holidays. You have access to a nurse at all times for urgent questions. Please call the main number to the clinic [phone] and follow the prompts.  For any non-urgent questions, you may also contact your provider using MyChart. We now offer e-Visits for anyone 84 and older to request care online for  non-urgent symptoms. For details visit [email or web address].   Also download the MyChart app! Go to the app store, search "MyChart", open the app, select [facility], and log in with your MyChart username and password.  Masks are optional in the cancer centers. If you would like for your care team to wear a mask while they are taking care of you, please let them know. You may have one support person who is at least 74 years old accompany you for your appointments.

## 2022-10-03 NOTE — Progress Notes (Signed)
Patient tolerated Procrit 40,000 U injection with no complaints voiced.  Site clean and dry with no bruising or swelling noted.  No complaints of pain.  Discharged with vital signs stable and no signs or symptoms of distress noted.

## 2022-10-07 NOTE — Progress Notes (Signed)
Patient verified taking Magnesium as prescribed.  Has followed up with Dr. Pleas Koch about this and they are managing at this point.

## 2022-10-09 ENCOUNTER — Other Ambulatory Visit: Payer: Self-pay

## 2022-10-09 DIAGNOSIS — D696 Thrombocytopenia, unspecified: Secondary | ICD-10-CM

## 2022-10-09 DIAGNOSIS — D500 Iron deficiency anemia secondary to blood loss (chronic): Secondary | ICD-10-CM

## 2022-10-09 DIAGNOSIS — D649 Anemia, unspecified: Secondary | ICD-10-CM

## 2022-10-09 DIAGNOSIS — E538 Deficiency of other specified B group vitamins: Secondary | ICD-10-CM

## 2022-10-10 ENCOUNTER — Inpatient Hospital Stay: Payer: Medicare Other

## 2022-10-10 VITALS — BP 149/66 | HR 62 | Temp 97.4°F | Resp 18

## 2022-10-10 DIAGNOSIS — I129 Hypertensive chronic kidney disease with stage 1 through stage 4 chronic kidney disease, or unspecified chronic kidney disease: Secondary | ICD-10-CM | POA: Diagnosis not present

## 2022-10-10 DIAGNOSIS — D649 Anemia, unspecified: Secondary | ICD-10-CM

## 2022-10-10 DIAGNOSIS — D500 Iron deficiency anemia secondary to blood loss (chronic): Secondary | ICD-10-CM

## 2022-10-10 DIAGNOSIS — D696 Thrombocytopenia, unspecified: Secondary | ICD-10-CM

## 2022-10-10 DIAGNOSIS — N182 Chronic kidney disease, stage 2 (mild): Secondary | ICD-10-CM | POA: Diagnosis not present

## 2022-10-10 DIAGNOSIS — D509 Iron deficiency anemia, unspecified: Secondary | ICD-10-CM | POA: Diagnosis not present

## 2022-10-10 DIAGNOSIS — D631 Anemia in chronic kidney disease: Secondary | ICD-10-CM | POA: Diagnosis not present

## 2022-10-10 DIAGNOSIS — E538 Deficiency of other specified B group vitamins: Secondary | ICD-10-CM

## 2022-10-10 DIAGNOSIS — N183 Chronic kidney disease, stage 3 unspecified: Secondary | ICD-10-CM

## 2022-10-10 LAB — CBC WITH DIFFERENTIAL/PLATELET
Abs Immature Granulocytes: 0.02 10*3/uL (ref 0.00–0.07)
Basophils Absolute: 0 10*3/uL (ref 0.0–0.1)
Basophils Relative: 1 %
Eosinophils Absolute: 0.1 10*3/uL (ref 0.0–0.5)
Eosinophils Relative: 3 %
HCT: 29.9 % — ABNORMAL LOW (ref 39.0–52.0)
Hemoglobin: 9 g/dL — ABNORMAL LOW (ref 13.0–17.0)
Immature Granulocytes: 0 %
Lymphocytes Relative: 30 %
Lymphs Abs: 1.4 10*3/uL (ref 0.7–4.0)
MCH: 28.2 pg (ref 26.0–34.0)
MCHC: 30.1 g/dL (ref 30.0–36.0)
MCV: 93.7 fL (ref 80.0–100.0)
Monocytes Absolute: 0.6 10*3/uL (ref 0.1–1.0)
Monocytes Relative: 12 %
Neutro Abs: 2.6 10*3/uL (ref 1.7–7.7)
Neutrophils Relative %: 54 %
Platelets: 219 10*3/uL (ref 150–400)
RBC: 3.19 MIL/uL — ABNORMAL LOW (ref 4.22–5.81)
RDW: 22.4 % — ABNORMAL HIGH (ref 11.5–15.5)
WBC: 4.8 10*3/uL (ref 4.0–10.5)
nRBC: 0 % (ref 0.0–0.2)

## 2022-10-10 LAB — IRON AND TIBC
Iron: 28 ug/dL — ABNORMAL LOW (ref 45–182)
Saturation Ratios: 13 % — ABNORMAL LOW (ref 17.9–39.5)
TIBC: 210 ug/dL — ABNORMAL LOW (ref 250–450)
UIBC: 182 ug/dL

## 2022-10-10 LAB — COMPREHENSIVE METABOLIC PANEL
ALT: 8 U/L (ref 0–44)
AST: 10 U/L — ABNORMAL LOW (ref 15–41)
Albumin: 3.4 g/dL — ABNORMAL LOW (ref 3.5–5.0)
Alkaline Phosphatase: 71 U/L (ref 38–126)
Anion gap: 5 (ref 5–15)
BUN: 9 mg/dL (ref 8–23)
CO2: 28 mmol/L (ref 22–32)
Calcium: 8.6 mg/dL — ABNORMAL LOW (ref 8.9–10.3)
Chloride: 107 mmol/L (ref 98–111)
Creatinine, Ser: 1.05 mg/dL (ref 0.61–1.24)
GFR, Estimated: >60 mL/min (ref >60)
Glucose, Bld: 95 mg/dL (ref 70–99)
Potassium: 4.3 mmol/L (ref 3.5–5.1)
Sodium: 140 mmol/L (ref 135–145)
Total Bilirubin: 0.8 mg/dL (ref 0.3–1.2)
Total Protein: 6.6 g/dL (ref 6.5–8.1)

## 2022-10-10 LAB — FERRITIN: Ferritin: 421 ng/mL — ABNORMAL HIGH (ref 24–336)

## 2022-10-10 LAB — VITAMIN B12: Vitamin B-12: 426 pg/mL (ref 180–914)

## 2022-10-10 MED ORDER — EPOETIN ALFA 40000 UNIT/ML IJ SOLN
40000.0000 [IU] | Freq: Once | INTRAMUSCULAR | Status: AC
  Administered 2022-10-10: 40000 [IU] via SUBCUTANEOUS
  Filled 2022-10-10: qty 1

## 2022-10-10 NOTE — Progress Notes (Signed)
Epogen injection given per orders. Patient tolerated it well without problems. Vitals stable and discharged home from clinic ambulatory. Follow up as scheduled.  

## 2022-10-10 NOTE — Patient Instructions (Signed)
[facility]  Discharge Instructions: Thank you for choosing [facility] to provide your oncology and hematology care.  If you have a lab appointment with the [facility], please come in thru the Main Entrance and check in at the main information desk.   We strive to give you quality time with your provider. You may need to reschedule your appointment if you arrive late (15 or more minutes).  Arriving late affects you and other patients whose appointments are after yours.  Also, if you miss three or more appointments without notifying the office, you may be dismissed from the clinic at the provider's discretion.      For prescription refill requests, have your pharmacy contact our office and allow 72 hours for refills to be completed.    Today you received your epogen injection per MD   To help prevent nausea and vomiting after your treatment, we encourage you to take your nausea medication as directed.  BELOW ARE SYMPTOMS THAT SHOULD BE REPORTED IMMEDIATELY: *FEVER GREATER THAN 100.4 F (38 C) OR HIGHER *CHILLS OR SWEATING *NAUSEA AND VOMITING THAT IS NOT CONTROLLED WITH YOUR NAUSEA MEDICATION *UNUSUAL SHORTNESS OF BREATH *UNUSUAL BRUISING OR BLEEDING *URINARY PROBLEMS (pain or burning when urinating, or frequent urination) *BOWEL PROBLEMS (unusual diarrhea, constipation, pain near the anus) TENDERNESS IN MOUTH AND THROAT WITH OR WITHOUT PRESENCE OF ULCERS (sore throat, sores in mouth, or a toothache) UNUSUAL RASH, SWELLING OR PAIN  UNUSUAL VAGINAL DISCHARGE OR ITCHING   Items with * indicate a potential emergency and should be followed up as soon as possible or go to the Emergency Department if any problems should occur.  Please show the CHEMOTHERAPY ALERT CARD or IMMUNOTHERAPY ALERT CARD at check-in to the Emergency Department and triage nurse.  Should you have questions after your visit or need to cancel or reschedule your appointment, please  contact [facility] [phone]  and follow the prompts.  Office hours are 8:00 a.m. to 4:30 p.m. Monday - Friday. Please note that voicemails left after 4:00 p.m. may not be returned until the following business day.  We are closed weekends and major holidays. You have access to a nurse at all times for urgent questions. Please call the main number to the clinic [phone] and follow the prompts.  For any non-urgent questions, you may also contact your provider using MyChart. We now offer e-Visits for anyone 56 and older to request care online for non-urgent symptoms. For details visit [email or web address].   Also download the MyChart app! Go to the app store, search "MyChart", open the app, select [facility], and log in with your MyChart username and password.  Masks are optional in the cancer centers. If you would like for your care team to wear a mask while they are taking care of you, please let them know. You may have one support person who is at least 74 years old accompany you for your appointments.

## 2022-10-14 LAB — METHYLMALONIC ACID, SERUM: Methylmalonic Acid, Quantitative: 250 nmol/L (ref 0–378)

## 2022-10-17 ENCOUNTER — Inpatient Hospital Stay: Payer: Medicare Other

## 2022-10-17 ENCOUNTER — Inpatient Hospital Stay: Payer: Medicare Other | Attending: Hematology | Admitting: Physician Assistant

## 2022-10-20 DIAGNOSIS — E876 Hypokalemia: Secondary | ICD-10-CM | POA: Diagnosis not present

## 2022-10-20 DIAGNOSIS — N183 Chronic kidney disease, stage 3 unspecified: Secondary | ICD-10-CM | POA: Diagnosis not present

## 2022-10-20 DIAGNOSIS — R5383 Other fatigue: Secondary | ICD-10-CM | POA: Diagnosis not present

## 2022-10-20 DIAGNOSIS — D649 Anemia, unspecified: Secondary | ICD-10-CM | POA: Diagnosis not present

## 2022-10-20 DIAGNOSIS — E871 Hypo-osmolality and hyponatremia: Secondary | ICD-10-CM | POA: Diagnosis not present

## 2022-10-21 DIAGNOSIS — E876 Hypokalemia: Secondary | ICD-10-CM | POA: Diagnosis not present

## 2022-10-21 DIAGNOSIS — I426 Alcoholic cardiomyopathy: Secondary | ICD-10-CM | POA: Diagnosis not present

## 2022-10-21 DIAGNOSIS — N183 Chronic kidney disease, stage 3 unspecified: Secondary | ICD-10-CM | POA: Diagnosis not present

## 2022-10-21 DIAGNOSIS — R0789 Other chest pain: Secondary | ICD-10-CM | POA: Diagnosis not present

## 2022-10-21 DIAGNOSIS — I10 Essential (primary) hypertension: Secondary | ICD-10-CM | POA: Diagnosis not present

## 2022-10-21 DIAGNOSIS — D6489 Other specified anemias: Secondary | ICD-10-CM | POA: Diagnosis not present

## 2022-10-21 DIAGNOSIS — Z6834 Body mass index (BMI) 34.0-34.9, adult: Secondary | ICD-10-CM | POA: Diagnosis not present

## 2022-11-17 NOTE — Patient Instructions (Signed)
Roger Hansen  11/17/2022     '@[email or web address]'$ @   Your procedure is scheduled on  11/20/2022.   Report to [facility] at  0600  A.M.   Call this number if you have problems the morning of surgery:  [phone]  If you experience any cold or flu symptoms such as cough, fever, chills, shortness of breath, etc. between now and your scheduled surgery, please notify us at the above number.   Remember:  Follow the diet and prep instructions given to you by the office.     Take these medicines the morning of surgery with A SIP OF WATER          alprazolam, bupropion, donazepril, gabapentin, metoprolol, omeprazole, flomax.     Do not wear jewelry, make-up or nail polish.  Do not wear lotions, powders, or perfumes, or deodorant.  Do not shave 48 hours prior to surgery.  Men may shave face and neck.  Do not bring valuables to the hospital.  [facility] is not responsible for any belongings or valuables.  Contacts, dentures or bridgework may not be worn into surgery.  Leave your suitcase in the car.  After surgery it may be brought to your room.  For patients admitted to the hospital, discharge time will be determined by your treatment team.  Patients discharged the day of surgery will not be allowed to drive home and must have someone with them for 24 hours.    Special instructions:   DO NOT smoke tobacco or vape for 24 hours before your procedure.  Please read over the following fact sheets that you were given. Anesthesia Post-op Instructions and Care and Recovery After Surgery      Upper Endoscopy, Adult, Care After After the procedure, it is common to have a sore throat. It is also common to have: Mild stomach pain or discomfort. Bloating. Nausea. Follow these instructions at home: The instructions below may help you care for yourself at home. Your health care provider may give you more instructions. If you have questions, ask your health care  provider. If you were given a sedative during the procedure, it can affect you for several hours. Do not drive or operate machinery until your health care provider says that it is safe. If you will be going home right after the procedure, plan to have a responsible adult: Take you home from the hospital or clinic. You will not be allowed to drive. Care for you for the time you are told. Follow instructions from your health care provider about what you may eat and drink. Return to your normal activities as told by your health care provider. Ask your health care provider what activities are safe for you. Take over-the-counter and prescription medicines only as told by your health care provider. Contact a health care provider if you: Have a sore throat that lasts longer than one day. Have trouble swallowing. Have a fever. Get help right away if you: Vomit blood or your vomit looks like coffee grounds. Have bloody, black, or tarry stools. Have a very bad sore throat or you cannot swallow. Have difficulty breathing or very bad pain in your chest or abdomen. These symptoms may be an emergency. Get help right away. Call 911. Do not wait to see if the symptoms will go away. Do not drive yourself to the hospital. Summary After the procedure, it is common to have a sore throat, mild stomach discomfort, bloating,  and nausea. If you were given a sedative during the procedure, it can affect you for several hours. Do not drive until your health care provider says that it is safe. Follow instructions from your health care provider about what you may eat and drink. Return to your normal activities as told by your health care provider. This information is not intended to replace advice given to you by your health care provider. Make sure you discuss any questions you have with your health care provider. Document Revised: 03/12/2022 Document Reviewed: 03/12/2022 Elsevier Patient Education  [place]. Esophageal Dilatation Esophageal dilatation, also called esophageal dilation, is a procedure to widen or open a blocked or narrowed part of the esophagus. The esophagus is the part of the body that moves food and liquid from the mouth to the stomach. You may need this procedure if: You have a buildup of scar tissue in your esophagus that makes it difficult, painful, or impossible to swallow. This can be caused by gastroesophageal reflux disease (GERD). You have cancer of the esophagus. There is a problem with how food moves through your esophagus. In some cases, you may need this procedure repeated at a later time to dilate the esophagus gradually. Tell a health care provider about: Any allergies you have. All medicines you are taking, including vitamins, herbs, eye drops, creams, and over-the-counter medicines. Any problems you or family members have had with anesthetic medicines. Any blood disorders you have. Any surgeries you have had. Any medical conditions you have. Any antibiotic medicines you are required to take before dental procedures. Whether you are pregnant or may be pregnant. What are the risks? Generally, this is a safe procedure. However, problems may occur, including: Bleeding due to a tear in the lining of the esophagus. A hole, or perforation, in the esophagus. What happens before the procedure? Ask your health care provider about: Changing or stopping your regular medicines. This is especially important if you are taking diabetes medicines or blood thinners. Taking medicines such as aspirin and ibuprofen. These medicines can thin your blood. Do not take these medicines unless your health care provider tells you to take them. Taking over-the-counter medicines, vitamins, herbs, and supplements. Follow instructions from your health care provider about eating or drinking restrictions. Plan to have a responsible adult take you home from the hospital or clinic. Plan to  have a responsible adult care for you for the time you are told after you leave the hospital or clinic. This is important. What happens during the procedure? You may be given a medicine to help you relax (sedative). A numbing medicine may be sprayed into the back of your throat, or you may gargle the medicine. Your health care provider may perform the dilatation using various surgical instruments, such as: Simple dilators. This instrument is carefully placed in the esophagus to stretch it. Guided wire bougies. This involves using an endoscope to insert a wire into the esophagus. A dilator is passed over this wire to enlarge the esophagus. Then the wire is removed. Balloon dilators. An endoscope with a small balloon is inserted into the esophagus. The balloon is inflated to stretch the esophagus and open it up. The procedure may vary among health care providers and hospitals. What can I expect after the procedure? Your blood pressure, heart rate, breathing rate, and blood oxygen level will be monitored until you leave the hospital or clinic. Your throat may feel slightly sore and numb. This will get better over time. You  will not be allowed to eat or drink until your throat is no longer numb. When you are able to drink, urinate, and sit on the edge of the bed without nausea or dizziness, you may be able to return home. Follow these instructions at home: Take over-the-counter and prescription medicines only as told by your health care provider. If you were given a sedative during the procedure, it can affect you for several hours. Do not drive or operate machinery until your health care provider says that it is safe. Plan to have a responsible adult care for you for the time you are told. This is important. Follow instructions from your health care provider about any eating or drinking restrictions. Do not use any products that contain nicotine or tobacco, such as cigarettes, e-cigarettes, and  chewing tobacco. If you need help quitting, ask your health care provider. Keep all follow-up visits. This is important. Contact a health care provider if: You have a fever. You have pain that is not relieved by medicine. Get help right away if: You have chest pain. You have trouble breathing. You have trouble swallowing. You vomit blood. You have black, tarry, or bloody stools. These symptoms may represent a serious problem that is an emergency. Do not wait to see if the symptoms will go away. Get medical help right away. Call your local emergency services (911 in the [place] Do not drive yourself to the hospital. Summary Esophageal dilatation, also called esophageal dilation, is a procedure to widen or open a blocked or narrowed part of the esophagus. Plan to have a responsible adult take you home from the hospital or clinic. For this procedure, a numbing medicine may be sprayed into the back of your throat, or you may gargle the medicine. Do not drive or operate machinery until your health care provider says that it is safe. This information is not intended to replace advice given to you by your health care provider. Make sure you discuss any questions you have with your health care provider. Document Revised: 04/18/2020 Document Reviewed: 04/18/2020 Elsevier Patient Education  [place]. Colonoscopy, Adult, Care After The following information offers guidance on how to care for yourself after your procedure. Your health care provider may also give you more specific instructions. If you have problems or questions, contact your health care provider. What can I expect after the procedure? After the procedure, it is common to have: A small amount of blood in your stool for 24 hours after the procedure. Some gas. Mild cramping or bloating of your abdomen. Follow these instructions at home: Eating and drinking  Drink enough fluid to keep your urine pale yellow. Follow  instructions from your health care provider about eating or drinking restrictions. Resume your normal diet as told by your health care provider. Avoid heavy or fried foods that are hard to digest. Activity Rest as told by your health care provider. Avoid sitting for a long time without moving. Get up to take short walks every 1-2 hours. This is important to improve blood flow and breathing. Ask for help if you feel weak or unsteady. Return to your normal activities as told by your health care provider. Ask your health care provider what activities are safe for you. Managing cramping and bloating  Try walking around when you have cramps or feel bloated. If directed, apply heat to your abdomen as told by your health care provider. Use the heat source that your health care provider recommends, such as a  moist heat pack or a heating pad. Place a towel between your skin and the heat source. Leave the heat on for 20-30 minutes. Remove the heat if your skin turns bright red. This is especially important if you are unable to feel pain, heat, or cold. You have a greater risk of getting burned. General instructions If you were given a sedative during the procedure, it can affect you for several hours. Do not drive or operate machinery until your health care provider says that it is safe. For the first 24 hours after the procedure: Do not sign important documents. Do not drink alcohol. Do your regular daily activities at a slower pace than normal. Eat soft foods that are easy to digest. Take over-the-counter and prescription medicines only as told by your health care provider. Keep all follow-up visits. This is important. Contact a health care provider if: You have blood in your stool 2-3 days after the procedure. Get help right away if: You have more than a small spotting of blood in your stool. You have large blood clots in your stool. You have swelling of your abdomen. You have nausea or  vomiting. You have a fever. You have increasing pain in your abdomen that is not relieved with medicine. These symptoms may be an emergency. Get help right away. Call 911. Do not wait to see if the symptoms will go away. Do not drive yourself to the hospital. Summary After the procedure, it is common to have a small amount of blood in your stool. You may also have mild cramping and bloating of your abdomen. If you were given a sedative during the procedure, it can affect you for several hours. Do not drive or operate machinery until your health care provider says that it is safe. Get help right away if you have a lot of blood in your stool, nausea or vomiting, a fever, or increased pain in your abdomen. This information is not intended to replace advice given to you by your health care provider. Make sure you discuss any questions you have with your health care provider. Document Revised: 07/24/2021 Document Reviewed: 07/24/2021 Elsevier Patient Education  [place] After The following information offers guidance on how to care for yourself after your procedure. Your health care provider may also give you more specific instructions. If you have problems or questions, contact your health care provider. What can I expect after the procedure? After the procedure, it is common to have: Tiredness. Little or no memory about what happened during or after the procedure. Impaired judgment when it comes to making decisions. Nausea or vomiting. Some trouble with balance. Follow these instructions at home: For the time period you were told by your health care provider:  Rest. Do not participate in activities where you could fall or become injured. Do not drive or use machinery. Do not drink alcohol. Do not take sleeping pills or medicines that cause drowsiness. Do not make important decisions or sign legal documents. Do not take care of children on your  own. Medicines Take over-the-counter and prescription medicines only as told by your health care provider. If you were prescribed antibiotics, take them as told by your health care provider. Do not stop using the antibiotic even if you start to feel better. Eating and drinking Follow instructions from your health care provider about what you may eat and drink. Drink enough fluid to keep your urine pale yellow. If you vomit: Drink clear fluids  slowly and in small amounts as you are able. Clear fluids include water, ice chips, low-calorie sports drinks, and fruit juice that has water added to it (diluted fruit juice). Eat light and bland foods in small amounts as you are able. These foods include bananas, applesauce, rice, lean meats, toast, and crackers. General instructions  Have a responsible adult stay with you for the time you are told. It is important to have someone help care for you until you are awake and alert. If you have sleep apnea, surgery and some medicines can increase your risk for breathing problems. Follow instructions from your health care provider about wearing your sleep device: When you are sleeping. This includes during daytime naps. While taking prescription pain medicines, sleeping medicines, or medicines that make you drowsy. Do not use any products that contain nicotine or tobacco. These products include cigarettes, chewing tobacco, and vaping devices, such as e-cigarettes. If you need help quitting, ask your health care provider. Contact a health care provider if: You feel nauseous or vomit every time you eat or drink. You feel light-headed. You are still sleepy or having trouble with balance after 24 hours. You get a rash. You have a fever. You have redness or swelling around the IV site. Get help right away if: You have trouble breathing. You have new confusion after you get home. These symptoms may be an emergency. Get help right away. Call 911. Do not wait  to see if the symptoms will go away. Do not drive yourself to the hospital. This information is not intended to replace advice given to you by your health care provider. Make sure you discuss any questions you have with your health care provider. Document Revised: 04/28/2022 Document Reviewed: 04/28/2022 Elsevier Patient Education  [place].

## 2022-11-18 ENCOUNTER — Other Ambulatory Visit (INDEPENDENT_AMBULATORY_CARE_PROVIDER_SITE_OTHER): Payer: Self-pay

## 2022-11-18 ENCOUNTER — Encounter (HOSPITAL_COMMUNITY)
Admission: RE | Admit: 2022-11-18 | Discharge: 2022-11-18 | Disposition: A | Discharge status: Home / Self Care | Payer: Medicare Other | Source: Ambulatory Visit | Attending: Gastroenterology | Admitting: Gastroenterology

## 2022-11-18 VITALS — BP 142/69 | HR 63 | Temp 97.8°F | Resp 18 | Ht 65.0 in | Wt 198.6 lb

## 2022-11-18 DIAGNOSIS — D509 Iron deficiency anemia, unspecified: Secondary | ICD-10-CM | POA: Diagnosis not present

## 2022-11-18 DIAGNOSIS — I129 Hypertensive chronic kidney disease with stage 1 through stage 4 chronic kidney disease, or unspecified chronic kidney disease: Secondary | ICD-10-CM | POA: Diagnosis not present

## 2022-11-18 DIAGNOSIS — E875 Hyperkalemia: Secondary | ICD-10-CM

## 2022-11-18 DIAGNOSIS — D649 Anemia, unspecified: Secondary | ICD-10-CM | POA: Diagnosis not present

## 2022-11-18 DIAGNOSIS — N183 Chronic kidney disease, stage 3 unspecified: Secondary | ICD-10-CM

## 2022-11-18 DIAGNOSIS — I10 Essential (primary) hypertension: Secondary | ICD-10-CM

## 2022-11-18 DIAGNOSIS — Z01818 Encounter for other preprocedural examination: Secondary | ICD-10-CM | POA: Insufficient documentation

## 2022-11-18 LAB — COMPREHENSIVE METABOLIC PANEL
ALT: 9 U/L (ref 0–44)
AST: 13 U/L — ABNORMAL LOW (ref 15–41)
Albumin: 4.1 g/dL (ref 3.5–5.0)
Alkaline Phosphatase: 64 U/L (ref 38–126)
Anion gap: 9 (ref 5–15)
BUN: 26 mg/dL — ABNORMAL HIGH (ref 8–23)
CO2: 24 mmol/L (ref 22–32)
Calcium: 9.2 mg/dL (ref 8.9–10.3)
Chloride: 103 mmol/L (ref 98–111)
Creatinine, Ser: 1.3 mg/dL — ABNORMAL HIGH (ref 0.61–1.24)
GFR, Estimated: 58 mL/min — ABNORMAL LOW (ref >60)
Glucose, Bld: 89 mg/dL (ref 70–99)
Potassium: 5.5 mmol/L — ABNORMAL HIGH (ref 3.5–5.1)
Sodium: 136 mmol/L (ref 135–145)
Total Bilirubin: 0.5 mg/dL (ref 0.3–1.2)
Total Protein: 7.7 g/dL (ref 6.5–8.1)

## 2022-11-18 LAB — CBC WITH DIFFERENTIAL/PLATELET
Abs Immature Granulocytes: 0.01 10*3/uL (ref 0.00–0.07)
Basophils Absolute: 0 10*3/uL (ref 0.0–0.1)
Basophils Relative: 1 %
Eosinophils Absolute: 0.3 10*3/uL (ref 0.0–0.5)
Eosinophils Relative: 6 %
HCT: 33.8 % — ABNORMAL LOW (ref 39.0–52.0)
Hemoglobin: 10.3 g/dL — ABNORMAL LOW (ref 13.0–17.0)
Immature Granulocytes: 0 %
Lymphocytes Relative: 34 %
Lymphs Abs: 1.6 10*3/uL (ref 0.7–4.0)
MCH: 28.1 pg (ref 26.0–34.0)
MCHC: 30.5 g/dL (ref 30.0–36.0)
MCV: 92.3 fL (ref 80.0–100.0)
Monocytes Absolute: 0.4 10*3/uL (ref 0.1–1.0)
Monocytes Relative: 9 %
Neutro Abs: 2.3 10*3/uL (ref 1.7–7.7)
Neutrophils Relative %: 50 %
Platelets: 220 10*3/uL (ref 150–400)
RBC: 3.66 MIL/uL — ABNORMAL LOW (ref 4.22–5.81)
RDW: 17 % — ABNORMAL HIGH (ref 11.5–15.5)
WBC: 4.7 10*3/uL (ref 4.0–10.5)
nRBC: 0 % (ref 0.0–0.2)

## 2022-11-19 DIAGNOSIS — E875 Hyperkalemia: Secondary | ICD-10-CM | POA: Diagnosis not present

## 2022-11-20 ENCOUNTER — Ambulatory Visit (HOSPITAL_COMMUNITY): Payer: Medicare Other | Admitting: Anesthesiology

## 2022-11-20 ENCOUNTER — Encounter (INDEPENDENT_AMBULATORY_CARE_PROVIDER_SITE_OTHER): Payer: Self-pay | Admitting: *Deleted

## 2022-11-20 ENCOUNTER — Ambulatory Visit (HOSPITAL_BASED_OUTPATIENT_CLINIC_OR_DEPARTMENT_OTHER): Payer: Medicare Other | Admitting: Anesthesiology

## 2022-11-20 ENCOUNTER — Encounter (HOSPITAL_COMMUNITY)
Admission: RE | Disposition: A | Discharge status: Home / Self Care | Payer: Self-pay | Source: Home / Self Care | Attending: Gastroenterology

## 2022-11-20 ENCOUNTER — Encounter (HOSPITAL_COMMUNITY): Payer: Self-pay | Admitting: Gastroenterology

## 2022-11-20 ENCOUNTER — Ambulatory Visit (HOSPITAL_COMMUNITY)
Admission: RE | Admit: 2022-11-20 | Discharge: 2022-11-20 | Disposition: A | Discharge status: Home / Self Care | Payer: Medicare Other | Attending: Gastroenterology | Admitting: Gastroenterology

## 2022-11-20 DIAGNOSIS — K2980 Duodenitis without bleeding: Secondary | ICD-10-CM | POA: Insufficient documentation

## 2022-11-20 DIAGNOSIS — R194 Change in bowel habit: Secondary | ICD-10-CM

## 2022-11-20 DIAGNOSIS — F1010 Alcohol abuse, uncomplicated: Secondary | ICD-10-CM | POA: Diagnosis not present

## 2022-11-20 DIAGNOSIS — D509 Iron deficiency anemia, unspecified: Secondary | ICD-10-CM | POA: Insufficient documentation

## 2022-11-20 DIAGNOSIS — R1310 Dysphagia, unspecified: Secondary | ICD-10-CM

## 2022-11-20 DIAGNOSIS — D649 Anemia, unspecified: Secondary | ICD-10-CM | POA: Diagnosis not present

## 2022-11-20 DIAGNOSIS — N183 Chronic kidney disease, stage 3 unspecified: Secondary | ICD-10-CM | POA: Diagnosis not present

## 2022-11-20 DIAGNOSIS — K219 Gastro-esophageal reflux disease without esophagitis: Secondary | ICD-10-CM | POA: Diagnosis not present

## 2022-11-20 DIAGNOSIS — Z860100 Personal history of colon polyps, unspecified: Secondary | ICD-10-CM

## 2022-11-20 DIAGNOSIS — D120 Benign neoplasm of cecum: Secondary | ICD-10-CM | POA: Insufficient documentation

## 2022-11-20 DIAGNOSIS — K5730 Diverticulosis of large intestine without perforation or abscess without bleeding: Secondary | ICD-10-CM | POA: Diagnosis not present

## 2022-11-20 DIAGNOSIS — K3189 Other diseases of stomach and duodenum: Secondary | ICD-10-CM

## 2022-11-20 DIAGNOSIS — I129 Hypertensive chronic kidney disease with stage 1 through stage 4 chronic kidney disease, or unspecified chronic kidney disease: Secondary | ICD-10-CM | POA: Insufficient documentation

## 2022-11-20 DIAGNOSIS — D123 Benign neoplasm of transverse colon: Secondary | ICD-10-CM | POA: Insufficient documentation

## 2022-11-20 DIAGNOSIS — I429 Cardiomyopathy, unspecified: Secondary | ICD-10-CM | POA: Diagnosis not present

## 2022-11-20 DIAGNOSIS — R197 Diarrhea, unspecified: Secondary | ICD-10-CM

## 2022-11-20 DIAGNOSIS — K635 Polyp of colon: Secondary | ICD-10-CM

## 2022-11-20 DIAGNOSIS — Z87891 Personal history of nicotine dependence: Secondary | ICD-10-CM | POA: Diagnosis not present

## 2022-11-20 DIAGNOSIS — R110 Nausea: Secondary | ICD-10-CM | POA: Diagnosis not present

## 2022-11-20 DIAGNOSIS — K5520 Angiodysplasia of colon without hemorrhage: Secondary | ICD-10-CM | POA: Diagnosis not present

## 2022-11-20 DIAGNOSIS — D500 Iron deficiency anemia secondary to blood loss (chronic): Secondary | ICD-10-CM

## 2022-11-20 DIAGNOSIS — E7800 Pure hypercholesterolemia, unspecified: Secondary | ICD-10-CM | POA: Insufficient documentation

## 2022-11-20 HISTORY — PX: BIOPSY: SHX5522

## 2022-11-20 HISTORY — PX: POLYPECTOMY: SHX5525

## 2022-11-20 HISTORY — PX: COLONOSCOPY WITH PROPOFOL: SHX5780

## 2022-11-20 HISTORY — PX: ESOPHAGOGASTRODUODENOSCOPY (EGD) WITH PROPOFOL: SHX5813

## 2022-11-20 HISTORY — PX: ESOPHAGEAL DILATION: SHX303

## 2022-11-20 HISTORY — PX: HOT HEMOSTASIS: SHX5433

## 2022-11-20 LAB — BASIC METABOLIC PANEL
BUN: 25 mg/dL (ref 7–25)
CO2: 26 mmol/L (ref 20–32)
Calcium: 9.7 mg/dL (ref 8.6–10.3)
Chloride: 102 mmol/L (ref 98–110)
Creat: 1.26 mg/dL (ref 0.70–1.28)
Glucose, Bld: 94 mg/dL (ref 65–99)
Potassium: 5.1 mmol/L (ref 3.5–5.3)
Sodium: 138 mmol/L (ref 135–146)

## 2022-11-20 LAB — HM COLONOSCOPY

## 2022-11-20 SURGERY — COLONOSCOPY WITH PROPOFOL
Anesthesia: General

## 2022-11-20 MED ORDER — PHENYLEPHRINE 80 MCG/ML (10ML) SYRINGE FOR IV PUSH (FOR BLOOD PRESSURE SUPPORT)
PREFILLED_SYRINGE | INTRAVENOUS | Status: AC
  Filled 2022-11-20: qty 30

## 2022-11-20 MED ORDER — SODIUM CHLORIDE 0.9 % IV SOLN: INTRAVENOUS | Status: DC

## 2022-11-20 MED ORDER — DEXMEDETOMIDINE HCL IN NACL 80 MCG/20ML IV SOLN
INTRAVENOUS | Status: AC
  Filled 2022-11-20: qty 20

## 2022-11-20 MED ORDER — LIDOCAINE HCL (PF) 2 % IJ SOLN
INTRAMUSCULAR | Status: AC
  Filled 2022-11-20: qty 5

## 2022-11-20 MED ORDER — PROPOFOL 500 MG/50ML IV EMUL
INTRAVENOUS | Status: AC
  Filled 2022-11-20: qty 150

## 2022-11-20 MED ORDER — SODIUM CHLORIDE FLUSH 0.9 % IV SOLN
INTRAVENOUS | Status: AC
  Filled 2022-11-20: qty 30

## 2022-11-20 MED ORDER — PHENYLEPHRINE 80 MCG/ML (10ML) SYRINGE FOR IV PUSH (FOR BLOOD PRESSURE SUPPORT)
PREFILLED_SYRINGE | INTRAVENOUS | Status: AC
  Filled 2022-11-20: qty 10

## 2022-11-20 MED ORDER — PROPOFOL 500 MG/50ML IV EMUL
INTRAVENOUS | Status: DC | PRN
  Administered 2022-11-20: 150 ug/kg/min via INTRAVENOUS

## 2022-11-20 MED ORDER — EPHEDRINE 5 MG/ML INJ
INTRAVENOUS | Status: AC
  Filled 2022-11-20: qty 5

## 2022-11-20 MED ORDER — LIDOCAINE HCL (CARDIAC) PF 100 MG/5ML IV SOSY
PREFILLED_SYRINGE | INTRAVENOUS | Status: DC | PRN
  Administered 2022-11-20: 100 mg via INTRAVENOUS

## 2022-11-20 MED ORDER — PHENYLEPHRINE 80 MCG/ML (10ML) SYRINGE FOR IV PUSH (FOR BLOOD PRESSURE SUPPORT)
PREFILLED_SYRINGE | INTRAVENOUS | Status: DC | PRN
  Administered 2022-11-20: 80 ug via INTRAVENOUS
  Administered 2022-11-20: 160 ug via INTRAVENOUS
  Administered 2022-11-20 (×2): 80 ug via INTRAVENOUS
  Administered 2022-11-20 (×2): 160 ug via INTRAVENOUS
  Administered 2022-11-20: 80 ug via INTRAVENOUS

## 2022-11-20 MED ORDER — PROPOFOL 500 MG/50ML IV EMUL
INTRAVENOUS | Status: AC
  Filled 2022-11-20: qty 50

## 2022-11-20 MED ORDER — PROPOFOL 10 MG/ML IV BOLUS
INTRAVENOUS | Status: DC | PRN
  Administered 2022-11-20: 20 mg via INTRAVENOUS
  Administered 2022-11-20: 30 mg via INTRAVENOUS

## 2022-11-20 MED ORDER — DEXMEDETOMIDINE HCL IN NACL 80 MCG/20ML IV SOLN
INTRAVENOUS | Status: DC | PRN
  Administered 2022-11-20 (×2): 4 ug via BUCCAL

## 2022-11-20 MED ORDER — OMEPRAZOLE 40 MG PO CPDR: 40.0000 mg | DELAYED_RELEASE_CAPSULE | Freq: Two times a day (BID) | ORAL | 1 refills | Status: DC

## 2022-11-20 MED ORDER — LACTATED RINGERS IV SOLN: INTRAVENOUS | Status: DC

## 2022-11-20 MED ORDER — IRON 325 (65 FE) MG PO TABS: 1.0000 | ORAL_TABLET | Freq: Two times a day (BID) | ORAL | 3 refills | Status: AC

## 2022-11-20 MED ORDER — GLYCOPYRROLATE 0.2 MG/ML IJ SOLN
INTRAMUSCULAR | Status: DC | PRN
  Administered 2022-11-20: .1 mg via INTRAVENOUS

## 2022-11-20 MED ORDER — GLYCOPYRROLATE PF 0.2 MG/ML IJ SOSY
PREFILLED_SYRINGE | INTRAMUSCULAR | Status: AC
  Filled 2022-11-20: qty 1

## 2022-11-20 MED ORDER — EPHEDRINE SULFATE-NACL 50-0.9 MG/10ML-% IV SOSY
PREFILLED_SYRINGE | INTRAVENOUS | Status: DC | PRN
  Administered 2022-11-20 (×3): 5 mg via INTRAVENOUS

## 2022-11-20 NOTE — Transfer of Care (Signed)
Immediate Anesthesia Transfer of Care Note  Patient: Roger Hansen  Procedure(s) Performed: COLONOSCOPY WITH PROPOFOL RANDOM BIOPSIES ESOPHAGOGASTRODUODENOSCOPY (EGD) WITH PROPOFOL ESOPHAGEAL DILATION POLYPECTOMY HOT HEMOSTASIS (ARGON PLASMA COAGULATION/BICAP)  Patient Location: PACU  Anesthesia Type:General  Level of Consciousness: drowsy and patient cooperative  Airway & Oxygen Therapy: Patient Spontanous Breathing  Post-op Assessment: Report given to RN and Post -op Vital signs reviewed and stable  Post vital signs: Reviewed and stable  Last Vitals:  Vitals Value Taken Time  BP 94/55 11/20/22 0831  Temp 36.7 C 11/20/22 0828  Pulse 74 11/20/22 0828  Resp 13 11/20/22 0828  SpO2 98 % 11/20/22 0828    Last Pain:  Vitals:   11/20/22 0828  TempSrc: Axillary  PainSc:          Complications: No notable events documented.

## 2022-11-20 NOTE — Discharge Instructions (Addendum)
You are being discharged to home.  Resume your previous diet.  We are waiting for your pathology results.  Increase omeprazole 40 mg to twice a day dosing for 2 months, then decrease to once a day Your physician has recommended a repeat colonoscopy for surveillance based on pathology results.  Increase ferrous sulfate to twice a day.

## 2022-11-20 NOTE — Anesthesia Preprocedure Evaluation (Signed)
Anesthesia Evaluation  Patient identified by MRN, date of birth, ID band Patient awake    Reviewed: Allergy & Precautions, H&P , NPO status , Patient's Chart, lab work & pertinent test results, reviewed documented beta blocker date and time   Airway Mallampati: I  TM Distance: >3 FB Neck ROM: Full    Dental  (+) Dental Advisory Given, Edentulous Upper, Missing   Pulmonary former smoker   Pulmonary exam normal breath sounds clear to auscultation       Cardiovascular Exercise Tolerance: Good hypertension, Pt. on medications and Pt. on home beta blockers Normal cardiovascular exam Rhythm:Regular Rate:Normal     Neuro/Psych negative neurological ROS  negative psych ROS   GI/Hepatic Neg liver ROS,GERD  Medicated and Controlled,,  Endo/Other  negative endocrine ROS    Renal/GU Renal InsufficiencyRenal disease  negative genitourinary   Musculoskeletal negative musculoskeletal ROS (+)    Abdominal   Peds negative pediatric ROS (+)  Hematology  (+) Blood dyscrasia, anemia   Anesthesia Other Findings   Reproductive/Obstetrics negative OB ROS                             Anesthesia Physical Anesthesia Plan  ASA: 3  Anesthesia Plan: General   Post-op Pain Management: Minimal or no pain anticipated   Induction: Intravenous  PONV Risk Score and Plan: Propofol infusion  Airway Management Planned: Nasal Cannula and Natural Airway  Additional Equipment:   Intra-op Plan:   Post-operative Plan:   Informed Consent: I have reviewed the patients History and Physical, chart, labs and discussed the procedure including the risks, benefits and alternatives for the proposed anesthesia with the patient or authorized representative who has indicated his/her understanding and acceptance.     Dental advisory given  Plan Discussed with: CRNA and Surgeon  Anesthesia Plan Comments:         Anesthesia Quick Evaluation

## 2022-11-20 NOTE — Anesthesia Postprocedure Evaluation (Signed)
Anesthesia Post Note  Patient: Roger Hansen  Procedure(s) Performed: COLONOSCOPY WITH PROPOFOL RANDOM BIOPSIES ESOPHAGOGASTRODUODENOSCOPY (EGD) WITH PROPOFOL ESOPHAGEAL DILATION POLYPECTOMY HOT HEMOSTASIS (ARGON PLASMA COAGULATION/BICAP)  Patient location during evaluation: Phase II Anesthesia Type: General Level of consciousness: awake and alert and oriented Pain management: pain level controlled Vital Signs Assessment: post-procedure vital signs reviewed and stable Respiratory status: spontaneous breathing, nonlabored ventilation and respiratory function stable Cardiovascular status: blood pressure returned to baseline and stable Postop Assessment: no apparent nausea or vomiting Anesthetic complications: no  No notable events documented.   Last Vitals:  Vitals:   11/20/22 0830 11/20/22 0831  BP: (!) 83/33 (!) 94/55  Pulse:    Resp:    Temp:    SpO2:      Last Pain:  Vitals:   11/20/22 0828  TempSrc: Axillary  PainSc:                  Roger Hansen

## 2022-11-20 NOTE — Op Note (Signed)
[facility] Patient Name: Roger Hansen Procedure Date: 11/20/2022 7:16 AM MRN: [id number] Date of Birth: 1946/10/06 Attending MD: Maylon Peppers , , [id number] CSN: [id number] Age: 76 Admit Type: Outpatient Procedure:                Upper GI endoscopy Indications:              Iron deficiency anemia, Dysphagia Providers:                Maylon Peppers, Janeece Riggers, RN, Everardo Pacific Referring MD:              Medicines:                Monitored Anesthesia Care Complications:            No immediate complications. Estimated Blood Loss:     Estimated blood loss: none. Procedure:                Pre-Anesthesia Assessment:                           - Prior to the procedure, a History and Physical                            was performed, and patient medications, allergies                            and sensitivities were reviewed. The patient's                            tolerance of previous anesthesia was reviewed.                           - The risks and benefits of the procedure and the                            sedation options and risks were discussed with the                            patient. All questions were answered and informed                            consent was obtained.                           - ASA Grade Assessment: III - A patient with severe                            systemic disease.                           After obtaining informed consent, the endoscope was                            passed under direct vision. Throughout the                            procedure, the patient's  blood pressure, pulse, and                            oxygen saturations were monitored continuously. The                            [id number]-[id number] ([id number]) scope was introduced through the                            mouth, and advanced to the second part of duodenum.                            The upper GI endoscopy was accomplished without                             difficulty. The patient tolerated the procedure                            well. Scope In: 7:42:21 AM Scope Out: 7:50:33 AM Total Procedure Duration: 0 hours 8 minutes 12 seconds  Findings:      No endoscopic abnormality was evident in the esophagus to explain the       patient's complaint of dysphagia. It was decided, however, to proceed       with dilation of the entire esophagus. A guidewire was placed and the       scope was withdrawn. Dilation was performed with a Savary dilator with       no resistance at 18 mm. The dilation site was examined and showed no       change.      Patchy mildly erythematous mucosa without bleeding was found in the       gastric antrum. Biopsies were taken with a cold forceps for Helicobacter       pylori testing.      Patchy moderate inflammation characterized by congestion (edema) and       erythema was found in the first portion of the duodenum and in the       second portion of the duodenum. Biopsies were taken with a cold forceps       for histology at the ampulla area given presence of nodularity, although       no presence of adenomatous changes in endoscopy. Impression:               - No endoscopic esophageal abnormality to explain                            patient's dysphagia. Esophagus dilated. Dilated.                           - Erythematous mucosa in the antrum. Biopsied.                           - Duodenitis. Periampullar area biopsied. Moderate Sedation:      Per Anesthesia Care Recommendation:           - Discharge patient to home (ambulatory).                           -  Resume previous diet.                           - Await pathology results.                           - Increase omeprazole 40 mg to twice a day dosing                            for 2 months, then decrease to once a day Procedure Code(s):        --- Professional ---                           [phone], Esophagogastroduodenoscopy, flexible,                             transoral; with insertion of guide wire followed by                            passage of dilator(s) through esophagus over guide                            wire                           43239, 59, Esophagogastroduodenoscopy, flexible,                            transoral; with biopsy, single or multiple Diagnosis Code(s):        --- Professional ---                           R13.10, Dysphagia, unspecified                           K31.89, Other diseases of stomach and duodenum                           K29.80, Duodenitis without bleeding                           D50.9, Iron deficiency anemia, unspecified CPT copyright 2022 [employer]. All rights reserved. The codes documented in this report are preliminary and upon coder review may  be revised to meet current compliance requirements. Maylon Peppers, MD Maylon Peppers,  11/20/2022 8:23:51 AM This report has been signed electronically. Number of Addenda: 0

## 2022-11-20 NOTE — H&P (Signed)
Roger Hansen is an 76 y.o. male.   Chief Complaint: Iron deficiency anemia, constipation, diarrhea, dysphagia HPI: Roger Hansen is a 76 y.o. male with past medical history of alcohol abuse, anemia, cardiomyopathy, CKD stage 3, GERD, colon polyps, High cholesterol, HTN.  Coming for evaluation of  Iron deficiency anemia, constipation, diarrhea, dysphagia.  Patient denies melena or hematochezia, abdominal pain, fever, chills.  States that he used to have diarrhea but now is more constipated.  Takes oral iron once a day.  Past Medical History:  Diagnosis Date   Alcohol abuse 11/24/2016   Anemia 10/11/2013   Cardiomyopathy ([place])    a. EF 50% by echo in 2019 with NST showing no reversible ischemia and thought to be alcohol-induced   Chronic renal disease, stage 3, moderately decreased glomerular filtration rate (GFR) between 30-59 mL/min/1.73 square meter (HCC) 12/02/2016   Colon polyps    GERD (gastroesophageal reflux disease)    High cholesterol    Hypertension    Sinus infection     Past Surgical History:  Procedure Laterality Date   BIOPSY  06/03/2019   Procedure: BIOPSY;  Surgeon: Rogene Houston, MD;  [facility];  Service: Endoscopy;;  esophagus   COLONOSCOPY N/A 10/26/2013   Procedure: COLONOSCOPY;  Surgeon: Rogene Houston, MD;  [facility]: [facility];  Service: Endoscopy;  Laterality: N/A;  325-moved to 1230 Ann to notify pt   Colonoscopy with polypectomy     COLONOSCOPY WITH PROPOFOL N/A 06/03/2019   Procedure: COLONOSCOPY WITH PROPOFOL;  Surgeon: Rogene Houston, MD;  [facility]: [facility];  Service: Endoscopy;  Laterality: N/A;   ESOPHAGOGASTRODUODENOSCOPY (EGD) WITH PROPOFOL N/A 06/03/2019   Procedure: ESOPHAGOGASTRODUODENOSCOPY (EGD) WITH PROPOFOL;  Surgeon: Rogene Houston, MD;  [facility];  Service: Endoscopy;  Laterality: N/A;   POLYPECTOMY  06/03/2019   Procedure: POLYPECTOMY;  Surgeon: Rogene Houston, MD;  [facility]: [facility];   Service: Endoscopy;;  colon    Family History  Problem Relation Age of Onset   Hypertension Mother    Colon cancer Neg Hx    Social History:  reports that he quit smoking about 50 years ago. His smoking use included cigarettes. He has a 10.00 pack-year smoking history. He has been exposed to tobacco smoke. He has never used smokeless tobacco. He reports current alcohol use. He reports that he does not use drugs.  Allergies:  Allergies  Allergen Reactions   Horseradish [Armoracia Rusticana Ext (Horseradish)] Anaphylaxis    Medications Prior to Admission  Medication Sig Dispense Refill   ALPRAZolam (XANAX) 1 MG tablet Take 1 mg by mouth daily.     aspirin 81 MG tablet Take 1 tablet (81 mg total) by mouth daily. 30 tablet    atorvastatin (LIPITOR) 10 MG tablet Take 10 mg by mouth every morning.     buPROPion (WELLBUTRIN XL) 150 MG 24 hr tablet Take 450 mg by mouth every morning.     cholecalciferol (VITAMIN D) 1000 UNITS tablet Take 1,000 Units by mouth 2 (two) times daily.     docusate sodium (STOOL SOFTENER) 100 MG capsule Take 100 mg by mouth every morning.     donepezil (ARICEPT) 10 MG tablet Take 10 mg by mouth daily with breakfast.     Ferrous Sulfate (IRON) 325 (65 Fe) MG TABS TAKE ONE TABLET BY MOUTH DAILY WITH BREAKFAST. (Patient taking differently: Take 325 mg by mouth daily.) 30 tablet 3   folic acid (FOLVITE) 1 MG tablet  Take 1 mg by mouth daily.     furosemide (LASIX) 20 MG tablet Take 20 mg by mouth daily as needed for fluid or edema.     gabapentin (NEURONTIN) 300 MG capsule Take 300 mg by mouth 3 (three) times daily.     GNP VITAMIN B-1 100 MG tablet Take 100 mg by mouth daily. Thiamine     losartan (COZAAR) 50 MG tablet Take 50 mg by mouth daily.     magnesium oxide (MAG-OX) 400 MG tablet Take 400 mg by mouth 2 (two) times daily.     metoprolol succinate (TOPROL XL) 25 MG 24 hr tablet Take 1 tablet (25 mg total) by mouth daily. 90 tablet 3   nitroGLYCERIN (NITROSTAT)  0.4 MG SL tablet Place 1 tablet (0.4 mg total) under the tongue every 5 (five) minutes as needed for chest pain. 25 tablet 3   omeprazole (PRILOSEC) 40 MG capsule Take 1 capsule (40 mg total) by mouth daily. (Patient taking differently: Take 40 mg by mouth every morning.) 30 capsule 1   polyethylene glycol-electrolytes (TRILYTE) 420 g solution Take 4,000 mLs by mouth as directed. 4000 mL 0   potassium chloride SA (K-DUR) 20 MEQ tablet Take 20 mEq by mouth daily.      QUEtiapine (SEROQUEL) 200 MG tablet Take 200 mg by mouth at bedtime.     tamsulosin (FLOMAX) 0.4 MG CAPS capsule Take 0.4 mg by mouth daily as needed (Urine problems).     traZODone (DESYREL) 100 MG tablet Take 200 mg by mouth at bedtime.     sodium polystyrene (KAYEXALATE) 15 GM/60ML suspension Take 60 mLs (15 g total) by mouth as directed. Take 15 g (60 mL) at noon today.  Take 15 g (60 mL) at 4 PM today.  We will call you tomorrow after labs to let you know if you need to take any additional doses. 240 mL 0    Results for orders placed or performed during the hospital encounter of 11/18/22 (from the past 48 hour(s))  CBC with Differential/Platelet     Status: Abnormal   Collection Time: 11/18/22 10:27 AM  Result Value Ref Range   WBC 4.7 4.0 - 10.5 K/uL   RBC 3.66 (L) 4.22 - 5.81 MIL/uL   Hemoglobin 10.3 (L) 13.0 - 17.0 g/dL   HCT 33.8 (L) 39.0 - 52.0 %   MCV 92.3 80.0 - 100.0 fL   MCH 28.1 26.0 - 34.0 pg   MCHC 30.5 30.0 - 36.0 g/dL   RDW 17.0 (H) 11.5 - 15.5 %   Platelets 220 150 - 400 K/uL   nRBC 0.0 0.0 - 0.2 %   Neutrophils Relative % 50 %   Neutro Abs 2.3 1.7 - 7.7 K/uL   Lymphocytes Relative 34 %   Lymphs Abs 1.6 0.7 - 4.0 K/uL   Monocytes Relative 9 %   Monocytes Absolute 0.4 0.1 - 1.0 K/uL   Eosinophils Relative 6 %   Eosinophils Absolute 0.3 0.0 - 0.5 K/uL   Basophils Relative 1 %   Basophils Absolute 0.0 0.0 - 0.1 K/uL   Immature Granulocytes 0 %   Abs Immature Granulocytes 0.01 0.00 - 0.07 K/uL     Comment: Performed at [facility], [address] [place],  [address]  Comprehensive metabolic panel     Status: Abnormal   Collection Time: 11/18/22 10:27 AM  Result Value Ref Range   Sodium 136 135 - 145 mmol/L   Potassium 5.5 (H) 3.5 - 5.1 mmol/L  Chloride 103 98 - 111 mmol/L   CO2 24 22 - 32 mmol/L   Glucose, Bld 89 70 - 99 mg/dL    Comment: Glucose reference range applies only to samples taken after fasting for at least 8 hours.   BUN 26 (H) 8 - 23 mg/dL   Creatinine, Ser 1.30 (H) 0.61 - 1.24 mg/dL   Calcium 9.2 8.9 - 10.3 mg/dL   Total Protein 7.7 6.5 - 8.1 g/dL   Albumin 4.1 3.5 - 5.0 g/dL   AST 13 (L) 15 - 41 U/L   ALT 9 0 - 44 U/L   Alkaline Phosphatase 64 38 - 126 U/L   Total Bilirubin 0.5 0.3 - 1.2 mg/dL   GFR, Estimated 58 (L) >60 mL/min    Comment: (NOTE) Calculated using the CKD-EPI Creatinine Equation (2021)    Anion gap 9 5 - 15    Comment: Performed at [facility], [address], [place], [place] [address]   No results found.  Review of Systems  Constitutional: Negative.   HENT:  Positive for trouble swallowing.   Eyes: Negative.   Respiratory: Negative.    Cardiovascular: Negative.   Gastrointestinal:  Positive for constipation and diarrhea.  Endocrine: Negative.   Genitourinary: Negative.   Musculoskeletal: Negative.   Skin: Negative.   Allergic/Immunologic: Negative.   Neurological: Negative.   Hematological: Negative.   Psychiatric/Behavioral: Negative.      Blood pressure 133/81, pulse 68, temperature 97.9 F (36.6 C), temperature source Oral, resp. rate 12, height '5\' 4"'$  (1.626 m), weight 90.1 kg, SpO2 100 %. Physical Exam  GENERAL: The patient is AO x3, in no acute distress. HEENT: Head is normocephalic and atraumatic. EOMI are intact. Mouth is well hydrated and without lesions. NECK: Supple. No masses LUNGS: Clear to auscultation. No presence of rhonchi/wheezing/rales. Adequate chest expansion HEART: RRR, normal s1 and  s2. ABDOMEN: Soft, nontender, no guarding, no peritoneal signs, and nondistended. BS +. No masses. EXTREMITIES: Without any cyanosis, clubbing, rash, lesions or edema. NEUROLOGIC: AOx3, no focal motor deficit. SKIN: no jaundice, no rashes  Assessment/Plan Roger Hansen is a 76 y.o. male with past medical history of alcohol abuse, anemia, cardiomyopathy, CKD stage 3, GERD, colon polyps, High cholesterol, HTN.  Coming for evaluation of  Iron deficiency anemia, constipation, diarrhea, dysphagia.  Will proceed with EGD and colonoscopy.  Harvel Quale, MD 11/20/2022, 7:34 AM

## 2022-11-20 NOTE — Op Note (Signed)
[facility] Patient Name: Roger Hansen Procedure Date: 11/20/2022 7:17 AM MRN: [id number] Date of Birth: [birth year] Attending MD: Maylon Peppers , , [id number] CSN: [id number] Age: 76 Admit Type: Outpatient Procedure:                Colonoscopy Indications:              Iron deficiency anemia, Incidental change in bowel                            habits noted Providers:                Maylon Peppers, Janeece Riggers, RN, Everardo Pacific Referring MD:              Medicines:                Monitored Anesthesia Care Complications:            No immediate complications. Estimated Blood Loss:     Estimated blood loss: none. Procedure:                Pre-Anesthesia Assessment:                           - Prior to the procedure, a History and Physical                            was performed, and patient medications, allergies                            and sensitivities were reviewed. The patient's                            tolerance of previous anesthesia was reviewed.                           - The risks and benefits of the procedure and the                            sedation options and risks were discussed with the                            patient. All questions were answered and informed                            consent was obtained.                           - ASA Grade Assessment: III - A patient with severe                            systemic disease.                           After obtaining informed consent, the colonoscope                            was passed under direct vision. Throughout the  procedure, the patient's blood pressure, pulse, and                            oxygen saturations were monitored continuously. The                            [id number]-HQ190L ([id number]) scope was introduced through                            the anus and advanced to the the cecum, identified                            by appendiceal orifice and  ileocecal valve. The                            colonoscopy was performed without difficulty. The                            patient tolerated the procedure well. The quality                            of the bowel preparation was excellent. Scope In: 7:58:53 AM Scope Out: 8:21:34 AM Scope Withdrawal Time: 0 hours 20 minutes 4 seconds  Total Procedure Duration: 0 hours 22 minutes 41 seconds  Findings:      The perianal and digital rectal examinations were normal.      A single small angiodysplastic lesion without bleeding was found in the       ascending colon. Coagulation for bleeding prevention using argon plasma       at 0.3 liters/minute and 20 watts was successful.      Three sessile polyps were found in the transverse colon and cecum. The       polyps were 2 to 5 mm in size. These polyps were removed with a cold       snare. Resection and retrieval were complete.      A 2 mm polyp was found in the transverse colon. The polyp was sessile.       The polyp was removed with a cold biopsy forceps. Resection and       retrieval were complete.      A few small-mouthed diverticula were found in the sigmoid colon.       Biopsies for histology were taken from normal colon with a cold forceps       from the right colon and left colon for evaluation of microscopic       colitis.      The retroflexed view of the distal rectum and anal verge was normal and       showed no anal or rectal abnormalities. Impression:               - A single non-bleeding colonic angiodysplastic                            lesion. Treated with argon plasma coagulation (APC).                           - Three 2 to 5 mm  polyps in the transverse colon                            and in the cecum, removed with a cold snare.                            Resected and retrieved.                           - One 2 mm polyp in the transverse colon, removed                            with a cold biopsy forceps. Resected and  retrieved.                           - Diverticulosis in the sigmoid colon. Normal colon                            biopsied.                           - The distal rectum and anal verge are normal on                            retroflexion view. Moderate Sedation:      Per Anesthesia Care Recommendation:           - Discharge patient to home (ambulatory).                           - Resume previous diet.                           - Await pathology results.                           - Repeat colonoscopy for surveillance based on                            pathology results.                           - Increase ferrous sulfate to twice a day. Procedure Code(s):        --- Professional ---                           [id number], 59, Colonoscopy, flexible; with control of                            bleeding, any method                           [id number], Colonoscopy, flexible; with removal of                            tumor(s), polyp(s), or other lesion(s) by snare  technique                           [id number], 59, Colonoscopy, flexible; with biopsy,                            single or multiple Diagnosis Code(s):        --- Professional ---                           K55.20, Angiodysplasia of colon without hemorrhage                           D12.3, Benign neoplasm of transverse colon (hepatic                            flexure or splenic flexure)                           D12.0, Benign neoplasm of cecum                           D50.9, Iron deficiency anemia, unspecified                           K57.30, Diverticulosis of large intestine without                            perforation or abscess without bleeding CPT copyright 2022 American [employer]. All rights reserved. The codes documented in this report are preliminary and upon coder review may  be revised to meet current compliance requirements. Maylon Peppers, MD Maylon Peppers,  11/20/2022 8:31:11  AM This report has been signed electronically. Number of Addenda: 0

## 2022-11-21 LAB — SURGICAL PATHOLOGY

## 2022-11-25 ENCOUNTER — Inpatient Hospital Stay: Payer: Medicare Other

## 2022-11-25 ENCOUNTER — Inpatient Hospital Stay: Payer: Medicare Other | Attending: Hematology | Admitting: Physician Assistant

## 2022-11-25 DIAGNOSIS — N1831 Chronic kidney disease, stage 3a: Secondary | ICD-10-CM | POA: Insufficient documentation

## 2022-11-25 DIAGNOSIS — D631 Anemia in chronic kidney disease: Secondary | ICD-10-CM | POA: Insufficient documentation

## 2022-11-25 DIAGNOSIS — E538 Deficiency of other specified B group vitamins: Secondary | ICD-10-CM | POA: Insufficient documentation

## 2022-11-25 DIAGNOSIS — I129 Hypertensive chronic kidney disease with stage 1 through stage 4 chronic kidney disease, or unspecified chronic kidney disease: Secondary | ICD-10-CM | POA: Insufficient documentation

## 2022-11-26 ENCOUNTER — Encounter (HOSPITAL_COMMUNITY): Payer: Self-pay | Admitting: Gastroenterology

## 2022-11-28 NOTE — Progress Notes (Unsigned)
[place], [place] [address]   CLINIC:  Medical Oncology/Hematology  PCP:  Curlene Labrum, MD [place] [address] [phone]   REASON FOR VISIT:  Follow-up for anemia of CKD and iron deficiency   PRIOR THERAPY: None   CURRENT THERAPY: Retacrit injections weekly; intermittent parenteral iron therapy; oral iron supplement  INTERVAL HISTORY:  Mr. Pentecost 76 y.o. male returns for routine follow-up of his anemia of CKD and functional iron deficiency.  He was last seen by Tarri Abernethy PA-C on 06/26/2022.  At that time, he was recommended to receive weekly Retacrit and monthly vitamin B12 injections and to follow-up for an office visit in 3 months.  He was briefly lost to follow-up in the interim, and has not received vitamin B12 injection since 09/26/2022.  Last Retacrit injection was 10/10/2022.  At today's visit, he reports feeling fair. *** *** He had EGD and colonoscopy by Dr. Jenetta Downer on 11/20/2022, which showed erythematous gastric mucosa as well as duodenitis on EGD; colonoscopy revealed a single nonbleeding angiodysplastic lesion treated with APC as well as polyps x 4 and diverticulosis; pathology was benign.  *** Before being lost to follow-up, he was tolerating his Retacrit injections fairly well without any worsening blood pressure or symptoms of DVT/PE.  *** ***He reports dark stools from iron pills, but denies any frank hematochezia, melena, epistaxis, or hematemesis.  *** *** His energy has been fair at about ***%. *** He continues to have lightheadedness as well as occasional headaches, chronic restless legs, and chronic ice pica. *** He does have intermittent chest pain and palpitations associated with anxiety and PTSD, but this is improved after being started on blood pressure medication by cardiologist. *** He has occasional shortness of breath on exertion, which he reports is "normal for him." *** Overall, he has  not noticed any worsening in his symptoms or overall health condition.  He has ***% energy and ***% appetite. He endorses that he is maintaining a stable weight.   REVIEW OF SYSTEMS: *** Review of Systems  Constitutional:  Negative for appetite change, chills, diaphoresis, fatigue, fever and unexpected weight change.  HENT:   Negative for lump/mass and nosebleeds.   Eyes:  Negative for eye problems.  Respiratory:  Positive for shortness of breath (with exertion, mild). Negative for cough and hemoptysis.   Cardiovascular:  Positive for chest pain (none today). Negative for leg swelling and palpitations.  Gastrointestinal:  Positive for abdominal pain and diarrhea. Negative for blood in stool, constipation, nausea and vomiting.  Genitourinary:  Negative for hematuria.   Musculoskeletal:  Positive for arthralgias and back pain.  Skin: Negative.   Neurological:  Positive for dizziness, headaches, light-headedness and numbness.  Hematological:  Does not bruise/bleed easily.  Psychiatric/Behavioral:  Positive for depression and sleep disturbance. The patient is nervous/anxious.       PAST MEDICAL/SURGICAL HISTORY:  Past Medical History:  Diagnosis Date   Alcohol abuse 11/24/2016   Anemia 10/11/2013   Cardiomyopathy ([place])    a. EF 50% by echo in 2019 with NST showing no reversible ischemia and thought to be alcohol-induced   Chronic renal disease, stage 3, moderately decreased glomerular filtration rate (GFR) between 30-59 mL/min/1.73 square meter (HCC) 12/02/2016   Colon polyps    GERD (gastroesophageal reflux disease)    High cholesterol    Hypertension    Sinus infection    Past Surgical History:  Procedure Laterality Date   BIOPSY  06/03/2019  Procedure: BIOPSY;  Surgeon: Rogene Houston, MD;  [facility];  Service: Endoscopy;;  esophagus   BIOPSY N/A 11/20/2022   Procedure: RANDOM BIOPSIES;  Surgeon: Harvel Quale, MD;  [facility];  Service:  Gastroenterology;  Laterality: N/A;   COLONOSCOPY N/A 10/26/2013   Procedure: COLONOSCOPY;  Surgeon: Rogene Houston, MD;  [facility];  Service: Endoscopy;  Laterality: N/A;  325-moved to 1230 Ann to notify pt   Colonoscopy with polypectomy     COLONOSCOPY WITH PROPOFOL N/A 06/03/2019   Procedure: COLONOSCOPY WITH PROPOFOL;  Surgeon: Rogene Houston, MD;  [facility];  Service: Endoscopy;  Laterality: N/A;   COLONOSCOPY WITH PROPOFOL N/A 11/20/2022   Procedure: COLONOSCOPY WITH PROPOFOL;  Surgeon: Harvel Quale, MD;  [facility];  Service: Gastroenterology;  Laterality: N/A;  730 ASA 3   ESOPHAGEAL DILATION N/A 11/20/2022   Procedure: ESOPHAGEAL DILATION;  Surgeon: Harvel Quale, MD;  [facility];  Service: Gastroenterology;  Laterality: N/A;   ESOPHAGOGASTRODUODENOSCOPY (EGD) WITH PROPOFOL N/A 06/03/2019   Procedure: ESOPHAGOGASTRODUODENOSCOPY (EGD) WITH PROPOFOL;  Surgeon: Rogene Houston, MD;  [facility];  Service: Endoscopy;  Laterality: N/A;   ESOPHAGOGASTRODUODENOSCOPY (EGD) WITH PROPOFOL N/A 11/20/2022   Procedure: ESOPHAGOGASTRODUODENOSCOPY (EGD) WITH PROPOFOL;  Surgeon: Harvel Quale, MD;  [facility];  Service: Gastroenterology;  Laterality: N/A;   HOT HEMOSTASIS  11/20/2022   Procedure: HOT HEMOSTASIS (ARGON PLASMA COAGULATION/BICAP);  Surgeon: Montez Morita, Quillian Quince, MD;  [facility];  Service: Gastroenterology;;   POLYPECTOMY  06/03/2019   Procedure: POLYPECTOMY;  Surgeon: Rogene Houston, MD;  [facility];  Service: Endoscopy;;  colon   POLYPECTOMY  11/20/2022   Procedure: POLYPECTOMY;  Surgeon: Harvel Quale, MD;  [facility];  Service: Gastroenterology;;     SOCIAL HISTORY:  Social History   Socioeconomic History   Marital status: Married    Spouse name: Not on file   Number of children: Not on file   Years of  education: Not on file   Highest education level: Not on file  Occupational History   Not on file  Tobacco Use   Smoking status: Former    Packs/day: 0.50    Years: 20.00    Total pack years: 10.00    Types: Cigarettes    Quit date: 12/27/1971    Years since quitting: 50.9    Passive exposure: Past   Smokeless tobacco: Never  Vaping Use   Vaping Use: Never used  Substance and Sexual Activity   Alcohol use: Yes    Comment: 1 pint of etoh a day ( not recently)   Drug use: No   Sexual activity: Not on file  Other Topics Concern   Not on file  Social History Narrative   Not on file   Social Determinants of Health   Financial Resource Strain: Not on file  Food Insecurity: No Food Insecurity (11/19/2020)   Hunger Vital Sign    Worried About Running Out of Food in the Last Year: Never true    Ran Out of Food in the Last Year: Never true  Transportation Needs: No Transportation Needs (11/19/2020)   PRAPARE - Hydrologist (Medical): No    Lack of Transportation (Non-Medical): No  Physical Activity: Inactive (11/19/2020)   Exercise Vital Sign    Days of Exercise per Week: 0 days    Minutes  of Exercise per Session: 0 min  Stress: No Stress Concern Present (11/19/2020)   [place]    Feeling of Stress : Not at all  Social Connections: Not on file  Intimate Partner Violence: Not At Risk (11/19/2020)   Humiliation, Afraid, Rape, and Kick questionnaire    Fear of Current or Ex-Partner: No    Emotionally Abused: No    Physically Abused: No    Sexually Abused: No    FAMILY HISTORY:  Family History  Problem Relation Age of Onset   Hypertension Mother    Colon cancer Neg Hx     CURRENT MEDICATIONS:  Outpatient Encounter Medications as of 12/01/2022  Medication Sig   ALPRAZolam (XANAX) 1 MG tablet Take 1 mg by mouth daily.   aspirin 81 MG tablet Take 1 tablet (81 mg total) by mouth  daily.   atorvastatin (LIPITOR) 10 MG tablet Take 10 mg by mouth every morning.   buPROPion (WELLBUTRIN XL) 150 MG 24 hr tablet Take 450 mg by mouth every morning.   cholecalciferol (VITAMIN D) 1000 UNITS tablet Take 1,000 Units by mouth 2 (two) times daily.   docusate sodium (STOOL SOFTENER) 100 MG capsule Take 100 mg by mouth every morning.   donepezil (ARICEPT) 10 MG tablet Take 10 mg by mouth daily with breakfast.   Ferrous Sulfate (IRON) 325 (65 Fe) MG TABS Take 1 tablet (325 mg total) by mouth 2 (two) times daily.   folic acid (FOLVITE) 1 MG tablet Take 1 mg by mouth daily.   furosemide (LASIX) 20 MG tablet Take 20 mg by mouth daily as needed for fluid or edema.   gabapentin (NEURONTIN) 300 MG capsule Take 300 mg by mouth 3 (three) times daily.   GNP VITAMIN B-1 100 MG tablet Take 100 mg by mouth daily. Thiamine   losartan (COZAAR) 50 MG tablet Take 50 mg by mouth daily.   magnesium oxide (MAG-OX) 400 MG tablet Take 400 mg by mouth 2 (two) times daily.   metoprolol succinate (TOPROL XL) 25 MG 24 hr tablet Take 1 tablet (25 mg total) by mouth daily.   nitroGLYCERIN (NITROSTAT) 0.4 MG SL tablet Place 1 tablet (0.4 mg total) under the tongue every 5 (five) minutes as needed for chest pain.   omeprazole (PRILOSEC) 40 MG capsule Take 1 capsule (40 mg total) by mouth 2 (two) times daily.   potassium chloride SA (K-DUR) 20 MEQ tablet Take 20 mEq by mouth daily.    QUEtiapine (SEROQUEL) 200 MG tablet Take 200 mg by mouth at bedtime.   sodium polystyrene (KAYEXALATE) 15 GM/60ML suspension Take 60 mLs (15 g total) by mouth as directed. Take 15 g (60 mL) at noon today.  Take 15 g (60 mL) at 4 PM today.  We will call you tomorrow after labs to let you know if you need to take any additional doses.   tamsulosin (FLOMAX) 0.4 MG CAPS capsule Take 0.4 mg by mouth daily as needed (Urine problems).   traZODone (DESYREL) 100 MG tablet Take 200 mg by mouth at bedtime.   No facility-administered encounter  medications on file as of 12/01/2022.    ALLERGIES:  Allergies  Allergen Reactions   Horseradish [Armoracia Rusticana Ext (Horseradish)] Anaphylaxis     PHYSICAL EXAM:  ECOG PERFORMANCE STATUS: {CHL ONC ECOG MV:[id number]} *** There were no vitals filed for this visit. There were no vitals filed for this visit. Physical Exam Constitutional:      Appearance:  Normal appearance. He is obese.  HENT:     Head: Normocephalic and atraumatic.     Mouth/Throat:     Mouth: Mucous membranes are moist.  Eyes:     Extraocular Movements: Extraocular movements intact.     Pupils: Pupils are equal, round, and reactive to light.  Cardiovascular:     Rate and Rhythm: Normal rate and regular rhythm.     Pulses: Normal pulses.     Heart sounds: Normal heart sounds.  Pulmonary:     Effort: Pulmonary effort is normal.     Breath sounds: Normal breath sounds.  Abdominal:     General: Bowel sounds are normal.     Palpations: Abdomen is soft.     Tenderness: There is no abdominal tenderness.  Musculoskeletal:        General: No swelling.     Right lower leg: No edema.     Left lower leg: No edema.  Lymphadenopathy:     Cervical: No cervical adenopathy.  Skin:    General: Skin is warm and dry.  Neurological:     General: No focal deficit present.     Mental Status: He is alert and oriented to person, place, and time.  Psychiatric:        Mood and Affect: Mood normal.        Behavior: Behavior normal.      LABORATORY DATA:  I have reviewed the labs as listed.  CBC    Component Value Date/Time   WBC 4.7 11/18/2022 1027   RBC 3.66 (L) 11/18/2022 1027   HGB 10.3 (L) 11/18/2022 1027   HCT 33.8 (L) 11/18/2022 1027   HCT 27.9 (L) 08/13/2016 0944   PLT 220 11/18/2022 1027   MCV 92.3 11/18/2022 1027   MCH 28.1 11/18/2022 1027   MCHC 30.5 11/18/2022 1027   RDW 17.0 (H) 11/18/2022 1027   LYMPHSABS 1.6 11/18/2022 1027   MONOABS 0.4 11/18/2022 1027   EOSABS 0.3 11/18/2022 1027    BASOSABS 0.0 11/18/2022 1027      Latest Ref Rng & Units 11/19/2022    8:40 AM 11/18/2022   10:27 AM 10/10/2022    9:58 AM  CMP  Glucose 65 - 99 mg/dL 94  89  95   BUN 7 - 25 mg/dL _0 Creatinine 0.70 - 1.28 mg/dL 1.26  1.30  1.05   Sodium 135 - 146 mmol/L 138  136  140   Potassium 3.5 - 5.3 mmol/L 5.1  5.5  4.3   Chloride 98 - 110 mmol/L 102  103  107   CO2 20 - 32 mmol/L _1 Calcium 8.6 - 10.3 mg/dL 9.7  9.2  8.6   Total Protein 6.5 - 8.1 g/dL  7.7  6.6   Total Bilirubin 0.3 - 1.2 mg/dL  0.5  0.8   Alkaline Phos 38 - 126 U/L  64  71   AST 15 - 41 U/L  13  10   ALT 0 - 44 U/L  9  8     DIAGNOSTIC IMAGING:  I have independently reviewed the relevant imaging and discussed with the patient.  ASSESSMENT & PLAN:  Xin Klawitter PA-C on 06/26/2022.  At that time, he was recommended to receive weekly Retacrit and monthly vitamin B12 injections and to follow-up for an office visit in 3 months.  He was briefly lost to follow-up in the interim, and has not received vitamin B12 injection  since 09/26/2022.  Last Retacrit injection was 10/10/2022. 1.   Normocytic anemia - His anemia dates back to at least 2014 with a negative peripheral work-up, negative bone marrow aspiration and biopsy and cytogenetics on 10/16/2016 (obtained in the setting of heavy alcohol use). The bone marrow showed slightly hypercellular marrow with trilineage hematopoiesis. Chromosome analysis was normal. - MGUS/myeloma panel (09/20/2021) was negative.  Hemolysis work-up was negative. - He has been receiving intermittent Epogen and parenteral iron therapy since June 2020. - He is a Jehovah witness and does not receive blood products. - EGD (11/20/2022): Erythematous gastric mucosa, duodenitis - Colonoscopy (11/20/2022): Single nonbleeding angiodysplastic lesion treated with APC, polyps x 4 (pathology benign), diverticulosis - He was tolerating Retacrit 40,000 units weekly, lost to follow-up after 10/10/2022  - He  continues to take iron tablets 325 mg once daily.   *** - He has required intermittent IV iron - He reports dark bowel movements, possibly from iron pill.  He denies any bright red blood per rectum, hematemesis, or epistaxis.     *** - Most recent nutritional panel (10/10/2022): Ferritin 421, iron saturation 13%.  B12 426, MMA 250. - Additional labs (11/18/2022): Hgb 10.3/MCV 92.3, CMP with creatinine 1.30/GFR 58 - Labs today (12/01/2022): *** - DIFFERENTIAL DIAGNOSIS: Anemia related to mild CKD, functional iron deficiency, and anemia of chronic disease.   Occult GI blood loss possible, although he does have intermittent drops in his Hgb and presence of nonbleeding AVM in colon on most recent colonoscopy but without associated drops in iron Patient reports that he has been anemic ever since [place] and had significant agent orange exposure, which has been associated with aplastic anemia and other conditions. Would also consider possible early MDS (bone marrow biopsy in 2017 showed subtle dyspoietic changes that were nondiagnostic and nonspecific at that time) Unlikely that history of heavy alcohol use (from 2007-2017) would still be causing myelosuppression, since he has stopped drinking for several years. -- If he continues to have worsening anemia, repeat bone marrow biopsy would be reasonable.  Patient is hesitant to undergo repeat bone marrow biopsy and would like to avoid this for as long as possible. - PLAN: *** Retacrit 40,000 weekly - if Hgb stable, can go back to every other week???  *** - No current indication for IV iron    *** - Repeat labs (CBC, CMP, iron/TIBC, ferritin) and RTC in 3 months  ***   2.  CKD stage II with intermittent AKI - Previous labs show creatinine up to 2.26 - Most recent creatinine 1.30/GFR 58 (11/18/2022)  - PLAN: Continue follow-up with PCP for chronic kidney disease, may need referral to nephrology in the future, at PCP discretion.  Avoid NSAIDs and maintain  adequate hydration.   3. Thrombocytopenia, mild - RESOLVED   - Mild thrombocytopenia noted on recent CBC (09/20/2021) with platelets 129 - Has resolved, with most recent CBC (12/01/2022) showing platelets ***   4.  B12 deficiency - Labs from 10/07/2021 showed normal B12 433, but elevated methylmalonic acid at 470, signifying mild B12 deficiency and despite patient taking taking B12 daily 1,000 mcg - Patient was started on monthly B12 injections in November 2022 - B12 normal at 426 with normal MMA as of 10/10/2022 - PLAN: Continue monthly B12 injections.  We will recheck B12 and methylmalonic acid in 6 months (June 2024) ***   PLAN SUMMARY: >> *** >> *** >> ***   All questions were answered. The patient knows to call the clinic with  any problems, questions or concerns.  Medical decision making: ***  Time spent on visit: I spent *** minutes counseling the patient face to face. The total time spent in the appointment was *** minutes and more than 50% was on counseling.   Harriett Rush, PA-C  ***

## 2022-12-01 ENCOUNTER — Other Ambulatory Visit: Payer: Medicare Other

## 2022-12-01 ENCOUNTER — Inpatient Hospital Stay (HOSPITAL_BASED_OUTPATIENT_CLINIC_OR_DEPARTMENT_OTHER): Payer: Medicare Other | Admitting: Physician Assistant

## 2022-12-01 ENCOUNTER — Inpatient Hospital Stay: Payer: Medicare Other

## 2022-12-01 ENCOUNTER — Other Ambulatory Visit: Payer: Self-pay

## 2022-12-01 ENCOUNTER — Ambulatory Visit: Payer: Medicare Other

## 2022-12-01 ENCOUNTER — Encounter: Payer: Self-pay | Admitting: Physician Assistant

## 2022-12-01 VITALS — BP 124/68 | HR 62 | Temp 97.8°F | Resp 18 | Wt 193.2 lb

## 2022-12-01 DIAGNOSIS — D631 Anemia in chronic kidney disease: Secondary | ICD-10-CM

## 2022-12-01 DIAGNOSIS — N1831 Chronic kidney disease, stage 3a: Secondary | ICD-10-CM

## 2022-12-01 DIAGNOSIS — D500 Iron deficiency anemia secondary to blood loss (chronic): Secondary | ICD-10-CM

## 2022-12-01 DIAGNOSIS — N183 Chronic kidney disease, stage 3 unspecified: Secondary | ICD-10-CM

## 2022-12-01 DIAGNOSIS — D696 Thrombocytopenia, unspecified: Secondary | ICD-10-CM

## 2022-12-01 DIAGNOSIS — D649 Anemia, unspecified: Secondary | ICD-10-CM

## 2022-12-01 DIAGNOSIS — E538 Deficiency of other specified B group vitamins: Secondary | ICD-10-CM | POA: Diagnosis not present

## 2022-12-01 DIAGNOSIS — I129 Hypertensive chronic kidney disease with stage 1 through stage 4 chronic kidney disease, or unspecified chronic kidney disease: Secondary | ICD-10-CM | POA: Diagnosis not present

## 2022-12-01 LAB — CBC
HCT: 34 % — ABNORMAL LOW (ref 39.0–52.0)
Hemoglobin: 10.4 g/dL — ABNORMAL LOW (ref 13.0–17.0)
MCH: 28.1 pg (ref 26.0–34.0)
MCHC: 30.6 g/dL (ref 30.0–36.0)
MCV: 91.9 fL (ref 80.0–100.0)
Platelets: 229 10*3/uL (ref 150–400)
RBC: 3.7 MIL/uL — ABNORMAL LOW (ref 4.22–5.81)
RDW: 16.2 % — ABNORMAL HIGH (ref 11.5–15.5)
WBC: 4.9 10*3/uL (ref 4.0–10.5)
nRBC: 0 % (ref 0.0–0.2)

## 2022-12-01 MED ORDER — CYANOCOBALAMIN 1000 MCG/ML IJ SOLN
1000.0000 ug | Freq: Once | INTRAMUSCULAR | Status: AC
  Administered 2022-12-01: 1000 ug via INTRAMUSCULAR
  Filled 2022-12-01: qty 1

## 2022-12-01 MED ORDER — EPOETIN ALFA 40000 UNIT/ML IJ SOLN
40000.0000 [IU] | Freq: Once | INTRAMUSCULAR | Status: AC
  Administered 2022-12-01: 40000 [IU] via SUBCUTANEOUS
  Filled 2022-12-01: qty 1

## 2022-12-01 NOTE — Progress Notes (Signed)
Patient presents today for B12 and Epogen injection. HGB 10.4 today. Vital signs within parameters for Epogen injection. Patient has no complaints today.   Epogen 40,000 units given today per MD orders. B12 given today. Tolerated without adverse affects. Vital signs stable. No complaints at this time. Discharged from clinic ambulatory in stable condition. Alert and oriented x 3. F/U with [facility] as scheduled.

## 2022-12-01 NOTE — Patient Instructions (Signed)
[place] at [facility] Discharge Instructions  You were seen today by Tarri Abernethy PA-C.  ANEMIA: Your blood levels are improved.  Your anemia may be coming from variety of factors:  -Anemia from chronic kidney disease  -Anemia from alcoholism  -Anemia from possible bone marrow disorder For the time being we will continue blood check + Retacrit injection every 2 weeks.  B12 DEFICIENCY: Continue B12 injections once per month.  ALCOHOL ABUSE:  There is an Alcoholics Anonymous group that meets at the following location at noon Monday through Friday.  [facility] (grey building, red shutters)  [place], [place] [address]  FOLLOW-UP APPOINTMENT: Repeat labs and follow-up visit in 3 months.   - - - - - - - - - - - - - - - - - -    Thank you for choosing [employer] [facility] to provide your oncology and hematology care.  To afford each patient quality time with our provider, please arrive at least 15 minutes before your scheduled appointment time.   If you have a lab appointment with the [facility] please come in thru the Main Entrance and check in at the main information desk.  You need to re-schedule your appointment should you arrive 10 or more minutes late.  We strive to give you quality time with our providers, and arriving late affects you and other patients whose appointments are after yours.  Also, if you no show three or more times for appointments you may be dismissed from the clinic at the providers discretion.     Again, thank you for choosing [facility]  Our hope is that these requests will decrease the amount of time that you wait before being seen by our physicians.       _____________________________________________________________  Should you have questions after your visit to [facility], please contact our office at [phone] and follow the prompts.  Our office  hours are 8:00 a.m. and 4:30 p.m. Monday - Friday.  Please note that voicemails left after 4:00 p.m. may not be returned until the following business day.  We are closed weekends and major holidays.  You do have access to a nurse 24-7, just call the main number to the clinic [phone] and do not press any options, hold on the line and a nurse will answer the phone.    For prescription refill requests, have your pharmacy contact our office and allow 72 hours.    Due to Covid, you will need to wear a mask upon entering the hospital. If you do not have a mask, a mask will be given to you at the Main Entrance upon arrival. For doctor visits, patients may have 1 support person age 68 or older with them. For treatment visits, patients can not have anyone with them due to social distancing guidelines and our immunocompromised population.

## 2022-12-01 NOTE — Patient Instructions (Signed)
[place]  Discharge Instructions: Thank you for choosing [facility] to provide your oncology and hematology care.  If you have a lab appointment with the [facility], please come in thru the Main Entrance and check in at the main information desk.  Wear comfortable clothing and clothing appropriate for easy access to any Portacath or PICC line.   We strive to give you quality time with your provider. You may need to reschedule your appointment if you arrive late (15 or more minutes).  Arriving late affects you and other patients whose appointments are after yours.  Also, if you miss three or more appointments without notifying the office, you may be dismissed from the clinic at the provider's discretion.      For prescription refill requests, have your pharmacy contact our office and allow 72 hours for refills to be completed.    Today you received the following chemotherapy and/or immunotherapy agents Epogen + B12 injection. Vitamin B12 Injection What is this medication? Vitamin B12 (VAHY tuh min B12) prevents and treats low vitamin B12 levels in your body. It is used in people who do not get enough vitamin B12 from their diet or when their digestive tract does not absorb enough. Vitamin B12 plays an important role in maintaining the health of your nervous system and red blood cells. This medicine may be used for other purposes; ask your health care provider or pharmacist if you have questions. COMMON BRAND NAME(S): B-12 Compliance Kit, B-12 Injection Kit, Cyomin, Dodex, LA-12, Nutri-Twelve, Physicians EZ Use B-12, Primabalt What should I tell my care team before I take this medication? They need to know if you have any of these conditions: Kidney disease Leber's disease Megaloblastic anemia An unusual or allergic reaction to cyanocobalamin, cobalt, other medications, foods, dyes, or preservatives Pregnant or trying to get pregnant Breast-feeding How  should I use this medication? This medication is injected into a muscle or deeply under the skin. It is usually given in a clinic or care team's office. However, your care team may teach you how to inject yourself. Follow all instructions. Talk to your care team about the use of this medication in children. Special care may be needed. Overdosage: If you think you have taken too much of this medicine contact a poison control center or emergency room at once. NOTE: This medicine is only for you. Do not share this medicine with others. What if I miss a dose? If you are given your dose at a clinic or care team's office, call to reschedule your appointment. If you give your own injections, and you miss a dose, take it as soon as you can. If it is almost time for your next dose, take only that dose. Do not take double or extra doses. What may interact with this medication? Alcohol Colchicine This list may not describe all possible interactions. Give your health care provider a list of all the medicines, herbs, non-prescription drugs, or dietary supplements you use. Also tell them if you smoke, drink alcohol, or use illegal drugs. Some items may interact with your medicine. What should I watch for while using this medication? Visit your care team regularly. You may need blood work done while you are taking this medication. You may need to follow a special diet. Talk to your care team. Limit your alcohol intake and avoid smoking to get the best benefit. What side effects may I notice from receiving this medication? Side effects that you  should report to your care team as soon as possible: Allergic reactions--skin rash, itching, hives, swelling of the face, lips, tongue, or throat Swelling of the ankles, hands, or feet Trouble breathing Side effects that usually do not require medical attention (report to your care team if they continue or are bothersome): Diarrhea This list may not describe all possible  side effects. Call your doctor for medical advice about side effects. You may report side effects to FDA at 1-800-FDA-1088. Where should I keep my medication? Keep out of the reach of children. Store at room temperature between 15 and 30 degrees C (59 and 85 degrees F). Protect from light. Throw away any unused medication after the expiration date. NOTE: This sheet is a summary. It may not cover all possible information. If you have questions about this medicine, talk to your doctor, pharmacist, or health care provider.  2023 Elsevier/Gold Standard (2008-01-22 00:00:00) Epoetin Alfa Injection What is this medication? EPOETIN ALFA (e POE e tin AL fa) treats low levels of red blood cells (anemia) caused by kidney disease, chemotherapy, or HIV medications. It can also be used in people who are at risk for blood loss during surgery. It works by Building control surveyor make more red blood cells, which reduces the need for blood transfusions. This medicine may be used for other purposes; ask your health care provider or pharmacist if you have questions. COMMON BRAND NAME(S): Epogen, Procrit, Retacrit What should I tell my care team before I take this medication? They need to know if you have any of these conditions: Blood clots Cancer Heart disease High blood pressure On dialysis Seizures Stroke An unusual or allergic reaction to epoetin alfa, albumin, benzyl alcohol, other medications, foods, dyes, or preservatives Pregnant or trying to get pregnant Breast-feeding How should I use this medication? This medication is injected into a vein or under the skin. It is usually given by your care team in a hospital or clinic setting. It may also be given at home. If you get this medication at home, you will be taught how to prepare and give it. Use exactly as directed. Take it as directed on the prescription label at the same time every day. Keep taking it unless your care team tells you to stop. It is  important that you put your used needles and syringes in a special sharps container. Do not put them in a trash can. If you do not have a sharps container, call your pharmacist or care team to get one. A special MedGuide will be given to you by the pharmacist with each prescription and refill. Be sure to read this information carefully each time. Talk to your care team about the use of this medication in children. While this medication may be used in children as young as 27 month of age for selected conditions, precautions do apply. Overdosage: If you think you have taken too much of this medicine contact a poison control center or emergency room at once. NOTE: This medicine is only for you. Do not share this medicine with others. What if I miss a dose? If you miss a dose, take it as soon as you can. If it is almost time for your next dose, take only that dose. Do not take double or extra doses. What may interact with this medication? Darbepoetin alfa Methoxy polyethylene glycol-epoetin beta This list may not describe all possible interactions. Give your health care provider a list of all the medicines, herbs, non-prescription drugs, or dietary  supplements you use. Also tell them if you smoke, drink alcohol, or use illegal drugs. Some items may interact with your medicine. What should I watch for while using this medication? Visit your care team for regular checks on your progress. Check your blood pressure as directed. Know what your blood pressure should be and when to contact your care team. Your condition will be monitored carefully while you are receiving this medication. You may need blood work while taking this medication. What side effects may I notice from receiving this medication? Side effects that you should report to your care team as soon as possible: Allergic reactions--skin rash, itching, hives, swelling of the face, lips, tongue, or throat Blood clot--pain, swelling, or warmth in the  leg, shortness of breath, chest pain Heart attack--pain or tightness in the chest, shoulders, arms, or jaw, nausea, shortness of breath, cold or clammy skin, feeling faint or lightheaded Increase in blood pressure Rash, fever, and swollen lymph nodes Redness, blistering, peeling, or loosening of the skin, including inside the mouth Seizures Stroke--sudden numbness or weakness of the face, arm, or leg, trouble speaking, confusion, trouble walking, loss of balance or coordination, dizziness, severe headache, change in vision Side effects that usually do not require medical attention (report to your care team if they continue or are bothersome): Bone, joint, or muscle pain Cough Headache Nausea Pain, redness, or irritation at injection site This list may not describe all possible side effects. Call your doctor for medical advice about side effects. You may report side effects to FDA at 1-800-FDA-1088. Where should I keep my medication? Keep out of the reach of children and pets. Store in a refrigerator. Do not freeze. Do not shake. Protect from light. Keep this medication in the original container until you are ready to take it. See product for storage information. Get rid of any unused medication after the expiration date. To get rid of medications that are no longer needed or have expired: Take the medication to a medication take-back program. Check with your pharmacy or law enforcement to find a location. If you cannot return the medication, ask your pharmacist or care team how to get rid of the medication safely. NOTE: This sheet is a summary. It may not cover all possible information. If you have questions about this medicine, talk to your doctor, pharmacist, or health care provider.  2023 Elsevier/Gold Standard (2022-03-11 00:00:00)       To help prevent nausea and vomiting after your treatment, we encourage you to take your nausea medication as directed.  BELOW ARE SYMPTOMS THAT SHOULD  BE REPORTED IMMEDIATELY: *FEVER GREATER THAN 100.4 F (38 C) OR HIGHER *CHILLS OR SWEATING *NAUSEA AND VOMITING THAT IS NOT CONTROLLED WITH YOUR NAUSEA MEDICATION *UNUSUAL SHORTNESS OF BREATH *UNUSUAL BRUISING OR BLEEDING *URINARY PROBLEMS (pain or burning when urinating, or frequent urination) *BOWEL PROBLEMS (unusual diarrhea, constipation, pain near the anus) TENDERNESS IN MOUTH AND THROAT WITH OR WITHOUT PRESENCE OF ULCERS (sore throat, sores in mouth, or a toothache) UNUSUAL RASH, SWELLING OR PAIN  UNUSUAL VAGINAL DISCHARGE OR ITCHING   Items with * indicate a potential emergency and should be followed up as soon as possible or go to the Emergency Department if any problems should occur.  Please show the CHEMOTHERAPY ALERT CARD or IMMUNOTHERAPY ALERT CARD at check-in to the Emergency Department and triage nurse.  Should you have questions after your visit or need to cancel or reschedule your appointment, please contact [facility] [phone]  and follow the prompts.  Office hours are 8:00 a.m. to 4:30 p.m. Monday - Friday. Please note that voicemails left after 4:00 p.m. may not be returned until the following business day.  We are closed weekends and major holidays. You have access to a nurse at all times for urgent questions. Please call the main number to the clinic [phone] and follow the prompts.  For any non-urgent questions, you may also contact your provider using MyChart. We now offer e-Visits for anyone 73 and older to request care online for non-urgent symptoms. For details visit [email or web address].   Also download the MyChart app! Go to the app store, search "MyChart", open the app, select [facility], and log in with your MyChart username and password.  Masks are optional in the cancer centers. If you would like for your care team to wear a mask while they are taking care of you, please let them know. You may have one support person who is at  least 74 years old accompany you for your appointments.

## 2022-12-17 ENCOUNTER — Inpatient Hospital Stay: Payer: Medicare Other | Attending: Hematology

## 2022-12-17 ENCOUNTER — Inpatient Hospital Stay: Payer: Medicare Other

## 2022-12-17 VITALS — BP 118/59 | HR 62 | Temp 97.5°F | Resp 16

## 2022-12-17 DIAGNOSIS — D509 Iron deficiency anemia, unspecified: Secondary | ICD-10-CM | POA: Diagnosis not present

## 2022-12-17 DIAGNOSIS — E538 Deficiency of other specified B group vitamins: Secondary | ICD-10-CM | POA: Diagnosis not present

## 2022-12-17 DIAGNOSIS — I129 Hypertensive chronic kidney disease with stage 1 through stage 4 chronic kidney disease, or unspecified chronic kidney disease: Secondary | ICD-10-CM | POA: Diagnosis not present

## 2022-12-17 DIAGNOSIS — D631 Anemia in chronic kidney disease: Secondary | ICD-10-CM | POA: Diagnosis not present

## 2022-12-17 DIAGNOSIS — N1831 Chronic kidney disease, stage 3a: Secondary | ICD-10-CM | POA: Diagnosis not present

## 2022-12-17 DIAGNOSIS — D696 Thrombocytopenia, unspecified: Secondary | ICD-10-CM

## 2022-12-17 DIAGNOSIS — D500 Iron deficiency anemia secondary to blood loss (chronic): Secondary | ICD-10-CM

## 2022-12-17 DIAGNOSIS — D649 Anemia, unspecified: Secondary | ICD-10-CM

## 2022-12-17 DIAGNOSIS — N183 Chronic kidney disease, stage 3 unspecified: Secondary | ICD-10-CM

## 2022-12-17 LAB — CBC
HCT: 35.3 % — ABNORMAL LOW (ref 39.0–52.0)
Hemoglobin: 10.7 g/dL — ABNORMAL LOW (ref 13.0–17.0)
MCH: 27.9 pg (ref 26.0–34.0)
MCHC: 30.3 g/dL (ref 30.0–36.0)
MCV: 92.2 fL (ref 80.0–100.0)
Platelets: 228 10*3/uL (ref 150–400)
RBC: 3.83 MIL/uL — ABNORMAL LOW (ref 4.22–5.81)
RDW: 16.7 % — ABNORMAL HIGH (ref 11.5–15.5)
WBC: 4.4 10*3/uL (ref 4.0–10.5)
nRBC: 0 % (ref 0.0–0.2)

## 2022-12-17 MED ORDER — EPOETIN ALFA 40000 UNIT/ML IJ SOLN
40000.0000 [IU] | Freq: Once | INTRAMUSCULAR | Status: AC
  Administered 2022-12-17: 40000 [IU] via SUBCUTANEOUS
  Filled 2022-12-17: qty 1

## 2022-12-17 NOTE — Patient Instructions (Signed)
[facility]  Discharge Instructions: Thank you for choosing [facility] to provide your oncology and hematology care.  If you have a lab appointment with the Cancer Center, please come in thru the Main Entrance and check in at the main information desk.  Wear comfortable clothing and clothing appropriate for easy access to any Portacath or PICC line.   We strive to give you quality time with your provider. You may need to reschedule your appointment if you arrive late (15 or more minutes).  Arriving late affects you and other patients whose appointments are after yours.  Also, if you miss three or more appointments without notifying the office, you may be dismissed from the clinic at the provider's discretion.      For prescription refill requests, have your pharmacy contact our office and allow 72 hours for refills to be completed.    Epoetin Alfa Injection What is this medication? EPOETIN ALFA (e POE e tin AL fa) treats low levels of red blood cells (anemia) caused by kidney disease, chemotherapy, or HIV medications. It can also be used in people who are at risk for blood loss during surgery. It works by helping your body make more red blood cells, which reduces the need for blood transfusions. This medicine may be used for other purposes; ask your health care provider or pharmacist if you have questions. COMMON BRAND NAME(S): Epogen, Procrit, Retacrit What should I tell my care team before I take this medication? They need to know if you have any of these conditions: Blood clots Cancer Heart disease High blood pressure On dialysis Seizures Stroke An unusual or allergic reaction to epoetin alfa, albumin, benzyl alcohol, other medications, foods, dyes, or preservatives Pregnant or trying to get pregnant Breast-feeding How should I use this medication? This medication is injected into a vein or under the skin. It is usually given by your care team in a  hospital or clinic setting. It may also be given at home. If you get this medication at home, you will be taught how to prepare and give it. Use exactly as directed. Take it as directed on the prescription label at the same time every day. Keep taking it unless your care team tells you to stop. It is important that you put your used needles and syringes in a special sharps container. Do not put them in a trash can. If you do not have a sharps container, call your pharmacist or care team to get one. A special MedGuide will be given to you by the pharmacist with each prescription and refill. Be sure to read this information carefully each time. Talk to your care team about the use of this medication in children. While this medication may be used in children as young as 1 month of age for selected conditions, precautions do apply. Overdosage: If you think you have taken too much of this medicine contact a poison control center or emergency room at once. NOTE: This medicine is only for you. Do not share this medicine with others. What if I miss a dose? If you miss a dose, take it as soon as you can. If it is almost time for your next dose, take only that dose. Do not take double or extra doses. What may interact with this medication? Darbepoetin alfa Methoxy polyethylene glycol-epoetin beta This list may not describe all possible interactions. Give your health care provider a list of all the medicines, herbs, non-prescription drugs, or dietary   supplements you use. Also tell them if you smoke, drink alcohol, or use illegal drugs. Some items may interact with your medicine. What should I watch for while using this medication? Visit your care team for regular checks on your progress. Check your blood pressure as directed. Know what your blood pressure should be and when to contact your care team. Your condition will be monitored carefully while you are receiving this medication. You may need blood work while  taking this medication. What side effects may I notice from receiving this medication? Side effects that you should report to your care team as soon as possible: Allergic reactions--skin rash, itching, hives, swelling of the face, lips, tongue, or throat Blood clot--pain, swelling, or warmth in the leg, shortness of breath, chest pain Heart attack--pain or tightness in the chest, shoulders, arms, or jaw, nausea, shortness of breath, cold or clammy skin, feeling faint or lightheaded Increase in blood pressure Rash, fever, and swollen lymph nodes Redness, blistering, peeling, or loosening of the skin, including inside the mouth Seizures Stroke--sudden numbness or weakness of the face, arm, or leg, trouble speaking, confusion, trouble walking, loss of balance or coordination, dizziness, severe headache, change in vision Side effects that usually do not require medical attention (report to your care team if they continue or are bothersome): Bone, joint, or muscle pain Cough Headache Nausea Pain, redness, or irritation at injection site This list may not describe all possible side effects. Call your doctor for medical advice about side effects. You may report side effects to FDA at 1-800-FDA-1088. Where should I keep my medication? Keep out of the reach of children and pets. Store in a refrigerator. Do not freeze. Do not shake. Protect from light. Keep this medication in the original container until you are ready to take it. See product for storage information. Get rid of any unused medication after the expiration date. To get rid of medications that are no longer needed or have expired: Take the medication to a medication take-back program. Check with your pharmacy or law enforcement to find a location. If you cannot return the medication, ask your pharmacist or care team how to get rid of the medication safely. NOTE: This sheet is a summary. It may not cover all possible information. If you have  questions about this medicine, talk to your doctor, pharmacist, or health care provider.  2023 Elsevier/Gold Standard (2022-03-11 00:00:00)    To help prevent nausea and vomiting after your treatment, we encourage you to take your nausea medication as directed.  BELOW ARE SYMPTOMS THAT SHOULD BE REPORTED IMMEDIATELY: *FEVER GREATER THAN 100.4 F (38 C) OR HIGHER *CHILLS OR SWEATING *NAUSEA AND VOMITING THAT IS NOT CONTROLLED WITH YOUR NAUSEA MEDICATION *UNUSUAL SHORTNESS OF BREATH *UNUSUAL BRUISING OR BLEEDING *URINARY PROBLEMS (pain or burning when urinating, or frequent urination) *BOWEL PROBLEMS (unusual diarrhea, constipation, pain near the anus) TENDERNESS IN MOUTH AND THROAT WITH OR WITHOUT PRESENCE OF ULCERS (sore throat, sores in mouth, or a toothache) UNUSUAL RASH, SWELLING OR PAIN  UNUSUAL VAGINAL DISCHARGE OR ITCHING   Items with * indicate a potential emergency and should be followed up as soon as possible or go to the Emergency Department if any problems should occur.  Please show the CHEMOTHERAPY ALERT CARD or IMMUNOTHERAPY ALERT CARD at check-in to the Emergency Department and triage nurse.  Should you have questions after your visit or need to cancel or reschedule your appointment, please contact [facility] [phone]  and follow the   prompts.  Office hours are 8:00 a.m. to 4:30 p.m. Monday - Friday. Please note that voicemails left after 4:00 p.m. may not be returned until the following business day.  We are closed weekends and major holidays. You have access to a nurse at all times for urgent questions. Please call the main number to the clinic [phone] and follow the prompts.  For any non-urgent questions, you may also contact your provider using MyChart. We now offer e-Visits for anyone 18 and older to request care online for non-urgent symptoms. For details visit [email or web address].   Also download the MyChart app! Go to the app store,  search "MyChart", open the app, select [facility], and log in with your MyChart username and password.   

## 2022-12-17 NOTE — Progress Notes (Signed)
Patient tolerated Retacrit 40,000U injection with no complaints voiced.  Hemoglobin today is 10.7.  Site clean and dry with no bruising or swelling noted.  No complaints of pain.  Discharged with vital signs stable and no signs or symptoms of distress noted.

## 2022-12-23 ENCOUNTER — Ambulatory Visit (INDEPENDENT_AMBULATORY_CARE_PROVIDER_SITE_OTHER): Payer: Medicare Other | Admitting: Gastroenterology

## 2022-12-31 ENCOUNTER — Inpatient Hospital Stay: Payer: Medicare Other

## 2022-12-31 VITALS — BP 128/64 | HR 57 | Temp 98.2°F | Resp 18

## 2022-12-31 DIAGNOSIS — D500 Iron deficiency anemia secondary to blood loss (chronic): Secondary | ICD-10-CM

## 2022-12-31 DIAGNOSIS — D509 Iron deficiency anemia, unspecified: Secondary | ICD-10-CM | POA: Diagnosis not present

## 2022-12-31 DIAGNOSIS — I129 Hypertensive chronic kidney disease with stage 1 through stage 4 chronic kidney disease, or unspecified chronic kidney disease: Secondary | ICD-10-CM | POA: Diagnosis not present

## 2022-12-31 DIAGNOSIS — D696 Thrombocytopenia, unspecified: Secondary | ICD-10-CM

## 2022-12-31 DIAGNOSIS — E538 Deficiency of other specified B group vitamins: Secondary | ICD-10-CM | POA: Diagnosis not present

## 2022-12-31 DIAGNOSIS — N183 Chronic kidney disease, stage 3 unspecified: Secondary | ICD-10-CM

## 2022-12-31 DIAGNOSIS — N1831 Chronic kidney disease, stage 3a: Secondary | ICD-10-CM | POA: Diagnosis not present

## 2022-12-31 DIAGNOSIS — D649 Anemia, unspecified: Secondary | ICD-10-CM

## 2022-12-31 DIAGNOSIS — D631 Anemia in chronic kidney disease: Secondary | ICD-10-CM | POA: Diagnosis not present

## 2022-12-31 LAB — CBC
HCT: 34.9 % — ABNORMAL LOW (ref 39.0–52.0)
Hemoglobin: 10.7 g/dL — ABNORMAL LOW (ref 13.0–17.0)
MCH: 27.7 pg (ref 26.0–34.0)
MCHC: 30.7 g/dL (ref 30.0–36.0)
MCV: 90.4 fL (ref 80.0–100.0)
Platelets: 252 10*3/uL (ref 150–400)
RBC: 3.86 MIL/uL — ABNORMAL LOW (ref 4.22–5.81)
RDW: 17.3 % — ABNORMAL HIGH (ref 11.5–15.5)
WBC: 4.5 10*3/uL (ref 4.0–10.5)
nRBC: 0 % (ref 0.0–0.2)

## 2022-12-31 MED ORDER — CYANOCOBALAMIN 1000 MCG/ML IJ SOLN
1000.0000 ug | Freq: Once | INTRAMUSCULAR | Status: AC
  Administered 2022-12-31: 1000 ug via INTRAMUSCULAR
  Filled 2022-12-31: qty 1

## 2022-12-31 MED ORDER — EPOETIN ALFA 40000 UNIT/ML IJ SOLN
40000.0000 [IU] | Freq: Once | INTRAMUSCULAR | Status: AC
  Administered 2022-12-31: 40000 [IU] via SUBCUTANEOUS
  Filled 2022-12-31: qty 1

## 2022-12-31 NOTE — Progress Notes (Signed)
Procrit and B12 injections given per orders. Patient tolerated it well without problems. Vitals stable and discharged home from clinic ambulatory. Follow up as scheduled.

## 2022-12-31 NOTE — Patient Instructions (Signed)
[facility]  Discharge Instructions: Thank you for choosing [facility] to provide your oncology and hematology care.  If you have a lab appointment with the [facility], please come in thru the Main Entrance and check in at the main information desk.  Wear comfortable clothing and clothing appropriate for easy access to any Portacath or PICC line.   We strive to give you quality time with your provider. You may need to reschedule your appointment if you arrive late (15 or more minutes).  Arriving late affects you and other patients whose appointments are after yours.  Also, if you miss three or more appointments without notifying the office, you may be dismissed from the clinic at the provider's discretion.      For prescription refill requests, have your pharmacy contact our office and allow 72 hours for refills to be completed.    Today you received the following , B12 injection and procrit injection    To help prevent nausea and vomiting after your treatment, we encourage you to take your nausea medication as directed.  BELOW ARE SYMPTOMS THAT SHOULD BE REPORTED IMMEDIATELY: *FEVER GREATER THAN 100.4 F (38 C) OR HIGHER *CHILLS OR SWEATING *NAUSEA AND VOMITING THAT IS NOT CONTROLLED WITH YOUR NAUSEA MEDICATION *UNUSUAL SHORTNESS OF BREATH *UNUSUAL BRUISING OR BLEEDING *URINARY PROBLEMS (pain or burning when urinating, or frequent urination) *BOWEL PROBLEMS (unusual diarrhea, constipation, pain near the anus) TENDERNESS IN MOUTH AND THROAT WITH OR WITHOUT PRESENCE OF ULCERS (sore throat, sores in mouth, or a toothache) UNUSUAL RASH, SWELLING OR PAIN  UNUSUAL VAGINAL DISCHARGE OR ITCHING   Items with * indicate a potential emergency and should be followed up as soon as possible or go to the Emergency Department if any problems should occur.  Please show the CHEMOTHERAPY ALERT CARD or IMMUNOTHERAPY ALERT CARD at check-in to the Emergency Department  and triage nurse.  Should you have questions after your visit or need to cancel or reschedule your appointment, please contact [facility] [phone]  and follow the prompts.  Office hours are 8:00 a.m. to 4:30 p.m. Monday - Friday. Please note that voicemails left after 4:00 p.m. may not be returned until the following business day.  We are closed weekends and major holidays. You have access to a nurse at all times for urgent questions. Please call the main number to the clinic [phone] and follow the prompts.  For any non-urgent questions, you may also contact your provider using MyChart. We now offer e-Visits for anyone 43 and older to request care online for non-urgent symptoms. For details visit [email or web address].   Also download the MyChart app! Go to the app store, search "MyChart", open the app, select [facility], and log in with your MyChart username and password.

## 2023-01-14 ENCOUNTER — Other Ambulatory Visit (HOSPITAL_COMMUNITY)
Admission: RE | Admit: 2023-01-14 | Discharge: 2023-01-14 | Disposition: A | Discharge status: Home / Self Care | Payer: Medicare Other | Source: Ambulatory Visit | Attending: Physician Assistant | Admitting: Physician Assistant

## 2023-01-14 ENCOUNTER — Inpatient Hospital Stay: Payer: Medicare Other

## 2023-01-14 DIAGNOSIS — D696 Thrombocytopenia, unspecified: Secondary | ICD-10-CM

## 2023-01-14 DIAGNOSIS — D500 Iron deficiency anemia secondary to blood loss (chronic): Secondary | ICD-10-CM | POA: Diagnosis not present

## 2023-01-14 LAB — CBC
HCT: 35 % — ABNORMAL LOW (ref 39.0–52.0)
Hemoglobin: 11.4 g/dL — ABNORMAL LOW (ref 13.0–17.0)
MCH: 28.7 pg (ref 26.0–34.0)
MCHC: 32.6 g/dL (ref 30.0–36.0)
MCV: 88.2 fL (ref 80.0–100.0)
Platelets: 167 10*3/uL (ref 150–400)
RBC: 3.97 MIL/uL — ABNORMAL LOW (ref 4.22–5.81)
RDW: 17.9 % — ABNORMAL HIGH (ref 11.5–15.5)
WBC: 7.6 10*3/uL (ref 4.0–10.5)
nRBC: 0.3 % — ABNORMAL HIGH (ref 0.0–0.2)

## 2023-01-14 NOTE — Progress Notes (Signed)
Patient's hemoglobin 11.4, no injection needed today. Patient made aware and left in satisfactory condition.

## 2023-01-28 ENCOUNTER — Inpatient Hospital Stay: Payer: Medicare Other

## 2023-01-28 ENCOUNTER — Inpatient Hospital Stay: Payer: Medicare Other | Attending: Hematology

## 2023-01-28 DIAGNOSIS — D631 Anemia in chronic kidney disease: Secondary | ICD-10-CM | POA: Insufficient documentation

## 2023-01-28 DIAGNOSIS — N182 Chronic kidney disease, stage 2 (mild): Secondary | ICD-10-CM | POA: Insufficient documentation

## 2023-01-28 DIAGNOSIS — I129 Hypertensive chronic kidney disease with stage 1 through stage 4 chronic kidney disease, or unspecified chronic kidney disease: Secondary | ICD-10-CM | POA: Insufficient documentation

## 2023-01-28 DIAGNOSIS — Z87891 Personal history of nicotine dependence: Secondary | ICD-10-CM | POA: Insufficient documentation

## 2023-01-28 DIAGNOSIS — E538 Deficiency of other specified B group vitamins: Secondary | ICD-10-CM | POA: Insufficient documentation

## 2023-02-11 ENCOUNTER — Inpatient Hospital Stay: Payer: Medicare Other

## 2023-02-11 VITALS — BP 144/62 | HR 60 | Temp 96.8°F | Resp 16

## 2023-02-11 DIAGNOSIS — D500 Iron deficiency anemia secondary to blood loss (chronic): Secondary | ICD-10-CM

## 2023-02-11 DIAGNOSIS — D696 Thrombocytopenia, unspecified: Secondary | ICD-10-CM

## 2023-02-11 DIAGNOSIS — D649 Anemia, unspecified: Secondary | ICD-10-CM

## 2023-02-11 DIAGNOSIS — N183 Chronic kidney disease, stage 3 unspecified: Secondary | ICD-10-CM

## 2023-02-11 DIAGNOSIS — N182 Chronic kidney disease, stage 2 (mild): Secondary | ICD-10-CM | POA: Diagnosis not present

## 2023-02-11 DIAGNOSIS — Z87891 Personal history of nicotine dependence: Secondary | ICD-10-CM | POA: Diagnosis not present

## 2023-02-11 DIAGNOSIS — E538 Deficiency of other specified B group vitamins: Secondary | ICD-10-CM | POA: Diagnosis not present

## 2023-02-11 DIAGNOSIS — I129 Hypertensive chronic kidney disease with stage 1 through stage 4 chronic kidney disease, or unspecified chronic kidney disease: Secondary | ICD-10-CM | POA: Diagnosis not present

## 2023-02-11 DIAGNOSIS — D631 Anemia in chronic kidney disease: Secondary | ICD-10-CM | POA: Diagnosis not present

## 2023-02-11 LAB — CBC
HCT: 31.1 % — ABNORMAL LOW (ref 39.0–52.0)
Hemoglobin: 9.5 g/dL — ABNORMAL LOW (ref 13.0–17.0)
MCH: 29.1 pg (ref 26.0–34.0)
MCHC: 30.5 g/dL (ref 30.0–36.0)
MCV: 95.1 fL (ref 80.0–100.0)
Platelets: 209 10*3/uL (ref 150–400)
RBC: 3.27 MIL/uL — ABNORMAL LOW (ref 4.22–5.81)
RDW: 17.5 % — ABNORMAL HIGH (ref 11.5–15.5)
WBC: 4.5 10*3/uL (ref 4.0–10.5)
nRBC: 0 % (ref 0.0–0.2)

## 2023-02-11 MED ORDER — EPOETIN ALFA 40000 UNIT/ML IJ SOLN
40000.0000 [IU] | Freq: Once | INTRAMUSCULAR | Status: AC
  Administered 2023-02-11: 40000 [IU] via SUBCUTANEOUS
  Filled 2023-02-11: qty 1

## 2023-02-11 MED ORDER — CYANOCOBALAMIN 1000 MCG/ML IJ SOLN
1000.0000 ug | Freq: Once | INTRAMUSCULAR | Status: AC
  Administered 2023-02-11: 1000 ug via INTRAMUSCULAR
  Filled 2023-02-11: qty 1

## 2023-02-11 NOTE — Progress Notes (Signed)
Patient presents today for Retacrit and B12  injection. Hemoglobin reviewed prior to administration. VSS tolerated without incident or complaint. See MAR for details. Patient stable during and after injection. Patient discharged in satisfactory condition with no s/s of distress noted.

## 2023-02-11 NOTE — Patient Instructions (Signed)
[facility]  Discharge Instructions: Thank you for choosing [facility] to provide your oncology and hematology care.  If you have a lab appointment with the [facility], please come in thru the Main Entrance and check in at the main information desk.  Wear comfortable clothing and clothing appropriate for easy access to any Portacath or PICC line.   We strive to give you quality time with your provider. You may need to reschedule your appointment if you arrive late (15 or more minutes).  Arriving late affects you and other patients whose appointments are after yours.  Also, if you miss three or more appointments without notifying the office, you may be dismissed from the clinic at the provider's discretion.      For prescription refill requests, have your pharmacy contact our office and allow 72 hours for refills to be completed.    Today you received the following B12 and Retacrit, return as scheduled.   To help prevent nausea and vomiting after your treatment, we encourage you to take your nausea medication as directed.  BELOW ARE SYMPTOMS THAT SHOULD BE REPORTED IMMEDIATELY: *FEVER GREATER THAN 100.4 F (38 C) OR HIGHER *CHILLS OR SWEATING *NAUSEA AND VOMITING THAT IS NOT CONTROLLED WITH YOUR NAUSEA MEDICATION *UNUSUAL SHORTNESS OF BREATH *UNUSUAL BRUISING OR BLEEDING *URINARY PROBLEMS (pain or burning when urinating, or frequent urination) *BOWEL PROBLEMS (unusual diarrhea, constipation, pain near the anus) TENDERNESS IN MOUTH AND THROAT WITH OR WITHOUT PRESENCE OF ULCERS (sore throat, sores in mouth, or a toothache) UNUSUAL RASH, SWELLING OR PAIN  UNUSUAL VAGINAL DISCHARGE OR ITCHING   Items with * indicate a potential emergency and should be followed up as soon as possible or go to the Emergency Department if any problems should occur.  Please show the CHEMOTHERAPY ALERT CARD or IMMUNOTHERAPY ALERT CARD at check-in to the Emergency Department  and triage nurse.  Should you have questions after your visit or need to cancel or reschedule your appointment, please contact [facility] [phone]  and follow the prompts.  Office hours are 8:00 a.m. to 4:30 p.m. Monday - Friday. Please note that voicemails left after 4:00 p.m. may not be returned until the following business day.  We are closed weekends and major holidays. You have access to a nurse at all times for urgent questions. Please call the main number to the clinic [phone] and follow the prompts.  For any non-urgent questions, you may also contact your provider using MyChart. We now offer e-Visits for anyone 44 and older to request care online for non-urgent symptoms. For details visit [email or web address].   Also download the MyChart app! Go to the app store, search "MyChart", open the app, select [facility], and log in with your MyChart username and password.

## 2023-02-25 ENCOUNTER — Inpatient Hospital Stay: Payer: Medicare Other | Attending: Hematology

## 2023-02-25 ENCOUNTER — Inpatient Hospital Stay: Payer: Medicare Other

## 2023-02-25 DIAGNOSIS — D696 Thrombocytopenia, unspecified: Secondary | ICD-10-CM

## 2023-02-25 DIAGNOSIS — N182 Chronic kidney disease, stage 2 (mild): Secondary | ICD-10-CM | POA: Insufficient documentation

## 2023-02-25 DIAGNOSIS — D631 Anemia in chronic kidney disease: Secondary | ICD-10-CM | POA: Diagnosis not present

## 2023-02-25 DIAGNOSIS — E538 Deficiency of other specified B group vitamins: Secondary | ICD-10-CM | POA: Insufficient documentation

## 2023-02-25 DIAGNOSIS — I129 Hypertensive chronic kidney disease with stage 1 through stage 4 chronic kidney disease, or unspecified chronic kidney disease: Secondary | ICD-10-CM | POA: Insufficient documentation

## 2023-02-25 DIAGNOSIS — D500 Iron deficiency anemia secondary to blood loss (chronic): Secondary | ICD-10-CM

## 2023-02-25 LAB — CBC
HCT: 35.7 % — ABNORMAL LOW (ref 39.0–52.0)
Hemoglobin: 11.4 g/dL — ABNORMAL LOW (ref 13.0–17.0)
MCH: 28.9 pg (ref 26.0–34.0)
MCHC: 31.9 g/dL (ref 30.0–36.0)
MCV: 90.4 fL (ref 80.0–100.0)
Platelets: 208 10*3/uL (ref 150–400)
RBC: 3.95 MIL/uL — ABNORMAL LOW (ref 4.22–5.81)
RDW: 17.6 % — ABNORMAL HIGH (ref 11.5–15.5)
WBC: 10.8 10*3/uL — ABNORMAL HIGH (ref 4.0–10.5)
nRBC: 0.2 % (ref 0.0–0.2)

## 2023-02-25 NOTE — Progress Notes (Signed)
Pt's hemoglobin noted to be 11.4 today. No injection needed today. Pt made aware and left ambulatory in stable condition.

## 2023-03-03 ENCOUNTER — Ambulatory Visit (INDEPENDENT_AMBULATORY_CARE_PROVIDER_SITE_OTHER): Payer: Medicare Other | Admitting: Gastroenterology

## 2023-03-03 ENCOUNTER — Encounter (INDEPENDENT_AMBULATORY_CARE_PROVIDER_SITE_OTHER): Payer: Self-pay | Admitting: Gastroenterology

## 2023-03-03 VITALS — BP 159/83 | HR 73 | Temp 98.1°F | Ht 64.0 in | Wt 196.3 lb

## 2023-03-03 DIAGNOSIS — K219 Gastro-esophageal reflux disease without esophagitis: Secondary | ICD-10-CM | POA: Diagnosis not present

## 2023-03-03 DIAGNOSIS — D500 Iron deficiency anemia secondary to blood loss (chronic): Secondary | ICD-10-CM | POA: Diagnosis not present

## 2023-03-03 DIAGNOSIS — R110 Nausea: Secondary | ICD-10-CM | POA: Diagnosis not present

## 2023-03-03 MED ORDER — OMEPRAZOLE 40 MG PO CPDR: 40.0000 mg | DELAYED_RELEASE_CAPSULE | Freq: Two times a day (BID) | ORAL | 5 refills | Status: DC

## 2023-03-03 NOTE — Progress Notes (Signed)
Referring Provider: Curlene Labrum, MD Primary Care Physician:  Curlene Labrum, MD Primary GI Physician: Jenetta Downer   Chief Complaint  Patient presents with   Anemia    Follow up on anemia, abdominal pain, nausea, vomiting and dysphagia. Reports pain, nausea and vomiting have cleared up and he has some trouble with swallow but better.    HPI:   Roger SHERBURN is a 77 y.o. male with past medical history of  alcohol abuse, anemia, cardiomyopathy, CKD stage 3, GERD, colon polyps, High cholesterol, HTN.   Patient presenting today for follow up of GERD/nausea/dysphagia.   Last seen October 2023, at that time, having looser to harder stools. Having a BM every day to every other day. Sometimes when loose stools occur. taking stool softener daily with good results. He endorse mid to lower abdominal pain that occurs almost daily, having a BM sometimes improves his abdominal pain.     He notes some nausea in the mornings upon waking, this has been present for some times. He notes issues with heartburn, takes omeprazole 20mg  daily with 2-3 episodes of breakthrough symptoms per week.  some dysphagia with foods sticking just above sternal notch.    Recommended to take omeprazole 40mg  daily, EGD, colonoscopy, decrease alcohol.  Present:  Swallowing is improved since EGD. He is having very minimal issues with dysphagia. Still having some heartburn, taking PPI just once a day. He takes mylanta PRN for breakthrough symptoms, doing this maybe once per day. He will take mylanta if he eats late, noting he takes it often as a preventative depending on what he ate and how late.   Is still having some nausea, but unsure if this is coming from sinus drainage. Notes he only has this on occasion now. No vomiting. Appetite is good. Weight is stable.   Last labs on 02/25/23 with hgb 11.4. no rectal bleeding, has dark stools, maintained on Iron pills once daily. He is receiving Retacrit every 2 weeks now.  followed by hematology at [facility].   He continues to have a mix of loose stools and constipation. He is taking pepto bismol when stools are looser, takes a stool softener when he is feeling constipated. No abdominal pain.  He is still drinking some alcohol, has decreased intake. He does not drink daily. He is drinking mostly liquor though unsure the amount. He is trying to quit.   Last Colonoscopy:11/2022  - A single non-bleeding colonic angiodysplastic                            lesion. Treated with argon plasma coagulation (APC).                           - Three 2 to 5 mm polyps in the transverse colon                            and in the cecum, removed with a cold snare.                            Resected and retrieved.                           - One 2 mm polyp in the transverse colon, removed  with a cold biopsy forceps. Resected and retrieved.                           - Diverticulosis in the sigmoid colon. Normal colon                            biopsied.                           - The distal rectum and anal verge are normal on                            retroflexion view.  Last Endoscopy: 11/2022 - No endoscopic esophageal abnormality to explain                            patient's dysphagia. Esophagus dilated. Dilated.                           - Erythematous mucosa in the antrum. Biopsied.                           - Duodenitis. Periampullar area biopsied.  Recommendations:  Repeat TCS 5 years   Past Medical History:  Diagnosis Date   Alcohol abuse 11/24/2016   Anemia 10/11/2013   Cardiomyopathy ([place])    a. EF 50% by echo in 2019 with NST showing no reversible ischemia and thought to be alcohol-induced   Chronic renal disease, stage 3, moderately decreased glomerular filtration rate (GFR) between 30-59 mL/min/1.73 square meter (HCC) 12/02/2016   Colon polyps    GERD (gastroesophageal reflux disease)    High cholesterol    Hypertension    Sinus  infection     Past Surgical History:  Procedure Laterality Date   BIOPSY  06/03/2019   Procedure: BIOPSY;  Surgeon: Rogene Houston, MD;  [facility];  Service: Endoscopy;;  esophagus   BIOPSY N/A 11/20/2022   Procedure: RANDOM BIOPSIES;  Surgeon: Harvel Quale, MD;  [facility];  Service: Gastroenterology;  Laterality: N/A;   COLONOSCOPY N/A 10/26/2013   Procedure: COLONOSCOPY;  Surgeon: Rogene Houston, MD;  [facility];  Service: Endoscopy;  Laterality: N/A;  325-moved to 1230 Ann to notify pt   Colonoscopy with polypectomy     COLONOSCOPY WITH PROPOFOL N/A 06/03/2019   Procedure: COLONOSCOPY WITH PROPOFOL;  Surgeon: Rogene Houston, MD;  [facility];  Service: Endoscopy;  Laterality: N/A;   COLONOSCOPY WITH PROPOFOL N/A 11/20/2022   Procedure: COLONOSCOPY WITH PROPOFOL;  Surgeon: Harvel Quale, MD;  [facility];  Service: Gastroenterology;  Laterality: N/A;  730 ASA 3   ESOPHAGEAL DILATION N/A 11/20/2022   Procedure: ESOPHAGEAL DILATION;  Surgeon: Harvel Quale, MD;  [facility];  Service: Gastroenterology;  Laterality: N/A;   ESOPHAGOGASTRODUODENOSCOPY (EGD) WITH PROPOFOL N/A 06/03/2019   Procedure: ESOPHAGOGASTRODUODENOSCOPY (EGD) WITH PROPOFOL;  Surgeon: Rogene Houston, MD;  [facility];  Service: Endoscopy;  Laterality: N/A;   ESOPHAGOGASTRODUODENOSCOPY (EGD) WITH PROPOFOL N/A 11/20/2022   Procedure: ESOPHAGOGASTRODUODENOSCOPY (EGD) WITH PROPOFOL;  Surgeon: Harvel Quale, MD;  [facility];  Service: Gastroenterology;  Laterality: N/A;   HOT  HEMOSTASIS  11/20/2022   Procedure: HOT HEMOSTASIS (ARGON PLASMA COAGULATION/BICAP);  Surgeon: Montez Morita, Quillian Quince, MD;  [facility];  Service: Gastroenterology;;   POLYPECTOMY  06/03/2019   Procedure: POLYPECTOMY;  Surgeon: Rogene Houston, MD;  [facility];  Service: Endoscopy;;   colon   POLYPECTOMY  11/20/2022   Procedure: POLYPECTOMY;  Surgeon: Harvel Quale, MD;  [facility];  Service: Gastroenterology;;    Current Outpatient Medications  Medication Sig Dispense Refill   ALPRAZolam (XANAX) 1 MG tablet Take 1 mg by mouth daily.     aspirin 81 MG tablet Take 1 tablet (81 mg total) by mouth daily. 30 tablet    atorvastatin (LIPITOR) 10 MG tablet Take 10 mg by mouth every morning.     buPROPion (WELLBUTRIN XL) 150 MG 24 hr tablet Take 450 mg by mouth every morning.     cholecalciferol (VITAMIN D) 1000 UNITS tablet Take 1,000 Units by mouth 2 (two) times daily.     docusate sodium (STOOL SOFTENER) 100 MG capsule Take 100 mg by mouth every morning.     donepezil (ARICEPT) 10 MG tablet Take 10 mg by mouth daily with breakfast.     Ferrous Sulfate (IRON) 325 (65 Fe) MG TABS Take 1 tablet (325 mg total) by mouth 2 (two) times daily. 99991111 tablet 3   folic acid (FOLVITE) 1 MG tablet Take 1 mg by mouth daily.     furosemide (LASIX) 20 MG tablet Take 20 mg by mouth daily as needed for fluid or edema.     gabapentin (NEURONTIN) 300 MG capsule Take 300 mg by mouth 3 (three) times daily.     GNP VITAMIN B-1 100 MG tablet Take 100 mg by mouth daily. Thiamine     losartan (COZAAR) 50 MG tablet Take 50 mg by mouth daily.     magnesium oxide (MAG-OX) 400 MG tablet Take 400 mg by mouth 2 (two) times daily.     metoprolol succinate (TOPROL XL) 25 MG 24 hr tablet Take 1 tablet (25 mg total) by mouth daily. 90 tablet 3   nitroGLYCERIN (NITROSTAT) 0.4 MG SL tablet Place 1 tablet (0.4 mg total) under the tongue every 5 (five) minutes as needed for chest pain. 25 tablet 3   omeprazole (PRILOSEC) 40 MG capsule Take 1 capsule (40 mg total) by mouth 2 (two) times daily. 180 capsule 1   potassium chloride SA (K-DUR) 20 MEQ tablet Take 20 mEq by mouth daily.      QUEtiapine (SEROQUEL) 200 MG tablet Take 200 mg by mouth at bedtime.     sodium polystyrene (KAYEXALATE) 15  GM/60ML suspension Take 60 mLs (15 g total) by mouth as directed. Take 15 g (60 mL) at noon today.  Take 15 g (60 mL) at 4 PM today.  We will call you tomorrow after labs to let you know if you need to take any additional doses. 240 mL 0   tamsulosin (FLOMAX) 0.4 MG CAPS capsule Take 0.4 mg by mouth daily as needed (Urine problems).     traZODone (DESYREL) 100 MG tablet Take 200 mg by mouth at bedtime.     No current facility-administered medications for this visit.    Allergies as of 03/03/2023 - Review Complete 03/03/2023  Allergen Reaction Noted   Horseradish [armoracia rusticana ext (horseradish)] Anaphylaxis 10/16/2016    Family History  Problem Relation Age of Onset   Hypertension Mother    Colon cancer Neg Hx  Social History   Socioeconomic History   Marital status: Married    Spouse name: Not on file   Number of children: Not on file   Years of education: Not on file   Highest education level: Not on file  Occupational History   Not on file  Tobacco Use   Smoking status: Former    Packs/day: 0.50    Years: 20.00    Additional pack years: 0.00    Total pack years: 10.00    Types: Cigarettes    Quit date: 12/27/1971    Years since quitting: 51.2    Passive exposure: Past   Smokeless tobacco: Never  Vaping Use   Vaping Use: Never used  Substance and Sexual Activity   Alcohol use: Yes    Comment: 1 pint of etoh a day ( not recently)   Drug use: No   Sexual activity: Not on file  Other Topics Concern   Not on file  Social History Narrative   Not on file   Social Determinants of Health   Financial Resource Strain: Not on file  Food Insecurity: No Food Insecurity (11/19/2020)   Hunger Vital Sign    Worried About Running Out of Food in the Last Year: Never true    Ran Out of Food in the Last Year: Never true  Transportation Needs: No Transportation Needs (11/19/2020)   PRAPARE - Hydrologist (Medical): No    Lack of  Transportation (Non-Medical): No  Physical Activity: Inactive (11/19/2020)   Exercise Vital Sign    Days of Exercise per Week: 0 days    Minutes of Exercise per Session: 0 min  Stress: No Stress Concern Present (11/19/2020)   [place]    Feeling of Stress : Not at all  Social Connections: Not on file   Review of systems General: negative for malaise, night sweats, fever, chills, weight loss Neck: Negative for lumps, goiter, pain and significant neck swelling Resp: Negative for cough, wheezing, dyspnea at rest CV: Negative for chest pain, leg swelling, palpitations, orthopnea GI: denies melena, hematochezia, vomiting, dysphagia, odyonophagia, early satiety or unintentional weight loss. +occasional nausea +loose stools +constipation  MSK: Negative for joint pain or swelling, back pain, and muscle pain. Derm: Negative for itching or rash Psych: Denies depression, anxiety, memory loss, confusion. No homicidal or suicidal ideation.  Heme: Negative for prolonged bleeding, bruising easily, and swollen nodes. Endocrine: Negative for cold or heat intolerance, polyuria, polydipsia and goiter. Neuro: negative for tremor, gait imbalance, syncope and seizures. The remainder of the review of systems is noncontributory.  Physical Exam: There were no vitals taken for this visit. General:   Alert and oriented. No distress noted. Pleasant and cooperative.  Head:  Normocephalic and atraumatic. Eyes:  Conjuctiva clear without scleral icterus. Mouth:  Oral mucosa pink and moist. Good dentition. No lesions. Heart: Normal rate and rhythm, s1 and s2 heart sounds present.  Lungs: Clear lung sounds in all lobes. Respirations equal and unlabored. Abdomen:  +BS, soft, non-tender and non-distended. No rebound or guarding. No HSM or masses noted. Derm: No palmar erythema or jaundice Msk:  Symmetrical without gross deformities. Normal  posture. Extremities:  Without edema. Neurologic:  Alert and  oriented x4 Psych:  Alert and cooperative. Normal mood and affect.  Invalid input(s): "6 MONTHS"   ASSESSMENT: RAVIS Hansen is a 77 y.o. male presenting today for follow up of GERD/nausea/dysphagia.  GERD/Dysphagia: dysphagia mostly  resolved since EGD. Having minimal to no issues with this. Is on omeprazole 40mg  daily, felt symptoms better controlled on BID dosing, taking mylanta for breakthrough and as a preventative if he eats a trigger food. Will increase back to BID dosing as symptoms were better controlled on this. Should implement good reflux precautions to include avoiding greasy, spicy, fried, citrus foods, and be mindful that caffeine, carbonated drinks, chocolate and alcohol can increase reflux symptoms, Stay upright 2-3 hours after eating, prior to lying down and avoid eating late in the evenings.  Nausea: recent EGD with duodenitis. Nausea very occasional now, thinks this is related to sinus drainage. Appears to be improved since GERD is somewhat better, patient also feels it may be related to sinus drainage as that is when he notices it more. Appetite is good, weight is stable. Will keep an eye on this, he will make me aware if this worsens.   Anemia: last hgb stable at 11.4. follows with hematology, receiving retacrit every other week, no rectal bleeding. Has darker stools on PO iron. No sob, dizziness, fatigue. Should continue to follow with hematology regarding his anemia.   PLAN:  Continue to decrease ETOH intake  2. Continue to folow with hematology  3. Increase omeprazole 40mg  to BID 4. Reflux precautions   All questions were answered, patient verbalized understanding and is in agreement with plan as outlined above.   Follow Up: 6 months   Shelva Hetzer L. Alver Sorrow, MSN, APRN, AGNP-C Adult-Gerontology Nurse Practitioner [facility]  I have reviewed the note and agree with the APP's  assessment as described in this progress note  Maylon Peppers, MD Gastroenterology and Hepatology [facility]

## 2023-03-03 NOTE — Patient Instructions (Addendum)
Continue to decrease alcohol intake  Continue to folow with hematology regarding your anemia   Increase omeprazole 40mg  to twice daily  Avoid greasy, spicy, fried, citrus foods, and be mindful that caffeine, carbonated drinks, chocolate and alcohol can increase reflux symptoms Stay upright 2-3 hours after eating, prior to lying down and avoid eating late in the evenings.  Follow up 6 months  It was a pleasure to see you today. I want to create trusting relationships with patients and provide genuine, compassionate, and quality care. I truly value your feedback! please be on the lookout for a survey regarding your visit with me today. I appreciate your input about our visit and your time in completing this!    Kalanie Fewell L. Alver Sorrow, MSN, APRN, AGNP-C Adult-Gerontology Nurse Practitioner [facility]

## 2023-03-04 NOTE — Addendum Note (Signed)
Addended by: Harvel Quale on: 03/04/2023 07:10 PM   Modules accepted: Level of Service

## 2023-03-11 NOTE — Progress Notes (Unsigned)
[place], [place] [address]   CLINIC:  Medical Oncology/Hematology  PCP:  Curlene Labrum, MD [place] [id number] [phone]   REASON FOR VISIT:  Follow-up for anemia of CKD and iron deficiency   PRIOR THERAPY: None   CURRENT THERAPY: Retacrit injections weekly; intermittent parenteral iron therapy; oral iron supplement  INTERVAL HISTORY:   [name] 77 y.o. male returns for routine follow-up of his normocytic anemia.  He was last seen by Tarri Abernethy PA-C on 03/01/2022.  At today's visit, he reports feeling ***.  No recent hospitalizations, surgeries, or changes in baseline health status.  *** He is tolerating his Retacrit injections fairly well without any worsening blood pressure or symptoms of DVT/PE. *** He takes iron daily, but reports that he has intermittent dark and sometimes "black" bowel movements; no rectal bleeding, hematemesis, or epistaxis. *** He denies any fatigue at this time. *** He continues to have lightheadedness as well as occasional headaches, chronic restless legs, and chronic ice pica. *** He does have intermittent chest pain and palpitations associated with anxiety and PTSD, but this is improved after being started on blood pressure medication by cardiologist. *** He has occasional shortness of breath on exertion, which he reports is "normal for him." *** Overall, he has not noticed any worsening in his symptoms or overall health condition.  *** No fatigue, fever, chills, shortness of breath, cough, chest pain, nausea, vomiting,   *** Alcohol use since last visit?  Alcoholics Anonymous?  He has ***% energy and ***% appetite. He endorses that he is maintaining a stable weight.  ASSESSMENT & PLAN:  1.   Normocytic anemia - His anemia dates back to at least 2014 with a negative peripheral work-up, negative bone marrow aspiration and biopsy and cytogenetics on 10/16/2016 (obtained in the setting  of heavy alcohol use). The bone marrow showed slightly hypercellular marrow with trilineage hematopoiesis. Chromosome analysis was normal. - MGUS/myeloma panel (09/20/2021) was negative.  Hemolysis work-up was negative.   - He has been receiving intermittent Epogen and parenteral iron therapy since June 2020. - He is a Jehovah witness and does not receive blood products. - EGD (11/20/2022): Erythematous gastric mucosa, duodenitis - Colonoscopy (11/20/2022): Single nonbleeding angiodysplastic lesion treated with APC, polyps x 4 (pathology benign), diverticulosis - Currently receiving Retacrit 40,000 units every other week, tolerating this well.  *** - He continues to take iron tablets 325 mg once daily.   *** - He has required intermittent IV iron*** - He reports intermittent black bowel movements.  He denies any bright red blood per rectum, hematemesis, or epistaxis.     *** - Labs today (03/12/2023): *** - DIFFERENTIAL DIAGNOSIS: Anemia related to mild CKD, functional iron deficiency, and anemia of chronic disease.   Occult GI blood loss possible - he does have intermittent drops in his Hgb and presence of nonbleeding AVM in colon on most recent colonoscopy but without associated drops in iron Patient reports that he has been anemic ever since [place] and had significant agent orange exposure, which has been associated with aplastic anemia and other conditions. Would also consider possible early MDS (bone marrow biopsy in 2017 showed subtle dyspoietic changes that were nondiagnostic and nonspecific at that time) Unlikely that history of heavy alcohol use (from 2007-2017) with ongoing intermittent drinking that could still be causing myelosuppression.  (He still drinks occasionally every month or so, but will sometimes drink a few beers and a  fifth whiskey.)*** -- If he continues to have worsening anemia, repeat bone marrow biopsy would be reasonable.  Patient is hesitant to undergo repeat bone marrow  biopsy and would like to avoid this for as long as possible. - PLAN: *** Retacrit 40,000 units every 3 weeks.*** - *** IV iron *** - Repeat labs (CBC, CMP, iron/TIBC, ferritin) and RTC in 3 months*** - We discussed his alcoholism at length, and he was given materials for local AA support group, with verbalized intention to attend***   2.  B12 deficiency - Labs from 10/07/2021 showed normal B12 433, but elevated methylmalonic acid at 470, signifying mild B12 deficiency and despite patient taking taking B12 daily 1,000 mcg - Patient was started on monthly B12 injections in November 2022 - B12 normal at 426 with normal MMA as of 10/10/2022 - PLAN: Continue monthly B12 injections.  We will recheck B12 and methylmalonic acid in 6 months (June 2024)   3.  CKD stage II with intermittent AKI - Previous labs show creatinine up to 2.26 - Most recent creatinine 1.30/GFR 58 (11/18/2022)*** - PLAN: Continue follow-up with PCP for chronic kidney disease, may need referral to nephrology in the future, at PCP discretion.  Avoid NSAIDs and maintain adequate hydration.   4. Thrombocytopenia, mild - RESOLVED   - Mild intermittent thrombocytopenia - Most recent CBC (03/12/2023) showing normal platelets ***   PLAN SUMMARY:*** >> CBC + Retacrit every 2 weeks >> Monthly B12 injections >> Labs in 3 months (CBC/D, CMP, ferritin, iron/TIBC) >> Office visit in 3 months >> *** >> ***    REVIEW OF SYSTEMS: ***  Review of Systems - Oncology   PHYSICAL EXAM:  ECOG PERFORMANCE STATUS: {CHL ONC ECOG [id number] *** There were no vitals filed for this visit. There were no vitals filed for this visit. Physical Exam  PAST MEDICAL/SURGICAL HISTORY:  Past Medical History:  Diagnosis Date   Alcohol abuse 11/24/2016   Anemia 10/11/2013   Cardiomyopathy ([place])    a. EF 50% by echo in 2019 with NST showing no reversible ischemia and thought to be alcohol-induced   Chronic renal disease, stage 3, moderately  decreased glomerular filtration rate (GFR) between 30-59 mL/min/1.73 square meter (HCC) 12/02/2016   Colon polyps    GERD (gastroesophageal reflux disease)    High cholesterol    Hypertension    Sinus infection    Past Surgical History:  Procedure Laterality Date   BIOPSY  06/03/2019   Procedure: BIOPSY;  Surgeon: Rogene Houston, MD;  [facility];  Service: Endoscopy;;  esophagus   BIOPSY N/A 11/20/2022   Procedure: RANDOM BIOPSIES;  Surgeon: Harvel Quale, MD;  [facility];  Service: Gastroenterology;  Laterality: N/A;   COLONOSCOPY N/A 10/26/2013   Procedure: COLONOSCOPY;  Surgeon: Rogene Houston, MD;  [facility];  Service: Endoscopy;  Laterality: N/A;  325-moved to 1230 Ann to notify pt   Colonoscopy with polypectomy     COLONOSCOPY WITH PROPOFOL N/A 06/03/2019   Procedure: COLONOSCOPY WITH PROPOFOL;  Surgeon: Rogene Houston, MD;  [facility];  Service: Endoscopy;  Laterality: N/A;   COLONOSCOPY WITH PROPOFOL N/A 11/20/2022   Procedure: COLONOSCOPY WITH PROPOFOL;  Surgeon: Harvel Quale, MD;  [facility];  Service: Gastroenterology;  Laterality: N/A;  730 ASA 3   ESOPHAGEAL DILATION N/A 11/20/2022   Procedure: ESOPHAGEAL DILATION;  Surgeon: Harvel Quale, MD;  [facility];  Service: Gastroenterology;  Laterality: N/A;  ESOPHAGOGASTRODUODENOSCOPY (EGD) WITH PROPOFOL N/A 06/03/2019   Procedure: ESOPHAGOGASTRODUODENOSCOPY (EGD) WITH PROPOFOL;  Surgeon: Rogene Houston, MD;  [facility];  Service: Endoscopy;  Laterality: N/A;   ESOPHAGOGASTRODUODENOSCOPY (EGD) WITH PROPOFOL N/A 11/20/2022   Procedure: ESOPHAGOGASTRODUODENOSCOPY (EGD) WITH PROPOFOL;  Surgeon: Harvel Quale, MD;  [facility];  Service: Gastroenterology;  Laterality: N/A;   HOT HEMOSTASIS  11/20/2022   Procedure: HOT HEMOSTASIS (ARGON PLASMA COAGULATION/BICAP);  Surgeon: Montez Morita, Quillian Quince, MD;  [facility];  Service: Gastroenterology;;   POLYPECTOMY  06/03/2019   Procedure: POLYPECTOMY;  Surgeon: Rogene Houston, MD;  [facility];  Service: Endoscopy;;  colon   POLYPECTOMY  11/20/2022   Procedure: POLYPECTOMY;  Surgeon: Harvel Quale, MD;  [facility];  Service: Gastroenterology;;    SOCIAL HISTORY:  Social History   Socioeconomic History   Marital status: Married    Spouse name: Not on file   Number of children: Not on file   Years of education: Not on file   Highest education level: Not on file  Occupational History   Not on file  Tobacco Use   Smoking status: Former    Packs/day: 0.50    Years: 20.00    Additional pack years: 0.00    Total pack years: 10.00    Types: Cigarettes    Quit date: 12/27/1971    Years since quitting: 51.2    Passive exposure: Past   Smokeless tobacco: Never  Vaping Use   Vaping Use: Never used  Substance and Sexual Activity   Alcohol use: Yes    Comment: 1 pint of etoh a day ( not recently)   Drug use: No   Sexual activity: Not on file  Other Topics Concern   Not on file  Social History Narrative   Not on file   Social Determinants of Health   Financial Resource Strain: Not on file  Food Insecurity: No Food Insecurity (11/19/2020)   Hunger Vital Sign    Worried About Running Out of Food in the Last Year: Never true    Ran Out of Food in the Last Year: Never true  Transportation Needs: No Transportation Needs (11/19/2020)   PRAPARE - Hydrologist (Medical): No    Lack of Transportation (Non-Medical): No  Physical Activity: Inactive (11/19/2020)   Exercise Vital Sign    Days of Exercise per Week: 0 days    Minutes of Exercise per Session: 0 min  Stress: No Stress Concern Present (11/19/2020)   [place]    Feeling of Stress : Not at all  Social Connections: Not on  file  Intimate Partner Violence: Not At Risk (11/19/2020)   Humiliation, Afraid, Rape, and Kick questionnaire    Fear of Current or Ex-Partner: No    Emotionally Abused: No    Physically Abused: No    Sexually Abused: No    FAMILY HISTORY:  Family History  Problem Relation Age of Onset   Hypertension Mother    Colon cancer Neg Hx     CURRENT MEDICATIONS:  Outpatient Encounter Medications as of 03/12/2023  Medication Sig Note   ALPRAZolam (XANAX) 1 MG tablet Take 1 mg by mouth daily.    aspirin 81 MG tablet Take 1 tablet (81 mg total) by mouth daily.    atorvastatin (LIPITOR) 10 MG tablet Take 10 mg by mouth every morning.    buPROPion [facility]  XL) 150 MG 24 hr tablet Take 450 mg by mouth every morning.    cholecalciferol (VITAMIN D) 1000 UNITS tablet Take 1,000 Units by mouth 2 (two) times daily.    docusate sodium (STOOL SOFTENER) 100 MG capsule Take 100 mg by mouth every morning.    donepezil (ARICEPT) 10 MG tablet Take 10 mg by mouth daily with breakfast.    Ferrous Sulfate (IRON) 325 (65 Fe) MG TABS Take 1 tablet (325 mg total) by mouth 2 (two) times daily. [id number]: One daily   folic acid (FOLVITE) 1 MG tablet Take 1 mg by mouth daily.    furosemide (LASIX) 20 MG tablet Take 20 mg by mouth daily as needed for fluid or edema.    gabapentin (NEURONTIN) 300 MG capsule Take 300 mg by mouth 3 (three) times daily.    GNP VITAMIN B-1 100 MG tablet Take 100 mg by mouth daily. Thiamine    losartan (COZAAR) 50 MG tablet Take 50 mg by mouth daily.    magnesium oxide (MAG-OX) 400 MG tablet Take 400 mg by mouth 2 (two) times daily.    metoprolol succinate (TOPROL XL) 25 MG 24 hr tablet Take 1 tablet (25 mg total) by mouth daily.    nitroGLYCERIN (NITROSTAT) 0.4 MG SL tablet Place 1 tablet (0.4 mg total) under the tongue every 5 (five) minutes as needed for chest pain.    omeprazole (PRILOSEC) 40 MG capsule Take 1 capsule (40 mg total) by mouth 2 (two) times daily.    potassium chloride  SA (K-DUR) 20 MEQ tablet Take 20 mEq by mouth daily.     QUEtiapine (SEROQUEL) 200 MG tablet Take 200 mg by mouth at bedtime.    tamsulosin (FLOMAX) 0.4 MG CAPS capsule Take 0.4 mg by mouth daily as needed (Urine problems).    traZODone (DESYREL) 100 MG tablet Take 200 mg by mouth at bedtime.    No facility-administered encounter medications on file as of 03/12/2023.    ALLERGIES:  Allergies  Allergen Reactions   Horseradish [Armoracia Rusticana Ext (Horseradish)] Anaphylaxis    LABORATORY DATA:  I have reviewed the labs as listed.  CBC    Component Value Date/Time   WBC 10.8 (H) 02/25/2023 0816   RBC 3.95 (L) 02/25/2023 0816   HGB 11.4 (L) 02/25/2023 0816   HCT 35.7 (L) 02/25/2023 0816   HCT 27.9 (L) 08/13/2016 0944   PLT 208 02/25/2023 0816   MCV 90.4 02/25/2023 0816   MCH 28.9 02/25/2023 0816   MCHC 31.9 02/25/2023 0816   RDW 17.6 (H) 02/25/2023 0816   LYMPHSABS 1.6 11/18/2022 1027   MONOABS 0.4 11/18/2022 1027   EOSABS 0.3 11/18/2022 1027   BASOSABS 0.0 11/18/2022 1027      Latest Ref Rng & Units 11/19/2022    8:40 AM 11/18/2022   10:27 AM 10/10/2022    9:58 AM  CMP  Glucose 65 - 99 mg/dL 94  89  95   BUN 7 - 25 mg/dL 25  26  9    Creatinine 0.70 - 1.28 mg/dL 1.26  1.30  1.05   Sodium 135 - 146 mmol/L 138  136  140   Potassium 3.5 - 5.3 mmol/L 5.1  5.5  4.3   Chloride 98 - 110 mmol/L 102  103  107   CO2 20 - 32 mmol/L 26  24  28    Calcium 8.6 - 10.3 mg/dL 9.7  9.2  8.6   Total Protein 6.5 - 8.1 g/dL  7.7  6.6  Total Bilirubin 0.3 - 1.2 mg/dL  0.5  0.8   Alkaline Phos 38 - 126 U/L  64  71   AST 15 - 41 U/L  13  10   ALT 0 - 44 U/L  9  8     DIAGNOSTIC IMAGING:  I have independently reviewed the relevant imaging and discussed with the patient.   WRAP UP:  All questions were answered. The patient knows to call the clinic with any problems, questions or concerns.  Medical decision making: ***  Time spent on visit: I spent *** minutes counseling the patient  face to face. The total time spent in the appointment was *** minutes and more than 50% was on counseling.  Harriett Rush, PA-C  ***

## 2023-03-12 ENCOUNTER — Inpatient Hospital Stay: Payer: Medicare Other

## 2023-03-12 ENCOUNTER — Inpatient Hospital Stay: Payer: Medicare Other | Admitting: Physician Assistant

## 2023-03-12 ENCOUNTER — Other Ambulatory Visit: Payer: Self-pay

## 2023-03-12 ENCOUNTER — Inpatient Hospital Stay (HOSPITAL_BASED_OUTPATIENT_CLINIC_OR_DEPARTMENT_OTHER): Payer: Medicare Other | Admitting: Physician Assistant

## 2023-03-12 VITALS — BP 125/73 | HR 74 | Temp 97.7°F | Resp 18 | Wt 190.0 lb

## 2023-03-12 DIAGNOSIS — N183 Chronic kidney disease, stage 3 unspecified: Secondary | ICD-10-CM

## 2023-03-12 DIAGNOSIS — N1831 Chronic kidney disease, stage 3a: Secondary | ICD-10-CM

## 2023-03-12 DIAGNOSIS — N182 Chronic kidney disease, stage 2 (mild): Secondary | ICD-10-CM | POA: Diagnosis not present

## 2023-03-12 DIAGNOSIS — D649 Anemia, unspecified: Secondary | ICD-10-CM

## 2023-03-12 DIAGNOSIS — D500 Iron deficiency anemia secondary to blood loss (chronic): Secondary | ICD-10-CM

## 2023-03-12 DIAGNOSIS — D631 Anemia in chronic kidney disease: Secondary | ICD-10-CM

## 2023-03-12 DIAGNOSIS — I129 Hypertensive chronic kidney disease with stage 1 through stage 4 chronic kidney disease, or unspecified chronic kidney disease: Secondary | ICD-10-CM | POA: Diagnosis not present

## 2023-03-12 DIAGNOSIS — D696 Thrombocytopenia, unspecified: Secondary | ICD-10-CM

## 2023-03-12 DIAGNOSIS — E538 Deficiency of other specified B group vitamins: Secondary | ICD-10-CM | POA: Diagnosis not present

## 2023-03-12 LAB — IRON AND TIBC
Iron: 54 ug/dL (ref 45–182)
Saturation Ratios: 23 % (ref 17.9–39.5)
TIBC: 237 ug/dL — ABNORMAL LOW (ref 250–450)
UIBC: 183 ug/dL

## 2023-03-12 LAB — CBC WITH DIFFERENTIAL/PLATELET
Abs Immature Granulocytes: 0.01 10*3/uL (ref 0.00–0.07)
Basophils Absolute: 0 10*3/uL (ref 0.0–0.1)
Basophils Relative: 1 %
Eosinophils Absolute: 0.1 10*3/uL (ref 0.0–0.5)
Eosinophils Relative: 2 %
HCT: 31.6 % — ABNORMAL LOW (ref 39.0–52.0)
Hemoglobin: 10.1 g/dL — ABNORMAL LOW (ref 13.0–17.0)
Immature Granulocytes: 0 %
Lymphocytes Relative: 29 %
Lymphs Abs: 1.5 10*3/uL (ref 0.7–4.0)
MCH: 29.4 pg (ref 26.0–34.0)
MCHC: 32 g/dL (ref 30.0–36.0)
MCV: 91.9 fL (ref 80.0–100.0)
Monocytes Absolute: 0.3 10*3/uL (ref 0.1–1.0)
Monocytes Relative: 5 %
Neutro Abs: 3.2 10*3/uL (ref 1.7–7.7)
Neutrophils Relative %: 63 %
Platelets: 176 10*3/uL (ref 150–400)
RBC: 3.44 MIL/uL — ABNORMAL LOW (ref 4.22–5.81)
RDW: 16.8 % — ABNORMAL HIGH (ref 11.5–15.5)
WBC: 5 10*3/uL (ref 4.0–10.5)
nRBC: 0 % (ref 0.0–0.2)

## 2023-03-12 LAB — COMPREHENSIVE METABOLIC PANEL
ALT: 17 U/L (ref 0–44)
AST: 43 U/L — ABNORMAL HIGH (ref 15–41)
Albumin: 3.9 g/dL (ref 3.5–5.0)
Alkaline Phosphatase: 76 U/L (ref 38–126)
Anion gap: 11 (ref 5–15)
BUN: 11 mg/dL (ref 8–23)
CO2: 30 mmol/L (ref 22–32)
Calcium: 9.3 mg/dL (ref 8.9–10.3)
Chloride: 94 mmol/L — ABNORMAL LOW (ref 98–111)
Creatinine, Ser: 1.19 mg/dL (ref 0.61–1.24)
GFR, Estimated: >60 mL/min (ref >60)
Glucose, Bld: 102 mg/dL — ABNORMAL HIGH (ref 70–99)
Potassium: 3.6 mmol/L (ref 3.5–5.1)
Sodium: 135 mmol/L (ref 135–145)
Total Bilirubin: 1.1 mg/dL (ref 0.3–1.2)
Total Protein: 7 g/dL (ref 6.5–8.1)

## 2023-03-12 LAB — FERRITIN: Ferritin: 1013 ng/mL — ABNORMAL HIGH (ref 24–336)

## 2023-03-12 MED ORDER — CYANOCOBALAMIN 1000 MCG/ML IJ SOLN
1000.0000 ug | Freq: Once | INTRAMUSCULAR | Status: AC
  Administered 2023-03-12: 1000 ug via INTRAMUSCULAR
  Filled 2023-03-12: qty 1

## 2023-03-12 MED ORDER — EPOETIN ALFA 40000 UNIT/ML IJ SOLN
40000.0000 [IU] | Freq: Once | INTRAMUSCULAR | Status: AC
  Administered 2023-03-12: 40000 [IU] via SUBCUTANEOUS
  Filled 2023-03-12: qty 1

## 2023-03-12 NOTE — Patient Instructions (Signed)
[facility] [place]  Discharge Instructions: Thank you for choosing [facility] to provide your oncology and hematology care.  If you have a lab appointment with the Cancer Center, please come in thru the Main Entrance and check in at the main information desk.  Wear comfortable clothing and clothing appropriate for easy access to any Portacath or PICC line.   We strive to give you quality time with your provider. You may need to reschedule your appointment if you arrive late (15 or more minutes).  Arriving late affects you and other patients whose appointments are after yours.  Also, if you miss three or more appointments without notifying the office, you may be dismissed from the clinic at the provider's discretion.      For prescription refill requests, have your pharmacy contact our office and allow 72 hours for refills to be completed.    To help prevent nausea and vomiting after your treatment, we encourage you to take your nausea medication as directed.  BELOW ARE SYMPTOMS THAT SHOULD BE REPORTED IMMEDIATELY: *FEVER GREATER THAN 100.4 F (38 C) OR HIGHER *CHILLS OR SWEATING *NAUSEA AND VOMITING THAT IS NOT CONTROLLED WITH YOUR NAUSEA MEDICATION *UNUSUAL SHORTNESS OF BREATH *UNUSUAL BRUISING OR BLEEDING *URINARY PROBLEMS (pain or burning when urinating, or frequent urination) *BOWEL PROBLEMS (unusual diarrhea, constipation, pain near the anus) TENDERNESS IN MOUTH AND THROAT WITH OR WITHOUT PRESENCE OF ULCERS (sore throat, sores in mouth, or a toothache) UNUSUAL RASH, SWELLING OR PAIN  UNUSUAL VAGINAL DISCHARGE OR ITCHING   Items with * indicate a potential emergency and should be followed up as soon as possible or go to the Emergency Department if any problems should occur.  Please show the CHEMOTHERAPY ALERT CARD or IMMUNOTHERAPY ALERT CARD at check-in to the Emergency Department and triage nurse.  Should you have questions after your visit or need to  cancel or reschedule your appointment, please contact [facility] [phone]  and follow the prompts.  Office hours are 8:00 a.m. to 4:30 p.m. Monday - Friday. Please note that voicemails left after 4:00 p.m. may not be returned until the following business day.  We are closed weekends and major holidays. You have access to a nurse at all times for urgent questions. Please call the main number to the clinic [phone] and follow the prompts.  For any non-urgent questions, you may also contact your provider using MyChart. We now offer e-Visits for anyone 18 and older to request care online for non-urgent symptoms. For details visit [email or web address].   Also download the MyChart app! Go to the app store, search "MyChart", open the app, select [place], and log in with your MyChart username and password.   

## 2023-03-12 NOTE — Progress Notes (Signed)
Patient tolerated injection with no complaints voiced.  Site clean and dry with no bruising or swelling noted at site.  See MAR for details.  Band aid applied.  Patient stable during and after injection.  Vss with discharge and left in satisfactory condition with no s/s of distress noted.  

## 2023-03-12 NOTE — Patient Instructions (Addendum)
[place] at [facility] Discharge Instructions  You were seen today by Tarri Abernethy PA-C.  ANEMIA: Your blood levels are stable.  Your anemia may be coming from variety of factors:  - Anemia from chronic kidney disease  - Anemia from alcoholism  - Anemia from possible bone marrow disorder  For the time being we will continue blood check + Retacrit injection every 3 weeks.  I would like you to DECREASE your iron tablets that you are taking it every other day instead of daily.  B12 DEFICIENCY: Continue B12 injections once per month.  ALCOHOL ABUSE:  There is an Alcoholics Anonymous group that meets at the following location at noon Monday through Friday.  [facility] (grey building, red shutters)  [place], [place] [address]  FOLLOW-UP APPOINTMENT: Repeat labs and follow-up visit in 3 months.   - - - - - - - - - - - - - - - - - -    Thank you for choosing [place] at [facility] to provide your oncology and hematology care.  To afford each patient quality time with our provider, please arrive at least 15 minutes before your scheduled appointment time.   If you have a lab appointment with the [facility] please come in thru the Main Entrance and check in at the main information desk.  You need to re-schedule your appointment should you arrive 10 or more minutes late.  We strive to give you quality time with our providers, and arriving late affects you and other patients whose appointments are after yours.  Also, if you no show three or more times for appointments you may be dismissed from the clinic at the providers discretion.     Again, thank you for choosing [facility].  Our hope is that these requests will decrease the amount of time that you wait before being seen by our physicians.       _____________________________________________________________  Should you have questions after your visit to  [facility], please contact our office at [phone] and follow the prompts.  Our office hours are 8:00 a.m. and 4:30 p.m. Monday - Friday.  Please note that voicemails left after 4:00 p.m. may not be returned until the following business day.  We are closed weekends and major holidays.  You do have access to a nurse 24-7, just call the main number to the clinic [phone] and do not press any options, hold on the line and a nurse will answer the phone.    For prescription refill requests, have your pharmacy contact our office and allow 72 hours.    Due to Covid, you will need to wear a mask upon entering the hospital. If you do not have a mask, a mask will be given to you at the Main Entrance upon arrival. For doctor visits, patients may have 1 support person age 47 or older with them. For treatment visits, patients can not have anyone with them due to social distancing guidelines and our immunocompromised population.

## 2023-04-01 ENCOUNTER — Other Ambulatory Visit: Payer: Self-pay

## 2023-04-02 ENCOUNTER — Inpatient Hospital Stay: Payer: Medicare Other | Attending: Hematology

## 2023-04-02 ENCOUNTER — Other Ambulatory Visit: Payer: Self-pay | Admitting: Physician Assistant

## 2023-04-02 ENCOUNTER — Inpatient Hospital Stay: Payer: Medicare Other

## 2023-04-02 VITALS — BP 146/71 | HR 61 | Temp 96.7°F | Resp 18 | Wt 193.8 lb

## 2023-04-02 DIAGNOSIS — I129 Hypertensive chronic kidney disease with stage 1 through stage 4 chronic kidney disease, or unspecified chronic kidney disease: Secondary | ICD-10-CM | POA: Diagnosis not present

## 2023-04-02 DIAGNOSIS — N182 Chronic kidney disease, stage 2 (mild): Secondary | ICD-10-CM | POA: Diagnosis not present

## 2023-04-02 DIAGNOSIS — N183 Chronic kidney disease, stage 3 unspecified: Secondary | ICD-10-CM

## 2023-04-02 DIAGNOSIS — D631 Anemia in chronic kidney disease: Secondary | ICD-10-CM | POA: Diagnosis not present

## 2023-04-02 DIAGNOSIS — D500 Iron deficiency anemia secondary to blood loss (chronic): Secondary | ICD-10-CM

## 2023-04-02 DIAGNOSIS — D649 Anemia, unspecified: Secondary | ICD-10-CM

## 2023-04-02 DIAGNOSIS — D696 Thrombocytopenia, unspecified: Secondary | ICD-10-CM

## 2023-04-02 DIAGNOSIS — E538 Deficiency of other specified B group vitamins: Secondary | ICD-10-CM | POA: Insufficient documentation

## 2023-04-02 LAB — CBC
HCT: 31.3 % — ABNORMAL LOW (ref 39.0–52.0)
Hemoglobin: 9.7 g/dL — ABNORMAL LOW (ref 13.0–17.0)
MCH: 29 pg (ref 26.0–34.0)
MCHC: 31 g/dL (ref 30.0–36.0)
MCV: 93.4 fL (ref 80.0–100.0)
Platelets: 176 10*3/uL (ref 150–400)
RBC: 3.35 MIL/uL — ABNORMAL LOW (ref 4.22–5.81)
RDW: 15.9 % — ABNORMAL HIGH (ref 11.5–15.5)
WBC: 4.9 10*3/uL (ref 4.0–10.5)
nRBC: 0 % (ref 0.0–0.2)

## 2023-04-02 MED ORDER — CYANOCOBALAMIN 1000 MCG/ML IJ SOLN
1000.0000 ug | Freq: Once | INTRAMUSCULAR | Status: AC
  Administered 2023-04-02: 1000 ug via INTRAMUSCULAR
  Filled 2023-04-02: qty 1

## 2023-04-02 MED ORDER — EPOETIN ALFA 40000 UNIT/ML IJ SOLN
40000.0000 [IU] | Freq: Once | INTRAMUSCULAR | Status: AC
  Administered 2023-04-02: 40000 [IU] via SUBCUTANEOUS
  Filled 2023-04-02: qty 1

## 2023-04-02 NOTE — Patient Instructions (Signed)
[facility]  Discharge Instructions: Thank you for choosing [facility] to provide your oncology and hematology care.  If you have a lab appointment with the Cancer Center - please note that after April 8th, 2024, all labs will be drawn in the cancer center.  You do not have to check in or register with the main entrance as you have in the past but will complete your check-in in the cancer center.  Wear comfortable clothing and clothing appropriate for easy access to any Portacath or PICC line.   We strive to give you quality time with your provider. You may need to reschedule your appointment if you arrive late (15 or more minutes).  Arriving late affects you and other patients whose appointments are after yours.  Also, if you miss three or more appointments without notifying the office, you may be dismissed from the clinic at the provider's discretion.      For prescription refill requests, have your pharmacy contact our office and allow 72 hours for refills to be completed.    Today you received the following Retacrit and B12, return as scheduled.   To help prevent nausea and vomiting after your treatment, we encourage you to take your nausea medication as directed.  BELOW ARE SYMPTOMS THAT SHOULD BE REPORTED IMMEDIATELY: *FEVER GREATER THAN 100.4 F (38 C) OR HIGHER *CHILLS OR SWEATING *NAUSEA AND VOMITING THAT IS NOT CONTROLLED WITH YOUR NAUSEA MEDICATION *UNUSUAL SHORTNESS OF BREATH *UNUSUAL BRUISING OR BLEEDING *URINARY PROBLEMS (pain or burning when urinating, or frequent urination) *BOWEL PROBLEMS (unusual diarrhea, constipation, pain near the anus) TENDERNESS IN MOUTH AND THROAT WITH OR WITHOUT PRESENCE OF ULCERS (sore throat, sores in mouth, or a toothache) UNUSUAL RASH, SWELLING OR PAIN  UNUSUAL VAGINAL DISCHARGE OR ITCHING   Items with * indicate a potential emergency and should be followed up as soon as possible or go to the Emergency  Department if any problems should occur.  Please show the CHEMOTHERAPY ALERT CARD or IMMUNOTHERAPY ALERT CARD at check-in to the Emergency Department and triage nurse.  Should you have questions after your visit or need to cancel or reschedule your appointment, please contact [facility] [phone]  and follow the prompts.  Office hours are 8:00 a.m. to 4:30 p.m. Monday - Friday. Please note that voicemails left after 4:00 p.m. may not be returned until the following business day.  We are closed weekends and major holidays. You have access to a nurse at all times for urgent questions. Please call the main number to the clinic [phone] and follow the prompts.  For any non-urgent questions, you may also contact your provider using MyChart. We now offer e-Visits for anyone 38 and older to request care online for non-urgent symptoms. For details visit [email or web address].   Also download the MyChart app! Go to the app store, search "MyChart", open the app, select [place], and log in with your MyChart username and password.

## 2023-04-02 NOTE — Progress Notes (Signed)
Patient presents today for retacrit injection. Per Rojelio Brenner, PA patient is okay to get both Vitamin B12 and retacrit injections Q3wks.  Hemoglobin reviewed prior to administration. VSS tolerated injections without incident or complaint. See MAR for details. Patient stable during and after injection. Patient discharged in satisfactory condition with no s/s of distress noted.

## 2023-04-13 ENCOUNTER — Ambulatory Visit: Payer: Medicare Other

## 2023-04-23 ENCOUNTER — Inpatient Hospital Stay: Payer: Medicare Other

## 2023-04-23 ENCOUNTER — Inpatient Hospital Stay: Payer: Medicare Other | Attending: Hematology

## 2023-04-23 DIAGNOSIS — E538 Deficiency of other specified B group vitamins: Secondary | ICD-10-CM | POA: Insufficient documentation

## 2023-04-23 DIAGNOSIS — I129 Hypertensive chronic kidney disease with stage 1 through stage 4 chronic kidney disease, or unspecified chronic kidney disease: Secondary | ICD-10-CM | POA: Insufficient documentation

## 2023-04-23 DIAGNOSIS — N183 Chronic kidney disease, stage 3 unspecified: Secondary | ICD-10-CM | POA: Insufficient documentation

## 2023-04-23 DIAGNOSIS — D631 Anemia in chronic kidney disease: Secondary | ICD-10-CM | POA: Insufficient documentation

## 2023-05-13 ENCOUNTER — Ambulatory Visit: Payer: Medicare Other

## 2023-05-14 ENCOUNTER — Inpatient Hospital Stay: Payer: Medicare Other

## 2023-05-14 VITALS — BP 128/59 | HR 64 | Temp 98.1°F | Resp 18 | Wt 190.8 lb

## 2023-05-14 DIAGNOSIS — N183 Chronic kidney disease, stage 3 unspecified: Secondary | ICD-10-CM | POA: Diagnosis not present

## 2023-05-14 DIAGNOSIS — I129 Hypertensive chronic kidney disease with stage 1 through stage 4 chronic kidney disease, or unspecified chronic kidney disease: Secondary | ICD-10-CM | POA: Diagnosis not present

## 2023-05-14 DIAGNOSIS — D631 Anemia in chronic kidney disease: Secondary | ICD-10-CM | POA: Diagnosis not present

## 2023-05-14 DIAGNOSIS — D500 Iron deficiency anemia secondary to blood loss (chronic): Secondary | ICD-10-CM

## 2023-05-14 DIAGNOSIS — E538 Deficiency of other specified B group vitamins: Secondary | ICD-10-CM | POA: Diagnosis not present

## 2023-05-14 DIAGNOSIS — D696 Thrombocytopenia, unspecified: Secondary | ICD-10-CM

## 2023-05-14 DIAGNOSIS — D649 Anemia, unspecified: Secondary | ICD-10-CM

## 2023-05-14 LAB — CBC
HCT: 32 % — ABNORMAL LOW (ref 39.0–52.0)
Hemoglobin: 10 g/dL — ABNORMAL LOW (ref 13.0–17.0)
MCH: 28.7 pg (ref 26.0–34.0)
MCHC: 31.3 g/dL (ref 30.0–36.0)
MCV: 92 fL (ref 80.0–100.0)
Platelets: 259 10*3/uL (ref 150–400)
RBC: 3.48 MIL/uL — ABNORMAL LOW (ref 4.22–5.81)
RDW: 16.3 % — ABNORMAL HIGH (ref 11.5–15.5)
WBC: 6.1 10*3/uL (ref 4.0–10.5)
nRBC: 0 % (ref 0.0–0.2)

## 2023-05-14 MED ORDER — CYANOCOBALAMIN 1000 MCG/ML IJ SOLN
1000.0000 ug | Freq: Once | INTRAMUSCULAR | Status: AC
  Administered 2023-05-14: 1000 ug via INTRAMUSCULAR
  Filled 2023-05-14: qty 1

## 2023-05-14 MED ORDER — EPOETIN ALFA 40000 UNIT/ML IJ SOLN
40000.0000 [IU] | Freq: Once | INTRAMUSCULAR | Status: AC
  Administered 2023-05-14: 40000 [IU] via SUBCUTANEOUS
  Filled 2023-05-14: qty 1

## 2023-05-14 NOTE — Patient Instructions (Signed)
[facility] [place]  Discharge Instructions: Thank you for choosing [facility] to provide your oncology and hematology care.  If you have a lab appointment with the Cancer Center - please note that after April 8th, 2024, all labs will be drawn in the cancer center.  You do not have to check in or register with the main entrance as you have in the past but will complete your check-in in the cancer center.  Wear comfortable clothing and clothing appropriate for easy access to any Portacath or PICC line.   We strive to give you quality time with your provider. You may need to reschedule your appointment if you arrive late (15 or more minutes).  Arriving late affects you and other patients whose appointments are after yours.  Also, if you miss three or more appointments without notifying the office, you may be dismissed from the clinic at the provider's discretion.      For prescription refill requests, have your pharmacy contact our office and allow 72 hours for refills to be completed.    Today you received the following Retacrit and B12 injection, return as scheduled.    To help prevent nausea and vomiting after your treatment, we encourage you to take your nausea medication as directed.  BELOW ARE SYMPTOMS THAT SHOULD BE REPORTED IMMEDIATELY: *FEVER GREATER THAN 100.4 F (38 C) OR HIGHER *CHILLS OR SWEATING *NAUSEA AND VOMITING THAT IS NOT CONTROLLED WITH YOUR NAUSEA MEDICATION *UNUSUAL SHORTNESS OF BREATH *UNUSUAL BRUISING OR BLEEDING *URINARY PROBLEMS (pain or burning when urinating, or frequent urination) *BOWEL PROBLEMS (unusual diarrhea, constipation, pain near the anus) TENDERNESS IN MOUTH AND THROAT WITH OR WITHOUT PRESENCE OF ULCERS (sore throat, sores in mouth, or a toothache) UNUSUAL RASH, SWELLING OR PAIN  UNUSUAL VAGINAL DISCHARGE OR ITCHING   Items with * indicate a potential emergency and should be followed up as soon as possible or go to the  Emergency Department if any problems should occur.  Please show the CHEMOTHERAPY ALERT CARD or IMMUNOTHERAPY ALERT CARD at check-in to the Emergency Department and triage nurse.  Should you have questions after your visit or need to cancel or reschedule your appointment, please contact [facility] [place] [phone]  and follow the prompts.  Office hours are 8:00 a.m. to 4:30 p.m. Monday - Friday. Please note that voicemails left after 4:00 p.m. may not be returned until the following business day.  We are closed weekends and major holidays. You have access to a nurse at all times for urgent questions. Please call the main number to the clinic [phone] and follow the prompts.  For any non-urgent questions, you may also contact your provider using MyChart. We now offer e-Visits for anyone 18 and older to request care online for non-urgent symptoms. For details visit [email or web address].   Also download the MyChart app! Go to the app store, search "MyChart", open the app, select [facility], and log in with your MyChart username and password.   

## 2023-05-14 NOTE — Progress Notes (Signed)
Patient presents today for Retacrit and B12 injections. Hemoglobin reviewed prior to administration. VSS tolerated without incident or complaint. See MAR for details. Patient stable during and after injection. Patient discharged in satisfactory condition with no s/s of distress noted.  

## 2023-05-28 ENCOUNTER — Inpatient Hospital Stay: Payer: Medicare Other | Attending: Hematology

## 2023-06-04 ENCOUNTER — Inpatient Hospital Stay: Payer: Medicare Other

## 2023-06-04 ENCOUNTER — Inpatient Hospital Stay: Payer: Medicare Other | Admitting: Physician Assistant

## 2023-06-09 ENCOUNTER — Other Ambulatory Visit: Payer: Self-pay

## 2023-06-09 DIAGNOSIS — I712 Thoracic aortic aneurysm, without rupture, unspecified: Secondary | ICD-10-CM

## 2023-06-12 ENCOUNTER — Ambulatory Visit: Payer: Medicare Other

## 2023-06-15 ENCOUNTER — Inpatient Hospital Stay: Payer: Medicare Other | Admitting: Physician Assistant

## 2023-06-16 ENCOUNTER — Inpatient Hospital Stay: Payer: Medicare Other | Attending: Hematology

## 2023-06-16 DIAGNOSIS — D500 Iron deficiency anemia secondary to blood loss (chronic): Secondary | ICD-10-CM

## 2023-06-16 DIAGNOSIS — N183 Chronic kidney disease, stage 3 unspecified: Secondary | ICD-10-CM

## 2023-06-16 DIAGNOSIS — N182 Chronic kidney disease, stage 2 (mild): Secondary | ICD-10-CM | POA: Insufficient documentation

## 2023-06-16 DIAGNOSIS — E538 Deficiency of other specified B group vitamins: Secondary | ICD-10-CM | POA: Insufficient documentation

## 2023-06-16 DIAGNOSIS — D631 Anemia in chronic kidney disease: Secondary | ICD-10-CM | POA: Insufficient documentation

## 2023-06-16 DIAGNOSIS — D649 Anemia, unspecified: Secondary | ICD-10-CM

## 2023-06-16 LAB — FERRITIN: Ferritin: 654 ng/mL — ABNORMAL HIGH (ref 24–336)

## 2023-06-16 LAB — CBC WITH DIFFERENTIAL/PLATELET
Abs Immature Granulocytes: 0.01 10*3/uL (ref 0.00–0.07)
Basophils Absolute: 0 10*3/uL (ref 0.0–0.1)
Basophils Relative: 0 %
Eosinophils Absolute: 0.1 10*3/uL (ref 0.0–0.5)
Eosinophils Relative: 2 %
HCT: 29.4 % — ABNORMAL LOW (ref 39.0–52.0)
Hemoglobin: 9.4 g/dL — ABNORMAL LOW (ref 13.0–17.0)
Immature Granulocytes: 0 %
Lymphocytes Relative: 36 %
Lymphs Abs: 1.6 10*3/uL (ref 0.7–4.0)
MCH: 29.3 pg (ref 26.0–34.0)
MCHC: 32 g/dL (ref 30.0–36.0)
MCV: 91.6 fL (ref 80.0–100.0)
Monocytes Absolute: 0.7 10*3/uL (ref 0.1–1.0)
Monocytes Relative: 16 %
Neutro Abs: 2 10*3/uL (ref 1.7–7.7)
Neutrophils Relative %: 46 %
Platelets: 256 10*3/uL (ref 150–400)
RBC: 3.21 MIL/uL — ABNORMAL LOW (ref 4.22–5.81)
RDW: 18 % — ABNORMAL HIGH (ref 11.5–15.5)
WBC: 4.4 10*3/uL (ref 4.0–10.5)
nRBC: 0 % (ref 0.0–0.2)

## 2023-06-16 LAB — COMPREHENSIVE METABOLIC PANEL
ALT: 16 U/L (ref 0–44)
AST: 21 U/L (ref 15–41)
Albumin: 3.7 g/dL (ref 3.5–5.0)
Alkaline Phosphatase: 63 U/L (ref 38–126)
Anion gap: 8 (ref 5–15)
BUN: 12 mg/dL (ref 8–23)
CO2: 27 mmol/L (ref 22–32)
Calcium: 9.1 mg/dL (ref 8.9–10.3)
Chloride: 102 mmol/L (ref 98–111)
Creatinine, Ser: 1.15 mg/dL (ref 0.61–1.24)
GFR, Estimated: >60 mL/min (ref >60)
Glucose, Bld: 107 mg/dL — ABNORMAL HIGH (ref 70–99)
Potassium: 3.8 mmol/L (ref 3.5–5.1)
Sodium: 137 mmol/L (ref 135–145)
Total Bilirubin: 1.5 mg/dL — ABNORMAL HIGH (ref 0.3–1.2)
Total Protein: 6.7 g/dL (ref 6.5–8.1)

## 2023-06-16 LAB — IRON AND TIBC
Iron: 101 ug/dL (ref 45–182)
Saturation Ratios: 47 % — ABNORMAL HIGH (ref 17.9–39.5)
TIBC: 216 ug/dL — ABNORMAL LOW (ref 250–450)
UIBC: 115 ug/dL

## 2023-06-16 LAB — VITAMIN B12: Vitamin B-12: 355 pg/mL (ref 180–914)

## 2023-06-18 LAB — METHYLMALONIC ACID, SERUM: Methylmalonic Acid, Quantitative: 251 nmol/L (ref 0–378)

## 2023-06-23 ENCOUNTER — Inpatient Hospital Stay: Payer: Medicare Other

## 2023-06-23 ENCOUNTER — Inpatient Hospital Stay (HOSPITAL_BASED_OUTPATIENT_CLINIC_OR_DEPARTMENT_OTHER): Payer: Medicare Other | Admitting: Oncology

## 2023-06-23 VITALS — BP 111/53 | HR 75 | Temp 98.8°F | Resp 18 | Wt 187.6 lb

## 2023-06-23 DIAGNOSIS — N1831 Chronic kidney disease, stage 3a: Secondary | ICD-10-CM | POA: Diagnosis not present

## 2023-06-23 DIAGNOSIS — N182 Chronic kidney disease, stage 2 (mild): Secondary | ICD-10-CM | POA: Diagnosis not present

## 2023-06-23 DIAGNOSIS — N183 Chronic kidney disease, stage 3 unspecified: Secondary | ICD-10-CM

## 2023-06-23 DIAGNOSIS — D649 Anemia, unspecified: Secondary | ICD-10-CM

## 2023-06-23 DIAGNOSIS — D631 Anemia in chronic kidney disease: Secondary | ICD-10-CM | POA: Diagnosis not present

## 2023-06-23 DIAGNOSIS — E538 Deficiency of other specified B group vitamins: Secondary | ICD-10-CM | POA: Diagnosis not present

## 2023-06-23 MED ORDER — EPOETIN ALFA 40000 UNIT/ML IJ SOLN
40000.0000 [IU] | Freq: Once | INTRAMUSCULAR | Status: AC
  Administered 2023-06-23: 40000 [IU] via SUBCUTANEOUS
  Filled 2023-06-23: qty 1

## 2023-06-23 MED ORDER — CYANOCOBALAMIN 1000 MCG/ML IJ SOLN
1000.0000 ug | Freq: Once | INTRAMUSCULAR | Status: AC
  Administered 2023-06-23: 1000 ug via INTRAMUSCULAR
  Filled 2023-06-23: qty 1

## 2023-06-23 NOTE — Patient Instructions (Signed)
[facility]  Discharge Instructions: Thank you for choosing [facility] to provide your oncology and hematology care.  If you have a lab appointment with the Cancer Center - please note that after April 8th, 2024, all labs will be drawn in the cancer center.  You do not have to check in or register with the main entrance as you have in the past but will complete your check-in in the cancer center.  Wear comfortable clothing and clothing appropriate for easy access to any Portacath or PICC line.   We strive to give you quality time with your provider. You may need to reschedule your appointment if you arrive late (15 or more minutes).  Arriving late affects you and other patients whose appointments are after yours.  Also, if you miss three or more appointments without notifying the office, you may be dismissed from the clinic at the provider's discretion.      For prescription refill requests, have your pharmacy contact our office and allow 72 hours for refills to be completed.    Today you received the following B12 and Retacrit injection, return as scheduled.   To help prevent nausea and vomiting after your treatment, we encourage you to take your nausea medication as directed.  BELOW ARE SYMPTOMS THAT SHOULD BE REPORTED IMMEDIATELY: *FEVER GREATER THAN 100.4 F (38 C) OR HIGHER *CHILLS OR SWEATING *NAUSEA AND VOMITING THAT IS NOT CONTROLLED WITH YOUR NAUSEA MEDICATION *UNUSUAL SHORTNESS OF BREATH *UNUSUAL BRUISING OR BLEEDING *URINARY PROBLEMS (pain or burning when urinating, or frequent urination) *BOWEL PROBLEMS (unusual diarrhea, constipation, pain near the anus) TENDERNESS IN MOUTH AND THROAT WITH OR WITHOUT PRESENCE OF ULCERS (sore throat, sores in mouth, or a toothache) UNUSUAL RASH, SWELLING OR PAIN  UNUSUAL VAGINAL DISCHARGE OR ITCHING   Items with * indicate a potential emergency and should be followed up as soon as possible or go to the  Emergency Department if any problems should occur.  Please show the CHEMOTHERAPY ALERT CARD or IMMUNOTHERAPY ALERT CARD at check-in to the Emergency Department and triage nurse.  Should you have questions after your visit or need to cancel or reschedule your appointment, please contact [facility] [phone]  and follow the prompts.  Office hours are 8:00 a.m. to 4:30 p.m. Monday - Friday. Please note that voicemails left after 4:00 p.m. may not be returned until the following business day.  We are closed weekends and major holidays. You have access to a nurse at all times for urgent questions. Please call the main number to the clinic [phone] and follow the prompts.  For any non-urgent questions, you may also contact your provider using MyChart. We now offer e-Visits for anyone 16 and older to request care online for non-urgent symptoms. For details visit [email or web address].   Also download the MyChart app! Go to the app store, search "MyChart", open the app, select [facility], and log in with your MyChart username and password.

## 2023-06-23 NOTE — Progress Notes (Signed)
[facility] [address], [place] [address]   CLINIC:  Medical Oncology/Hematology  PCP:  Juliette Alcide, MD [address] [place] [address] [phone]   REASON FOR VISIT:  Follow-up for anemia of CKD and iron deficiency   PRIOR THERAPY: None   CURRENT THERAPY: Retacrit injections weekly; intermittent parenteral iron therapy; oral iron supplement  INTERVAL HISTORY:   Roger Hansen 77 y.o. male returns for routine follow-up of his normocytic anemia.  This he was last seen in clinic by Rojelio Brenner on 03/12/2023.  He denies any interval hospitalizations.  Tolerating Retacrit well.   He last received 40,000 units Retacrit on 05/14/2023 for hemoglobin 10.0.  Has been doing well since his last visit.  He unfortunately missed a visit at the end of June so he is a few weeks late for his Retacrit.  Has intermittent constipation due to his iron supplements.  He is taking iron twice daily.  Has occasional PTSD flares.  He denies any bleeding.  Has occasional shortness of breath especially with exertion.  Appetite is 75% and energy levels are 75%.  He has pain in his lower back which is chronic and has improved since he started gabapentin.  ASSESSMENT & PLAN:  1.   Normocytic anemia - His anemia dates back to at least 2014 with a negative peripheral work-up, negative bone marrow aspiration and biopsy and cytogenetics on 10/16/2016 (obtained in the setting of heavy alcohol use). The bone marrow showed slightly hypercellular marrow with trilineage hematopoiesis. Chromosome analysis was normal. - MGUS/myeloma panel (09/20/2021) was negative.  Hemolysis work-up was negative.   - He has been receiving intermittent Epogen and parenteral iron therapy since June 2020. - He is a Jehovah witness and does not receive blood products. - EGD (11/20/2022): Erythematous gastric mucosa, duodenitis - Colonoscopy (11/20/2022): Single nonbleeding angiodysplastic lesion treated with APC,  polyps x 4 (pathology benign), diverticulosis - Currently receiving Retacrit 40,000 units every other week, tolerating this well.   - He continues to take iron tablets 325 mg once daily.    - He has required intermittent IV iron - He reports intermittent black bowel movements.  He denies any bright red blood per rectum, hematemesis, or epistaxis.     - Labs today (03/12/2023): Hgb 10.1/MCV 91.9, creatinine 1.19, ferritin 1013, iron saturation 23% with low TIBC 237 - DIFFERENTIAL DIAGNOSIS: Anemia related to mild CKD, functional iron deficiency, and anemia of chronic disease.   Occult GI blood loss possible - he does have intermittent drops in his Hgb and presence of nonbleeding AVM in colon on most recent colonoscopy but without associated drops in iron Patient reports that he has been anemic ever since [place] and had significant agent orange exposure, which has been associated with aplastic anemia and other conditions. Would also consider possible early MDS (bone marrow biopsy in 2017 showed subtle dyspoietic changes that were nondiagnostic and nonspecific at that time) Myelosuppression from alcohol consumption.  Prior history of heavy alcohol use (from 2007-2017), but has lately been drinking about a fifth of liquor each week. -- If he continues to have worsening anemia, repeat bone marrow biopsy would be reasonable.  Patient is hesitant to undergo repeat bone marrow biopsy and would like to avoid this for as long as possible. - PLAN:  -Continue Retacrit 40,000 every 3 weeks. -Labs from 06/16/23 show hemoglobin 9.4, ferritin 654 and iron saturation of 47%. -He does not need iron at this time. -Decrease oral iron to  1 tablet every other day. - Repeat labs (CBC only) every 3 weeks prior to Retacrit and in 3 months (CBC, CMP, iron/TIBC, ferritin) prior to seeing a provider.   2.  B12 deficiency - Labs from 10/07/2021 showed normal B12 433, but elevated methylmalonic acid at 470, signifying mild B12  deficiency and despite patient taking taking B12 daily 1,000 mcg - Patient was started on monthly B12 injections in November 2022 - B12 normal at 426 with normal MMA as of 10/10/2022 -B12 level on 06/16/2023 was 355 and MMA is 251. - PLAN: Continue monthly B12 injections.  We will recheck B12 and methylmalonic acid in 6 months (December 2024)   3.  CKD stage II with intermittent AKI - Most recent creatinine was 1.15 previously 1.19. - PLAN: Continue follow-up with PCP for chronic kidney disease, may need referral to nephrology in the future, at PCP discretion.  Avoid NSAIDs and maintain adequate hydration.   4. Thrombocytopenia, mild - RESOLVED   - Mild intermittent thrombocytopenia - Most recent CBC shows normal platelets.   PLAN SUMMARY: >> CBC + Retacrit every 3 weeks >> Monthly B12 injections >> Labs in 3 months (CBC/D, CMP, ferritin, iron/TIBC, B12, MMA) >> Office visit in 3 months (at least 1 week after labs)     REVIEW OF SYSTEMS:   Review of Systems  Constitutional:  Positive for fatigue.  Cardiovascular:  Positive for chest pain.  Gastrointestinal:  Positive for constipation and diarrhea.  Musculoskeletal:  Positive for back pain.  Neurological:  Positive for dizziness and headaches.  Psychiatric/Behavioral:  Positive for depression. The patient is nervous/anxious.      PHYSICAL EXAM:  ECOG PERFORMANCE STATUS: 1 - Symptomatic but completely ambulatory  Vitals:   06/23/23 1351  BP: (!) 111/53  Pulse: 75  Resp: 18  Temp: 98.8 F (37.1 C)  SpO2: 99%   Filed Weights   06/23/23 1351  Weight: 187 lb 9.8 oz (85.1 kg)   Physical Exam Constitutional:      Appearance: Normal appearance.  Cardiovascular:     Rate and Rhythm: Normal rate and regular rhythm.  Pulmonary:     Effort: Pulmonary effort is normal.     Breath sounds: Normal breath sounds.  Abdominal:     General: Bowel sounds are normal.     Palpations: Abdomen is soft.  Musculoskeletal:         General: No swelling. Normal range of motion.  Neurological:     Mental Status: He is alert and oriented to person, place, and time. Mental status is at baseline.     PAST MEDICAL/SURGICAL HISTORY:  Past Medical History:  Diagnosis Date   Alcohol abuse 11/24/2016   Anemia 10/11/2013   Cardiomyopathy (HCC)    a. EF 50% by echo in 2019 with NST showing no reversible ischemia and thought to be alcohol-induced   Chronic renal disease, stage 3, moderately decreased glomerular filtration rate (GFR) between 30-59 mL/min/1.73 square meter (HCC) 12/02/2016   Colon polyps    GERD (gastroesophageal reflux disease)    High cholesterol    Hypertension    Sinus infection    Past Surgical History:  Procedure Laterality Date   BIOPSY  06/03/2019   Procedure: BIOPSY;  Surgeon: Malissa Hippo, MD;  [facility];  Service: Endoscopy;;  esophagus   BIOPSY N/A 11/20/2022   Procedure: RANDOM BIOPSIES;  Surgeon: Dolores Frame, MD;  [facility];  Service: Gastroenterology;  Laterality: N/A;   COLONOSCOPY N/A 10/26/2013  Procedure: COLONOSCOPY;  Surgeon: Malissa Hippo, MD;  [facility];  Service: Endoscopy;  Laterality: N/A;  325-moved to 1230 Ann to notify pt   Colonoscopy with polypectomy     COLONOSCOPY WITH PROPOFOL N/A 06/03/2019   Procedure: COLONOSCOPY WITH PROPOFOL;  Surgeon: Malissa Hippo, MD;  [facility];  Service: Endoscopy;  Laterality: N/A;   COLONOSCOPY WITH PROPOFOL N/A 11/20/2022   Procedure: COLONOSCOPY WITH PROPOFOL;  Surgeon: Dolores Frame, MD;  [facility];  Service: Gastroenterology;  Laterality: N/A;  730 ASA 3   ESOPHAGEAL DILATION N/A 11/20/2022   Procedure: ESOPHAGEAL DILATION;  Surgeon: Dolores Frame, MD;  [facility];  Service: Gastroenterology;  Laterality: N/A;   ESOPHAGOGASTRODUODENOSCOPY (EGD) WITH PROPOFOL N/A 06/03/2019   Procedure: ESOPHAGOGASTRODUODENOSCOPY (EGD)  WITH PROPOFOL;  Surgeon: Malissa Hippo, MD;  [facility];  Service: Endoscopy;  Laterality: N/A;   ESOPHAGOGASTRODUODENOSCOPY (EGD) WITH PROPOFOL N/A 11/20/2022   Procedure: ESOPHAGOGASTRODUODENOSCOPY (EGD) WITH PROPOFOL;  Surgeon: Dolores Frame, MD;  [facility];  Service: Gastroenterology;  Laterality: N/A;   HOT HEMOSTASIS  11/20/2022   Procedure: HOT HEMOSTASIS (ARGON PLASMA COAGULATION/BICAP);  Surgeon: Marguerita Merles, Reuel Boom, MD;  [facility];  Service: Gastroenterology;;   POLYPECTOMY  06/03/2019   Procedure: POLYPECTOMY;  Surgeon: Malissa Hippo, MD;  [facility];  Service: Endoscopy;;  colon   POLYPECTOMY  11/20/2022   Procedure: POLYPECTOMY;  Surgeon: Dolores Frame, MD;  [facility];  Service: Gastroenterology;;    SOCIAL HISTORY:  Social History   Socioeconomic History   Marital status: Married    Spouse name: Not on file   Number of children: Not on file   Years of education: Not on file   Highest education level: Not on file  Occupational History   Not on file  Tobacco Use   Smoking status: Former    Packs/day: 0.50    Years: 20.00    Additional pack years: 0.00    Total pack years: 10.00    Types: Cigarettes    Quit date: 12/27/1971    Years since quitting: 51.5    Passive exposure: Past   Smokeless tobacco: Never  Vaping Use   Vaping Use: Never used  Substance and Sexual Activity   Alcohol use: Yes    Comment: 1 pint of etoh a day ( not recently)   Drug use: No   Sexual activity: Not on file  Other Topics Concern   Not on file  Social History Narrative   Not on file   Social Determinants of Health   Financial Resource Strain: Not on file  Food Insecurity: No Food Insecurity (11/19/2020)   Hunger Vital Sign    Worried About Running Out of Food in the Last Year: Never true    Ran Out of Food in the Last Year: Never true  Transportation Needs: No Transportation Needs  (11/19/2020)   PRAPARE - Administrator, Civil Service (Medical): No    Lack of Transportation (Non-Medical): No  Physical Activity: Inactive (11/19/2020)   Exercise Vital Sign    Days of Exercise per Week: 0 days    Minutes of Exercise per Session: 0 min  Stress: No Stress Concern Present (11/19/2020)   [employer] of Occupational Health - Occupational Stress Questionnaire    Feeling of Stress : Not at all  Social Connections: Not on file  Intimate Partner Violence: Not At  Risk (11/19/2020)   Humiliation, Afraid, Rape, and Kick questionnaire    Fear of Current or Ex-Partner: No    Emotionally Abused: No    Physically Abused: No    Sexually Abused: No    FAMILY HISTORY:  Family History  Problem Relation Age of Onset   Hypertension Mother    Colon cancer Neg Hx     CURRENT MEDICATIONS:  Outpatient Encounter Medications as of 06/23/2023  Medication Sig Note   ALPRAZolam (XANAX) 1 MG tablet Take 1 mg by mouth daily.    aspirin 81 MG tablet Take 1 tablet (81 mg total) by mouth daily.    atorvastatin (LIPITOR) 10 MG tablet Take 10 mg by mouth every morning.    buPROPion (WELLBUTRIN XL) 150 MG 24 hr tablet Take 450 mg by mouth every morning.    cholecalciferol (VITAMIN D) 1000 UNITS tablet Take 1,000 Units by mouth 2 (two) times daily.    docusate sodium (STOOL SOFTENER) 100 MG capsule Take 100 mg by mouth every morning.    donepezil (ARICEPT) 10 MG tablet Take 10 mg by mouth daily with breakfast.    Ferrous Sulfate (IRON) 325 (65 Fe) MG TABS Take 1 tablet (325 mg total) by mouth 2 (two) times daily. 03/03/2023: One daily   folic acid (FOLVITE) 1 MG tablet Take 1 mg by mouth daily.    furosemide (LASIX) 20 MG tablet Take 20 mg by mouth daily as needed for fluid or edema.    gabapentin (NEURONTIN) 300 MG capsule Take 300 mg by mouth 3 (three) times daily.    GNP VITAMIN B-1 100 MG tablet Take 100 mg by mouth daily. Thiamine    losartan (COZAAR) 50 MG tablet Take 50 mg by  mouth daily.    magnesium oxide (MAG-OX) 400 MG tablet Take 400 mg by mouth 2 (two) times daily.    metoprolol succinate (TOPROL XL) 25 MG 24 hr tablet Take 1 tablet (25 mg total) by mouth daily.    nitroGLYCERIN (NITROSTAT) 0.4 MG SL tablet Place 1 tablet (0.4 mg total) under the tongue every 5 (five) minutes as needed for chest pain.    omeprazole (PRILOSEC) 40 MG capsule Take 1 capsule (40 mg total) by mouth 2 (two) times daily.    potassium chloride SA (K-DUR) 20 MEQ tablet Take 20 mEq by mouth daily.     QUEtiapine (SEROQUEL) 200 MG tablet Take 200 mg by mouth at bedtime.    tamsulosin (FLOMAX) 0.4 MG CAPS capsule Take 0.4 mg by mouth daily as needed (Urine problems).    traZODone (DESYREL) 100 MG tablet Take 200 mg by mouth at bedtime.    No facility-administered encounter medications on file as of 06/23/2023.    ALLERGIES:  Allergies  Allergen Reactions   Horseradish [Armoracia Rusticana Ext (Horseradish)] Anaphylaxis    LABORATORY DATA:  I have reviewed the labs as listed.  CBC    Component Value Date/Time   WBC 4.4 06/16/2023 1006   RBC 3.21 (L) 06/16/2023 1006   HGB 9.4 (L) 06/16/2023 1006   HCT 29.4 (L) 06/16/2023 1006   HCT 27.9 (L) 08/13/2016 0944   PLT 256 06/16/2023 1006   MCV 91.6 06/16/2023 1006   MCH 29.3 06/16/2023 1006   MCHC 32.0 06/16/2023 1006   RDW 18.0 (H) 06/16/2023 1006   LYMPHSABS 1.6 06/16/2023 1006   MONOABS 0.7 06/16/2023 1006   EOSABS 0.1 06/16/2023 1006   BASOSABS 0.0 06/16/2023 1006      Latest Ref Rng &  Units 06/16/2023   10:06 AM 03/12/2023    8:16 AM 11/19/2022    8:40 AM  CMP  Glucose 70 - 99 mg/dL 161  096  94   BUN 8 - 23 mg/dL 12  11  25    Creatinine 0.61 - 1.24 mg/dL 0.45  4.09  8.11   Sodium 135 - 145 mmol/L 137  135  138   Potassium 3.5 - 5.1 mmol/L 3.8  3.6  5.1   Chloride 98 - 111 mmol/L 102  94  102   CO2 22 - 32 mmol/L 27  30  26    Calcium 8.9 - 10.3 mg/dL 9.1  9.3  9.7   Total Protein 6.5 - 8.1 g/dL 6.7  7.0    Total  Bilirubin 0.3 - 1.2 mg/dL 1.5  1.1    Alkaline Phos 38 - 126 U/L 63  76    AST 15 - 41 U/L 21  43    ALT 0 - 44 U/L 16  17      DIAGNOSTIC IMAGING:  I have independently reviewed the relevant imaging and discussed with the patient.   WRAP UP:  All questions were answered. The patient knows to call the clinic with any problems, questions or concerns.  Medical decision making: Moderate  Time spent on visit: I spent 20 minutes dedicated to the care of this patient (face-to-face and non-face-to-face) on the date of the encounter to include what is described in the assessment and plan.  Mauro Kaufmann, NP  03/12/2023 10:20 AM

## 2023-06-23 NOTE — Progress Notes (Signed)
Patient presents today for Retacrit and B12 injections. Hemoglobin reviewed prior to administration. VSS tolerated without incident or complaint. See MAR for details. Patient stable during and after injection. Patient discharged in satisfactory condition with no s/s of distress noted.  

## 2023-07-13 ENCOUNTER — Other Ambulatory Visit: Payer: Self-pay

## 2023-07-13 ENCOUNTER — Ambulatory Visit: Payer: Medicare Other

## 2023-07-14 ENCOUNTER — Inpatient Hospital Stay: Payer: Medicare Other

## 2023-07-14 VITALS — BP 153/70 | HR 63 | Temp 97.3°F | Resp 18 | Wt 186.2 lb

## 2023-07-14 DIAGNOSIS — N182 Chronic kidney disease, stage 2 (mild): Secondary | ICD-10-CM | POA: Diagnosis not present

## 2023-07-14 DIAGNOSIS — N183 Chronic kidney disease, stage 3 unspecified: Secondary | ICD-10-CM

## 2023-07-14 DIAGNOSIS — D500 Iron deficiency anemia secondary to blood loss (chronic): Secondary | ICD-10-CM

## 2023-07-14 DIAGNOSIS — D649 Anemia, unspecified: Secondary | ICD-10-CM

## 2023-07-14 DIAGNOSIS — E538 Deficiency of other specified B group vitamins: Secondary | ICD-10-CM | POA: Diagnosis not present

## 2023-07-14 DIAGNOSIS — D696 Thrombocytopenia, unspecified: Secondary | ICD-10-CM

## 2023-07-14 DIAGNOSIS — D631 Anemia in chronic kidney disease: Secondary | ICD-10-CM | POA: Diagnosis not present

## 2023-07-14 LAB — CBC
HCT: 29.3 % — ABNORMAL LOW (ref 39.0–52.0)
Hemoglobin: 9.2 g/dL — ABNORMAL LOW (ref 13.0–17.0)
MCH: 29.7 pg (ref 26.0–34.0)
MCHC: 31.4 g/dL (ref 30.0–36.0)
MCV: 94.5 fL (ref 80.0–100.0)
Platelets: 223 10*3/uL (ref 150–400)
RBC: 3.1 MIL/uL — ABNORMAL LOW (ref 4.22–5.81)
RDW: 17.9 % — ABNORMAL HIGH (ref 11.5–15.5)
WBC: 4.7 10*3/uL (ref 4.0–10.5)
nRBC: 0 % (ref 0.0–0.2)

## 2023-07-14 MED ORDER — EPOETIN ALFA 40000 UNIT/ML IJ SOLN
40000.0000 [IU] | Freq: Once | INTRAMUSCULAR | Status: AC
  Administered 2023-07-14: 40000 [IU] via SUBCUTANEOUS
  Filled 2023-07-14: qty 1

## 2023-07-14 MED ORDER — CYANOCOBALAMIN 1000 MCG/ML IJ SOLN
1000.0000 ug | Freq: Once | INTRAMUSCULAR | Status: AC
  Administered 2023-07-14: 1000 ug via INTRAMUSCULAR
  Filled 2023-07-14: qty 1

## 2023-07-14 NOTE — Progress Notes (Signed)
B12 and Retacrit injections given per orders. Patient tolerated it well without problems. Vitals stable and discharged home from clinic ambulatory. Follow up as scheduled.

## 2023-07-14 NOTE — Patient Instructions (Signed)
[facility]  Discharge Instructions: Thank you for choosing [facility] to provide your oncology and hematology care.  If you have a lab appointment with the Cancer Center - please note that after April 8th, 2024, all labs will be drawn in the cancer center.  You do not have to check in or register with the main entrance as you have in the past but will complete your check-in in the cancer center.  Wear comfortable clothing and clothing appropriate for easy access to any Portacath or PICC line.   We strive to give you quality time with your provider. You may need to reschedule your appointment if you arrive late (15 or more minutes).  Arriving late affects you and other patients whose appointments are after yours.  Also, if you miss three or more appointments without notifying the office, you may be dismissed from the clinic at the provider's discretion.      For prescription refill requests, have your pharmacy contact our office and allow 72 hours for refills to be completed.    Today you received the following, B12 and retacrit injections      To help prevent nausea and vomiting after your treatment, we encourage you to take your nausea medication as directed.  BELOW ARE SYMPTOMS THAT SHOULD BE REPORTED IMMEDIATELY: *FEVER GREATER THAN 100.4 F (38 C) OR HIGHER *CHILLS OR SWEATING *NAUSEA AND VOMITING THAT IS NOT CONTROLLED WITH YOUR NAUSEA MEDICATION *UNUSUAL SHORTNESS OF BREATH *UNUSUAL BRUISING OR BLEEDING *URINARY PROBLEMS (pain or burning when urinating, or frequent urination) *BOWEL PROBLEMS (unusual diarrhea, constipation, pain near the anus) TENDERNESS IN MOUTH AND THROAT WITH OR WITHOUT PRESENCE OF ULCERS (sore throat, sores in mouth, or a toothache) UNUSUAL RASH, SWELLING OR PAIN  UNUSUAL VAGINAL DISCHARGE OR ITCHING   Items with * indicate a potential emergency and should be followed up as soon as possible or go to the Emergency Department if  any problems should occur.  Please show the CHEMOTHERAPY ALERT CARD or IMMUNOTHERAPY ALERT CARD at check-in to the Emergency Department and triage nurse.  Should you have questions after your visit or need to cancel or reschedule your appointment, please contact [facility] [phone]  and follow the prompts.  Office hours are 8:00 a.m. to 4:30 p.m. Monday - Friday. Please note that voicemails left after 4:00 p.m. may not be returned until the following business day.  We are closed weekends and major holidays. You have access to a nurse at all times for urgent questions. Please call the main number to the clinic [phone] and follow the prompts.  For any non-urgent questions, you may also contact your provider using MyChart. We now offer e-Visits for anyone 64 and older to request care online for non-urgent symptoms. For details visit [email or web address].   Also download the MyChart app! Go to the app store, search "MyChart", open the app, select [place], and log in with your MyChart username and password.

## 2023-07-22 ENCOUNTER — Ambulatory Visit (HOSPITAL_COMMUNITY)
Admission: RE | Admit: 2023-07-22 | Discharge: 2023-07-22 | Disposition: A | Discharge status: Home / Self Care | Payer: Medicare Other | Source: Ambulatory Visit | Attending: Internal Medicine | Admitting: Internal Medicine

## 2023-07-22 DIAGNOSIS — I712 Thoracic aortic aneurysm, without rupture, unspecified: Secondary | ICD-10-CM | POA: Diagnosis not present

## 2023-07-22 LAB — ECHOCARDIOGRAM COMPLETE
Area-P 1/2: 2.71 cm2
Calc EF: 54.2 %
MV M vel: 4.45 m/s
MV Peak grad: 79.2 mmHg
P 1/2 time: 595 msec
S' Lateral: 3.1 cm
Single Plane A2C EF: 52.9 %
Single Plane A4C EF: 54.5 %

## 2023-07-22 NOTE — Progress Notes (Signed)
  Echocardiogram 2D Echocardiogram has been performed.  Maren Reamer 07/22/2023, 11:30 AM

## 2023-07-24 ENCOUNTER — Inpatient Hospital Stay: Payer: Medicare Other | Attending: Hematology

## 2023-07-24 VITALS — BP 130/64 | HR 65 | Temp 97.6°F | Resp 18 | Ht 64.5 in | Wt 183.8 lb

## 2023-07-24 DIAGNOSIS — N182 Chronic kidney disease, stage 2 (mild): Secondary | ICD-10-CM | POA: Insufficient documentation

## 2023-07-24 DIAGNOSIS — D649 Anemia, unspecified: Secondary | ICD-10-CM

## 2023-07-24 DIAGNOSIS — N183 Chronic kidney disease, stage 3 unspecified: Secondary | ICD-10-CM

## 2023-07-24 DIAGNOSIS — E538 Deficiency of other specified B group vitamins: Secondary | ICD-10-CM | POA: Insufficient documentation

## 2023-07-24 DIAGNOSIS — D631 Anemia in chronic kidney disease: Secondary | ICD-10-CM | POA: Insufficient documentation

## 2023-07-24 DIAGNOSIS — N1831 Chronic kidney disease, stage 3a: Secondary | ICD-10-CM | POA: Insufficient documentation

## 2023-07-24 NOTE — Progress Notes (Signed)
Patient came to early for his injections . We fixed schedule and now he is aware of his new schedule.

## 2023-07-29 ENCOUNTER — Other Ambulatory Visit: Payer: Self-pay

## 2023-07-29 DIAGNOSIS — I5020 Unspecified systolic (congestive) heart failure: Secondary | ICD-10-CM

## 2023-07-29 DIAGNOSIS — I712 Thoracic aortic aneurysm, without rupture, unspecified: Secondary | ICD-10-CM

## 2023-08-04 ENCOUNTER — Inpatient Hospital Stay: Payer: Medicare Other

## 2023-08-04 VITALS — BP 156/73 | HR 67 | Temp 98.1°F | Resp 18

## 2023-08-04 DIAGNOSIS — E538 Deficiency of other specified B group vitamins: Secondary | ICD-10-CM | POA: Diagnosis not present

## 2023-08-04 DIAGNOSIS — N1831 Chronic kidney disease, stage 3a: Secondary | ICD-10-CM | POA: Diagnosis present

## 2023-08-04 DIAGNOSIS — D631 Anemia in chronic kidney disease: Secondary | ICD-10-CM | POA: Diagnosis not present

## 2023-08-04 DIAGNOSIS — D649 Anemia, unspecified: Secondary | ICD-10-CM

## 2023-08-04 DIAGNOSIS — N182 Chronic kidney disease, stage 2 (mild): Secondary | ICD-10-CM | POA: Diagnosis not present

## 2023-08-04 DIAGNOSIS — D500 Iron deficiency anemia secondary to blood loss (chronic): Secondary | ICD-10-CM

## 2023-08-04 DIAGNOSIS — D696 Thrombocytopenia, unspecified: Secondary | ICD-10-CM

## 2023-08-04 DIAGNOSIS — N183 Chronic kidney disease, stage 3 unspecified: Secondary | ICD-10-CM

## 2023-08-04 LAB — CBC
HCT: 31.6 % — ABNORMAL LOW (ref 39.0–52.0)
Hemoglobin: 9.8 g/dL — ABNORMAL LOW (ref 13.0–17.0)
MCH: 29.5 pg (ref 26.0–34.0)
MCHC: 31 g/dL (ref 30.0–36.0)
MCV: 95.2 fL (ref 80.0–100.0)
Platelets: 234 10*3/uL (ref 150–400)
RBC: 3.32 MIL/uL — ABNORMAL LOW (ref 4.22–5.81)
RDW: 17 % — ABNORMAL HIGH (ref 11.5–15.5)
WBC: 5 10*3/uL (ref 4.0–10.5)
nRBC: 0 % (ref 0.0–0.2)

## 2023-08-04 MED ORDER — EPOETIN ALFA 40000 UNIT/ML IJ SOLN
40000.0000 [IU] | Freq: Once | INTRAMUSCULAR | Status: AC
  Administered 2023-08-04: 40000 [IU] via SUBCUTANEOUS
  Filled 2023-08-04: qty 1

## 2023-08-04 MED ORDER — CYANOCOBALAMIN 1000 MCG/ML IJ SOLN
1000.0000 ug | Freq: Once | INTRAMUSCULAR | Status: AC
  Administered 2023-08-04: 1000 ug via INTRAMUSCULAR
  Filled 2023-08-04: qty 1

## 2023-08-04 NOTE — Patient Instructions (Signed)
 [facility]  Discharge Instructions: Thank you for choosing [facility] to provide your oncology and hematology care.  If you have a lab appointment with the Cancer Center - please note that after April 8th, 2024, all labs will be drawn in the cancer center.  You do not have to check in or register with the main entrance as you have in the past but will complete your check-in in the cancer center.  Wear comfortable clothing and clothing appropriate for easy access to any Portacath or PICC line.   We strive to give you quality time with your provider. You may need to reschedule your appointment if you arrive late (15 or more minutes).  Arriving late affects you and other patients whose appointments are after yours.  Also, if you miss three or more appointments without notifying the office, you may be dismissed from the clinic at the provider's discretion.      For prescription refill requests, have your pharmacy contact our office and allow 72 hours for refills to be completed.    Today you received the following:  Vitamin B12  Vitamin B12 Injection What is this medication? Vitamin B12 (VAHY tuh min B12) prevents and treats low vitamin B12 levels in your body. It is used in people who do not get enough vitamin B12 from their diet or when their digestive tract does not absorb enough. Vitamin B12 plays an important role in maintaining the health of your nervous system and red blood cells. This medicine may be used for other purposes; ask your health care provider or pharmacist if you have questions. COMMON BRAND NAME(S): B-12 Compliance Kit, B-12 Injection Kit, Cyomin, Dodex, LA-12, Nutri-Twelve, Physicians EZ Use B-12, Primabalt, Vitamin Deficiency Injectable System - B12 What should I tell my care team before I take this medication? They need to know if you have any of these conditions: Kidney disease Leber's disease Megaloblastic anemia An unusual or allergic  reaction to cyanocobalamin, cobalt, other medications, foods, dyes, or preservatives Pregnant or trying to get pregnant Breast-feeding How should I use this medication? This medication is injected into a muscle or deeply under the skin. It is usually given in a clinic or care team's office. However, your care team may teach you how to inject yourself. Follow all instructions. Talk to your care team about the use of this medication in children. Special care may be needed. Overdosage: If you think you have taken too much of this medicine contact a poison control center or emergency room at once. NOTE: This medicine is only for you. Do not share this medicine with others. What if I miss a dose? If you are given your dose at a clinic or care team's office, call to reschedule your appointment. If you give your own injections, and you miss a dose, take it as soon as you can. If it is almost time for your next dose, take only that dose. Do not take double or extra doses. What may interact with this medication? Alcohol Colchicine This list may not describe all possible interactions. Give your health care provider a list of all the medicines, herbs, non-prescription drugs, or dietary supplements you use. Also tell them if you smoke, drink alcohol, or use illegal drugs. Some items may interact with your medicine. What should I watch for while using this medication? Visit your care team regularly. You may need blood work done while you are taking this medication. You may need to follow a  special diet. Talk to your care team. Limit your alcohol intake and avoid smoking to get the best benefit. What side effects may I notice from receiving this medication? Side effects that you should report to your care team as soon as possible: Allergic reactions--skin rash, itching, hives, swelling of the face, lips, tongue, or throat Swelling of the ankles, hands, or feet Trouble breathing Side effects that usually do  not require medical attention (report to your care team if they continue or are bothersome): Diarrhea This list may not describe all possible side effects. Call your doctor for medical advice about side effects. You may report side effects to FDA at 1-800-FDA-1088. Where should I keep my medication? Keep out of the reach of children. Store at room temperature between 15 and 30 degrees C (59 and 85 degrees F). Protect from light. Throw away any unused medication after the expiration date. NOTE: This sheet is a summary. It may not cover all possible information. If you have questions about this medicine, talk to your doctor, pharmacist, or health care provider.  2024 Elsevier/Gold Standard (2021-08-13 00:00:00)     To help prevent nausea and vomiting after your treatment, we encourage you to take your nausea medication as directed.  BELOW ARE SYMPTOMS THAT SHOULD BE REPORTED IMMEDIATELY: *FEVER GREATER THAN 100.4 F (38 C) OR HIGHER *CHILLS OR SWEATING *NAUSEA AND VOMITING THAT IS NOT CONTROLLED WITH YOUR NAUSEA MEDICATION *UNUSUAL SHORTNESS OF BREATH *UNUSUAL BRUISING OR BLEEDING *URINARY PROBLEMS (pain or burning when urinating, or frequent urination) *BOWEL PROBLEMS (unusual diarrhea, constipation, pain near the anus) TENDERNESS IN MOUTH AND THROAT WITH OR WITHOUT PRESENCE OF ULCERS (sore throat, sores in mouth, or a toothache) UNUSUAL RASH, SWELLING OR PAIN  UNUSUAL VAGINAL DISCHARGE OR ITCHING   Items with * indicate a potential emergency and should be followed up as soon as possible or go to the Emergency Department if any problems should occur.  Please show the CHEMOTHERAPY ALERT CARD or IMMUNOTHERAPY ALERT CARD at check-in to the Emergency Department and triage nurse.  Should you have questions after your visit or need to cancel or reschedule your appointment, please contact [facility] [phone]  and follow the prompts.  Office hours are 8:00 a.m. to 4:30  p.m. Monday - Friday. Please note that voicemails left after 4:00 p.m. may not be returned until the following business day.  We are closed weekends and major holidays. You have access to a nurse at all times for urgent questions. Please call the main number to the clinic [phone] and follow the prompts.  For any non-urgent questions, you may also contact your provider using MyChart. We now offer e-Visits for anyone 27 and older to request care online for non-urgent symptoms. For details visit [email or web address].   Also download the MyChart app! Go to the app store, search "MyChart", open the app, select [place], and log in with your MyChart username and password.

## 2023-08-04 NOTE — Progress Notes (Signed)
Patient presents today for Retacrit and Vitamin B12 injections.  Hemoglobin today is 9.8.  Patient is in satisfactory condition with no new complaints voiced.  Vital signs are stable.  We will proceed with injections per provider orders.   Patient tolerated injections well with no complaints voiced.  Patient left ambulatory in stable condition.  Vital signs stable at discharge.  Follow up as scheduled.

## 2023-08-24 ENCOUNTER — Inpatient Hospital Stay: Payer: Medicare Other

## 2023-08-25 ENCOUNTER — Inpatient Hospital Stay: Payer: Medicare Other

## 2023-08-25 ENCOUNTER — Inpatient Hospital Stay: Payer: Medicare Other | Attending: Hematology

## 2023-08-25 VITALS — BP 154/74 | HR 62 | Temp 97.9°F | Resp 18

## 2023-08-25 DIAGNOSIS — N182 Chronic kidney disease, stage 2 (mild): Secondary | ICD-10-CM | POA: Insufficient documentation

## 2023-08-25 DIAGNOSIS — D649 Anemia, unspecified: Secondary | ICD-10-CM

## 2023-08-25 DIAGNOSIS — D631 Anemia in chronic kidney disease: Secondary | ICD-10-CM | POA: Diagnosis not present

## 2023-08-25 DIAGNOSIS — E538 Deficiency of other specified B group vitamins: Secondary | ICD-10-CM | POA: Diagnosis not present

## 2023-08-25 DIAGNOSIS — N1831 Chronic kidney disease, stage 3a: Secondary | ICD-10-CM | POA: Diagnosis present

## 2023-08-25 DIAGNOSIS — D696 Thrombocytopenia, unspecified: Secondary | ICD-10-CM

## 2023-08-25 DIAGNOSIS — D500 Iron deficiency anemia secondary to blood loss (chronic): Secondary | ICD-10-CM

## 2023-08-25 DIAGNOSIS — N183 Chronic kidney disease, stage 3 unspecified: Secondary | ICD-10-CM

## 2023-08-25 LAB — CBC
HCT: 30 % — ABNORMAL LOW (ref 39.0–52.0)
Hemoglobin: 9.6 g/dL — ABNORMAL LOW (ref 13.0–17.0)
MCH: 29.6 pg (ref 26.0–34.0)
MCHC: 32 g/dL (ref 30.0–36.0)
MCV: 92.6 fL (ref 80.0–100.0)
Platelets: 236 10*3/uL (ref 150–400)
RBC: 3.24 MIL/uL — ABNORMAL LOW (ref 4.22–5.81)
RDW: 16.9 % — ABNORMAL HIGH (ref 11.5–15.5)
WBC: 5 10*3/uL (ref 4.0–10.5)
nRBC: 0 % (ref 0.0–0.2)

## 2023-08-25 MED ORDER — EPOETIN ALFA 40000 UNIT/ML IJ SOLN
40000.0000 [IU] | Freq: Once | INTRAMUSCULAR | Status: AC
  Administered 2023-08-25: 40000 [IU] via SUBCUTANEOUS
  Filled 2023-08-25: qty 1

## 2023-08-25 NOTE — Progress Notes (Signed)
Patient tolerated injection with no complaints voiced.  Site clean and dry with no bruising or swelling noted at site.  See MAR for details.  Band aid applied.  Patient stable during and after injection.  Vss with discharge and left in satisfactory condition with no s/s of distress noted.  

## 2023-08-25 NOTE — Patient Instructions (Signed)

## 2023-09-03 ENCOUNTER — Encounter (INDEPENDENT_AMBULATORY_CARE_PROVIDER_SITE_OTHER): Payer: Self-pay | Admitting: Gastroenterology

## 2023-09-03 ENCOUNTER — Ambulatory Visit (INDEPENDENT_AMBULATORY_CARE_PROVIDER_SITE_OTHER): Payer: Medicare Other | Admitting: Gastroenterology

## 2023-09-03 VITALS — BP 114/75 | HR 77 | Temp 98.8°F | Ht 64.5 in | Wt 181.3 lb

## 2023-09-03 DIAGNOSIS — R112 Nausea with vomiting, unspecified: Secondary | ICD-10-CM

## 2023-09-03 DIAGNOSIS — D500 Iron deficiency anemia secondary to blood loss (chronic): Secondary | ICD-10-CM | POA: Diagnosis not present

## 2023-09-03 DIAGNOSIS — K582 Mixed irritable bowel syndrome: Secondary | ICD-10-CM | POA: Insufficient documentation

## 2023-09-03 DIAGNOSIS — K219 Gastro-esophageal reflux disease without esophagitis: Secondary | ICD-10-CM

## 2023-09-03 MED ORDER — PANTOPRAZOLE SODIUM 40 MG PO TBEC: 40.0000 mg | DELAYED_RELEASE_TABLET | Freq: Two times a day (BID) | ORAL | 3 refills | Status: AC

## 2023-09-03 NOTE — Progress Notes (Deleted)
States he is still having issues with acid reflux, noting symptoms of acid regurgitation, 2-3 x per week, this will make his throat sore.   He notes that he has some nausea still, usually in the mornings when he gets up. He feels that this improves as the day goes on. Usually symptoms improve with eating. He has some occasional vomiting, this usually occurs if he eats late and lays down.   Hgb on 9/10 was 9.6, ferritin was high in July, iron was 101 with TIBC 216, sat 47. Still taking PO iron daily, no BRBPR. He does have dark stools on PO iron, still receiving retacrit.  He has continued mix of constipation/looser stools. He takes dulcolax when constipated. Will use pepto bismol some when he has looser stools.   He is still drinking alcohol, usually liquor, states he drinks "off and on" noting sometimes none during the week and just on the weekends but it varies from week to week.   Protonix 40mg  BID ETOH cessation Reflux precautions Benefiber 1T BID to TID

## 2023-09-03 NOTE — Patient Instructions (Addendum)
I have sent protonix 40mg  to take twice daily, to your pharmacy, once you have this, please stop omeprazole For your constipation/diarrhea, let's try over the counter benefiber, start with 1 tablespoon twice a daily with a meal, make sure you are drinking plenty of water, aim for around 64 oz per day Continue with iron pill once daily and follow with blood doctor at [facility]  Avoid greasy, spicy, fried, citrus foods, and be mindful that caffeine, carbonated drinks, chocolate and alcohol can increase reflux symptoms Stay upright 2-3 hours after eating, prior to lying down and avoid eating late in the evenings.   Follow up 3 months

## 2023-09-03 NOTE — Progress Notes (Addendum)
Referring Provider: Juliette Alcide, MD Primary Care Physician:  Juliette Alcide, MD Primary GI Physician: Dr. Levon Hedger   Chief Complaint  Patient presents with   Gastroesophageal Reflux    Follow up on GERD. Takes omeprazole bid and still has some reflux.    Anemia    Follow up on anemia. Takes one iron tablet daily.    Nausea    Has nausea most mornings. Not taking any treatments for nausea.    HPI:   Roger Hansen is a 77 y.o. male with past medical history of  alcohol abuse, anemia, cardiomyopathy, CKD stage 3, GERD, colon polyps, High cholesterol, HTN.   Patient presenting today for follow up of GERD, Anemia, Nausea and constipation/loose stools   Last seen march 2024, at that time swallowing improved since EGD. Having some heartburn on daily PPI. Taking mylanta. Some nausea, no vomiting, weight stable. Hgb in march was 11.4. maintained on PO iron pills daily, receiving retacrit q2 weeks and following with hematology. Having a mix of loose stools and constipation, taking pepto when stools are looser. Still drinking some ETOH, trying to quit.  Recommended to increase PPI to BID, decrease ETOH, continue to follow with hematology.  Present:  States he is still having issues with acid reflux despite BID dosing of omeprazole, noting symptoms of acid regurgitation, 2-3 x per week, this will make his throat sore.    He notes that he has some nausea still, usually in the mornings when he gets up. He feels that this improves as the day goes on. Usually symptoms improve with eating. He has some occasional vomiting, this usually occurs if he eats late and lays down, noting he feels acid coming up prior to vomiting.    Hgb on 9/10 was 9.6, ferritin was 654 in July with iron 101, TIBC 216, sat 47. Still taking PO iron daily, no BRBPR. He does have dark stools on PO iron, still receiving retacrit. No weakness, dizziness or sob.    He has continued mix of constipation/looser stools. He  takes dulcolax when constipated. Will use pepto bismol some when he has looser stools. Has not tried fiber    He is still drinking alcohol, usually liquor, states he drinks "off and on" noting sometimes none during the week and just on the weekends but it varies from week to week. He does not provide any further details regarding how much he is drinking.       Last Colonoscopy:11/2022  - A single non-bleeding colonic angiodysplastic                            lesion. Treated with argon plasma coagulation (APC).                           - Three 2 to 5 mm polyps in the transverse colon                            and in the cecum, removed with a cold snare.                            Resected and retrieved.                           - One 2  mm polyp in the transverse colon, removed                            with a cold biopsy forceps. Resected and retrieved.                           - Diverticulosis in the sigmoid colon. Normal colon                            biopsied.                           - The distal rectum and anal verge are normal on                            retroflexion view.   Last Endoscopy: 11/2022 - No endoscopic esophageal abnormality to explain                            patient's dysphagia. Esophagus dilated. Dilated.                           - Erythematous mucosa in the antrum. Biopsied.                           - Duodenitis. Periampullar area biopsied.  normal gastric biopsies, peptic duodenitis, three colonic tubular adenomas, and normal random colonic biopsies   Recommendations:  Repeat TCS 5 years   Past Medical History:  Diagnosis Date   Alcohol abuse 11/24/2016   Anemia 10/11/2013   Cardiomyopathy (HCC)    a. EF 50% by echo in 2019 with NST showing no reversible ischemia and thought to be alcohol-induced   Chronic renal disease, stage 3, moderately decreased glomerular filtration rate (GFR) between 30-59 mL/min/1.73 square meter (HCC) 12/02/2016    Colon polyps    GERD (gastroesophageal reflux disease)    High cholesterol    Hypertension    Sinus infection     Past Surgical History:  Procedure Laterality Date   BIOPSY  06/03/2019   Procedure: BIOPSY;  Surgeon: Malissa Hippo, MD;  [facility];  Service: Endoscopy;;  esophagus   BIOPSY N/A 11/20/2022   Procedure: RANDOM BIOPSIES;  Surgeon: Dolores Frame, MD;  [facility];  Service: Gastroenterology;  Laterality: N/A;   COLONOSCOPY N/A 10/26/2013   Procedure: COLONOSCOPY;  Surgeon: Malissa Hippo, MD;  [facility];  Service: Endoscopy;  Laterality: N/A;  325-moved to 1230 Ann to notify pt   Colonoscopy with polypectomy     COLONOSCOPY WITH PROPOFOL N/A 06/03/2019   Procedure: COLONOSCOPY WITH PROPOFOL;  Surgeon: Malissa Hippo, MD;  [facility];  Service: Endoscopy;  Laterality: N/A;   COLONOSCOPY WITH PROPOFOL N/A 11/20/2022   Procedure: COLONOSCOPY WITH PROPOFOL;  Surgeon: Dolores Frame, MD;  [facility];  Service: Gastroenterology;  Laterality: N/A;  730 ASA 3   ESOPHAGEAL DILATION N/A 11/20/2022   Procedure: ESOPHAGEAL DILATION;  Surgeon: Dolores Frame, MD;  [facility];  Service: Gastroenterology;  Laterality: N/A;   ESOPHAGOGASTRODUODENOSCOPY (EGD) WITH PROPOFOL N/A 06/03/2019   Procedure: ESOPHAGOGASTRODUODENOSCOPY (EGD) WITH PROPOFOL;  Surgeon: Malissa Hippo, MD;  [facility];  Service: Endoscopy;  Laterality: N/A;   ESOPHAGOGASTRODUODENOSCOPY (EGD) WITH PROPOFOL N/A 11/20/2022   Procedure: ESOPHAGOGASTRODUODENOSCOPY (EGD) WITH PROPOFOL;  Surgeon: Dolores Frame, MD;  [facility];  Service: Gastroenterology;  Laterality: N/A;   HOT HEMOSTASIS  11/20/2022   Procedure: HOT HEMOSTASIS (ARGON PLASMA COAGULATION/BICAP);  Surgeon: Marguerita Merles, Reuel Boom, MD;  [facility];  Service: Gastroenterology;;   POLYPECTOMY  06/03/2019    Procedure: POLYPECTOMY;  Surgeon: Malissa Hippo, MD;  [facility];  Service: Endoscopy;;  colon   POLYPECTOMY  11/20/2022   Procedure: POLYPECTOMY;  Surgeon: Dolores Frame, MD;  [facility];  Service: Gastroenterology;;    Current Outpatient Medications  Medication Sig Dispense Refill   ALPRAZolam (XANAX) 1 MG tablet Take 1 mg by mouth daily.     aspirin 81 MG tablet Take 1 tablet (81 mg total) by mouth daily. 30 tablet    atorvastatin (LIPITOR) 10 MG tablet Take 10 mg by mouth every morning.     buPROPion (WELLBUTRIN XL) 150 MG 24 hr tablet Take 450 mg by mouth every morning.     cholecalciferol (VITAMIN D) 1000 UNITS tablet Take 1,000 Units by mouth 2 (two) times daily.     docusate sodium (STOOL SOFTENER) 100 MG capsule Take 100 mg by mouth every morning.     donepezil (ARICEPT) 10 MG tablet Take 10 mg by mouth daily with breakfast.     Ferrous Sulfate (IRON) 325 (65 Fe) MG TABS Take 1 tablet (325 mg total) by mouth 2 (two) times daily. 180 tablet 3   folic acid (FOLVITE) 1 MG tablet Take 1 mg by mouth daily.     furosemide (LASIX) 20 MG tablet Take 20 mg by mouth daily as needed for fluid or edema.     gabapentin (NEURONTIN) 300 MG capsule Take 300 mg by mouth 3 (three) times daily.     GNP VITAMIN B-1 100 MG tablet Take 100 mg by mouth daily. Thiamine     losartan (COZAAR) 50 MG tablet Take 50 mg by mouth daily.     magnesium oxide (MAG-OX) 400 MG tablet Take 400 mg by mouth 2 (two) times daily.     nitroGLYCERIN (NITROSTAT) 0.4 MG SL tablet Place 1 tablet (0.4 mg total) under the tongue every 5 (five) minutes as needed for chest pain. 25 tablet 3   omeprazole (PRILOSEC) 40 MG capsule Take 1 capsule (40 mg total) by mouth 2 (two) times daily. 120 capsule 5   potassium chloride SA (K-DUR) 20 MEQ tablet Take 20 mEq by mouth daily.      tamsulosin (FLOMAX) 0.4 MG CAPS capsule Take 0.4 mg by mouth daily as needed (Urine problems).     metoprolol  succinate (TOPROL XL) 25 MG 24 hr tablet Take 1 tablet (25 mg total) by mouth daily. (Patient not taking: Reported on 09/03/2023) 90 tablet 3   QUEtiapine (SEROQUEL) 200 MG tablet Take 200 mg by mouth at bedtime. (Patient not taking: Reported on 09/03/2023)     traZODone (DESYREL) 100 MG tablet Take 200 mg by mouth at bedtime. (Patient not taking: Reported on 09/03/2023)     No current facility-administered medications for this visit.    Allergies as of 09/03/2023 - Review Complete 09/03/2023  Allergen Reaction Noted   Horseradish [armoracia rusticana ext (horseradish)] Anaphylaxis 10/16/2016    Family History  Problem Relation Age of Onset   Hypertension Mother  Colon cancer Neg Hx     Social History   Socioeconomic History   Marital status: Married    Spouse name: Not on file   Number of children: Not on file   Years of education: Not on file   Highest education level: Not on file  Occupational History   Not on file  Tobacco Use   Smoking status: Former    Current packs/day: 0.00    Average packs/day: 0.5 packs/day for 20.0 years (10.0 ttl pk-yrs)    Types: Cigarettes    Start date: 12/27/1951    Quit date: 12/27/1971    Years since quitting: 51.7    Passive exposure: Past   Smokeless tobacco: Never  Vaping Use   Vaping status: Never Used  Substance and Sexual Activity   Alcohol use: Yes    Comment: 1 pint of etoh a day ( not recently)   Drug use: No   Sexual activity: Not on file  Other Topics Concern   Not on file  Social History Narrative   Not on file   Social Determinants of Health   Financial Resource Strain: Not on file  Food Insecurity: No Food Insecurity (11/19/2020)   Hunger Vital Sign    Worried About Running Out of Food in the Last Year: Never true    Ran Out of Food in the Last Year: Never true  Transportation Needs: No Transportation Needs (11/19/2020)   PRAPARE - Administrator, Civil Service (Medical): No    Lack of Transportation  (Non-Medical): No  Physical Activity: Inactive (11/19/2020)   Exercise Vital Sign    Days of Exercise per Week: 0 days    Minutes of Exercise per Session: 0 min  Stress: No Stress Concern Present (11/19/2020)   [employer] of Occupational Health - Occupational Stress Questionnaire    Feeling of Stress : Not at all  Social Connections: Not on file    Review of systems General: negative for malaise, night sweats, fever, chills, weight loss Neck: Negative for lumps, goiter, pain and significant neck swelling Resp: Negative for cough, wheezing, dyspnea at rest CV: Negative for chest pain, leg swelling, palpitations, orthopnea GI: denies melena, hematochezia, dysphagia, odyonophagia, early satiety or unintentional weight loss. +nausea+vomiting +GERD +constipation +Diarrhea  MSK: Negative for joint pain or swelling, back pain, and muscle pain. Derm: Negative for itching or rash Psych: Denies depression, anxiety, memory loss, confusion. No homicidal or suicidal ideation.  Heme: Negative for prolonged bleeding, bruising easily, and swollen nodes. Endocrine: Negative for cold or heat intolerance, polyuria, polydipsia and goiter. Neuro: negative for tremor, gait imbalance, syncope and seizures. The remainder of the review of systems is noncontributory.  Physical Exam: BP 114/75 (BP Location: Left Arm, Patient Position: Sitting, Cuff Size: Large)   Pulse 77   Temp 98.8 F (37.1 C) (Oral)   Ht 5' 4.5" (1.638 m)   Wt 181 lb 4.8 oz (82.2 kg)   BMI 30.64 kg/m  General:   Alert and oriented. No distress noted. Pleasant and cooperative.  Head:  Normocephalic and atraumatic. Eyes:  Conjuctiva clear without scleral icterus. Mouth:  Oral mucosa pink and moist. Good dentition. No lesions. Heart: Normal rate and rhythm, s1 and s2 heart sounds present.  Lungs: Clear lung sounds in all lobes. Respirations equal and unlabored. Abdomen:  +BS, soft, non-tender and non-distended. No rebound or  guarding. No HSM or masses noted. Derm: No palmar erythema or jaundice Msk:  Symmetrical without gross deformities. Normal posture. Extremities:  Without edema. Neurologic:  Alert and  oriented x4 Psych:  Alert and cooperative. Normal mood and affect.  Invalid input(s): "6 MONTHS"   ASSESSMENT: BRODIX SALVINO is a 77 y.o. male presenting today for follow up of GERD, anemia, nausea and IBS   GERD: Still with breakthrough acid agitation 2-3 times per week on omeprazole 40 mg twice daily.  Will stop omeprazole and start pantoprazole 40 mg twice daily.  He needs to make sure he is practicing good reflux precautions.  Anemia: Hemoglobin is stable last was 9.6 earlier this month.  He continues to follow with hematology and is receiving Retacrit injections as well as taking daily p.o. iron.  Iron studies in July were good, ferritin was actually high.  He denies any rectal bleeding, shortness of breath, dizziness, weakness.  Should continue on p.o. iron and to follow with hematology.  Nausea/vomiting: Continues to have some nausea in the mornings that seems to improve with eating, he notes some vomiting usually at night if he has eaten late and lay down soon after, query if this is more related to his uncontrolled GERD.  His appetite is good and weight is stable.  EGD in December 2023 as outlined above.  Hopefully his nausea will improve some with better control of his GERD.  IBS-Mixed: Continues to have mix of constipation and looser stools.  Alternates taking Dulcolax and Pepto-Bismol depending on what symptoms he has.  Suspect he has some underlying IBS.  Recommend starting Benefiber 1 tablespoon twice daily with a meal.  He needs to ensure that he is drinking plenty of water, aim for 64 ounces per day.  PLAN:  Stop omeprazole 2. Start protonix 40mg  BID  3. Benefiber 1T BID with meals, good water intake 4. Continue PO IRON and following with hematology 5. Good reflux precautions 6. Continue to  reduce ETOH intake  All questions were answered, patient verbalized understanding and is in agreement with plan as outlined above.    Follow Up: 3 months   Halie Gass L. Jeanmarie Hubert, MSN, APRN, AGNP-C Adult-Gerontology Nurse Practitioner [facility]  I have reviewed the note and agree with the APP's assessment as described in this progress note  If not presenting improvement with different PPI, would favor performing repeat EGD with [facility] testing on PPI and give TIF pamphlet.  Katrinka Blazing, MD Gastroenterology and Hepatology [facility]

## 2023-09-15 ENCOUNTER — Inpatient Hospital Stay: Payer: Medicare Other

## 2023-09-15 ENCOUNTER — Inpatient Hospital Stay: Payer: Medicare Other | Attending: Hematology

## 2023-09-15 VITALS — BP 170/85 | HR 65 | Temp 98.2°F | Resp 16

## 2023-09-15 DIAGNOSIS — E538 Deficiency of other specified B group vitamins: Secondary | ICD-10-CM | POA: Insufficient documentation

## 2023-09-15 DIAGNOSIS — N182 Chronic kidney disease, stage 2 (mild): Secondary | ICD-10-CM | POA: Diagnosis not present

## 2023-09-15 DIAGNOSIS — D649 Anemia, unspecified: Secondary | ICD-10-CM

## 2023-09-15 DIAGNOSIS — D631 Anemia in chronic kidney disease: Secondary | ICD-10-CM | POA: Diagnosis not present

## 2023-09-15 DIAGNOSIS — D500 Iron deficiency anemia secondary to blood loss (chronic): Secondary | ICD-10-CM

## 2023-09-15 DIAGNOSIS — D696 Thrombocytopenia, unspecified: Secondary | ICD-10-CM

## 2023-09-15 DIAGNOSIS — N183 Chronic kidney disease, stage 3 unspecified: Secondary | ICD-10-CM

## 2023-09-15 LAB — CBC
HCT: 29.4 % — ABNORMAL LOW (ref 39.0–52.0)
Hemoglobin: 9.2 g/dL — ABNORMAL LOW (ref 13.0–17.0)
MCH: 29 pg (ref 26.0–34.0)
MCHC: 31.3 g/dL (ref 30.0–36.0)
MCV: 92.7 fL (ref 80.0–100.0)
Platelets: 201 10*3/uL (ref 150–400)
RBC: 3.17 MIL/uL — ABNORMAL LOW (ref 4.22–5.81)
RDW: 18 % — ABNORMAL HIGH (ref 11.5–15.5)
WBC: 4.6 10*3/uL (ref 4.0–10.5)
nRBC: 0 % (ref 0.0–0.2)

## 2023-09-15 MED ORDER — EPOETIN ALFA 40000 UNIT/ML IJ SOLN
40000.0000 [IU] | Freq: Once | INTRAMUSCULAR | Status: AC
  Administered 2023-09-15: 40000 [IU] via SUBCUTANEOUS
  Filled 2023-09-15: qty 1

## 2023-09-15 MED ORDER — CYANOCOBALAMIN 1000 MCG/ML IJ SOLN
1000.0000 ug | Freq: Once | INTRAMUSCULAR | Status: AC
  Administered 2023-09-15: 1000 ug via INTRAMUSCULAR
  Filled 2023-09-15: qty 1

## 2023-09-15 NOTE — Progress Notes (Signed)
Retacrit and B12 injections given per orders. Patient tolerated it well without problems. Vitals stable and discharged home from clinic ambulatory. Follow up as scheduled.

## 2023-09-24 ENCOUNTER — Inpatient Hospital Stay: Payer: Medicare Other

## 2023-10-06 ENCOUNTER — Inpatient Hospital Stay: Payer: Medicare Other

## 2023-10-06 ENCOUNTER — Other Ambulatory Visit: Payer: Medicare Other

## 2023-10-06 ENCOUNTER — Ambulatory Visit: Payer: Medicare Other

## 2023-10-08 ENCOUNTER — Inpatient Hospital Stay: Payer: Medicare Other

## 2023-10-08 ENCOUNTER — Inpatient Hospital Stay: Payer: Medicare Other | Admitting: Oncology

## 2023-10-08 ENCOUNTER — Encounter: Payer: Self-pay | Admitting: Oncology

## 2023-10-08 VITALS — BP 125/75 | HR 84 | Temp 98.6°F | Resp 18 | Wt 181.4 lb

## 2023-10-08 DIAGNOSIS — E538 Deficiency of other specified B group vitamins: Secondary | ICD-10-CM

## 2023-10-08 DIAGNOSIS — D631 Anemia in chronic kidney disease: Secondary | ICD-10-CM

## 2023-10-08 DIAGNOSIS — N183 Chronic kidney disease, stage 3 unspecified: Secondary | ICD-10-CM

## 2023-10-08 DIAGNOSIS — Z87442 Personal history of urinary calculi: Secondary | ICD-10-CM | POA: Diagnosis not present

## 2023-10-08 DIAGNOSIS — M545 Low back pain, unspecified: Secondary | ICD-10-CM | POA: Diagnosis not present

## 2023-10-08 DIAGNOSIS — N1831 Chronic kidney disease, stage 3a: Secondary | ICD-10-CM | POA: Diagnosis not present

## 2023-10-08 DIAGNOSIS — N281 Cyst of kidney, acquired: Secondary | ICD-10-CM | POA: Diagnosis not present

## 2023-10-08 DIAGNOSIS — R109 Unspecified abdominal pain: Secondary | ICD-10-CM | POA: Diagnosis not present

## 2023-10-08 DIAGNOSIS — Z87891 Personal history of nicotine dependence: Secondary | ICD-10-CM | POA: Diagnosis not present

## 2023-10-08 DIAGNOSIS — K8020 Calculus of gallbladder without cholecystitis without obstruction: Secondary | ICD-10-CM | POA: Diagnosis not present

## 2023-10-08 DIAGNOSIS — D696 Thrombocytopenia, unspecified: Secondary | ICD-10-CM

## 2023-10-08 DIAGNOSIS — D500 Iron deficiency anemia secondary to blood loss (chronic): Secondary | ICD-10-CM

## 2023-10-08 DIAGNOSIS — D649 Anemia, unspecified: Secondary | ICD-10-CM

## 2023-10-08 DIAGNOSIS — Z66 Do not resuscitate: Secondary | ICD-10-CM | POA: Diagnosis not present

## 2023-10-08 DIAGNOSIS — Z79899 Other long term (current) drug therapy: Secondary | ICD-10-CM | POA: Diagnosis not present

## 2023-10-08 DIAGNOSIS — N182 Chronic kidney disease, stage 2 (mild): Secondary | ICD-10-CM | POA: Diagnosis not present

## 2023-10-08 DIAGNOSIS — K4090 Unilateral inguinal hernia, without obstruction or gangrene, not specified as recurrent: Secondary | ICD-10-CM | POA: Diagnosis not present

## 2023-10-08 LAB — CBC
HCT: 31.4 % — ABNORMAL LOW (ref 39.0–52.0)
Hemoglobin: 9.8 g/dL — ABNORMAL LOW (ref 13.0–17.0)
MCH: 28.8 pg (ref 26.0–34.0)
MCHC: 31.2 g/dL (ref 30.0–36.0)
MCV: 92.4 fL (ref 80.0–100.0)
Platelets: 145 10*3/uL — ABNORMAL LOW (ref 150–400)
RBC: 3.4 MIL/uL — ABNORMAL LOW (ref 4.22–5.81)
RDW: 18.3 % — ABNORMAL HIGH (ref 11.5–15.5)
WBC: 6.2 10*3/uL (ref 4.0–10.5)
nRBC: 0 % (ref 0.0–0.2)

## 2023-10-08 MED ORDER — CYANOCOBALAMIN 1000 MCG/ML IJ SOLN
1000.0000 ug | Freq: Once | INTRAMUSCULAR | Status: AC
  Administered 2023-10-08: 1000 ug via INTRAMUSCULAR
  Filled 2023-10-08: qty 1

## 2023-10-08 MED ORDER — EPOETIN ALFA 40000 UNIT/ML IJ SOLN
40000.0000 [IU] | Freq: Once | INTRAMUSCULAR | Status: AC
  Administered 2023-10-08: 40000 [IU] via SUBCUTANEOUS
  Filled 2023-10-08: qty 1

## 2023-10-08 NOTE — Progress Notes (Signed)
Patient tolerated injection with no complaints voiced.  Site clean and dry with no bruising or swelling noted at site.  See MAR for details.  Band aid applied.  Patient stable during and after injection.  Vss with discharge and left in satisfactory condition with no s/s of distress noted.  

## 2023-10-08 NOTE — Progress Notes (Signed)
[facility] [address], [place] [address]   CLINIC:  Medical Oncology/Hematology  PCP:  Juliette Alcide, MD [address] [place] [address] [phone]   REASON FOR VISIT:  Follow-up for anemia of CKD and iron deficiency   PRIOR THERAPY: None   CURRENT THERAPY: Retacrit injections weekly; intermittent parenteral iron therapy; oral iron supplement  INTERVAL HISTORY:   [name] 77 y.o. male returns for routine follow-up of his normocytic anemia.  This he was last seen in clinic by me on 06/23/23.   He denies any interval hospitalizations.  Tolerating Retacrit well.   He has been receiving Retacrit every 3 weeks and tolerating well. Hemoglobin stable around 9.5-10.  Has intermittent constipation due to his iron supplements.  He is taking iron twice daily.  Has occasional PTSD flares.  He denies any bleeding.  Has occasional shortness of breath especially with exertion.  Appetite is 100% and energy levels are 75%.  He has pain in his lower back which is chronic and has improved since he started gabapentin. He is followed by his PCP for this.   ASSESSMENT & PLAN:  1.   Normocytic anemia - His anemia dates back to at least 2014 with a negative peripheral work-up, negative bone marrow aspiration and biopsy and cytogenetics on 10/16/2016 (obtained in the setting of heavy alcohol use). The bone marrow showed slightly hypercellular marrow with trilineage hematopoiesis. Chromosome analysis was normal. - MGUS/myeloma panel (09/20/2021) was negative.  Hemolysis work-up was negative.   - He has been receiving intermittent Epogen and parenteral iron therapy since June 2020. - He is a Jehovah witness and does not receive blood products. - EGD (11/20/2022): Erythematous gastric mucosa, duodenitis - Colonoscopy (11/20/2022): Single nonbleeding angiodysplastic lesion treated with APC, polyps x 4 (pathology benign), diverticulosis - Currently receiving Retacrit 40,000 units  every 3 week, tolerating this well.   - He continues to take iron tablets 325 mg once daily.    - He has required intermittent IV iron.  - He reports intermittent black bowel movements.  He denies any bright red blood per rectum, hematemesis, or epistaxis.     - Labs today (10/08/23): Hgb 9.8/MCV 92.4 Labs from 06/16/23 show ferritin 654, creatinine 1.19, iron saturation 47% with low TIBC 216.  -Etiology thought to be secondary to CKD, functional iron deficiency and anemia from chronic disease. Occult GI blood loss possible - he does have intermittent drops in his Hgb and presence of nonbleeding AVM in colon on most recent colonoscopy but without associated drops in iron Patient reports that he has been anemic ever since [place] and had significant agent orange exposure, which has been associated with aplastic anemia and other conditions. Would also consider possible early MDS (bone marrow biopsy in 2017 showed subtle dyspoietic changes that were nondiagnostic and nonspecific at that time) Myelosuppression from alcohol consumption.  Prior history of heavy alcohol use (from 2007-2017), but has lately been drinking about a fifth of liquor each week. -We discussed if he continues to have worsening anemia, repeat bone marrow biopsy would be reasonable. -Continue 40,000 units of Retacrit every 3 weeks. -Continue iron tab every other day along with a stool softener for constipation. -Return to clinic in 3 months with labs and see provider same day.    2.  B12 deficiency - Labs from 10/07/2021 showed normal B12 433, but elevated methylmalonic acid at 470, signifying mild B12 deficiency and despite patient taking taking B12 daily 1,000 mcg -  Patient was started on monthly B12 injections in November 2022 - B12 normal at 426 with normal MMA as of 10/10/2022 -B12 level on 06/16/2023 was 355 and MMA is 251. -Continue monthly B12 injections and will recheck levels in approximately 3 months.  3.  CKD stage II  with intermittent AKI - Most recent creatinine was 1.15 previously 1.19. -Continue follow-up with PCP for CKD. -Repeat CMP in 3 months.   PLAN SUMMARY: >> CBC + Retacrit every 3 weeks >> B12 injections every 3 weeks. >> Labs in 3 months (CBC/D, CMP, ferritin, iron/TIBC, B12, MMA) >> Office visit in 3 months (at least 1 week after labs)     REVIEW OF SYSTEMS:   Review of Systems  Respiratory:  Positive for chest tightness.   Musculoskeletal:  Positive for back pain.  Neurological:  Positive for dizziness, headaches and numbness.  Psychiatric/Behavioral:  Positive for depression. The patient is nervous/anxious.      PHYSICAL EXAM:  ECOG PERFORMANCE STATUS: 1 - Symptomatic but completely ambulatory  There were no vitals filed for this visit.  There were no vitals filed for this visit.  Physical Exam Constitutional:      Appearance: Normal appearance.  Cardiovascular:     Rate and Rhythm: Normal rate and regular rhythm.  Pulmonary:     Effort: Pulmonary effort is normal.     Breath sounds: Normal breath sounds.  Abdominal:     General: Bowel sounds are normal.     Palpations: Abdomen is soft.  Musculoskeletal:        General: No swelling. Normal range of motion.  Neurological:     Mental Status: He is alert and oriented to person, place, and time. Mental status is at baseline.     PAST MEDICAL/SURGICAL HISTORY:  Past Medical History:  Diagnosis Date   Alcohol abuse 11/24/2016   Anemia 10/11/2013   Cardiomyopathy (HCC)    a. EF 50% by echo in 2019 with NST showing no reversible ischemia and thought to be alcohol-induced   Chronic renal disease, stage 3, moderately decreased glomerular filtration rate (GFR) between 30-59 mL/min/1.73 square meter (HCC) 12/02/2016   Colon polyps    GERD (gastroesophageal reflux disease)    High cholesterol    Hypertension    Sinus infection    Past Surgical History:  Procedure Laterality Date   BIOPSY  06/03/2019   Procedure:  BIOPSY;  Surgeon: Malissa Hippo, MD;  [facility];  Service: Endoscopy;;  esophagus   BIOPSY N/A 11/20/2022   Procedure: RANDOM BIOPSIES;  Surgeon: Dolores Frame, MD;  [facility];  Service: Gastroenterology;  Laterality: N/A;   COLONOSCOPY N/A 10/26/2013   Procedure: COLONOSCOPY;  Surgeon: Malissa Hippo, MD;  [facility];  Service: Endoscopy;  Laterality: N/A;  325-moved to 1230 Ann to notify pt   Colonoscopy with polypectomy     COLONOSCOPY WITH PROPOFOL N/A 06/03/2019   Procedure: COLONOSCOPY WITH PROPOFOL;  Surgeon: Malissa Hippo, MD;  [facility];  Service: Endoscopy;  Laterality: N/A;   COLONOSCOPY WITH PROPOFOL N/A 11/20/2022   Procedure: COLONOSCOPY WITH PROPOFOL;  Surgeon: Dolores Frame, MD;  [facility];  Service: Gastroenterology;  Laterality: N/A;  730 ASA 3   ESOPHAGEAL DILATION N/A 11/20/2022   Procedure: ESOPHAGEAL DILATION;  Surgeon: Dolores Frame, MD;  [facility];  Service: Gastroenterology;  Laterality: N/A;   ESOPHAGOGASTRODUODENOSCOPY (EGD) WITH PROPOFOL N/A 06/03/2019   Procedure: ESOPHAGOGASTRODUODENOSCOPY (EGD) WITH  PROPOFOL;  Surgeon: Malissa Hippo, MD;  [facility];  Service: Endoscopy;  Laterality: N/A;   ESOPHAGOGASTRODUODENOSCOPY (EGD) WITH PROPOFOL N/A 11/20/2022   Procedure: ESOPHAGOGASTRODUODENOSCOPY (EGD) WITH PROPOFOL;  Surgeon: Dolores Frame, MD;  [facility];  Service: Gastroenterology;  Laterality: N/A;   HOT HEMOSTASIS  11/20/2022   Procedure: HOT HEMOSTASIS (ARGON PLASMA COAGULATION/BICAP);  Surgeon: Marguerita Merles, Reuel Boom, MD;  [facility];  Service: Gastroenterology;;   POLYPECTOMY  06/03/2019   Procedure: POLYPECTOMY;  Surgeon: Malissa Hippo, MD;  [facility];  Service: Endoscopy;;  colon   POLYPECTOMY  11/20/2022   Procedure: POLYPECTOMY;  Surgeon: Dolores Frame, MD;   [facility];  Service: Gastroenterology;;    SOCIAL HISTORY:  Social History   Socioeconomic History   Marital status: Married    Spouse name: Not on file   Number of children: Not on file   Years of education: Not on file   Highest education level: Not on file  Occupational History   Not on file  Tobacco Use   Smoking status: Former    Current packs/day: 0.00    Average packs/day: 0.5 packs/day for 20.0 years (10.0 ttl pk-yrs)    Types: Cigarettes    Start date: 12/27/1951    Quit date: 12/27/1971    Years since quitting: 51.8    Passive exposure: Past   Smokeless tobacco: Never  Vaping Use   Vaping status: Never Used  Substance and Sexual Activity   Alcohol use: Yes    Comment: 1 pint of etoh a day ( not recently)   Drug use: No   Sexual activity: Not on file  Other Topics Concern   Not on file  Social History Narrative   Not on file   Social Determinants of Health   Financial Resource Strain: Not on file  Food Insecurity: No Food Insecurity (11/19/2020)   Hunger Vital Sign    Worried About Running Out of Food in the Last Year: Never true    Ran Out of Food in the Last Year: Never true  Transportation Needs: No Transportation Needs (11/19/2020)   PRAPARE - Administrator, Civil Service (Medical): No    Lack of Transportation (Non-Medical): No  Physical Activity: Inactive (11/19/2020)   Exercise Vital Sign    Days of Exercise per Week: 0 days    Minutes of Exercise per Session: 0 min  Stress: No Stress Concern Present (11/19/2020)   [employer] of Occupational Health - Occupational Stress Questionnaire    Feeling of Stress : Not at all  Social Connections: Not on file  Intimate Partner Violence: Not At Risk (11/19/2020)   Humiliation, Afraid, Rape, and Kick questionnaire    Fear of Current or Ex-Partner: No    Emotionally Abused: No    Physically Abused: No    Sexually Abused: No    FAMILY HISTORY:  Family History  Problem  Relation Age of Onset   Hypertension Mother    Colon cancer Neg Hx     CURRENT MEDICATIONS:  Outpatient Encounter Medications as of 10/08/2023  Medication Sig Note   ALPRAZolam (XANAX) 1 MG tablet Take 1 mg by mouth daily.    aspirin 81 MG tablet Take 1 tablet (81 mg total) by mouth daily.    atorvastatin (LIPITOR) 10 MG tablet Take 10 mg by mouth every morning.    buPROPion (WELLBUTRIN XL) 150 MG 24 hr tablet Take 450 mg by mouth  every morning.    cholecalciferol (VITAMIN D) 1000 UNITS tablet Take 1,000 Units by mouth 2 (two) times daily.    docusate sodium (STOOL SOFTENER) 100 MG capsule Take 100 mg by mouth every morning.    donepezil (ARICEPT) 10 MG tablet Take 10 mg by mouth daily with breakfast.    Ferrous Sulfate (IRON) 325 (65 Fe) MG TABS Take 1 tablet (325 mg total) by mouth 2 (two) times daily. 03/03/2023: One daily   folic acid (FOLVITE) 1 MG tablet Take 1 mg by mouth daily.    furosemide (LASIX) 20 MG tablet Take 20 mg by mouth daily as needed for fluid or edema.    gabapentin (NEURONTIN) 300 MG capsule Take 300 mg by mouth 3 (three) times daily.    GNP VITAMIN B-1 100 MG tablet Take 100 mg by mouth daily. Thiamine    losartan (COZAAR) 50 MG tablet Take 50 mg by mouth daily.    magnesium oxide (MAG-OX) 400 MG tablet Take 400 mg by mouth 2 (two) times daily.    metoprolol succinate (TOPROL XL) 25 MG 24 hr tablet Take 1 tablet (25 mg total) by mouth daily.    nitroGLYCERIN (NITROSTAT) 0.4 MG SL tablet Place 1 tablet (0.4 mg total) under the tongue every 5 (five) minutes as needed for chest pain.    pantoprazole (PROTONIX) 40 MG tablet Take 1 tablet (40 mg total) by mouth 2 (two) times daily.    potassium chloride SA (K-DUR) 20 MEQ tablet Take 20 mEq by mouth daily.     QUEtiapine (SEROQUEL) 200 MG tablet Take 200 mg by mouth at bedtime.    tamsulosin (FLOMAX) 0.4 MG CAPS capsule Take 0.4 mg by mouth daily as needed (Urine problems).    traZODone (DESYREL) 100 MG tablet Take 200  mg by mouth at bedtime.    No facility-administered encounter medications on file as of 10/08/2023.    ALLERGIES:  Allergies  Allergen Reactions   Horseradish [Armoracia Rusticana Ext (Horseradish)] Anaphylaxis    LABORATORY DATA:  I have reviewed the labs as listed.  CBC    Component Value Date/Time   WBC 4.6 09/15/2023 0906   RBC 3.17 (L) 09/15/2023 0906   HGB 9.2 (L) 09/15/2023 0906   HCT 29.4 (L) 09/15/2023 0906   HCT 27.9 (L) 08/13/2016 0944   PLT 201 09/15/2023 0906   MCV 92.7 09/15/2023 0906   MCH 29.0 09/15/2023 0906   MCHC 31.3 09/15/2023 0906   RDW 18.0 (H) 09/15/2023 0906   LYMPHSABS 1.6 06/16/2023 1006   MONOABS 0.7 06/16/2023 1006   EOSABS 0.1 06/16/2023 1006   BASOSABS 0.0 06/16/2023 1006      Latest Ref Rng & Units 06/16/2023   10:06 AM 03/12/2023    8:16 AM 11/19/2022    8:40 AM  CMP  Glucose 70 - 99 mg/dL 865  784  94   BUN 8 - 23 mg/dL 12  11  25    Creatinine 0.61 - 1.24 mg/dL 6.96  2.95  2.84   Sodium 135 - 145 mmol/L 137  135  138   Potassium 3.5 - 5.1 mmol/L 3.8  3.6  5.1   Chloride 98 - 111 mmol/L 102  94  102   CO2 22 - 32 mmol/L 27  30  26    Calcium 8.9 - 10.3 mg/dL 9.1  9.3  9.7   Total Protein 6.5 - 8.1 g/dL 6.7  7.0    Total Bilirubin 0.3 - 1.2 mg/dL 1.5  1.1  Alkaline Phos 38 - 126 U/L 63  76    AST 15 - 41 U/L 21  43    ALT 0 - 44 U/L 16  17      DIAGNOSTIC IMAGING:  I have independently reviewed the relevant imaging and discussed with the patient.   WRAP UP:  All questions were answered. The patient knows to call the clinic with any problems, questions or concerns.  Medical decision making: Moderate  Time spent on visit: I spent 20 minutes dedicated to the care of this patient (face-to-face and non-face-to-face) on the date of the encounter to include what is described in the assessment and plan.  Mauro Kaufmann, NP  03/12/2023 10:20 AM

## 2023-10-29 ENCOUNTER — Inpatient Hospital Stay: Payer: Medicare Other | Attending: Hematology

## 2023-10-29 ENCOUNTER — Inpatient Hospital Stay: Payer: Medicare Other

## 2023-10-29 VITALS — BP 139/67 | HR 82 | Temp 98.4°F | Resp 18

## 2023-10-29 DIAGNOSIS — D500 Iron deficiency anemia secondary to blood loss (chronic): Secondary | ICD-10-CM

## 2023-10-29 DIAGNOSIS — N182 Chronic kidney disease, stage 2 (mild): Secondary | ICD-10-CM | POA: Diagnosis not present

## 2023-10-29 DIAGNOSIS — D509 Iron deficiency anemia, unspecified: Secondary | ICD-10-CM | POA: Insufficient documentation

## 2023-10-29 DIAGNOSIS — E538 Deficiency of other specified B group vitamins: Secondary | ICD-10-CM | POA: Insufficient documentation

## 2023-10-29 DIAGNOSIS — N1831 Chronic kidney disease, stage 3a: Secondary | ICD-10-CM | POA: Diagnosis present

## 2023-10-29 DIAGNOSIS — D696 Thrombocytopenia, unspecified: Secondary | ICD-10-CM

## 2023-10-29 DIAGNOSIS — D649 Anemia, unspecified: Secondary | ICD-10-CM

## 2023-10-29 DIAGNOSIS — N183 Chronic kidney disease, stage 3 unspecified: Secondary | ICD-10-CM

## 2023-10-29 DIAGNOSIS — E611 Iron deficiency: Secondary | ICD-10-CM | POA: Diagnosis not present

## 2023-10-29 DIAGNOSIS — D631 Anemia in chronic kidney disease: Secondary | ICD-10-CM | POA: Insufficient documentation

## 2023-10-29 LAB — CBC
HCT: 31.3 % — ABNORMAL LOW (ref 39.0–52.0)
Hemoglobin: 10.2 g/dL — ABNORMAL LOW (ref 13.0–17.0)
MCH: 29.8 pg (ref 26.0–34.0)
MCHC: 32.6 g/dL (ref 30.0–36.0)
MCV: 91.5 fL (ref 80.0–100.0)
Platelets: 129 10*3/uL — ABNORMAL LOW (ref 150–400)
RBC: 3.42 MIL/uL — ABNORMAL LOW (ref 4.22–5.81)
RDW: 18.6 % — ABNORMAL HIGH (ref 11.5–15.5)
WBC: 5.1 10*3/uL (ref 4.0–10.5)
nRBC: 0 % (ref 0.0–0.2)

## 2023-10-29 MED ORDER — EPOETIN ALFA 40000 UNIT/ML IJ SOLN
40000.0000 [IU] | Freq: Once | INTRAMUSCULAR | Status: AC
Start: 2023-10-29 — End: 2023-10-29
  Administered 2023-10-29: 40000 [IU] via SUBCUTANEOUS
  Filled 2023-10-29: qty 1

## 2023-10-29 MED ORDER — CYANOCOBALAMIN 1000 MCG/ML IJ SOLN
1000.0000 ug | Freq: Once | INTRAMUSCULAR | Status: AC
  Administered 2023-10-29: 1000 ug via INTRAMUSCULAR
  Filled 2023-10-29: qty 1

## 2023-10-29 NOTE — Patient Instructions (Signed)
[facility] - A DEPT OF [facility]  Discharge Instructions: Thank you for choosing [facility] to provide your oncology and hematology care.  If you have a lab appointment with the Cancer Center - please note that after April 8th, 2024, all labs will be drawn in the cancer center.  You do not have to check in or register with the main entrance as you have in the past but will complete your check-in in the cancer center.  Wear comfortable clothing and clothing appropriate for easy access to any Portacath or PICC line.   We strive to give you quality time with your provider. You may need to reschedule your appointment if you arrive late (15 or more minutes).  Arriving late affects you and other patients whose appointments are after yours.  Also, if you miss three or more appointments without notifying the office, you may be dismissed from the clinic at the provider's discretion.      For prescription refill requests, have your pharmacy contact our office and allow 72 hours for refills to be completed.    Today you received the following chemotherapy and/or immunotherapy agents B12 Retacrit      To help prevent nausea and vomiting after your treatment, we encourage you to take your nausea medication as directed.  BELOW ARE SYMPTOMS THAT SHOULD BE REPORTED IMMEDIATELY: *FEVER GREATER THAN 100.4 F (38 C) OR HIGHER *CHILLS OR SWEATING *NAUSEA AND VOMITING THAT IS NOT CONTROLLED WITH YOUR NAUSEA MEDICATION *UNUSUAL SHORTNESS OF BREATH *UNUSUAL BRUISING OR BLEEDING *URINARY PROBLEMS (pain or burning when urinating, or frequent urination) *BOWEL PROBLEMS (unusual diarrhea, constipation, pain near the anus) TENDERNESS IN MOUTH AND THROAT WITH OR WITHOUT PRESENCE OF ULCERS (sore throat, sores in mouth, or a toothache) UNUSUAL RASH, SWELLING OR PAIN  UNUSUAL VAGINAL DISCHARGE OR ITCHING   Items with * indicate a potential emergency and should be  followed up as soon as possible or go to the Emergency Department if any problems should occur.  Please show the CHEMOTHERAPY ALERT CARD or IMMUNOTHERAPY ALERT CARD at check-in to the Emergency Department and triage nurse.  Should you have questions after your visit or need to cancel or reschedule your appointment, please contact [facility] - A DEPT OF [facility] [phone]  and follow the prompts.  Office hours are 8:00 a.m. to 4:30 p.m. Monday - Friday. Please note that voicemails left after 4:00 p.m. may not be returned until the following business day.  We are closed weekends and major holidays. You have access to a nurse at all times for urgent questions. Please call the main number to the clinic [phone] and follow the prompts.  For any non-urgent questions, you may also contact your provider using MyChart. We now offer e-Visits for anyone 74 and older to request care online for non-urgent symptoms. For details visit [email or web address].   Also download the MyChart app! Go to the app store, search "MyChart", open the app, select , and log in with your MyChart username and password.

## 2023-10-29 NOTE — Progress Notes (Signed)
Patient presents today for B12 and Retacrit injections per providers order.  Vital signs WNL.  Patient has no new complaints at this time. Stable duirng administration without incident; injection site WNL; see MAR for injection details.  Patient tolerated procedure well and without incident.  No questions or complaints noted at this time.

## 2023-11-20 ENCOUNTER — Inpatient Hospital Stay: Payer: Medicare Other | Attending: Hematology

## 2023-11-20 VITALS — BP 124/66 | HR 77 | Temp 97.9°F

## 2023-11-20 DIAGNOSIS — N183 Chronic kidney disease, stage 3 unspecified: Secondary | ICD-10-CM

## 2023-11-20 DIAGNOSIS — E538 Deficiency of other specified B group vitamins: Secondary | ICD-10-CM | POA: Diagnosis not present

## 2023-11-20 DIAGNOSIS — N182 Chronic kidney disease, stage 2 (mild): Secondary | ICD-10-CM | POA: Diagnosis not present

## 2023-11-20 DIAGNOSIS — N1831 Chronic kidney disease, stage 3a: Secondary | ICD-10-CM | POA: Diagnosis present

## 2023-11-20 DIAGNOSIS — D500 Iron deficiency anemia secondary to blood loss (chronic): Secondary | ICD-10-CM

## 2023-11-20 DIAGNOSIS — D631 Anemia in chronic kidney disease: Secondary | ICD-10-CM | POA: Insufficient documentation

## 2023-11-20 DIAGNOSIS — D696 Thrombocytopenia, unspecified: Secondary | ICD-10-CM

## 2023-11-20 DIAGNOSIS — D649 Anemia, unspecified: Secondary | ICD-10-CM

## 2023-11-20 LAB — CBC
HCT: 29.2 % — ABNORMAL LOW (ref 39.0–52.0)
Hemoglobin: 9.2 g/dL — ABNORMAL LOW (ref 13.0–17.0)
MCH: 29 pg (ref 26.0–34.0)
MCHC: 31.5 g/dL (ref 30.0–36.0)
MCV: 92.1 fL (ref 80.0–100.0)
Platelets: 181 10*3/uL (ref 150–400)
RBC: 3.17 MIL/uL — ABNORMAL LOW (ref 4.22–5.81)
RDW: 17.8 % — ABNORMAL HIGH (ref 11.5–15.5)
WBC: 5.4 10*3/uL (ref 4.0–10.5)
nRBC: 0 % (ref 0.0–0.2)

## 2023-11-20 MED ORDER — CYANOCOBALAMIN 1000 MCG/ML IJ SOLN
1000.0000 ug | Freq: Once | INTRAMUSCULAR | Status: AC
  Administered 2023-11-20: 1000 ug via INTRAMUSCULAR
  Filled 2023-11-20: qty 1

## 2023-11-20 MED ORDER — EPOETIN ALFA 40000 UNIT/ML IJ SOLN
40000.0000 [IU] | Freq: Once | INTRAMUSCULAR | Status: AC
  Administered 2023-11-20: 40000 [IU] via SUBCUTANEOUS
  Filled 2023-11-20: qty 1

## 2023-11-20 NOTE — Progress Notes (Signed)
Hemoglobin today is 9.2. We will proceed with Retacrit and Vitamin B12 injections per provider orders.  Patient tolerated injections with no complaints voiced.  Site clean and dry with no bruising or swelling noted.  No complaints of pain.  Discharged with vital signs stable and no signs or symptoms of distress noted.

## 2023-11-20 NOTE — Patient Instructions (Signed)
[facility] - A DEPT OF [facility]  Discharge Instructions: Thank you for choosing [facility] to provide your oncology and hematology care.  If you have a lab appointment with the Cancer Center - please note that after April 8th, 2024, all labs will be drawn in the cancer center.  You do not have to check in or register with the main entrance as you have in the past but will complete your check-in in the cancer center.  Wear comfortable clothing and clothing appropriate for easy access to any Portacath or PICC line.   We strive to give you quality time with your provider. You may need to reschedule your appointment if you arrive late (15 or more minutes).  Arriving late affects you and other patients whose appointments are after yours.  Also, if you miss three or more appointments without notifying the office, you may be dismissed from the clinic at the provider's discretion.      For prescription refill requests, have your pharmacy contact our office and allow 72 hours for refills to be completed.    Today you received the following:  Retacrit/Vitamin B12.  Epoetin Alfa Injection What is this medication? EPOETIN ALFA (e POE e tin AL fa) treats low levels of red blood cells (anemia) caused by kidney disease, chemotherapy, or HIV medications. It can also be used in people who are at risk for blood loss during surgery. It works by Systems analyst make more red blood cells, which reduces the need for blood transfusions. This medicine may be used for other purposes; ask your health care provider or pharmacist if you have questions. COMMON BRAND NAME(S): Epogen, Procrit, Retacrit What should I tell my care team before I take this medication? They need to know if you have any of these conditions: Blood clots Cancer Heart disease High blood pressure On dialysis Seizures Stroke An unusual or allergic reaction to epoetin alfa, albumin, benzyl  alcohol, other medications, foods, dyes, or preservatives Pregnant or trying to get pregnant Breast-feeding How should I use this medication? This medication is injected into a vein or under the skin. It is usually given by your care team in a hospital or clinic setting. It may also be given at home. If you get this medication at home, you will be taught how to prepare and give it. Use exactly as directed. Take it as directed on the prescription label at the same time every day. Keep taking it unless your care team tells you to stop. It is important that you put your used needles and syringes in a special sharps container. Do not put them in a trash can. If you do not have a sharps container, call your pharmacist or care team to get one. A special MedGuide will be given to you by the pharmacist with each prescription and refill. Be sure to read this information carefully each time. Talk to your care team about the use of this medication in children. While this medication may be used in children as young as 1 month of age for selected conditions, precautions do apply. Overdosage: If you think you have taken too much of this medicine contact a poison control center or emergency room at once. NOTE: This medicine is only for you. Do not share this medicine with others. What if I miss a dose? If you miss a dose, take it as soon as you can. If it is almost time for your  next dose, take only that dose. Do not take double or extra doses. What may interact with this medication? Darbepoetin alfa Methoxy polyethylene glycol-epoetin beta This list may not describe all possible interactions. Give your health care provider a list of all the medicines, herbs, non-prescription drugs, or dietary supplements you use. Also tell them if you smoke, drink alcohol, or use illegal drugs. Some items may interact with your medicine. What should I watch for while using this medication? Visit your care team for regular checks  on your progress. Check your blood pressure as directed. Know what your blood pressure should be and when to contact your care team. Your condition will be monitored carefully while you are receiving this medication. You may need blood work while taking this medication. What side effects may I notice from receiving this medication? Side effects that you should report to your care team as soon as possible: Allergic reactions--skin rash, itching, hives, swelling of the face, lips, tongue, or throat Blood clot--pain, swelling, or warmth in the leg, shortness of breath, chest pain Heart attack--pain or tightness in the chest, shoulders, arms, or jaw, nausea, shortness of breath, cold or clammy skin, feeling faint or lightheaded Increase in blood pressure Rash, fever, and swollen lymph nodes Redness, blistering, peeling, or loosening of the skin, including inside the mouth Seizures Stroke--sudden numbness or weakness of the face, arm, or leg, trouble speaking, confusion, trouble walking, loss of balance or coordination, dizziness, severe headache, change in vision Side effects that usually do not require medical attention (report to your care team if they continue or are bothersome): Bone, joint, or muscle pain Cough Headache Nausea Pain, redness, or irritation at injection site This list may not describe all possible side effects. Call your doctor for medical advice about side effects. You may report side effects to FDA at 1-800-FDA-1088. Where should I keep my medication? Keep out of the reach of children and pets. Store in a refrigerator. Do not freeze. Do not shake. Protect from light. Keep this medication in the original container until you are ready to take it. See product for storage information. Get rid of any unused medication after the expiration date. To get rid of medications that are no longer needed or have expired: Take the medication to a medication take-back program. Check with  your pharmacy or law enforcement to find a location. If you cannot return the medication, ask your pharmacist or care team how to get rid of the medication safely. NOTE: This sheet is a summary. It may not cover all possible information. If you have questions about this medicine, talk to your doctor, pharmacist, or health care provider.  2024 Elsevier/Gold Standard (2022-04-04 00:00:00)    Vitamin B12 Injection What is this medication? Vitamin B12 (VAHY tuh min B12) prevents and treats low vitamin B12 levels in your body. It is used in people who do not get enough vitamin B12 from their diet or when their digestive tract does not absorb enough. Vitamin B12 plays an important role in maintaining the health of your nervous system and red blood cells. This medicine may be used for other purposes; ask your health care provider or pharmacist if you have questions. COMMON BRAND NAME(S): B-12 Compliance Kit, B-12 Injection Kit, Cyomin, Dodex, LA-12, Nutri-Twelve, Physicians EZ Use B-12, Primabalt, Vitamin Deficiency Injectable System - B12 What should I tell my care team before I take this medication? They need to know if you have any of these conditions: Kidney disease Leber's disease Megaloblastic  anemia An unusual or allergic reaction to cyanocobalamin, cobalt, other medications, foods, dyes, or preservatives Pregnant or trying to get pregnant Breast-feeding How should I use this medication? This medication is injected into a muscle or deeply under the skin. It is usually given in a clinic or care team's office. However, your care team may teach you how to inject yourself. Follow all instructions. Talk to your care team about the use of this medication in children. Special care may be needed. Overdosage: If you think you have taken too much of this medicine contact a poison control center or emergency room at once. NOTE: This medicine is only for you. Do not share this medicine with  others. What if I miss a dose? If you are given your dose at a clinic or care team's office, call to reschedule your appointment. If you give your own injections, and you miss a dose, take it as soon as you can. If it is almost time for your next dose, take only that dose. Do not take double or extra doses. What may interact with this medication? Alcohol Colchicine This list may not describe all possible interactions. Give your health care provider a list of all the medicines, herbs, non-prescription drugs, or dietary supplements you use. Also tell them if you smoke, drink alcohol, or use illegal drugs. Some items may interact with your medicine. What should I watch for while using this medication? Visit your care team regularly. You may need blood work done while you are taking this medication. You may need to follow a special diet. Talk to your care team. Limit your alcohol intake and avoid smoking to get the best benefit. What side effects may I notice from receiving this medication? Side effects that you should report to your care team as soon as possible: Allergic reactions--skin rash, itching, hives, swelling of the face, lips, tongue, or throat Swelling of the ankles, hands, or feet Trouble breathing Side effects that usually do not require medical attention (report to your care team if they continue or are bothersome): Diarrhea This list may not describe all possible side effects. Call your doctor for medical advice about side effects. You may report side effects to FDA at 1-800-FDA-1088. Where should I keep my medication? Keep out of the reach of children. Store at room temperature between 15 and 30 degrees C (59 and 85 degrees F). Protect from light. Throw away any unused medication after the expiration date. NOTE: This sheet is a summary. It may not cover all possible information. If you have questions about this medicine, talk to your doctor, pharmacist, or health care provider.   2024 Elsevier/Gold Standard (2021-08-13 00:00:00)     To help prevent nausea and vomiting after your treatment, we encourage you to take your nausea medication as directed.  BELOW ARE SYMPTOMS THAT SHOULD BE REPORTED IMMEDIATELY: *FEVER GREATER THAN 100.4 F (38 C) OR HIGHER *CHILLS OR SWEATING *NAUSEA AND VOMITING THAT IS NOT CONTROLLED WITH YOUR NAUSEA MEDICATION *UNUSUAL SHORTNESS OF BREATH *UNUSUAL BRUISING OR BLEEDING *URINARY PROBLEMS (pain or burning when urinating, or frequent urination) *BOWEL PROBLEMS (unusual diarrhea, constipation, pain near the anus) TENDERNESS IN MOUTH AND THROAT WITH OR WITHOUT PRESENCE OF ULCERS (sore throat, sores in mouth, or a toothache) UNUSUAL RASH, SWELLING OR PAIN  UNUSUAL VAGINAL DISCHARGE OR ITCHING   Items with * indicate a potential emergency and should be followed up as soon as possible or go to the Emergency Department if any problems should occur.  Please  show the CHEMOTHERAPY ALERT CARD or IMMUNOTHERAPY ALERT CARD at check-in to the Emergency Department and triage nurse.  Should you have questions after your visit or need to cancel or reschedule your appointment, please contact [facility] [place] - A DEPT OF [facility] [phone]  and follow the prompts.  Office hours are 8:00 a.m. to 4:30 p.m. Monday - Friday. Please note that voicemails left after 4:00 p.m. may not be returned until the following business day.  We are closed weekends and major holidays. You have access to a nurse at all times for urgent questions. Please call the main number to the clinic [phone] and follow the prompts.  For any non-urgent questions, you may also contact your provider using MyChart. We now offer e-Visits for anyone 27 and older to request care online for non-urgent symptoms. For details visit [email or web address].   Also download the MyChart app! Go to the app store, search "MyChart", open the app, select [place], and  log in with your MyChart username and password.

## 2023-12-03 ENCOUNTER — Ambulatory Visit (INDEPENDENT_AMBULATORY_CARE_PROVIDER_SITE_OTHER): Payer: Medicare Other | Admitting: Gastroenterology

## 2023-12-11 ENCOUNTER — Inpatient Hospital Stay: Payer: Medicare Other

## 2023-12-31 ENCOUNTER — Other Ambulatory Visit: Payer: Self-pay

## 2023-12-31 DIAGNOSIS — D500 Iron deficiency anemia secondary to blood loss (chronic): Secondary | ICD-10-CM

## 2023-12-31 DIAGNOSIS — D649 Anemia, unspecified: Secondary | ICD-10-CM

## 2023-12-31 DIAGNOSIS — N1831 Chronic kidney disease, stage 3a: Secondary | ICD-10-CM

## 2023-12-31 DIAGNOSIS — E538 Deficiency of other specified B group vitamins: Secondary | ICD-10-CM

## 2024-01-01 ENCOUNTER — Inpatient Hospital Stay: Payer: Medicare Other | Attending: Hematology

## 2024-01-01 ENCOUNTER — Inpatient Hospital Stay: Payer: Medicare Other

## 2024-01-01 VITALS — BP 131/63 | HR 68 | Temp 98.0°F | Resp 20

## 2024-01-01 DIAGNOSIS — E875 Hyperkalemia: Secondary | ICD-10-CM | POA: Diagnosis not present

## 2024-01-01 DIAGNOSIS — N1831 Chronic kidney disease, stage 3a: Secondary | ICD-10-CM

## 2024-01-01 DIAGNOSIS — D649 Anemia, unspecified: Secondary | ICD-10-CM

## 2024-01-01 DIAGNOSIS — E538 Deficiency of other specified B group vitamins: Secondary | ICD-10-CM | POA: Diagnosis not present

## 2024-01-01 DIAGNOSIS — E611 Iron deficiency: Secondary | ICD-10-CM | POA: Insufficient documentation

## 2024-01-01 DIAGNOSIS — D500 Iron deficiency anemia secondary to blood loss (chronic): Secondary | ICD-10-CM

## 2024-01-01 DIAGNOSIS — N179 Acute kidney failure, unspecified: Secondary | ICD-10-CM | POA: Diagnosis not present

## 2024-01-01 DIAGNOSIS — N183 Chronic kidney disease, stage 3 unspecified: Secondary | ICD-10-CM

## 2024-01-01 DIAGNOSIS — D631 Anemia in chronic kidney disease: Secondary | ICD-10-CM | POA: Insufficient documentation

## 2024-01-01 DIAGNOSIS — Z87891 Personal history of nicotine dependence: Secondary | ICD-10-CM | POA: Insufficient documentation

## 2024-01-01 LAB — CBC WITH DIFFERENTIAL/PLATELET
Abs Immature Granulocytes: 0.01 10*3/uL (ref 0.00–0.07)
Basophils Absolute: 0.1 10*3/uL (ref 0.0–0.1)
Basophils Relative: 1 %
Eosinophils Absolute: 0.2 10*3/uL (ref 0.0–0.5)
Eosinophils Relative: 3 %
HCT: 28.6 % — ABNORMAL LOW (ref 39.0–52.0)
Hemoglobin: 9 g/dL — ABNORMAL LOW (ref 13.0–17.0)
Immature Granulocytes: 0 %
Lymphocytes Relative: 33 %
Lymphs Abs: 1.7 10*3/uL (ref 0.7–4.0)
MCH: 29.1 pg (ref 26.0–34.0)
MCHC: 31.5 g/dL (ref 30.0–36.0)
MCV: 92.6 fL (ref 80.0–100.0)
Monocytes Absolute: 0.6 10*3/uL (ref 0.1–1.0)
Monocytes Relative: 11 %
Neutro Abs: 2.7 10*3/uL (ref 1.7–7.7)
Neutrophils Relative %: 52 %
Platelets: 274 10*3/uL (ref 150–400)
RBC: 3.09 MIL/uL — ABNORMAL LOW (ref 4.22–5.81)
RDW: 17.2 % — ABNORMAL HIGH (ref 11.5–15.5)
WBC: 5.1 10*3/uL (ref 4.0–10.5)
nRBC: 0 % (ref 0.0–0.2)

## 2024-01-01 LAB — VITAMIN B12: Vitamin B-12: 383 pg/mL (ref 180–914)

## 2024-01-01 LAB — COMPREHENSIVE METABOLIC PANEL
ALT: 8 U/L (ref 0–44)
AST: 13 U/L — ABNORMAL LOW (ref 15–41)
Albumin: 3.7 g/dL (ref 3.5–5.0)
Alkaline Phosphatase: 51 U/L (ref 38–126)
Anion gap: 5 (ref 5–15)
BUN: 22 mg/dL (ref 8–23)
CO2: 22 mmol/L (ref 22–32)
Calcium: 9.2 mg/dL (ref 8.9–10.3)
Chloride: 107 mmol/L (ref 98–111)
Creatinine, Ser: 1.21 mg/dL (ref 0.61–1.24)
GFR, Estimated: >60 mL/min (ref >60)
Glucose, Bld: 98 mg/dL (ref 70–99)
Potassium: 5.6 mmol/L — ABNORMAL HIGH (ref 3.5–5.1)
Sodium: 134 mmol/L — ABNORMAL LOW (ref 135–145)
Total Bilirubin: 0.5 mg/dL (ref 0.0–1.2)
Total Protein: 7 g/dL (ref 6.5–8.1)

## 2024-01-01 LAB — FERRITIN: Ferritin: 317 ng/mL (ref 24–336)

## 2024-01-01 LAB — IRON AND TIBC
Iron: 35 ug/dL — ABNORMAL LOW (ref 45–182)
Saturation Ratios: 12 % — ABNORMAL LOW (ref 17.9–39.5)
TIBC: 300 ug/dL (ref 250–450)
UIBC: 265 ug/dL

## 2024-01-01 MED ORDER — EPOETIN ALFA 40000 UNIT/ML IJ SOLN
40000.0000 [IU] | Freq: Once | INTRAMUSCULAR | Status: AC
  Administered 2024-01-01: 40000 [IU] via SUBCUTANEOUS
  Filled 2024-01-01: qty 1

## 2024-01-01 MED ORDER — CYANOCOBALAMIN 1000 MCG/ML IJ SOLN
1000.0000 ug | Freq: Once | INTRAMUSCULAR | Status: AC
  Administered 2024-01-01: 1000 ug via INTRAMUSCULAR
  Filled 2024-01-01: qty 1

## 2024-01-01 NOTE — Addendum Note (Signed)
Addended by: Bertrum Sol on: 01/01/2024 10:19 AM   Modules accepted: Orders

## 2024-01-01 NOTE — Patient Instructions (Signed)

## 2024-01-01 NOTE — Progress Notes (Signed)
Patient's K-5.6. Boneta Lucks NP notified. Per Boneta Lucks NP to hold potassium supplements until next week when he sees MD and potassium will be rechecked next week prior to MD appointment. Treatment team and pt made aware. RN educated pt on hyperkalemia signs and symptoms and on the importance of when to seek emergency care if complications were to occur, pt verbalized understanding and all questions answered at this time. Pt stable.    Patient's hgb 9.0 and blood pressure stable. tolerated Retacrit and B12  injection with no complaints voiced.  Site clean and dry with no bruising or swelling noted at site.  See MAR for details.  Band aid applied.  Patient stable during and after injection.  Vss with discharge and left in satisfactory condition with no s/s of distress noted. All follow ups as scheduled.   Sabeen Piechocki [employer]

## 2024-01-03 LAB — METHYLMALONIC ACID, SERUM: Methylmalonic Acid, Quantitative: 559 nmol/L — ABNORMAL HIGH (ref 0–378)

## 2024-01-07 ENCOUNTER — Inpatient Hospital Stay: Payer: Medicare Other

## 2024-01-07 ENCOUNTER — Inpatient Hospital Stay: Payer: Medicare Other | Admitting: Oncology

## 2024-01-07 ENCOUNTER — Encounter: Payer: Self-pay | Admitting: Oncology

## 2024-01-07 VITALS — BP 134/74 | HR 73 | Temp 98.7°F | Resp 20 | Wt 179.9 lb

## 2024-01-07 DIAGNOSIS — D500 Iron deficiency anemia secondary to blood loss (chronic): Secondary | ICD-10-CM

## 2024-01-07 DIAGNOSIS — E875 Hyperkalemia: Secondary | ICD-10-CM | POA: Diagnosis not present

## 2024-01-07 DIAGNOSIS — D696 Thrombocytopenia, unspecified: Secondary | ICD-10-CM

## 2024-01-07 DIAGNOSIS — D649 Anemia, unspecified: Secondary | ICD-10-CM

## 2024-01-07 DIAGNOSIS — D631 Anemia in chronic kidney disease: Secondary | ICD-10-CM | POA: Diagnosis not present

## 2024-01-07 DIAGNOSIS — N179 Acute kidney failure, unspecified: Secondary | ICD-10-CM | POA: Diagnosis not present

## 2024-01-07 DIAGNOSIS — N183 Chronic kidney disease, stage 3 unspecified: Secondary | ICD-10-CM

## 2024-01-07 DIAGNOSIS — E538 Deficiency of other specified B group vitamins: Secondary | ICD-10-CM | POA: Diagnosis not present

## 2024-01-07 DIAGNOSIS — N1831 Chronic kidney disease, stage 3a: Secondary | ICD-10-CM | POA: Diagnosis not present

## 2024-01-07 LAB — CBC
HCT: 29.9 % — ABNORMAL LOW (ref 39.0–52.0)
Hemoglobin: 9.1 g/dL — ABNORMAL LOW (ref 13.0–17.0)
MCH: 28.2 pg (ref 26.0–34.0)
MCHC: 30.4 g/dL (ref 30.0–36.0)
MCV: 92.6 fL (ref 80.0–100.0)
Platelets: 278 10*3/uL (ref 150–400)
RBC: 3.23 MIL/uL — ABNORMAL LOW (ref 4.22–5.81)
RDW: 17.5 % — ABNORMAL HIGH (ref 11.5–15.5)
WBC: 4.7 10*3/uL (ref 4.0–10.5)
nRBC: 0 % (ref 0.0–0.2)

## 2024-01-07 LAB — POTASSIUM: Potassium: 5.4 mmol/L — ABNORMAL HIGH (ref 3.5–5.1)

## 2024-01-07 NOTE — Progress Notes (Signed)
[facility] [address], [place] [address]   CLINIC:  Medical Oncology/Hematology  PCP:  Juliette Alcide, MD [address] [place] [address] [phone]   REASON FOR VISIT:  Follow-up for anemia of CKD and iron deficiency   PRIOR THERAPY: None   CURRENT THERAPY: Retacrit injections weekly; intermittent parenteral iron therapy; oral iron supplement  INTERVAL HISTORY:   Roger Hansen 78 y.o. male returns for routine follow-up of his normocytic anemia.  This he was last seen in clinic by me on 10/08/2023.  He denies any interval hospitalizations.  Tolerating Retacrit well.   He has been receiving Retacrit every 3 weeks and tolerating well. Hemoglobin stable around 9.5-10.  Reports he continues to have intermittent constipation due to iron supplements.  He takes stool softeners which help.  Denies any obvious bleeding although his stools are dark.  He has occasional shortness of breath especially with exertion.  Has fairly consistent abdominal discomfort although he is unsure of what it is related to.  Reports it comes and goes.  He has some sleep problems and underlying depression.  Has occasional dizziness and headaches but these are chronic and stable.  No new medications.  He has held his potassium supplement since last week due to it being elevated.  Reports this has happened in the past and his PCP has had him do the same thing.  ASSESSMENT & PLAN:  1.   Normocytic anemia - His anemia dates back to at least 2014 with a negative peripheral work-up, negative bone marrow aspiration and biopsy and cytogenetics on 10/16/2016 (obtained in the setting of heavy alcohol use). The bone marrow showed slightly hypercellular marrow with trilineage hematopoiesis. Chromosome analysis was normal. - MGUS/myeloma panel (09/20/2021) was negative.  Hemolysis work-up was negative.   -Etiology thought to be secondary to CKD, functional iron deficiency and anemia from chronic  disease. Occult GI blood loss possible - he does have intermittent drops in his Hgb and presence of nonbleeding AVM in colon on most recent colonoscopy but without associated drops in iron Patient reports that he has been anemic ever since [place] and had significant agent orange exposure, which has been associated with aplastic anemia and other conditions. Would also consider possible early MDS (bone marrow biopsy in 2017 showed subtle dyspoietic changes that were nondiagnostic and nonspecific at that time) Myelosuppression from alcohol consumption.  Prior history of heavy alcohol use (from 2007-2017), but has lately been drinking about a fifth of liquor each week. - He has been receiving intermittent Epogen and parenteral iron therapy since June 2020. - He is a Jehovah witness and does not receive blood products. - EGD (11/20/2022): Erythematous gastric mucosa, duodenitis - Colonoscopy (11/20/2022): Single nonbleeding angiodysplastic lesion treated with APC, polyps x 4 (pathology benign), diverticulosis   2.  B12 deficiency - Patient was started on monthly B12 injections in November 2022 - B12 normal at 426 with normal MMA as of 10/10/2022 -B12 level on 06/16/2023 was 355 and MMA is 251.  3.  CKD stage II with intermittent AKI -She is followed by nephrology. -Kidney function appears stable.  PLAN: 1. Iron deficiency anemia due to chronic blood loss (Primary) - He continues to take iron tablets 325 mg once daily.    - He has required intermittent IV iron.  - He reports intermittent black bowel movements.  He denies any bright red blood per rectum, hematemesis, or epistaxis.     - Labs from 01/01/2024 show hemoglobin  of 9.0/MCV 28.6.  Iron saturations are 12% and a ferritin of 317. -We discussed giving him 1 dose of IV Venofer given iron saturations dropped.  He was agreeable.  Will get him scheduled for 300 mg IV Venofer. -We will recheck his labs in approximately 12 weeks. -We discussed if he  continues to have worsening anemia, repeat bone marrow biopsy would be reasonable.  2. Anemia in stage 3a chronic kidney disease (HCC) -She receives 40,000 units of Retacrit every 3 weeks. -Hemoglobin has ranged between 9 and 10 over the past few months. -Recommend we continue Retacrit every 3 weeks.  3. B12 deficiency -Labs from 01/01/2024 show an MMA of 559 and B12 level of 383. -Continue every 3-week B12 injections along with his Retacrit.  4. Hyperkalemia -He is on 20 mill equivalents potassium supplements daily prescribed by PCP.  -Potassium level last week was 5.6.  We asked him to hold his potassium supplement and recheck labs today. -Recheck of his potassium level today is 5.4. -Continue to hold potassium supplements and will recheck in 2 weeks.   PLAN SUMMARY: >> Recommend 1 dose of IV Venofer 300 mg over the next week or so. >>CBC + Retacrit every 3 weeks. >> B12 injections every 3 weeks. >> Labs in 3 months (CBC/D, CMP, ferritin, iron/TIBC, B12, MMA) >> Office visit in 3 months (at least 1 week after labs)     REVIEW OF SYSTEMS:   Review of Systems  Respiratory:  Positive for chest tightness.   Musculoskeletal:  Positive for back pain.  Neurological:  Positive for dizziness, headaches and numbness.  Psychiatric/Behavioral:  Positive for depression. The patient is nervous/anxious.      PHYSICAL EXAM:  ECOG PERFORMANCE STATUS: 1 - Symptomatic but completely ambulatory  Vitals:   01/07/24 0913  BP: 134/74  Pulse: 73  Resp: 20  Temp: 98.7 F (37.1 C)  SpO2: 100%    Filed Weights   01/07/24 0913  Weight: 179 lb 14.3 oz (81.6 kg)    Physical Exam Constitutional:      General: He is not in acute distress.    Appearance: Normal appearance.  Cardiovascular:     Rate and Rhythm: Normal rate and regular rhythm.  Pulmonary:     Effort: Pulmonary effort is normal.     Breath sounds: Normal breath sounds.  Abdominal:     General: Bowel sounds are normal.      Palpations: Abdomen is soft.  Musculoskeletal:        General: No swelling. Normal range of motion.  Neurological:     Mental Status: He is alert and oriented to person, place, and time. Mental status is at baseline.     PAST MEDICAL/SURGICAL HISTORY:  Past Medical History:  Diagnosis Date   Alcohol abuse 11/24/2016   Anemia 10/11/2013   Cardiomyopathy (HCC)    a. EF 50% by echo in 2019 with NST showing no reversible ischemia and thought to be alcohol-induced   Chronic renal disease, stage 3, moderately decreased glomerular filtration rate (GFR) between 30-59 mL/min/1.73 square meter (HCC) 12/02/2016   Colon polyps    GERD (gastroesophageal reflux disease)    High cholesterol    Hypertension    Sinus infection    Past Surgical History:  Procedure Laterality Date   BIOPSY  06/03/2019   Procedure: BIOPSY;  Surgeon: Malissa Hippo, MD;  [facility];  Service: Endoscopy;;  esophagus   BIOPSY N/A 11/20/2022   Procedure: RANDOM BIOPSIES;  Surgeon: Levon Hedger  Alisia Ferrari, MD;  [facility]: [facility];  Service: Gastroenterology;  Laterality: N/A;   COLONOSCOPY N/A 10/26/2013   Procedure: COLONOSCOPY;  Surgeon: Malissa Hippo, MD;  [facility];  Service: Endoscopy;  Laterality: N/A;  325-moved to 1230 Ann to notify pt   Colonoscopy with polypectomy     COLONOSCOPY WITH PROPOFOL N/A 06/03/2019   Procedure: COLONOSCOPY WITH PROPOFOL;  Surgeon: Malissa Hippo, MD;  [facility];  Service: Endoscopy;  Laterality: N/A;   COLONOSCOPY WITH PROPOFOL N/A 11/20/2022   Procedure: COLONOSCOPY WITH PROPOFOL;  Surgeon: Dolores Frame, MD;  [facility];  Service: Gastroenterology;  Laterality: N/A;  730 ASA 3   ESOPHAGEAL DILATION N/A 11/20/2022   Procedure: ESOPHAGEAL DILATION;  Surgeon: Dolores Frame, MD;  [facility];  Service: Gastroenterology;  Laterality: N/A;   ESOPHAGOGASTRODUODENOSCOPY (EGD) WITH PROPOFOL N/A  06/03/2019   Procedure: ESOPHAGOGASTRODUODENOSCOPY (EGD) WITH PROPOFOL;  Surgeon: Malissa Hippo, MD;  [facility];  Service: Endoscopy;  Laterality: N/A;   ESOPHAGOGASTRODUODENOSCOPY (EGD) WITH PROPOFOL N/A 11/20/2022   Procedure: ESOPHAGOGASTRODUODENOSCOPY (EGD) WITH PROPOFOL;  Surgeon: Dolores Frame, MD;  [facility];  Service: Gastroenterology;  Laterality: N/A;   HOT HEMOSTASIS  11/20/2022   Procedure: HOT HEMOSTASIS (ARGON PLASMA COAGULATION/BICAP);  Surgeon: Marguerita Merles, Reuel Boom, MD;  [facility];  Service: Gastroenterology;;   POLYPECTOMY  06/03/2019   Procedure: POLYPECTOMY;  Surgeon: Malissa Hippo, MD;  [facility];  Service: Endoscopy;;  colon   POLYPECTOMY  11/20/2022   Procedure: POLYPECTOMY;  Surgeon: Dolores Frame, MD;  [facility];  Service: Gastroenterology;;    SOCIAL HISTORY:  Social History   Socioeconomic History   Marital status: Married    Spouse name: Not on file   Number of children: Not on file   Years of education: Not on file   Highest education level: Not on file  Occupational History   Not on file  Tobacco Use   Smoking status: Former    Current packs/day: 0.00    Average packs/day: 0.5 packs/day for 20.0 years (10.0 ttl pk-yrs)    Types: Cigarettes    Start date: 12/27/1951    Quit date: 12/27/1971    Years since quitting: 52.0    Passive exposure: Past   Smokeless tobacco: Never  Vaping Use   Vaping status: Never Used  Substance and Sexual Activity   Alcohol use: Yes    Comment: 1 pint of etoh a day ( not recently)   Drug use: No   Sexual activity: Not on file  Other Topics Concern   Not on file  Social History Narrative   Not on file   Social Drivers of Health   Financial Resource Strain: Not on file  Food Insecurity: No Food Insecurity (11/19/2020)   Hunger Vital Sign    Worried About Running Out of Food in the Last Year: Never true    Ran Out of  Food in the Last Year: Never true  Transportation Needs: No Transportation Needs (11/19/2020)   PRAPARE - Administrator, Civil Service (Medical): No    Lack of Transportation (Non-Medical): No  Physical Activity: Inactive (11/19/2020)   Exercise Vital Sign    Days of Exercise per Week: 0 days    Minutes of Exercise per Session: 0 min  Stress: No Stress Concern Present (11/19/2020)   [employer] of Occupational Health - Occupational Stress Questionnaire  Feeling of Stress : Not at all  Social Connections: Not on file  Intimate Partner Violence: Not At Risk (11/19/2020)   Humiliation, Afraid, Rape, and Kick questionnaire    Fear of Current or Ex-Partner: No    Emotionally Abused: No    Physically Abused: No    Sexually Abused: No    FAMILY HISTORY:  Family History  Problem Relation Age of Onset   Hypertension Mother    Colon cancer Neg Hx     CURRENT MEDICATIONS:  Outpatient Encounter Medications as of 01/07/2024  Medication Sig Note   ALPRAZolam (XANAX) 1 MG tablet Take 1 mg by mouth daily.    aspirin 81 MG tablet Take 1 tablet (81 mg total) by mouth daily.    atorvastatin (LIPITOR) 10 MG tablet Take 10 mg by mouth every morning.    buPROPion (WELLBUTRIN XL) 150 MG 24 hr tablet Take 450 mg by mouth every morning.    cholecalciferol (VITAMIN D) 1000 UNITS tablet Take 1,000 Units by mouth 2 (two) times daily.    docusate sodium (STOOL SOFTENER) 100 MG capsule Take 100 mg by mouth every morning.    donepezil (ARICEPT) 10 MG tablet Take 10 mg by mouth daily with breakfast.    Ferrous Sulfate (IRON) 325 (65 Fe) MG TABS Take 1 tablet (325 mg total) by mouth 2 (two) times daily. 03/03/2023: One daily   folic acid (FOLVITE) 1 MG tablet Take 1 mg by mouth daily.    furosemide (LASIX) 20 MG tablet Take 20 mg by mouth daily as needed for fluid or edema.    gabapentin (NEURONTIN) 300 MG capsule Take 300 mg by mouth 3 (three) times daily.    GNP VITAMIN B-1 100 MG tablet  Take 100 mg by mouth daily. Thiamine    losartan (COZAAR) 50 MG tablet Take 50 mg by mouth daily.    magnesium oxide (MAG-OX) 400 MG tablet Take 400 mg by mouth 2 (two) times daily.    metoprolol succinate (TOPROL XL) 25 MG 24 hr tablet Take 1 tablet (25 mg total) by mouth daily.    nitroGLYCERIN (NITROSTAT) 0.4 MG SL tablet Place 1 tablet (0.4 mg total) under the tongue every 5 (five) minutes as needed for chest pain.    pantoprazole (PROTONIX) 40 MG tablet Take 1 tablet (40 mg total) by mouth 2 (two) times daily.    potassium chloride SA (K-DUR) 20 MEQ tablet Take 20 mEq by mouth daily.     QUEtiapine (SEROQUEL) 200 MG tablet Take 200 mg by mouth at bedtime.    tamsulosin (FLOMAX) 0.4 MG CAPS capsule Take 0.4 mg by mouth daily as needed (Urine problems).    traZODone (DESYREL) 100 MG tablet Take 200 mg by mouth at bedtime.    No facility-administered encounter medications on file as of 01/07/2024.    ALLERGIES:  Allergies  Allergen Reactions   Horseradish [Armoracia Rusticana Ext (Horseradish)] Anaphylaxis    LABORATORY DATA:  I have reviewed the labs as listed.  CBC    Component Value Date/Time   WBC 5.1 01/01/2024 0908   RBC 3.09 (L) 01/01/2024 0908   HGB 9.0 (L) 01/01/2024 0908   HCT 28.6 (L) 01/01/2024 0908   HCT 27.9 (L) 08/13/2016 0944   PLT 274 01/01/2024 0908   MCV 92.6 01/01/2024 0908   MCH 29.1 01/01/2024 0908   MCHC 31.5 01/01/2024 0908   RDW 17.2 (H) 01/01/2024 0908   LYMPHSABS 1.7 01/01/2024 0908   MONOABS 0.6 01/01/2024 0908  EOSABS 0.2 01/01/2024 0908   BASOSABS 0.1 01/01/2024 0908      Latest Ref Rng & Units 01/01/2024    9:08 AM 06/16/2023   10:06 AM 03/12/2023    8:16 AM  CMP  Glucose 70 - 99 mg/dL 98  086  578   BUN 8 - 23 mg/dL 22  12  11    Creatinine 0.61 - 1.24 mg/dL 4.69  6.29  5.28   Sodium 135 - 145 mmol/L 134  137  135   Potassium 3.5 - 5.1 mmol/L 5.6  3.8  3.6   Chloride 98 - 111 mmol/L 107  102  94   CO2 22 - 32 mmol/L 22  27  30     Calcium 8.9 - 10.3 mg/dL 9.2  9.1  9.3   Total Protein 6.5 - 8.1 g/dL 7.0  6.7  7.0   Total Bilirubin 0.0 - 1.2 mg/dL 0.5  1.5  1.1   Alkaline Phos 38 - 126 U/L 51  63  76   AST 15 - 41 U/L 13  21  43   ALT 0 - 44 U/L 8  16  17      DIAGNOSTIC IMAGING:  I have independently reviewed the relevant imaging and discussed with the patient.   WRAP UP:  All questions were answered. The patient knows to call the clinic with any problems, questions or concerns.  Medical decision making: Moderate  Time spent on visit: I spent 25 minutes dedicated to the care of this patient (face-to-face and non-face-to-face) on the date of the encounter to include what is described in the assessment and plan.  Durenda Hurt, NP 01/07/2024 10:25 AM

## 2024-01-13 ENCOUNTER — Inpatient Hospital Stay: Payer: Medicare Other

## 2024-01-13 VITALS — BP 116/55 | HR 62 | Temp 97.6°F | Resp 18 | Ht 65.0 in | Wt 183.4 lb

## 2024-01-13 DIAGNOSIS — E875 Hyperkalemia: Secondary | ICD-10-CM | POA: Diagnosis not present

## 2024-01-13 DIAGNOSIS — N183 Chronic kidney disease, stage 3 unspecified: Secondary | ICD-10-CM

## 2024-01-13 DIAGNOSIS — N179 Acute kidney failure, unspecified: Secondary | ICD-10-CM | POA: Diagnosis not present

## 2024-01-13 DIAGNOSIS — D500 Iron deficiency anemia secondary to blood loss (chronic): Secondary | ICD-10-CM | POA: Diagnosis not present

## 2024-01-13 DIAGNOSIS — E538 Deficiency of other specified B group vitamins: Secondary | ICD-10-CM | POA: Diagnosis not present

## 2024-01-13 DIAGNOSIS — D649 Anemia, unspecified: Secondary | ICD-10-CM

## 2024-01-13 DIAGNOSIS — N1831 Chronic kidney disease, stage 3a: Secondary | ICD-10-CM | POA: Diagnosis not present

## 2024-01-13 DIAGNOSIS — D631 Anemia in chronic kidney disease: Secondary | ICD-10-CM | POA: Diagnosis not present

## 2024-01-13 MED ORDER — ACETAMINOPHEN 325 MG PO TABS
650.0000 mg | ORAL_TABLET | Freq: Once | ORAL | Status: AC
Start: 2024-01-13 — End: 2024-01-13
  Administered 2024-01-13: 650 mg via ORAL
  Filled 2024-01-13: qty 2

## 2024-01-13 MED ORDER — SODIUM CHLORIDE 0.9 % IV SOLN: INTRAVENOUS | Status: DC

## 2024-01-13 MED ORDER — SODIUM CHLORIDE 0.9 % IV SOLN
300.0000 mg | Freq: Once | INTRAVENOUS | Status: AC
  Administered 2024-01-13: 300 mg via INTRAVENOUS
  Filled 2024-01-13: qty 300

## 2024-01-13 MED ORDER — CETIRIZINE HCL 10 MG/ML IV SOLN
10.0000 mg | Freq: Once | INTRAVENOUS | Status: AC
Start: 2024-01-13 — End: 2024-01-13
  Administered 2024-01-13: 10 mg via INTRAVENOUS
  Filled 2024-01-13: qty 1

## 2024-01-13 NOTE — Patient Instructions (Signed)

## 2024-01-13 NOTE — Progress Notes (Signed)
Patient tolerated iron infusion with no complaints voiced.  Peripheral IV site clean and dry with good blood return noted before and after infusion.  Band aid applied.  Pt did not want to wait the full 30 minutes post observation. Pt observed for 15 minutes post iron without any complications. VSS with discharge and left in satisfactory condition with no s/s of distress noted. All follow ups as scheduled.   Roger Hansen [employer]

## 2024-01-22 ENCOUNTER — Inpatient Hospital Stay: Payer: Medicare Other | Attending: Hematology

## 2024-01-22 VITALS — BP 130/57 | HR 61 | Temp 97.0°F | Resp 19

## 2024-01-22 DIAGNOSIS — D631 Anemia in chronic kidney disease: Secondary | ICD-10-CM | POA: Insufficient documentation

## 2024-01-22 DIAGNOSIS — E538 Deficiency of other specified B group vitamins: Secondary | ICD-10-CM | POA: Insufficient documentation

## 2024-01-22 DIAGNOSIS — N1831 Chronic kidney disease, stage 3a: Secondary | ICD-10-CM | POA: Diagnosis not present

## 2024-01-22 DIAGNOSIS — N183 Chronic kidney disease, stage 3 unspecified: Secondary | ICD-10-CM

## 2024-01-22 DIAGNOSIS — D500 Iron deficiency anemia secondary to blood loss (chronic): Secondary | ICD-10-CM

## 2024-01-22 DIAGNOSIS — D649 Anemia, unspecified: Secondary | ICD-10-CM

## 2024-01-22 DIAGNOSIS — E611 Iron deficiency: Secondary | ICD-10-CM | POA: Insufficient documentation

## 2024-01-22 DIAGNOSIS — D696 Thrombocytopenia, unspecified: Secondary | ICD-10-CM

## 2024-01-22 LAB — CBC
HCT: 30.9 % — ABNORMAL LOW (ref 39.0–52.0)
Hemoglobin: 9.3 g/dL — ABNORMAL LOW (ref 13.0–17.0)
MCH: 27.6 pg (ref 26.0–34.0)
MCHC: 30.1 g/dL (ref 30.0–36.0)
MCV: 91.7 fL (ref 80.0–100.0)
Platelets: 254 10*3/uL (ref 150–400)
RBC: 3.37 MIL/uL — ABNORMAL LOW (ref 4.22–5.81)
RDW: 16.5 % — ABNORMAL HIGH (ref 11.5–15.5)
WBC: 4.9 10*3/uL (ref 4.0–10.5)
nRBC: 0 % (ref 0.0–0.2)

## 2024-01-22 MED ORDER — CYANOCOBALAMIN 1000 MCG/ML IJ SOLN
1000.0000 ug | Freq: Once | INTRAMUSCULAR | Status: AC
  Administered 2024-01-22: 1000 ug via INTRAMUSCULAR
  Filled 2024-01-22: qty 1

## 2024-01-22 MED ORDER — EPOETIN ALFA 40000 UNIT/ML IJ SOLN
40000.0000 [IU] | Freq: Once | INTRAMUSCULAR | Status: AC
  Administered 2024-01-22: 40000 [IU] via SUBCUTANEOUS
  Filled 2024-01-22: qty 1

## 2024-01-22 NOTE — Progress Notes (Signed)
 Patient's hgb 9.3 and blood pressure stable. Patient tolerated B12 and Retacrit  injection with no complaints voiced.  Site clean and dry with no bruising or swelling noted at site.  See MAR for details.  Band aid applied.  Patient stable during and after injection.  Vss with discharge and left in satisfactory condition with no s/s of distress noted. All follow ups as scheduled.   Roger Hansen

## 2024-01-22 NOTE — Patient Instructions (Signed)
Vitamin B12 Injection What is this medication? Vitamin B12 (VAHY tuh min B12) prevents and treats low vitamin B12 levels in your body. It is used in people who do not get enough vitamin B12 from their diet or when their digestive tract does not absorb enough. Vitamin B12 plays an important role in maintaining the health of your nervous system and red blood cells. This medicine may be used for other purposes; ask your health care provider or pharmacist if you have questions. COMMON BRAND NAME(S): B-12 Compliance Kit, B-12 Injection Kit, Cyomin, Dodex, LA-12, Nutri-Twelve, Physicians EZ Use B-12, Primabalt, Vitamin Deficiency Injectable System - B12 What should I tell my care team before I take this medication? They need to know if you have any of these conditions: Kidney disease Leber's disease Megaloblastic anemia An unusual or allergic reaction to cyanocobalamin, cobalt, other medications, foods, dyes, or preservatives Pregnant or trying to get pregnant Breast-feeding How should I use this medication? This medication is injected into a muscle or deeply under the skin. It is usually given in a clinic or care team's office. However, your care team may teach you how to inject yourself. Follow all instructions. Talk to your care team about the use of this medication in children. Special care may be needed. Overdosage: If you think you have taken too much of this medicine contact a poison control center or emergency room at once. NOTE: This medicine is only for you. Do not share this medicine with others. What if I miss a dose? If you are given your dose at a clinic or care team's office, call to reschedule your appointment. If you give your own injections, and you miss a dose, take it as soon as you can. If it is almost time for your next dose, take only that dose. Do not take double or extra doses. What may interact with this medication? Alcohol Colchicine This list may not describe all possible  interactions. Give your health care provider a list of all the medicines, herbs, non-prescription drugs, or dietary supplements you use. Also tell them if you smoke, drink alcohol, or use illegal drugs. Some items may interact with your medicine. What should I watch for while using this medication? Visit your care team regularly. You may need blood work done while you are taking this medication. You may need to follow a special diet. Talk to your care team. Limit your alcohol intake and avoid smoking to get the best benefit. What side effects may I notice from receiving this medication? Side effects that you should report to your care team as soon as possible: Allergic reactions--skin rash, itching, hives, swelling of the face, lips, tongue, or throat Swelling of the ankles, hands, or feet Trouble breathing Side effects that usually do not require medical attention (report to your care team if they continue or are bothersome): Diarrhea This list may not describe all possible side effects. Call your doctor for medical advice about side effects. You may report side effects to FDA at 1-800-FDA-1088. Where should I keep my medication? Keep out of the reach of children. Store at room temperature between 15 and 30 degrees C (59 and 85 degrees F). Protect from light. Throw away any unused medication after the expiration date. NOTE: This sheet is a summary. It may not cover all possible information. If you have questions about this medicine, talk to your doctor, pharmacist, or health care provider.  2024 Elsevier/Gold Standard (2021-08-13 00:00:00)  Epoetin Alfa Injection What is this medication?  EPOETIN ALFA (e POE e tin AL fa) treats low levels of red blood cells (anemia) caused by kidney disease, chemotherapy, or HIV medications. It can also be used in people who are at risk for blood loss during surgery. It works by Systems analyst make more red blood cells, which reduces the need for blood  transfusions. This medicine may be used for other purposes; ask your health care provider or pharmacist if you have questions. COMMON BRAND NAME(S): Epogen, Procrit, Retacrit What should I tell my care team before I take this medication? They need to know if you have any of these conditions: Blood clots Cancer Heart disease High blood pressure On dialysis Seizures Stroke An unusual or allergic reaction to epoetin alfa, albumin, benzyl alcohol, other medications, foods, dyes, or preservatives Pregnant or trying to get pregnant Breast-feeding How should I use this medication? This medication is injected into a vein or under the skin. It is usually given by your care team in a hospital or clinic setting. It may also be given at home. If you get this medication at home, you will be taught how to prepare and give it. Use exactly as directed. Take it as directed on the prescription label at the same time every day. Keep taking it unless your care team tells you to stop. It is important that you put your used needles and syringes in a special sharps container. Do not put them in a trash can. If you do not have a sharps container, call your pharmacist or care team to get one. A special MedGuide will be given to you by the pharmacist with each prescription and refill. Be sure to read this information carefully each time. Talk to your care team about the use of this medication in children. While this medication may be used in children as young as 1 month of age for selected conditions, precautions do apply. Overdosage: If you think you have taken too much of this medicine contact a poison control center or emergency room at once. NOTE: This medicine is only for you. Do not share this medicine with others. What if I miss a dose? If you miss a dose, take it as soon as you can. If it is almost time for your next dose, take only that dose. Do not take double or extra doses. What may interact with this  medication? Darbepoetin alfa Methoxy polyethylene glycol-epoetin beta This list may not describe all possible interactions. Give your health care provider a list of all the medicines, herbs, non-prescription drugs, or dietary supplements you use. Also tell them if you smoke, drink alcohol, or use illegal drugs. Some items may interact with your medicine. What should I watch for while using this medication? Visit your care team for regular checks on your progress. Check your blood pressure as directed. Know what your blood pressure should be and when to contact your care team. Your condition will be monitored carefully while you are receiving this medication. You may need blood work while taking this medication. What side effects may I notice from receiving this medication? Side effects that you should report to your care team as soon as possible: Allergic reactions--skin rash, itching, hives, swelling of the face, lips, tongue, or throat Blood clot--pain, swelling, or warmth in the leg, shortness of breath, chest pain Heart attack--pain or tightness in the chest, shoulders, arms, or jaw, nausea, shortness of breath, cold or clammy skin, feeling faint or lightheaded Increase in blood pressure Rash, fever, and  swollen lymph nodes Redness, blistering, peeling, or loosening of the skin, including inside the mouth Seizures Stroke--sudden numbness or weakness of the face, arm, or leg, trouble speaking, confusion, trouble walking, loss of balance or coordination, dizziness, severe headache, change in vision Side effects that usually do not require medical attention (report to your care team if they continue or are bothersome): Bone, joint, or muscle pain Cough Headache Nausea Pain, redness, or irritation at injection site This list may not describe all possible side effects. Call your doctor for medical advice about side effects. You may report side effects to FDA at 1-800-FDA-1088. Where should I  keep my medication? Keep out of the reach of children and pets. Store in a refrigerator. Do not freeze. Do not shake. Protect from light. Keep this medication in the original container until you are ready to take it. See product for storage information. Get rid of any unused medication after the expiration date. To get rid of medications that are no longer needed or have expired: Take the medication to a medication take-back program. Check with your pharmacy or law enforcement to find a location. If you cannot return the medication, ask your pharmacist or care team how to get rid of the medication safely. NOTE: This sheet is a summary. It may not cover all possible information. If you have questions about this medicine, talk to your doctor, pharmacist, or health care provider.  2024 Elsevier/Gold Standard (2022-04-04 00:00:00)

## 2024-01-28 ENCOUNTER — Encounter (INDEPENDENT_AMBULATORY_CARE_PROVIDER_SITE_OTHER): Payer: Self-pay | Admitting: Gastroenterology

## 2024-01-28 ENCOUNTER — Ambulatory Visit (INDEPENDENT_AMBULATORY_CARE_PROVIDER_SITE_OTHER): Payer: Medicare Other | Admitting: Gastroenterology

## 2024-01-28 VITALS — BP 121/71 | HR 64 | Temp 97.6°F | Ht 65.0 in | Wt 190.1 lb

## 2024-01-28 DIAGNOSIS — K582 Mixed irritable bowel syndrome: Secondary | ICD-10-CM

## 2024-01-28 DIAGNOSIS — K219 Gastro-esophageal reflux disease without esophagitis: Secondary | ICD-10-CM

## 2024-01-28 DIAGNOSIS — D500 Iron deficiency anemia secondary to blood loss (chronic): Secondary | ICD-10-CM

## 2024-01-28 DIAGNOSIS — K581 Irritable bowel syndrome with constipation: Secondary | ICD-10-CM

## 2024-01-28 DIAGNOSIS — D509 Iron deficiency anemia, unspecified: Secondary | ICD-10-CM

## 2024-01-28 NOTE — Patient Instructions (Addendum)
Schedule capsule endoscopy Continue with oral iron daily, may consider increasing to twice a day dosing Continue follow-up with hematology Continue with pantoprazole 40 mg twice a day, may consider decreasing to 40/20 milligram in follow-up appointment Continue daily Benefiber

## 2024-01-28 NOTE — Progress Notes (Signed)
Katrinka Blazing, M.D. Gastroenterology & Hepatology [facility] Gastroenterology [address] [place], [place] [address]  Primary Care Physician: Juliette Alcide, MD [address] [place] [address]  I will communicate my assessment and recommendations to the referring MD via EMR.  Problems: Iron deficiency anemia IBS-C GERD  History of Present Illness: Roger Hansen is a 78 y.o. male with past medical history of alcohol abuse, anemia, cardiomyopathy, CKD stage 3, GERD, colon polyps, High cholesterol, HTN , who presents for follow up of iron deficiency anemia, changes in bowel movements and GERD.  The patient was last seen on 09/03/2023. At that time, the patient was advised to start taking Protonix 40 mg twice daily for GERD.  Also advised to continue taking Benefiber 1 tablespoon twice daily.  Also counseled about alcohol cessation.  Patient had most recent blood workup on 01/01/2024, iron was low at 35, saturation was 12%, ferritin was mildly increased 317.  Hemoglobin has been in the low 9 range, with most recent hemoglobin of 9.3 on 01/22/2024, platelets were 254 and WBC was 4.9.  Patient is currently being followed by hematology.  Has been scheduled for IV Venofer infusion which he has received 1 time.  He reports feeling some dizziness and lightheaded intermittently. Also believes having some right sided chest pain but this is not happening regularly. No SOB. He reports having black stools on a daily basis, which he believes he has had since he has been on iron.  Has not been having any heartburn recently. No dysphagia. He reports that he is taking pantoprazole 40 mg twice a day. Feels this medication has been controlling his heartburn adequately.  States that the stools have normalized, no diarrhea or constipation.  The patient denies having any nausea, vomiting, fever, chills, hematochezia,  hematemesis, abdominal distention, abdominal pain,   jaundice, pruritus or weight loss.  Last Colonoscopy:11/2022  - A single non-bleeding colonic angiodysplastic                            lesion. Treated with argon plasma coagulation (APC).                           - Three 2 to 5 mm polyps in the transverse colon                            and in the cecum, removed with a cold snare.                            Resected and retrieved.                           - One 2 mm polyp in the transverse colon, removed                            with a cold biopsy forceps. Resected and retrieved.                           - Diverticulosis in the sigmoid colon. Normal colon  biopsied.                           - The distal rectum and anal verge are normal on                            retroflexion view.   Last Endoscopy: 11/2022 - No endoscopic esophageal abnormality to explain                            patient's dysphagia. Esophagus dilated. Dilated.                           - Erythematous mucosa in the antrum. Biopsied.                           - Duodenitis. Periampullar area biopsied.   normal gastric biopsies, peptic duodenitis, three colonic tubular adenomas, and normal random colonic biopsies    Recommendations:  Repeat TCS 5 years  Past Medical History: Past Medical History:  Diagnosis Date   Alcohol abuse 11/24/2016   Anemia 10/11/2013   Cardiomyopathy (HCC)    a. EF 50% by echo in 2019 with NST showing no reversible ischemia and thought to be alcohol-induced   Chronic renal disease, stage 3, moderately decreased glomerular filtration rate (GFR) between 30-59 mL/min/1.73 square meter (HCC) 12/02/2016   Colon polyps    GERD (gastroesophageal reflux disease)    High cholesterol    Hypertension    Sinus infection     Past Surgical History: Past Surgical History:  Procedure Laterality Date   BIOPSY  06/03/2019   Procedure: BIOPSY;  Surgeon: Malissa Hippo, MD;  [facility];  Service:  Endoscopy;;  esophagus   BIOPSY N/A 11/20/2022   Procedure: RANDOM BIOPSIES;  Surgeon: Dolores Frame, MD;  [facility];  Service: Gastroenterology;  Laterality: N/A;   COLONOSCOPY N/A 10/26/2013   Procedure: COLONOSCOPY;  Surgeon: Malissa Hippo, MD;  [facility];  Service: Endoscopy;  Laterality: N/A;  325-moved to 1230 Ann to notify pt   Colonoscopy with polypectomy     COLONOSCOPY WITH PROPOFOL N/A 06/03/2019   Procedure: COLONOSCOPY WITH PROPOFOL;  Surgeon: Malissa Hippo, MD;  [facility];  Service: Endoscopy;  Laterality: N/A;   COLONOSCOPY WITH PROPOFOL N/A 11/20/2022   Procedure: COLONOSCOPY WITH PROPOFOL;  Surgeon: Dolores Frame, MD;  [facility];  Service: Gastroenterology;  Laterality: N/A;  730 ASA 3   ESOPHAGEAL DILATION N/A 11/20/2022   Procedure: ESOPHAGEAL DILATION;  Surgeon: Dolores Frame, MD;  [facility];  Service: Gastroenterology;  Laterality: N/A;   ESOPHAGOGASTRODUODENOSCOPY (EGD) WITH PROPOFOL N/A 06/03/2019   Procedure: ESOPHAGOGASTRODUODENOSCOPY (EGD) WITH PROPOFOL;  Surgeon: Malissa Hippo, MD;  [facility];  Service: Endoscopy;  Laterality: N/A;   ESOPHAGOGASTRODUODENOSCOPY (EGD) WITH PROPOFOL N/A 11/20/2022   Procedure: ESOPHAGOGASTRODUODENOSCOPY (EGD) WITH PROPOFOL;  Surgeon: Dolores Frame, MD;  [facility];  Service: Gastroenterology;  Laterality: N/A;   HOT HEMOSTASIS  11/20/2022   Procedure: HOT HEMOSTASIS (ARGON PLASMA COAGULATION/BICAP);  Surgeon: Marguerita Merles, Reuel Boom, MD;  [facility];  Service: Gastroenterology;;   POLYPECTOMY  06/03/2019   Procedure: POLYPECTOMY;  Surgeon: Malissa Hippo, MD;  [facility];  Service: Endoscopy;;  colon   POLYPECTOMY  11/20/2022   Procedure: POLYPECTOMY;  Surgeon: Dolores Frame, MD;  [facility];  Service: Gastroenterology;;    Family  History: Family History  Problem Relation Age of Onset   Hypertension Mother    Colon cancer Neg Hx     Social History: Social History   Tobacco Use  Smoking Status Former   Current packs/day: 0.00   Average packs/day: 0.5 packs/day for 20.0 years (10.0 ttl pk-yrs)   Types: Cigarettes   Start date: 12/27/1951   Quit date: 12/27/1971   Years since quitting: 52.1   Passive exposure: Past  Smokeless Tobacco Never   Social History   Substance and Sexual Activity  Alcohol Use Yes   Comment: 1 pint of etoh a day ( not recently)   Social History   Substance and Sexual Activity  Drug Use No    Allergies: Allergies  Allergen Reactions   Horseradish [Armoracia Rusticana Ext (Horseradish)] Anaphylaxis    Medications: Current Outpatient Medications  Medication Sig Dispense Refill   ALPRAZolam (XANAX) 1 MG tablet Take 1 mg by mouth daily.     aspirin 81 MG tablet Take 1 tablet (81 mg total) by mouth daily. 30 tablet    atorvastatin (LIPITOR) 10 MG tablet Take 10 mg by mouth every morning.     buPROPion (WELLBUTRIN XL) 150 MG 24 hr tablet Take 450 mg by mouth every morning.     cholecalciferol (VITAMIN D) 1000 UNITS tablet Take 1,000 Units by mouth 2 (two) times daily.     docusate sodium (STOOL SOFTENER) 100 MG capsule Take 100 mg by mouth every morning.     Ferrous Sulfate (IRON) 325 (65 Fe) MG TABS Take 1 tablet (325 mg total) by mouth 2 (two) times daily. 180 tablet 3   folic acid (FOLVITE) 1 MG tablet Take 1 mg by mouth daily.     furosemide (LASIX) 20 MG tablet Take 20 mg by mouth daily as needed for fluid or edema.     gabapentin (NEURONTIN) 300 MG capsule Take 300 mg by mouth 3 (three) times daily.     GNP VITAMIN B-1 100 MG tablet Take 100 mg by mouth daily. Thiamine     losartan (COZAAR) 50 MG tablet Take 50 mg by mouth daily.     magnesium oxide (MAG-OX) 400 MG tablet Take 400 mg by mouth 2 (two) times daily.     metoprolol succinate (TOPROL XL) 25 MG 24 hr tablet  Take 1 tablet (25 mg total) by mouth daily. 90 tablet 3   nitroGLYCERIN (NITROSTAT) 0.4 MG SL tablet Place 1 tablet (0.4 mg total) under the tongue every 5 (five) minutes as needed for chest pain. 25 tablet 3   pantoprazole (PROTONIX) 40 MG tablet Take 1 tablet (40 mg total) by mouth 2 (two) times daily. 180 tablet 3   potassium chloride SA (K-DUR) 20 MEQ tablet Take 20 mEq by mouth daily.      tamsulosin (FLOMAX) 0.4 MG CAPS capsule Take 0.4 mg by mouth daily as needed (Urine problems).     donepezil (ARICEPT) 10 MG tablet Take 10 mg by mouth daily with breakfast. (Patient not taking: Reported on 01/28/2024)     QUEtiapine (SEROQUEL) 200 MG tablet Take 200 mg by mouth at bedtime. (Patient not taking: Reported on 01/28/2024)     No current facility-administered medications for this visit.    Review of Systems: GENERAL: negative for malaise, night sweats HEENT: No changes  in hearing or vision, no nose bleeds or other nasal problems. NECK: Negative for lumps, goiter, pain and significant neck swelling RESPIRATORY: Negative for cough, wheezing CARDIOVASCULAR: Negative for chest pain, leg swelling, palpitations, orthopnea GI: SEE HPI MUSCULOSKELETAL: Negative for joint pain or swelling, back pain, and muscle pain. SKIN: Negative for lesions, rash PSYCH: Negative for sleep disturbance, mood disorder and recent psychosocial stressors. HEMATOLOGY Negative for prolonged bleeding, bruising easily, and swollen nodes. ENDOCRINE: Negative for cold or heat intolerance, polyuria, polydipsia and goiter. NEURO: negative for tremor, gait imbalance, syncope and seizures. The remainder of the review of systems is noncontributory.   Physical Exam: BP 121/71 (BP Location: Right Arm, Patient Position: Sitting, Cuff Size: Large)   Pulse 64   Temp 97.6 F (36.4 C) (Oral)   Ht 5\' 5"  (1.651 m)   Wt 190 lb 1.6 oz (86.2 kg)   BMI 31.63 kg/m  GENERAL: The patient is AO x3, in no acute distress. HEENT: Head is  normocephalic and atraumatic. EOMI are intact. Mouth is well hydrated and without lesions. NECK: Supple. No masses LUNGS: Clear to auscultation. No presence of rhonchi/wheezing/rales. Adequate chest expansion HEART: RRR, normal s1 and s2. ABDOMEN: Soft, nontender, no guarding, no peritoneal signs, and nondistended. BS +. No masses. EXTREMITIES: Without any cyanosis, clubbing, rash, lesions or edema. NEUROLOGIC: AOx3, no focal motor deficit. SKIN: no jaundice, no rashes  Imaging/Labs: as above  I personally reviewed and interpreted the available labs, imaging and endoscopic files.  Impression and Plan: JAXXEN VOONG is a 78 y.o. male with past medical history of alcohol abuse, anemia, cardiomyopathy, CKD stage 3, GERD, colon polyps, High cholesterol, HTN , who presents for follow up of iron deficiency anemia, changes in bowel movements and GERD.  Regarding the patient's iron-deficiency anemia, he has not presented any overt gastrointestinal bleeding symptom.  However, he has presented decline in his hemoglobin with low iron stores.  He has had previous EGD and colonoscopies that have been unremarkable for a cause of his anemia.  We discussed the possibility of proceeding with a capsule endoscopy to evaluate for other sources of gastrointestinal losses, especially in the small bowel.  Patient is agreeable to proceed with this.  He will continue with daily iron intake, which he may increase to twice a day dosing if needed, as well as following closely with hematology.  Patient has been in the significant improvement of his GERD symptoms while taking a PPI twice a day.  Will continue with the same doses for now, especially given his anemia, but may discuss decreasing the dosage to a 40/20 mg ratio in the future.  Finally, his bowel movements have improved in consistency while taking Benefiber.  He should continue with this supplement use.  -Schedule capsule endoscopy -Continue with oral iron  daily, may consider increasing to twice a day dosing -Continue follow-up with hematology -Continue with pantoprazole 40 mg twice a day, may consider decreasing to 40/20 milligram in follow-up appointment -Continue daily Benefiber  All questions were answered.      Katrinka Blazing, MD Gastroenterology and Hepatology [facility] Gastroenterology

## 2024-02-10 ENCOUNTER — Encounter (HOSPITAL_COMMUNITY)
Admission: RE | Disposition: A | Discharge status: Home / Self Care | Payer: Self-pay | Source: Home / Self Care | Attending: Gastroenterology

## 2024-02-10 ENCOUNTER — Ambulatory Visit (HOSPITAL_COMMUNITY)
Admission: RE | Admit: 2024-02-10 | Discharge: 2024-02-10 | Disposition: A | Discharge status: Home / Self Care | Payer: Medicare Other | Attending: Gastroenterology | Admitting: Gastroenterology

## 2024-02-10 DIAGNOSIS — D500 Iron deficiency anemia secondary to blood loss (chronic): Secondary | ICD-10-CM | POA: Insufficient documentation

## 2024-02-10 DIAGNOSIS — K222 Esophageal obstruction: Secondary | ICD-10-CM | POA: Diagnosis not present

## 2024-02-10 DIAGNOSIS — K2980 Duodenitis without bleeding: Secondary | ICD-10-CM

## 2024-02-10 DIAGNOSIS — K5520 Angiodysplasia of colon without hemorrhage: Secondary | ICD-10-CM | POA: Diagnosis not present

## 2024-02-10 HISTORY — PX: GIVENS CAPSULE STUDY: SHX5432

## 2024-02-10 SURGERY — IMAGING PROCEDURE, GI TRACT, INTRALUMINAL, VIA CAPSULE

## 2024-02-11 ENCOUNTER — Encounter (HOSPITAL_COMMUNITY): Payer: Self-pay | Admitting: Gastroenterology

## 2024-02-12 ENCOUNTER — Inpatient Hospital Stay: Payer: Medicare Other

## 2024-02-12 VITALS — BP 144/72 | HR 58 | Temp 98.0°F | Resp 20

## 2024-02-12 DIAGNOSIS — N183 Chronic kidney disease, stage 3 unspecified: Secondary | ICD-10-CM

## 2024-02-12 DIAGNOSIS — D649 Anemia, unspecified: Secondary | ICD-10-CM

## 2024-02-12 DIAGNOSIS — E538 Deficiency of other specified B group vitamins: Secondary | ICD-10-CM | POA: Diagnosis not present

## 2024-02-12 DIAGNOSIS — D631 Anemia in chronic kidney disease: Secondary | ICD-10-CM | POA: Diagnosis not present

## 2024-02-12 DIAGNOSIS — N1831 Chronic kidney disease, stage 3a: Secondary | ICD-10-CM | POA: Diagnosis not present

## 2024-02-12 DIAGNOSIS — D500 Iron deficiency anemia secondary to blood loss (chronic): Secondary | ICD-10-CM | POA: Diagnosis not present

## 2024-02-12 DIAGNOSIS — D696 Thrombocytopenia, unspecified: Secondary | ICD-10-CM

## 2024-02-12 LAB — CBC
HCT: 31.5 % — ABNORMAL LOW (ref 39.0–52.0)
Hemoglobin: 9.5 g/dL — ABNORMAL LOW (ref 13.0–17.0)
MCH: 27.4 pg (ref 26.0–34.0)
MCHC: 30.2 g/dL (ref 30.0–36.0)
MCV: 90.8 fL (ref 80.0–100.0)
Platelets: 130 10*3/uL — ABNORMAL LOW (ref 150–400)
RBC: 3.47 MIL/uL — ABNORMAL LOW (ref 4.22–5.81)
RDW: 16.5 % — ABNORMAL HIGH (ref 11.5–15.5)
WBC: 4.2 10*3/uL (ref 4.0–10.5)
nRBC: 0 % (ref 0.0–0.2)

## 2024-02-12 MED ORDER — EPOETIN ALFA 40000 UNIT/ML IJ SOLN
40000.0000 [IU] | Freq: Once | INTRAMUSCULAR | Status: AC
  Administered 2024-02-12: 40000 [IU] via SUBCUTANEOUS
  Filled 2024-02-12: qty 1

## 2024-02-12 MED ORDER — CYANOCOBALAMIN 1000 MCG/ML IJ SOLN
1000.0000 ug | Freq: Once | INTRAMUSCULAR | Status: AC
  Administered 2024-02-12: 1000 ug via INTRAMUSCULAR
  Filled 2024-02-12: qty 1

## 2024-02-12 NOTE — Progress Notes (Signed)
 Patient's Hgb  9.5 and blood pressure stable. Patient tolerated Retacrit and B12  injection with no complaints voiced.  Site clean and dry with no bruising or swelling noted at site.  See MAR for details.  Band aid applied.  Patient stable during and after injection.  Vss with discharge and left in satisfactory condition with no s/s of distress noted. All follow ups scheduled.  Jannat Rosemeyer [employer]

## 2024-02-16 NOTE — Procedures (Shared)
 Small Bowel Givens Capsule Study Procedure date: 02/10/24  Referring Provider:  PCP PCP:  Dr. Juliette Alcide, MD  Indication for procedure: Iron deficiency anemia requiring IV iron infusion  Findings:  Study was adequate as capsule reached the cecum and the bowel preparation was adequate in the small bowel. There was presence of a mid esophageal stricture.  Capsule passed adequately. There was evidence of duodenitis without presence of bleeding or stigmata of recent bleeding.  There was presence of 3 nonbleeding small AVMs in the proximal small bowel.  There were no other abnormalities besides a few lymphangiectasias.  First Gastric image:  00:02:50 First Duodenal image: 00:17:33 First Cecal image: 03:06:16 Gastric Passage time: 0h 84m Small Bowel Passage time:  2h 23m  Summary & Recommendations: Patient presenting with iron-deficiency anemia, on oral and IV iron supplementation.  Found to have a few proximal small bowel AVMs without active bleeding.  Will proceed with a push enteroscopy.  He should continue with IV iron infusion and follow-up with hematology.  I personally communicated these recommendations to the patient  Katrinka Blazing, MD Gastroenterology and Hepatology [facility] Gastroenterology

## 2024-02-17 ENCOUNTER — Telehealth (INDEPENDENT_AMBULATORY_CARE_PROVIDER_SITE_OTHER): Payer: Self-pay | Admitting: Gastroenterology

## 2024-02-17 NOTE — Telephone Encounter (Signed)
 Dolores Frame, MD  Marlowe Shores, LPN [place] Houseman,  Can you please schedule a push enteroscopy? Dx: Iron deficiency anemia and abnormal capsule endoscopy. Room: 3  Thanks,  Katrinka Blazing, MD Gastroenterology and Hepatology [facility] Gastroenterology

## 2024-02-24 NOTE — Telephone Encounter (Signed)
 Voicemail full on cell number. Called home number and pt asked that I call back tomorrow anytime.

## 2024-02-25 NOTE — Telephone Encounter (Signed)
 Contacted pt. Pt asked that I call back later. Also gave pt my direct number

## 2024-03-04 ENCOUNTER — Inpatient Hospital Stay: Payer: Medicare Other

## 2024-03-04 ENCOUNTER — Inpatient Hospital Stay: Payer: Medicare Other | Attending: Hematology

## 2024-03-04 VITALS — BP 113/62 | HR 70 | Temp 97.0°F | Resp 18

## 2024-03-04 DIAGNOSIS — D500 Iron deficiency anemia secondary to blood loss (chronic): Secondary | ICD-10-CM

## 2024-03-04 DIAGNOSIS — D631 Anemia in chronic kidney disease: Secondary | ICD-10-CM | POA: Insufficient documentation

## 2024-03-04 DIAGNOSIS — E538 Deficiency of other specified B group vitamins: Secondary | ICD-10-CM | POA: Diagnosis not present

## 2024-03-04 DIAGNOSIS — N183 Chronic kidney disease, stage 3 unspecified: Secondary | ICD-10-CM

## 2024-03-04 DIAGNOSIS — D696 Thrombocytopenia, unspecified: Secondary | ICD-10-CM

## 2024-03-04 DIAGNOSIS — N1831 Chronic kidney disease, stage 3a: Secondary | ICD-10-CM | POA: Diagnosis not present

## 2024-03-04 DIAGNOSIS — D649 Anemia, unspecified: Secondary | ICD-10-CM

## 2024-03-04 LAB — CBC
HCT: 33.6 % — ABNORMAL LOW (ref 39.0–52.0)
Hemoglobin: 10.2 g/dL — ABNORMAL LOW (ref 13.0–17.0)
MCH: 27.2 pg (ref 26.0–34.0)
MCHC: 30.4 g/dL (ref 30.0–36.0)
MCV: 89.6 fL (ref 80.0–100.0)
Platelets: 153 10*3/uL (ref 150–400)
RBC: 3.75 MIL/uL — ABNORMAL LOW (ref 4.22–5.81)
RDW: 16.7 % — ABNORMAL HIGH (ref 11.5–15.5)
WBC: 6.5 10*3/uL (ref 4.0–10.5)
nRBC: 0 % (ref 0.0–0.2)

## 2024-03-04 MED ORDER — EPOETIN ALFA 40000 UNIT/ML IJ SOLN
40000.0000 [IU] | Freq: Once | INTRAMUSCULAR | Status: AC
  Administered 2024-03-04: 40000 [IU] via SUBCUTANEOUS
  Filled 2024-03-04: qty 1

## 2024-03-04 MED ORDER — CYANOCOBALAMIN 1000 MCG/ML IJ SOLN
1000.0000 ug | Freq: Once | INTRAMUSCULAR | Status: AC
  Administered 2024-03-04: 1000 ug via INTRAMUSCULAR
  Filled 2024-03-04: qty 1

## 2024-03-04 NOTE — Progress Notes (Signed)
 Patient presents today for B12 and Procrit injections per providers order.  Vital signs WNL.  Hgb noted to be 10.2.  Patient has no new complaints at this time. Stable during administration without incident; injection site WNL; see MAR for injection details.  Patient tolerated procedure well and without incident.  No questions or complaints noted at this time.

## 2024-03-04 NOTE — Patient Instructions (Signed)
 [facility] [place] - A DEPT OF [facility]  Discharge Instructions: Thank you for choosing [facility] to provide your oncology and hematology care.  If you have a lab appointment with the Cancer Center - please note that after April 8th, 2024, all labs will be drawn in the cancer center.  You do not have to check in or register with the main entrance as you have in the past but will complete your check-in in the cancer center.  Wear comfortable clothing and clothing appropriate for easy access to any Portacath or PICC line.   We strive to give you quality time with your provider. You may need to reschedule your appointment if you arrive late (15 or more minutes).  Arriving late affects you and other patients whose appointments are after yours.  Also, if you miss three or more appointments without notifying the office, you may be dismissed from the clinic at the provider's discretion.      For prescription refill requests, have your pharmacy contact our office and allow 72 hours for refills to be completed.    Today you received the following chemotherapy and/or immunotherapy agents Procrit B12      To help prevent nausea and vomiting after your treatment, we encourage you to take your nausea medication as directed.  BELOW ARE SYMPTOMS THAT SHOULD BE REPORTED IMMEDIATELY: *FEVER GREATER THAN 100.4 F (38 C) OR HIGHER *CHILLS OR SWEATING *NAUSEA AND VOMITING THAT IS NOT CONTROLLED WITH YOUR NAUSEA MEDICATION *UNUSUAL SHORTNESS OF BREATH *UNUSUAL BRUISING OR BLEEDING *URINARY PROBLEMS (pain or burning when urinating, or frequent urination) *BOWEL PROBLEMS (unusual diarrhea, constipation, pain near the anus) TENDERNESS IN MOUTH AND THROAT WITH OR WITHOUT PRESENCE OF ULCERS (sore throat, sores in mouth, or a toothache) UNUSUAL RASH, SWELLING OR PAIN  UNUSUAL VAGINAL DISCHARGE OR ITCHING   Items with * indicate a potential emergency and should be followed  up as soon as possible or go to the Emergency Department if any problems should occur.  Please show the CHEMOTHERAPY ALERT CARD or IMMUNOTHERAPY ALERT CARD at check-in to the Emergency Department and triage nurse.  Should you have questions after your visit or need to cancel or reschedule your appointment, please contact [facility] [place] - A DEPT OF [facility] [phone]  and follow the prompts.  Office hours are 8:00 a.m. to 4:30 p.m. Monday - Friday. Please note that voicemails left after 4:00 p.m. may not be returned until the following business day.  We are closed weekends and major holidays. You have access to a nurse at all times for urgent questions. Please call the main number to the clinic [phone] and follow the prompts.  For any non-urgent questions, you may also contact your provider using MyChart. We now offer e-Visits for anyone 46 and older to request care online for non-urgent symptoms. For details visit [email or web address].   Also download the MyChart app! Go to the app store, search "MyChart", open the app, select [place], and log in with your MyChart username and password.

## 2024-03-25 ENCOUNTER — Inpatient Hospital Stay: Payer: Medicare Other

## 2024-03-25 ENCOUNTER — Inpatient Hospital Stay: Payer: Medicare Other | Attending: Hematology

## 2024-03-25 VITALS — BP 156/80 | HR 70 | Temp 98.2°F | Resp 18

## 2024-03-25 DIAGNOSIS — E538 Deficiency of other specified B group vitamins: Secondary | ICD-10-CM | POA: Insufficient documentation

## 2024-03-25 DIAGNOSIS — D631 Anemia in chronic kidney disease: Secondary | ICD-10-CM | POA: Insufficient documentation

## 2024-03-25 DIAGNOSIS — N183 Chronic kidney disease, stage 3 unspecified: Secondary | ICD-10-CM

## 2024-03-25 DIAGNOSIS — D500 Iron deficiency anemia secondary to blood loss (chronic): Secondary | ICD-10-CM | POA: Diagnosis not present

## 2024-03-25 DIAGNOSIS — D696 Thrombocytopenia, unspecified: Secondary | ICD-10-CM

## 2024-03-25 DIAGNOSIS — E875 Hyperkalemia: Secondary | ICD-10-CM | POA: Diagnosis not present

## 2024-03-25 DIAGNOSIS — Z87891 Personal history of nicotine dependence: Secondary | ICD-10-CM | POA: Insufficient documentation

## 2024-03-25 DIAGNOSIS — N1831 Chronic kidney disease, stage 3a: Secondary | ICD-10-CM | POA: Diagnosis not present

## 2024-03-25 DIAGNOSIS — D649 Anemia, unspecified: Secondary | ICD-10-CM

## 2024-03-25 DIAGNOSIS — N179 Acute kidney failure, unspecified: Secondary | ICD-10-CM | POA: Diagnosis not present

## 2024-03-25 LAB — CBC
HCT: 32.8 % — ABNORMAL LOW (ref 39.0–52.0)
Hemoglobin: 10.2 g/dL — ABNORMAL LOW (ref 13.0–17.0)
MCH: 28.5 pg (ref 26.0–34.0)
MCHC: 31.1 g/dL (ref 30.0–36.0)
MCV: 91.6 fL (ref 80.0–100.0)
Platelets: 210 10*3/uL (ref 150–400)
RBC: 3.58 MIL/uL — ABNORMAL LOW (ref 4.22–5.81)
RDW: 18 % — ABNORMAL HIGH (ref 11.5–15.5)
WBC: 4.6 10*3/uL (ref 4.0–10.5)
nRBC: 0 % (ref 0.0–0.2)

## 2024-03-25 MED ORDER — EPOETIN ALFA 40000 UNIT/ML IJ SOLN
40000.0000 [IU] | Freq: Once | INTRAMUSCULAR | Status: AC
  Administered 2024-03-25: 40000 [IU] via SUBCUTANEOUS
  Filled 2024-03-25: qty 1

## 2024-03-25 NOTE — Patient Instructions (Signed)

## 2024-03-25 NOTE — Progress Notes (Signed)
Patient presents today for Retacrit injection. Hemoglobin reviewed prior to administration. VSS tolerated without incident or complaint. See MAR for details. Patient stable during and after injection. Patient discharged in satisfactory condition with no s/s of distress noted.  

## 2024-04-07 ENCOUNTER — Inpatient Hospital Stay (HOSPITAL_BASED_OUTPATIENT_CLINIC_OR_DEPARTMENT_OTHER): Payer: Medicare Other | Admitting: Oncology

## 2024-04-07 DIAGNOSIS — E538 Deficiency of other specified B group vitamins: Secondary | ICD-10-CM | POA: Diagnosis not present

## 2024-04-07 DIAGNOSIS — D631 Anemia in chronic kidney disease: Secondary | ICD-10-CM

## 2024-04-07 DIAGNOSIS — N179 Acute kidney failure, unspecified: Secondary | ICD-10-CM | POA: Diagnosis not present

## 2024-04-07 DIAGNOSIS — D649 Anemia, unspecified: Secondary | ICD-10-CM

## 2024-04-07 DIAGNOSIS — N183 Chronic kidney disease, stage 3 unspecified: Secondary | ICD-10-CM

## 2024-04-07 DIAGNOSIS — D696 Thrombocytopenia, unspecified: Secondary | ICD-10-CM | POA: Diagnosis not present

## 2024-04-07 DIAGNOSIS — D500 Iron deficiency anemia secondary to blood loss (chronic): Secondary | ICD-10-CM

## 2024-04-07 DIAGNOSIS — E875 Hyperkalemia: Secondary | ICD-10-CM | POA: Diagnosis not present

## 2024-04-07 DIAGNOSIS — N1831 Chronic kidney disease, stage 3a: Secondary | ICD-10-CM | POA: Diagnosis not present

## 2024-04-07 NOTE — Progress Notes (Signed)
 [facility] [address], [place] [address]   CLINIC:  Medical Oncology/Hematology  PCP:  Alston Jerry, MD [address] [place] [address] [phone]   REASON FOR VISIT:  Follow-up for anemia of CKD and iron  deficiency   PRIOR THERAPY: None   CURRENT THERAPY: Retacrit  injections weekly; intermittent parenteral iron  therapy; oral iron  supplement  INTERVAL HISTORY:   Roger Hansen 78 y.o. male returns for routine follow-up of his normocytic anemia.  This he was last seen in clinic by me on 01/07/2024.  He denies any interval hospitalizations.  Tolerating Retacrit  well.   He has been receiving Retacrit  every 3 weeks and tolerating well. Hemoglobin stable around 9.5-10.  Patient had capsule endoscopy on 02/10/2024 with Dr. Sammi Crick.  It showed the presence of a mid esophageal stricture and evidence of duodenitis without presence of bleeding or stigmata of recent bleeding.  There were 3 nonbleeding small AVMs in the proximal small bowel.  No other abnormalities besides a few lymphangiectasias.  Plan was to proceed with push enteroscopy.  This has not yet been scheduled.  Reports he continues to have intermittent constipation due to iron  supplements.  He takes stool softeners and metamucil which help.  Denies any obvious bleeding although his stools are dark.  He has occasional shortness of breath especially with exertion.  He has some sleep problems and underlying depression.  Has occasional dizziness and headaches but these are chronic and stable.  No new medications.  Reports the following has been to him and he has some nasal congestion and sinus issues but otherwise feels well.  He has been taking his potassium supplements daily furosemide.  His levels have not been checked in a while.  Reports an appetite of 100% and energy levels of 60%.  Having back and neck pain that he rates a 6 out of 10.  ASSESSMENT & PLAN:  1.   Normocytic anemia - His anemia dates  back to at least 2014 with a negative peripheral work-up, negative bone marrow aspiration and biopsy and cytogenetics on 10/16/2016 (obtained in the setting of heavy alcohol  use). The bone marrow showed slightly hypercellular marrow with trilineage hematopoiesis. Chromosome analysis was normal. - MGUS/myeloma panel (09/20/2021) was negative.  Hemolysis work-up was negative.   -Etiology thought to be secondary to CKD, functional iron  deficiency and anemia from chronic disease. Occult GI blood loss possible - he does have intermittent drops in his Hgb and presence of nonbleeding AVM in colon on most recent colonoscopy but without associated drops in iron  Patient reports that he has been anemic ever since [place] and had significant agent orange exposure, which has been associated with aplastic anemia and other conditions. Would also consider possible early MDS (bone marrow biopsy in 2017 showed subtle dyspoietic changes that were nondiagnostic and nonspecific at that time) Myelosuppression from alcohol  consumption.  Prior history of heavy alcohol  use (from 2007-2017), but has lately been drinking about a fifth of liquor each week. - He has been receiving intermittent Epogen  and parenteral iron  therapy since June 2020. - He is a Jehovah witness and does not receive blood products. - EGD (11/20/2022): Erythematous gastric mucosa, duodenitis - Colonoscopy (11/20/2022): Single nonbleeding angiodysplastic lesion treated with APC, polyps x 4 (pathology benign), diverticulosis   2.  B12 deficiency - Patient was started on monthly B12 injections in November 2022 - B12 normal at 426 with normal MMA as of 10/10/2022 -B12 level on 06/16/2023 was 355 and MMA is 251.  3.  CKD stage II with intermittent AKI -She is followed by nephrology. -Kidney function appears stable.  PLAN: 1. Iron  deficiency anemia due to chronic blood loss (Primary) - He continues to take iron  tablets 325 mg once daily.  We discussed given  his stools have been very hard, trying iron  supplements every other day to see if this helps.  Continue Metamucil and stool softeners as needed. - He has required intermittent IV iron  (300 mg IV Venofer ) last given on 01/13/2024. - He reports intermittent black bowel movements.  He denies any bright red blood per rectum, hematemesis, or epistaxis.    -She had a recent capsule study (02/10/2024) which showed mid esophageal stricture, duodenitis doubt the presence of bleeding or stigmata of recent bleeding and 3 nonbleeding small AVMs in proximal small bowel.  He will be scheduled for a push enteroscopy in the near future. - Labs from 03/25/2024 show hemoglobin of 10.2 (10.2) with a normal differential. -Unfortunately, iron  labs were not drawn.  Will get these drawn today. -We discussed if he continues to have worsening anemia, repeat bone marrow biopsy would be reasonable.  2. Anemia in stage 3a chronic kidney disease (HCC) -He receives 40,000 units of Retacrit  every 3 weeks-last given on 03/25/2024. -Hemoglobin has ranged between 9 and 10.2 over the past few months. -Recommend we continue Retacrit  every 3 weeks.  Hemoglobin appears to be improving.  3. B12 deficiency -Labs from 01/01/2024 show an MMA of 559 and B12 level of 383. -Continue every 3-week B12 injections along with his Retacrit . -Redraw B12 level in 3 weeks as his last B12 level was drawn in January.  4. Hyperkalemia -He is on 20 mill equivalents potassium supplements daily prescribed by PCP.  -Reports he has not had his level checked in a while. -Recommend rechecking his potassium levels and adjusting as needed based on results.   PLAN SUMMARY: >> Add on additional labs for lab draw on 04/15/24 for his Retacrit .  >> Continue B12 injections every 3 weeks.  Will adjust as needed based on labs from 04/15/24. >> Continue every 3-week Retacrit  injections and CBC and return in 3 months with labs (CBC/D, CMP, ferritin, iron  panel, B12 and  MMA) a few days before and office visit.     REVIEW OF SYSTEMS:   Review of Systems  Respiratory:  Positive for shortness of breath.   Gastrointestinal:  Positive for constipation and diarrhea.  Musculoskeletal:  Positive for arthralgias and back pain.  Neurological:  Positive for headaches.  Psychiatric/Behavioral:  Positive for depression. The patient is nervous/anxious.      PHYSICAL EXAM:  ECOG PERFORMANCE STATUS: 1 - Symptomatic but completely ambulatory  There were no vitals filed for this visit.   There were no vitals filed for this visit.   Physical Exam Constitutional:      Appearance: Normal appearance. He is obese.  Cardiovascular:     Rate and Rhythm: Normal rate and regular rhythm.  Pulmonary:     Effort: Pulmonary effort is normal.     Breath sounds: Normal breath sounds.  Abdominal:     General: Bowel sounds are normal.     Palpations: Abdomen is soft.  Musculoskeletal:        General: No swelling. Normal range of motion.  Neurological:     Mental Status: He is alert and oriented to person, place, and time. Mental status is at baseline.     PAST MEDICAL/SURGICAL HISTORY:  Past Medical History:  Diagnosis Date  Alcohol  abuse 11/24/2016   Anemia 10/11/2013   Cardiomyopathy (HCC)    a. EF 50% by echo in 2019 with NST showing no reversible ischemia and thought to be alcohol -induced   Chronic renal disease, stage 3, moderately decreased glomerular filtration rate (GFR) between 30-59 mL/min/1.73 square meter (HCC) 12/02/2016   Colon polyps    GERD (gastroesophageal reflux disease)    High cholesterol    Hypertension    Sinus infection    Past Surgical History:  Procedure Laterality Date   BIOPSY  06/03/2019   Procedure: BIOPSY;  Surgeon: Ruby Corporal, MD;  [facility];  Service: Endoscopy;;  esophagus   BIOPSY N/A 11/20/2022   Procedure: RANDOM BIOPSIES;  Surgeon: Urban Garden, MD;  [facility];  Service:  Gastroenterology;  Laterality: N/A;   COLONOSCOPY N/A 10/26/2013   Procedure: COLONOSCOPY;  Surgeon: Ruby Corporal, MD;  [facility];  Service: Endoscopy;  Laterality: N/A;  325-moved to 1230 Ann to notify pt   Colonoscopy with polypectomy     COLONOSCOPY WITH PROPOFOL  N/A 06/03/2019   Procedure: COLONOSCOPY WITH PROPOFOL ;  Surgeon: Ruby Corporal, MD;  [facility];  Service: Endoscopy;  Laterality: N/A;   COLONOSCOPY WITH PROPOFOL  N/A 11/20/2022   Procedure: COLONOSCOPY WITH PROPOFOL ;  Surgeon: Urban Garden, MD;  [facility];  Service: Gastroenterology;  Laterality: N/A;  730 ASA 3   ESOPHAGEAL DILATION N/A 11/20/2022   Procedure: ESOPHAGEAL DILATION;  Surgeon: Urban Garden, MD;  [facility];  Service: Gastroenterology;  Laterality: N/A;   ESOPHAGOGASTRODUODENOSCOPY (EGD) WITH PROPOFOL  N/A 06/03/2019   Procedure: ESOPHAGOGASTRODUODENOSCOPY (EGD) WITH PROPOFOL ;  Surgeon: Ruby Corporal, MD;  [facility];  Service: Endoscopy;  Laterality: N/A;   ESOPHAGOGASTRODUODENOSCOPY (EGD) WITH PROPOFOL  N/A 11/20/2022   Procedure: ESOPHAGOGASTRODUODENOSCOPY (EGD) WITH PROPOFOL ;  Surgeon: Urban Garden, MD;  [facility];  Service: Gastroenterology;  Laterality: N/A;   GIVENS CAPSULE STUDY N/A 02/10/2024   Procedure: GIVENS CAPSULE STUDY;  Surgeon: Urban Garden, MD;  [facility];  Service: Gastroenterology;  Laterality: N/A;  8:30AM;GIVENS   HOT HEMOSTASIS  11/20/2022   Procedure: HOT HEMOSTASIS (ARGON PLASMA COAGULATION/BICAP);  Surgeon: Umberto Ganong, Bearl Limes, MD;  [facility];  Service: Gastroenterology;;   POLYPECTOMY  06/03/2019   Procedure: POLYPECTOMY;  Surgeon: Ruby Corporal, MD;  [facility];  Service: Endoscopy;;  colon   POLYPECTOMY  11/20/2022   Procedure: POLYPECTOMY;  Surgeon: Urban Garden, MD;  [facility];   Service: Gastroenterology;;    SOCIAL HISTORY:  Social History   Socioeconomic History   Marital status: Married    Spouse name: Not on file   Number of children: Not on file   Years of education: Not on file   Highest education level: Not on file  Occupational History   Not on file  Tobacco Use   Smoking status: Former    Current packs/day: 0.00    Average packs/day: 0.5 packs/day for 20.0 years (10.0 ttl pk-yrs)    Types: Cigarettes    Start date: 12/27/1951    Quit date: 12/27/1971    Years since quitting: 52.3    Passive exposure: Past   Smokeless tobacco: Never  Vaping Use   Vaping status: Never Used  Substance and Sexual Activity   Alcohol  use: Yes    Comment: 1 pint of etoh a day ( not recently)   Drug  use: No   Sexual activity: Not on file  Other Topics Concern   Not on file  Social History Narrative   Not on file   Social Drivers of Health   Financial Resource Strain: Not on file  Food Insecurity: No Food Insecurity (11/19/2020)   Hunger Vital Sign    Worried About Running Out of Food in the Last Year: Never true    Ran Out of Food in the Last Year: Never true  Transportation Needs: No Transportation Needs (11/19/2020)   PRAPARE - Administrator, Civil Service (Medical): No    Lack of Transportation (Non-Medical): No  Physical Activity: Inactive (11/19/2020)   Exercise Vital Sign    Days of Exercise per Week: 0 days    Minutes of Exercise per Session: 0 min  Stress: No Stress Concern Present (11/19/2020)   [employer] of Occupational Health - Occupational Stress Questionnaire    Feeling of Stress : Not at all  Social Connections: Not on file  Intimate Partner Violence: Not At Risk (11/19/2020)   Humiliation, Afraid, Rape, and Kick questionnaire    Fear of Current or Ex-Partner: No    Emotionally Abused: No    Physically Abused: No    Sexually Abused: No    FAMILY HISTORY:  Family History  Problem Relation Age of Onset   Hypertension  Mother    Colon cancer Neg Hx     CURRENT MEDICATIONS:  Outpatient Encounter Medications as of 04/07/2024  Medication Sig Note   ALPRAZolam (XANAX) 1 MG tablet Take 1 mg by mouth daily.    aspirin 81 MG tablet Take 1 tablet (81 mg total) by mouth daily.    atorvastatin (LIPITOR) 10 MG tablet Take 10 mg by mouth every morning.    buPROPion (WELLBUTRIN XL) 150 MG 24 hr tablet Take 450 mg by mouth every morning.    cholecalciferol (VITAMIN D ) 1000 UNITS tablet Take 1,000 Units by mouth 2 (two) times daily. 01/28/2024: Once per day.   docusate sodium (STOOL SOFTENER) 100 MG capsule Take 100 mg by mouth every morning.    donepezil (ARICEPT) 10 MG tablet Take 10 mg by mouth daily with breakfast. (Patient not taking: Reported on 01/28/2024)    Ferrous Sulfate  (IRON ) 325 (65 Fe) MG TABS Take 1 tablet (325 mg total) by mouth 2 (two) times daily. 03/03/2023: One daily   folic acid  (FOLVITE ) 1 MG tablet Take 1 mg by mouth daily.    furosemide (LASIX) 20 MG tablet Take 20 mg by mouth daily as needed for fluid or edema.    gabapentin (NEURONTIN) 300 MG capsule Take 300 mg by mouth 3 (three) times daily.    GNP VITAMIN B-1 100 MG tablet Take 100 mg by mouth daily. Thiamine    losartan (COZAAR) 50 MG tablet Take 50 mg by mouth daily.    magnesium  oxide (MAG-OX) 400 MG tablet Take 400 mg by mouth 2 (two) times daily. 01/28/2024: Once per day.   metoprolol  succinate (TOPROL  XL) 25 MG 24 hr tablet Take 1 tablet (25 mg total) by mouth daily.    nitroGLYCERIN  (NITROSTAT ) 0.4 MG SL tablet Place 1 tablet (0.4 mg total) under the tongue every 5 (five) minutes as needed for chest pain.    pantoprazole  (PROTONIX ) 40 MG tablet Take 1 tablet (40 mg total) by mouth 2 (two) times daily.    potassium chloride  SA (K-DUR) 20 MEQ tablet Take 20 mEq by mouth daily.     QUEtiapine (SEROQUEL) 200  MG tablet Take 200 mg by mouth at bedtime. (Patient not taking: Reported on 01/28/2024)    tamsulosin (FLOMAX) 0.4 MG CAPS capsule Take  0.4 mg by mouth daily as needed (Urine problems).    No facility-administered encounter medications on file as of 04/07/2024.    ALLERGIES:  Allergies  Allergen Reactions   Horseradish [Armoracia Rusticana Ext (Horseradish)] Anaphylaxis    LABORATORY DATA:  I have reviewed the labs as listed.  CBC    Component Value Date/Time   WBC 4.6 03/25/2024 0918   RBC 3.58 (L) 03/25/2024 0918   HGB 10.2 (L) 03/25/2024 0918   HCT 32.8 (L) 03/25/2024 0918   HCT 27.9 (L) 08/13/2016 0944   PLT 210 03/25/2024 0918   MCV 91.6 03/25/2024 0918   MCH 28.5 03/25/2024 0918   MCHC 31.1 03/25/2024 0918   RDW 18.0 (H) 03/25/2024 0918   LYMPHSABS 1.7 01/01/2024 0908   MONOABS 0.6 01/01/2024 0908   EOSABS 0.2 01/01/2024 0908   BASOSABS 0.1 01/01/2024 0908      Latest Ref Rng & Units 01/07/2024    9:01 AM 01/01/2024    9:08 AM 06/16/2023   10:06 AM  CMP  Glucose 70 - 99 mg/dL  98  409   BUN 8 - 23 mg/dL  22  12   Creatinine 8.11 - 1.24 mg/dL  9.14  7.82   Sodium 956 - 145 mmol/L  134  137   Potassium 3.5 - 5.1 mmol/L 5.4  5.6  3.8   Chloride 98 - 111 mmol/L  107  102   CO2 22 - 32 mmol/L  22  27   Calcium 8.9 - 10.3 mg/dL  9.2  9.1   Total Protein 6.5 - 8.1 g/dL  7.0  6.7   Total Bilirubin 0.0 - 1.2 mg/dL  0.5  1.5   Alkaline Phos 38 - 126 U/L  51  63   AST 15 - 41 U/L  13  21   ALT 0 - 44 U/L  8  16     DIAGNOSTIC IMAGING:  I have independently reviewed the relevant imaging and discussed with the patient.   WRAP UP:  All questions were answered. The patient knows to call the clinic with any problems, questions or concerns.  Medical decision making: Moderate  Time spent on visit: I spent 25 minutes dedicated to the care of this patient (face-to-face and non-face-to-face) on the date of the encounter to include what is described in the assessment and plan.  Roger Cooler, NP 04/07/2024 10:17 AM

## 2024-04-15 ENCOUNTER — Inpatient Hospital Stay

## 2024-04-15 ENCOUNTER — Inpatient Hospital Stay: Attending: Hematology | Admitting: Oncology

## 2024-04-15 VITALS — BP 120/66 | HR 64 | Temp 97.7°F | Resp 18

## 2024-04-15 DIAGNOSIS — N183 Chronic kidney disease, stage 3 unspecified: Secondary | ICD-10-CM

## 2024-04-15 DIAGNOSIS — N1831 Chronic kidney disease, stage 3a: Secondary | ICD-10-CM | POA: Diagnosis not present

## 2024-04-15 DIAGNOSIS — E611 Iron deficiency: Secondary | ICD-10-CM | POA: Diagnosis not present

## 2024-04-15 DIAGNOSIS — D649 Anemia, unspecified: Secondary | ICD-10-CM

## 2024-04-15 DIAGNOSIS — E538 Deficiency of other specified B group vitamins: Secondary | ICD-10-CM | POA: Diagnosis not present

## 2024-04-15 DIAGNOSIS — D631 Anemia in chronic kidney disease: Secondary | ICD-10-CM | POA: Insufficient documentation

## 2024-04-15 DIAGNOSIS — D500 Iron deficiency anemia secondary to blood loss (chronic): Secondary | ICD-10-CM

## 2024-04-15 LAB — CBC WITH DIFFERENTIAL/PLATELET
Abs Immature Granulocytes: 0 10*3/uL (ref 0.00–0.07)
Basophils Absolute: 0 10*3/uL (ref 0.0–0.1)
Basophils Relative: 1 %
Eosinophils Absolute: 0.2 10*3/uL (ref 0.0–0.5)
Eosinophils Relative: 4 %
HCT: 31.4 % — ABNORMAL LOW (ref 39.0–52.0)
Hemoglobin: 9.4 g/dL — ABNORMAL LOW (ref 13.0–17.0)
Immature Granulocytes: 0 %
Lymphocytes Relative: 41 %
Lymphs Abs: 1.9 10*3/uL (ref 0.7–4.0)
MCH: 27.6 pg (ref 26.0–34.0)
MCHC: 29.9 g/dL — ABNORMAL LOW (ref 30.0–36.0)
MCV: 92.1 fL (ref 80.0–100.0)
Monocytes Absolute: 0.5 10*3/uL (ref 0.1–1.0)
Monocytes Relative: 11 %
Neutro Abs: 2 10*3/uL (ref 1.7–7.7)
Neutrophils Relative %: 43 %
Platelets: 195 10*3/uL (ref 150–400)
RBC: 3.41 MIL/uL — ABNORMAL LOW (ref 4.22–5.81)
RDW: 18.5 % — ABNORMAL HIGH (ref 11.5–15.5)
WBC: 4.6 10*3/uL (ref 4.0–10.5)
nRBC: 0 % (ref 0.0–0.2)

## 2024-04-15 LAB — IRON AND TIBC
Iron: 31 ug/dL — ABNORMAL LOW (ref 45–182)
Saturation Ratios: 12 % — ABNORMAL LOW (ref 17.9–39.5)
TIBC: 267 ug/dL (ref 250–450)
UIBC: 236 ug/dL

## 2024-04-15 LAB — COMPREHENSIVE METABOLIC PANEL WITH GFR
ALT: 9 U/L (ref 0–44)
AST: 14 U/L — ABNORMAL LOW (ref 15–41)
Albumin: 3.8 g/dL (ref 3.5–5.0)
Alkaline Phosphatase: 58 U/L (ref 38–126)
Anion gap: 7 (ref 5–15)
BUN: 16 mg/dL (ref 8–23)
CO2: 26 mmol/L (ref 22–32)
Calcium: 9.5 mg/dL (ref 8.9–10.3)
Chloride: 102 mmol/L (ref 98–111)
Creatinine, Ser: 1.19 mg/dL (ref 0.61–1.24)
GFR, Estimated: >60 mL/min (ref >60)
Glucose, Bld: 100 mg/dL — ABNORMAL HIGH (ref 70–99)
Potassium: 5.1 mmol/L (ref 3.5–5.1)
Sodium: 135 mmol/L (ref 135–145)
Total Bilirubin: 0.7 mg/dL (ref 0.0–1.2)
Total Protein: 6.9 g/dL (ref 6.5–8.1)

## 2024-04-15 LAB — VITAMIN B12: Vitamin B-12: 479 pg/mL (ref 180–914)

## 2024-04-15 LAB — FERRITIN: Ferritin: 423 ng/mL — ABNORMAL HIGH (ref 24–336)

## 2024-04-15 MED ORDER — CYANOCOBALAMIN 1000 MCG/ML IJ SOLN
1000.0000 ug | Freq: Once | INTRAMUSCULAR | Status: AC
  Administered 2024-04-15: 1000 ug via INTRAMUSCULAR
  Filled 2024-04-15: qty 1

## 2024-04-15 MED ORDER — EPOETIN ALFA 40000 UNIT/ML IJ SOLN
40000.0000 [IU] | Freq: Once | INTRAMUSCULAR | Status: AC
  Administered 2024-04-15: 40000 [IU] via SUBCUTANEOUS
  Filled 2024-04-15: qty 1

## 2024-04-15 NOTE — Patient Instructions (Signed)
 [facility] [place] - A DEPT OF [facility]  Discharge Instructions: Thank you for choosing [facility] to provide your oncology and hematology care.  If you have a lab appointment with the Cancer Center - please note that after April 8th, 2024, all labs will be drawn in the cancer center.  You do not have to check in or register with the main entrance as you have in the past but will complete your check-in in the cancer center.  Wear comfortable clothing and clothing appropriate for easy access to any Portacath or PICC line.   We strive to give you quality time with your provider. You may need to reschedule your appointment if you arrive late (15 or more minutes).  Arriving late affects you and other patients whose appointments are after yours.  Also, if you miss three or more appointments without notifying the office, you may be dismissed from the clinic at the provider's discretion.      For prescription refill requests, have your pharmacy contact our office and allow 72 hours for refills to be completed.    Today you received the following chemotherapy and/or immunotherapy agents Epogen  b12      To help prevent nausea and vomiting after your treatment, we encourage you to take your nausea medication as directed.  BELOW ARE SYMPTOMS THAT SHOULD BE REPORTED IMMEDIATELY: *FEVER GREATER THAN 100.4 F (38 C) OR HIGHER *CHILLS OR SWEATING *NAUSEA AND VOMITING THAT IS NOT CONTROLLED WITH YOUR NAUSEA MEDICATION *UNUSUAL SHORTNESS OF BREATH *UNUSUAL BRUISING OR BLEEDING *URINARY PROBLEMS (pain or burning when urinating, or frequent urination) *BOWEL PROBLEMS (unusual diarrhea, constipation, pain near the anus) TENDERNESS IN MOUTH AND THROAT WITH OR WITHOUT PRESENCE OF ULCERS (sore throat, sores in mouth, or a toothache) UNUSUAL RASH, SWELLING OR PAIN  UNUSUAL VAGINAL DISCHARGE OR ITCHING   Items with * indicate a potential emergency and should be followed  up as soon as possible or go to the Emergency Department if any problems should occur.  Please show the CHEMOTHERAPY ALERT CARD or IMMUNOTHERAPY ALERT CARD at check-in to the Emergency Department and triage nurse.  Should you have questions after your visit or need to cancel or reschedule your appointment, please contact [facility] [place] - A DEPT OF [facility] [phone]  and follow the prompts.  Office hours are 8:00 a.m. to 4:30 p.m. Monday - Friday. Please note that voicemails left after 4:00 p.m. may not be returned until the following business day.  We are closed weekends and major holidays. You have access to a nurse at all times for urgent questions. Please call the main number to the clinic [phone] and follow the prompts.  For any non-urgent questions, you may also contact your provider using MyChart. We now offer e-Visits for anyone 52 and older to request care online for non-urgent symptoms. For details visit [email or web address].   Also download the MyChart app! Go to the app store, search "MyChart", open the app, select [place], and log in with your MyChart username and password.

## 2024-04-15 NOTE — Progress Notes (Signed)
 Roger Hansen presents today for Epogen  and B12 injections per the provider's orders.  Hgb noted to be 9.4.  Stable during administration without incident; injection site WNL; see MAR for injection details.  Patient tolerated procedure well and without incident.  No questions or complaints noted at this time.

## 2024-04-18 NOTE — Progress Notes (Signed)
 Attempted to contact patient x 3 today.  Unable to leave message on cell number and home phone ringing busy.  Will continue to attempt to follow up.

## 2024-04-20 LAB — METHYLMALONIC ACID, SERUM: Methylmalonic Acid, Quantitative: 362 nmol/L (ref 0–378)

## 2024-04-20 NOTE — Progress Notes (Signed)
 Thank you :)

## 2024-04-21 NOTE — Progress Notes (Signed)
 Patient called.  Spoke to his son, who took a message for patient to call and schedule another dose of iron .  Phone number supplied.

## 2024-04-28 ENCOUNTER — Telehealth: Payer: Self-pay | Admitting: *Deleted

## 2024-04-28 DIAGNOSIS — R1013 Epigastric pain: Secondary | ICD-10-CM | POA: Diagnosis not present

## 2024-04-28 NOTE — Telephone Encounter (Signed)
 Multiple attempts have been made to reschedule iron  infusions without success at this point.  Messages have been left for callback.

## 2024-05-06 ENCOUNTER — Inpatient Hospital Stay

## 2024-05-06 VITALS — BP 139/68 | HR 60 | Temp 97.0°F | Resp 18

## 2024-05-06 DIAGNOSIS — N183 Chronic kidney disease, stage 3 unspecified: Secondary | ICD-10-CM

## 2024-05-06 DIAGNOSIS — E538 Deficiency of other specified B group vitamins: Secondary | ICD-10-CM | POA: Diagnosis not present

## 2024-05-06 DIAGNOSIS — N1831 Chronic kidney disease, stage 3a: Secondary | ICD-10-CM | POA: Diagnosis not present

## 2024-05-06 DIAGNOSIS — E611 Iron deficiency: Secondary | ICD-10-CM | POA: Diagnosis not present

## 2024-05-06 DIAGNOSIS — D649 Anemia, unspecified: Secondary | ICD-10-CM

## 2024-05-06 DIAGNOSIS — D631 Anemia in chronic kidney disease: Secondary | ICD-10-CM | POA: Diagnosis not present

## 2024-05-06 DIAGNOSIS — D696 Thrombocytopenia, unspecified: Secondary | ICD-10-CM

## 2024-05-06 DIAGNOSIS — D500 Iron deficiency anemia secondary to blood loss (chronic): Secondary | ICD-10-CM

## 2024-05-06 LAB — CBC
HCT: 33.1 % — ABNORMAL LOW (ref 39.0–52.0)
Hemoglobin: 10 g/dL — ABNORMAL LOW (ref 13.0–17.0)
MCH: 28.7 pg (ref 26.0–34.0)
MCHC: 30.2 g/dL (ref 30.0–36.0)
MCV: 94.8 fL (ref 80.0–100.0)
Platelets: 256 10*3/uL (ref 150–400)
RBC: 3.49 MIL/uL — ABNORMAL LOW (ref 4.22–5.81)
RDW: 18 % — ABNORMAL HIGH (ref 11.5–15.5)
WBC: 5.6 10*3/uL (ref 4.0–10.5)
nRBC: 0 % (ref 0.0–0.2)

## 2024-05-06 MED ORDER — EPOETIN ALFA 40000 UNIT/ML IJ SOLN
40000.0000 [IU] | Freq: Once | INTRAMUSCULAR | Status: AC
  Administered 2024-05-06: 40000 [IU] via SUBCUTANEOUS
  Filled 2024-05-06: qty 1

## 2024-05-06 MED ORDER — CYANOCOBALAMIN 1000 MCG/ML IJ SOLN
1000.0000 ug | Freq: Once | INTRAMUSCULAR | Status: AC
Start: 2024-05-06 — End: 2024-05-06
  Administered 2024-05-06: 1000 ug via INTRAMUSCULAR
  Filled 2024-05-06: qty 1

## 2024-05-06 NOTE — Patient Instructions (Signed)

## 2024-05-06 NOTE — Progress Notes (Signed)
 Patient tolerated injection with no complaints voiced.  Site clean and dry with no bruising or swelling noted at site.  See MAR for details.  Band aid applied.  Patient stable during and after injection.  Vss with discharge and left in satisfactory condition with no s/s of distress noted.

## 2024-05-27 ENCOUNTER — Inpatient Hospital Stay

## 2024-05-27 ENCOUNTER — Inpatient Hospital Stay: Attending: Hematology

## 2024-05-27 VITALS — BP 158/67 | HR 69 | Temp 98.6°F | Resp 16

## 2024-05-27 DIAGNOSIS — N183 Chronic kidney disease, stage 3 unspecified: Secondary | ICD-10-CM

## 2024-05-27 DIAGNOSIS — E538 Deficiency of other specified B group vitamins: Secondary | ICD-10-CM | POA: Diagnosis not present

## 2024-05-27 DIAGNOSIS — N1831 Chronic kidney disease, stage 3a: Secondary | ICD-10-CM | POA: Insufficient documentation

## 2024-05-27 DIAGNOSIS — D631 Anemia in chronic kidney disease: Secondary | ICD-10-CM | POA: Insufficient documentation

## 2024-05-27 DIAGNOSIS — D500 Iron deficiency anemia secondary to blood loss (chronic): Secondary | ICD-10-CM | POA: Diagnosis not present

## 2024-05-27 DIAGNOSIS — D649 Anemia, unspecified: Secondary | ICD-10-CM

## 2024-05-27 DIAGNOSIS — D696 Thrombocytopenia, unspecified: Secondary | ICD-10-CM

## 2024-05-27 LAB — CBC
HCT: 33.3 % — ABNORMAL LOW (ref 39.0–52.0)
Hemoglobin: 10.4 g/dL — ABNORMAL LOW (ref 13.0–17.0)
MCH: 28.5 pg (ref 26.0–34.0)
MCHC: 31.2 g/dL (ref 30.0–36.0)
MCV: 91.2 fL (ref 80.0–100.0)
Platelets: 244 10*3/uL (ref 150–400)
RBC: 3.65 MIL/uL — ABNORMAL LOW (ref 4.22–5.81)
RDW: 16.6 % — ABNORMAL HIGH (ref 11.5–15.5)
WBC: 4.7 10*3/uL (ref 4.0–10.5)
nRBC: 0 % (ref 0.0–0.2)

## 2024-05-27 MED ORDER — CYANOCOBALAMIN 1000 MCG/ML IJ SOLN
1000.0000 ug | Freq: Once | INTRAMUSCULAR | Status: AC
  Administered 2024-05-27: 1000 ug via INTRAMUSCULAR
  Filled 2024-05-27: qty 1

## 2024-05-27 MED ORDER — EPOETIN ALFA 40000 UNIT/ML IJ SOLN
40000.0000 [IU] | Freq: Once | INTRAMUSCULAR | Status: AC
  Administered 2024-05-27: 40000 [IU] via SUBCUTANEOUS
  Filled 2024-05-27: qty 1

## 2024-05-27 NOTE — Patient Instructions (Signed)
 [facility] [place] - A DEPT OF [facility]  Discharge Instructions: Thank you for choosing [facility] to provide your oncology and hematology care.  If you have a lab appointment with the Cancer Center - please note that after April 8th, 2024, all labs will be drawn in the cancer center.  You do not have to check in or register with the main entrance as you have in the past but will complete your check-in in the cancer center.  Wear comfortable clothing and clothing appropriate for easy access to any Portacath or PICC line.   We strive to give you quality time with your provider. You may need to reschedule your appointment if you arrive late (15 or more minutes).  Arriving late affects you and other patients whose appointments are after yours.  Also, if you miss three or more appointments without notifying the office, you may be dismissed from the clinic at the provider's discretion.      For prescription refill requests, have your pharmacy contact our office and allow 72 hours for refills to be completed.    Today you received Vitamin B12  and Epogen  40,000 unit injections     BELOW ARE SYMPTOMS THAT SHOULD BE REPORTED IMMEDIATELY: *FEVER GREATER THAN 100.4 F (38 C) OR HIGHER *CHILLS OR SWEATING *NAUSEA AND VOMITING THAT IS NOT CONTROLLED WITH YOUR NAUSEA MEDICATION *UNUSUAL SHORTNESS OF BREATH *UNUSUAL BRUISING OR BLEEDING *URINARY PROBLEMS (pain or burning when urinating, or frequent urination) *BOWEL PROBLEMS (unusual diarrhea, constipation, pain near the anus) TENDERNESS IN MOUTH AND THROAT WITH OR WITHOUT PRESENCE OF ULCERS (sore throat, sores in mouth, or a toothache) UNUSUAL RASH, SWELLING OR PAIN  UNUSUAL VAGINAL DISCHARGE OR ITCHING   Items with * indicate a potential emergency and should be followed up as soon as possible or go to the Emergency Department if any problems should occur.  Please show the CHEMOTHERAPY ALERT CARD or  IMMUNOTHERAPY ALERT CARD at check-in to the Emergency Department and triage nurse.  Should you have questions after your visit or need to cancel or reschedule your appointment, please contact [facility] [place] - A DEPT OF [facility] [phone]  and follow the prompts.  Office hours are 8:00 a.m. to 4:30 p.m. Monday - Friday. Please note that voicemails left after 4:00 p.m. may not be returned until the following business day.  We are closed weekends and major holidays. You have access to a nurse at all times for urgent questions. Please call the main number to the clinic [phone] and follow the prompts.  For any non-urgent questions, you may also contact your provider using MyChart. We now offer e-Visits for anyone 38 and older to request care online for non-urgent symptoms. For details visit [email or web address].   Also download the MyChart app! Go to the app store, search MyChart, open the app, select [place], and log in with your MyChart username and password.

## 2024-05-27 NOTE — Progress Notes (Signed)
 Roger Hansen presents today for injection per the provider's orders.  Epogen  40,000U and B12 administration without incident; injection site WNL; see MAR for injection details.  Patient tolerated procedure well and without incident.  No questions or complaints noted at this time. Patient's hemoglobin noted to be 10.4 today.  Discharged from clinic ambulatory in stable condition. Alert and oriented x 3. F/U with [facility] as scheduled.

## 2024-05-30 ENCOUNTER — Ambulatory Visit (INDEPENDENT_AMBULATORY_CARE_PROVIDER_SITE_OTHER): Payer: Medicare Other | Admitting: Gastroenterology

## 2024-06-16 ENCOUNTER — Inpatient Hospital Stay: Attending: Hematology

## 2024-06-16 ENCOUNTER — Inpatient Hospital Stay

## 2024-06-16 VITALS — BP 137/75 | HR 64 | Temp 98.4°F | Resp 16

## 2024-06-16 DIAGNOSIS — D696 Thrombocytopenia, unspecified: Secondary | ICD-10-CM

## 2024-06-16 DIAGNOSIS — D631 Anemia in chronic kidney disease: Secondary | ICD-10-CM | POA: Insufficient documentation

## 2024-06-16 DIAGNOSIS — D649 Anemia, unspecified: Secondary | ICD-10-CM

## 2024-06-16 DIAGNOSIS — N1831 Chronic kidney disease, stage 3a: Secondary | ICD-10-CM | POA: Diagnosis not present

## 2024-06-16 DIAGNOSIS — E538 Deficiency of other specified B group vitamins: Secondary | ICD-10-CM | POA: Diagnosis not present

## 2024-06-16 DIAGNOSIS — N183 Chronic kidney disease, stage 3 unspecified: Secondary | ICD-10-CM

## 2024-06-16 DIAGNOSIS — D500 Iron deficiency anemia secondary to blood loss (chronic): Secondary | ICD-10-CM

## 2024-06-16 LAB — CBC
HCT: 31.7 % — ABNORMAL LOW (ref 39.0–52.0)
Hemoglobin: 9.5 g/dL — ABNORMAL LOW (ref 13.0–17.0)
MCH: 27.9 pg (ref 26.0–34.0)
MCHC: 30 g/dL (ref 30.0–36.0)
MCV: 93 fL (ref 80.0–100.0)
Platelets: 179 10*3/uL (ref 150–400)
RBC: 3.41 MIL/uL — ABNORMAL LOW (ref 4.22–5.81)
RDW: 17.2 % — ABNORMAL HIGH (ref 11.5–15.5)
WBC: 4 10*3/uL (ref 4.0–10.5)
nRBC: 0 % (ref 0.0–0.2)

## 2024-06-16 MED ORDER — CYANOCOBALAMIN 1000 MCG/ML IJ SOLN
1000.0000 ug | Freq: Once | INTRAMUSCULAR | Status: AC
  Administered 2024-06-16: 1000 ug via INTRAMUSCULAR
  Filled 2024-06-16: qty 1

## 2024-06-16 MED ORDER — EPOETIN ALFA 40000 UNIT/ML IJ SOLN
40000.0000 [IU] | Freq: Once | INTRAMUSCULAR | Status: AC
  Administered 2024-06-16: 40000 [IU] via SUBCUTANEOUS
  Filled 2024-06-16: qty 1

## 2024-06-16 NOTE — Progress Notes (Signed)
 Roger Hansen presents today for injection per the provider's orders.  Retacrit  and B12 administration without incident; injection site WNL; see MAR for injection details.  Patient tolerated procedure well and without incident.  No questions or complaints noted at this time. Patient's hemoglobin noted to be 9.5 today.  Discharged from clinic ambulatory in stable condition. Alert and oriented x 3. F/U with [facility] as scheduled.

## 2024-06-16 NOTE — Patient Instructions (Signed)
 [facility] [place] - A DEPT OF [facility]  Discharge Instructions: Thank you for choosing [facility] to provide your oncology and hematology care.  If you have a lab appointment with the Cancer Center - please note that after April 8th, 2024, all labs will be drawn in the cancer center.  You do not have to check in or register with the main entrance as you have in the past but will complete your check-in in the cancer center.  Wear comfortable clothing and clothing appropriate for easy access to any Portacath or PICC line.   We strive to give you quality time with your provider. You may need to reschedule your appointment if you arrive late (15 or more minutes).  Arriving late affects you and other patients whose appointments are after yours.  Also, if you miss three or more appointments without notifying the office, you may be dismissed from the clinic at the provider's discretion.      For prescription refill requests, have your pharmacy contact our office and allow 72 hours for refills to be completed.    Today you received Retacrit /B12 injections.     BELOW ARE SYMPTOMS THAT SHOULD BE REPORTED IMMEDIATELY: *FEVER GREATER THAN 100.4 F (38 C) OR HIGHER *CHILLS OR SWEATING *NAUSEA AND VOMITING THAT IS NOT CONTROLLED WITH YOUR NAUSEA MEDICATION *UNUSUAL SHORTNESS OF BREATH *UNUSUAL BRUISING OR BLEEDING *URINARY PROBLEMS (pain or burning when urinating, or frequent urination) *BOWEL PROBLEMS (unusual diarrhea, constipation, pain near the anus) TENDERNESS IN MOUTH AND THROAT WITH OR WITHOUT PRESENCE OF ULCERS (sore throat, sores in mouth, or a toothache) UNUSUAL RASH, SWELLING OR PAIN  UNUSUAL VAGINAL DISCHARGE OR ITCHING   Items with * indicate a potential emergency and should be followed up as soon as possible or go to the Emergency Department if any problems should occur.  Please show the CHEMOTHERAPY ALERT CARD or IMMUNOTHERAPY ALERT CARD at  check-in to the Emergency Department and triage nurse.  Should you have questions after your visit or need to cancel or reschedule your appointment, please contact [facility] [place] - A DEPT OF [facility] [phone]  and follow the prompts.  Office hours are 8:00 a.m. to 4:30 p.m. Monday - Friday. Please note that voicemails left after 4:00 p.m. may not be returned until the following business day.  We are closed weekends and major holidays. You have access to a nurse at all times for urgent questions. Please call the main number to the clinic [phone] and follow the prompts.  For any non-urgent questions, you may also contact your provider using MyChart. We now offer e-Visits for anyone 59 and older to request care online for non-urgent symptoms. For details visit [email or web address].   Also download the MyChart app! Go to the app store, search MyChart, open the app, select [place], and log in with your MyChart username and password.

## 2024-07-07 ENCOUNTER — Inpatient Hospital Stay

## 2024-07-07 ENCOUNTER — Inpatient Hospital Stay (HOSPITAL_BASED_OUTPATIENT_CLINIC_OR_DEPARTMENT_OTHER): Admitting: Oncology

## 2024-07-07 VITALS — BP 124/61 | HR 84 | Temp 98.5°F | Resp 18 | Wt 182.1 lb

## 2024-07-07 DIAGNOSIS — N189 Chronic kidney disease, unspecified: Secondary | ICD-10-CM

## 2024-07-07 DIAGNOSIS — D500 Iron deficiency anemia secondary to blood loss (chronic): Secondary | ICD-10-CM

## 2024-07-07 DIAGNOSIS — N183 Chronic kidney disease, stage 3 unspecified: Secondary | ICD-10-CM

## 2024-07-07 DIAGNOSIS — E538 Deficiency of other specified B group vitamins: Secondary | ICD-10-CM | POA: Insufficient documentation

## 2024-07-07 DIAGNOSIS — N1831 Chronic kidney disease, stage 3a: Secondary | ICD-10-CM | POA: Diagnosis not present

## 2024-07-07 DIAGNOSIS — D631 Anemia in chronic kidney disease: Secondary | ICD-10-CM

## 2024-07-07 DIAGNOSIS — D649 Anemia, unspecified: Secondary | ICD-10-CM

## 2024-07-07 LAB — IRON AND TIBC
Iron: 79 ug/dL (ref 45–182)
Saturation Ratios: 35 % (ref 17.9–39.5)
TIBC: 227 ug/dL — ABNORMAL LOW (ref 250–450)
UIBC: 148 ug/dL

## 2024-07-07 LAB — CBC WITH DIFFERENTIAL/PLATELET
Abs Immature Granulocytes: 0.01 K/uL (ref 0.00–0.07)
Basophils Absolute: 0 K/uL (ref 0.0–0.1)
Basophils Relative: 1 %
Eosinophils Absolute: 0.1 K/uL (ref 0.0–0.5)
Eosinophils Relative: 2 %
HCT: 30.7 % — ABNORMAL LOW (ref 39.0–52.0)
Hemoglobin: 9.7 g/dL — ABNORMAL LOW (ref 13.0–17.0)
Immature Granulocytes: 0 %
Lymphocytes Relative: 38 %
Lymphs Abs: 1.9 K/uL (ref 0.7–4.0)
MCH: 28.8 pg (ref 26.0–34.0)
MCHC: 31.6 g/dL (ref 30.0–36.0)
MCV: 91.1 fL (ref 80.0–100.0)
Monocytes Absolute: 0.6 K/uL (ref 0.1–1.0)
Monocytes Relative: 12 %
Neutro Abs: 2.4 K/uL (ref 1.7–7.7)
Neutrophils Relative %: 47 %
Platelets: 204 K/uL (ref 150–400)
RBC: 3.37 MIL/uL — ABNORMAL LOW (ref 4.22–5.81)
RDW: 17 % — ABNORMAL HIGH (ref 11.5–15.5)
WBC: 5.1 K/uL (ref 4.0–10.5)
nRBC: 0 % (ref 0.0–0.2)

## 2024-07-07 LAB — COMPREHENSIVE METABOLIC PANEL WITH GFR
ALT: 11 U/L (ref 0–44)
AST: 21 U/L (ref 15–41)
Albumin: 3.8 g/dL (ref 3.5–5.0)
Alkaline Phosphatase: 57 U/L (ref 38–126)
Anion gap: 10 (ref 5–15)
BUN: 23 mg/dL (ref 8–23)
CO2: 28 mmol/L (ref 22–32)
Calcium: 9.2 mg/dL (ref 8.9–10.3)
Chloride: 96 mmol/L — ABNORMAL LOW (ref 98–111)
Creatinine, Ser: 2.02 mg/dL — ABNORMAL HIGH (ref 0.61–1.24)
GFR, Estimated: 34 mL/min — ABNORMAL LOW (ref >60)
Glucose, Bld: 110 mg/dL — ABNORMAL HIGH (ref 70–99)
Potassium: 3.4 mmol/L — ABNORMAL LOW (ref 3.5–5.1)
Sodium: 134 mmol/L — ABNORMAL LOW (ref 135–145)
Total Bilirubin: 0.9 mg/dL (ref 0.0–1.2)
Total Protein: 6.9 g/dL (ref 6.5–8.1)

## 2024-07-07 LAB — VITAMIN B12: Vitamin B-12: 540 pg/mL (ref 180–914)

## 2024-07-07 LAB — FERRITIN: Ferritin: 535 ng/mL — ABNORMAL HIGH (ref 24–336)

## 2024-07-07 MED ORDER — EPOETIN ALFA 40000 UNIT/ML IJ SOLN
40000.0000 [IU] | Freq: Once | INTRAMUSCULAR | Status: AC
  Administered 2024-07-07: 40000 [IU] via SUBCUTANEOUS
  Filled 2024-07-07: qty 1

## 2024-07-07 MED ORDER — CYANOCOBALAMIN 1000 MCG/ML IJ SOLN
1000.0000 ug | Freq: Once | INTRAMUSCULAR | Status: AC
  Administered 2024-07-07: 1000 ug via INTRAMUSCULAR
  Filled 2024-07-07: qty 1

## 2024-07-07 NOTE — Patient Instructions (Signed)
 [facility] [place] - A DEPT OF [facility]  Discharge Instructions: Thank you for choosing [facility] to provide your oncology and hematology care.  If you have a lab appointment with the Cancer Center - please note that after April 8th, 2024, all labs will be drawn in the cancer center.  You do not have to check in or register with the main entrance as you have in the past but will complete your check-in in the cancer center.  Wear comfortable clothing and clothing appropriate for easy access to any Portacath or PICC line.   We strive to give you quality time with your provider. You may need to reschedule your appointment if you arrive late (15 or more minutes).  Arriving late affects you and other patients whose appointments are after yours.  Also, if you miss three or more appointments without notifying the office, you may be dismissed from the clinic at the provider's discretion.      For prescription refill requests, have your pharmacy contact our office and allow 72 hours for refills to be completed.    Today you received the following:  Retacrit/Vitamin B12.  Epoetin Alfa Injection What is this medication? EPOETIN ALFA (e POE e tin AL fa) treats low levels of red blood cells (anemia) caused by kidney disease, chemotherapy, or HIV medications. It can also be used in people who are at risk for blood loss during surgery. It works by Systems analyst make more red blood cells, which reduces the need for blood transfusions. This medicine may be used for other purposes; ask your health care provider or pharmacist if you have questions. COMMON BRAND NAME(S): Epogen, Procrit, Retacrit What should I tell my care team before I take this medication? They need to know if you have any of these conditions: Blood clots Cancer Heart disease High blood pressure On dialysis Seizures Stroke An unusual or allergic reaction to epoetin alfa, albumin, benzyl  alcohol, other medications, foods, dyes, or preservatives Pregnant or trying to get pregnant Breast-feeding How should I use this medication? This medication is injected into a vein or under the skin. It is usually given by your care team in a hospital or clinic setting. It may also be given at home. If you get this medication at home, you will be taught how to prepare and give it. Use exactly as directed. Take it as directed on the prescription label at the same time every day. Keep taking it unless your care team tells you to stop. It is important that you put your used needles and syringes in a special sharps container. Do not put them in a trash can. If you do not have a sharps container, call your pharmacist or care team to get one. A special MedGuide will be given to you by the pharmacist with each prescription and refill. Be sure to read this information carefully each time. Talk to your care team about the use of this medication in children. While this medication may be used in children as young as 1 month of age for selected conditions, precautions do apply. Overdosage: If you think you have taken too much of this medicine contact a poison control center or emergency room at once. NOTE: This medicine is only for you. Do not share this medicine with others. What if I miss a dose? If you miss a dose, take it as soon as you can. If it is almost time for your  next dose, take only that dose. Do not take double or extra doses. What may interact with this medication? Darbepoetin alfa Methoxy polyethylene glycol-epoetin beta This list may not describe all possible interactions. Give your health care provider a list of all the medicines, herbs, non-prescription drugs, or dietary supplements you use. Also tell them if you smoke, drink alcohol, or use illegal drugs. Some items may interact with your medicine. What should I watch for while using this medication? Visit your care team for regular checks  on your progress. Check your blood pressure as directed. Know what your blood pressure should be and when to contact your care team. Your condition will be monitored carefully while you are receiving this medication. You may need blood work while taking this medication. What side effects may I notice from receiving this medication? Side effects that you should report to your care team as soon as possible: Allergic reactions--skin rash, itching, hives, swelling of the face, lips, tongue, or throat Blood clot--pain, swelling, or warmth in the leg, shortness of breath, chest pain Heart attack--pain or tightness in the chest, shoulders, arms, or jaw, nausea, shortness of breath, cold or clammy skin, feeling faint or lightheaded Increase in blood pressure Rash, fever, and swollen lymph nodes Redness, blistering, peeling, or loosening of the skin, including inside the mouth Seizures Stroke--sudden numbness or weakness of the face, arm, or leg, trouble speaking, confusion, trouble walking, loss of balance or coordination, dizziness, severe headache, change in vision Side effects that usually do not require medical attention (report to your care team if they continue or are bothersome): Bone, joint, or muscle pain Cough Headache Nausea Pain, redness, or irritation at injection site This list may not describe all possible side effects. Call your doctor for medical advice about side effects. You may report side effects to FDA at 1-800-FDA-1088. Where should I keep my medication? Keep out of the reach of children and pets. Store in a refrigerator. Do not freeze. Do not shake. Protect from light. Keep this medication in the original container until you are ready to take it. See product for storage information. Get rid of any unused medication after the expiration date. To get rid of medications that are no longer needed or have expired: Take the medication to a medication take-back program. Check with  your pharmacy or law enforcement to find a location. If you cannot return the medication, ask your pharmacist or care team how to get rid of the medication safely. NOTE: This sheet is a summary. It may not cover all possible information. If you have questions about this medicine, talk to your doctor, pharmacist, or health care provider.  2024 Elsevier/Gold Standard (2022-04-04 00:00:00)    Vitamin B12 Injection What is this medication? Vitamin B12 (VAHY tuh min B12) prevents and treats low vitamin B12 levels in your body. It is used in people who do not get enough vitamin B12 from their diet or when their digestive tract does not absorb enough. Vitamin B12 plays an important role in maintaining the health of your nervous system and red blood cells. This medicine may be used for other purposes; ask your health care provider or pharmacist if you have questions. COMMON BRAND NAME(S): B-12 Compliance Kit, B-12 Injection Kit, Cyomin, Dodex, LA-12, Nutri-Twelve, Physicians EZ Use B-12, Primabalt, Vitamin Deficiency Injectable System - B12 What should I tell my care team before I take this medication? They need to know if you have any of these conditions: Kidney disease Leber's disease Megaloblastic  anemia An unusual or allergic reaction to cyanocobalamin, cobalt, other medications, foods, dyes, or preservatives Pregnant or trying to get pregnant Breast-feeding How should I use this medication? This medication is injected into a muscle or deeply under the skin. It is usually given in a clinic or care team's office. However, your care team may teach you how to inject yourself. Follow all instructions. Talk to your care team about the use of this medication in children. Special care may be needed. Overdosage: If you think you have taken too much of this medicine contact a poison control center or emergency room at once. NOTE: This medicine is only for you. Do not share this medicine with  others. What if I miss a dose? If you are given your dose at a clinic or care team's office, call to reschedule your appointment. If you give your own injections, and you miss a dose, take it as soon as you can. If it is almost time for your next dose, take only that dose. Do not take double or extra doses. What may interact with this medication? Alcohol Colchicine This list may not describe all possible interactions. Give your health care provider a list of all the medicines, herbs, non-prescription drugs, or dietary supplements you use. Also tell them if you smoke, drink alcohol, or use illegal drugs. Some items may interact with your medicine. What should I watch for while using this medication? Visit your care team regularly. You may need blood work done while you are taking this medication. You may need to follow a special diet. Talk to your care team. Limit your alcohol intake and avoid smoking to get the best benefit. What side effects may I notice from receiving this medication? Side effects that you should report to your care team as soon as possible: Allergic reactions--skin rash, itching, hives, swelling of the face, lips, tongue, or throat Swelling of the ankles, hands, or feet Trouble breathing Side effects that usually do not require medical attention (report to your care team if they continue or are bothersome): Diarrhea This list may not describe all possible side effects. Call your doctor for medical advice about side effects. You may report side effects to FDA at 1-800-FDA-1088. Where should I keep my medication? Keep out of the reach of children. Store at room temperature between 15 and 30 degrees C (59 and 85 degrees F). Protect from light. Throw away any unused medication after the expiration date. NOTE: This sheet is a summary. It may not cover all possible information. If you have questions about this medicine, talk to your doctor, pharmacist, or health care provider.   2024 Elsevier/Gold Standard (2021-08-13 00:00:00)     To help prevent nausea and vomiting after your treatment, we encourage you to take your nausea medication as directed.  BELOW ARE SYMPTOMS THAT SHOULD BE REPORTED IMMEDIATELY: *FEVER GREATER THAN 100.4 F (38 C) OR HIGHER *CHILLS OR SWEATING *NAUSEA AND VOMITING THAT IS NOT CONTROLLED WITH YOUR NAUSEA MEDICATION *UNUSUAL SHORTNESS OF BREATH *UNUSUAL BRUISING OR BLEEDING *URINARY PROBLEMS (pain or burning when urinating, or frequent urination) *BOWEL PROBLEMS (unusual diarrhea, constipation, pain near the anus) TENDERNESS IN MOUTH AND THROAT WITH OR WITHOUT PRESENCE OF ULCERS (sore throat, sores in mouth, or a toothache) UNUSUAL RASH, SWELLING OR PAIN  UNUSUAL VAGINAL DISCHARGE OR ITCHING   Items with * indicate a potential emergency and should be followed up as soon as possible or go to the Emergency Department if any problems should occur.  Please  show the CHEMOTHERAPY ALERT CARD or IMMUNOTHERAPY ALERT CARD at check-in to the Emergency Department and triage nurse.  Should you have questions after your visit or need to cancel or reschedule your appointment, please contact [facility]  - A DEPT OF [facility] [phone]  and follow the prompts.  Office hours are 8:00 a.m. to 4:30 p.m. Monday - Friday. Please note that voicemails left after 4:00 p.m. may not be returned until the following business day.  We are closed weekends and major holidays. You have access to a nurse at all times for urgent questions. Please call the main number to the clinic [phone] and follow the prompts.  For any non-urgent questions, you may also contact your provider using MyChart. We now offer e-Visits for anyone 31 and older to request care online for non-urgent symptoms. For details visit [email or web address].   Also download the MyChart app! Go to the app store, search "MyChart", open the app, select [place], and  log in with your MyChart username and password.

## 2024-07-07 NOTE — Progress Notes (Signed)
Patient tolerated Vitamin B12 and Retacrit injections with no complaints voiced.  Site clean and dry with no bruising or swelling noted.  No complaints of pain.  Discharged with vital signs stable and no signs or symptoms of distress noted.

## 2024-07-07 NOTE — Assessment & Plan Note (Addendum)
-  Labs from 04/15/2024 show an MMA of 362 and B12 level of 479. -Continue every 3-week B12 injections along with his Retacrit . -She will receive a B12 shot today.

## 2024-07-07 NOTE — Assessment & Plan Note (Signed)
-  He receives 40,000 units of Retacrit  every 3 weeks-last given on 06/16/2024. -Hemoglobin has ranged between 9 and 10.4 over the past few months. -Recommend we continue Retacrit  every 3 weeks.  Hemoglobin is stable.

## 2024-07-07 NOTE — Assessment & Plan Note (Addendum)
-  He receives 40,000 units of Retacrit  every 3 weeks-last given on 06/16/2024. -Hemoglobin has ranged between 9 and 10.4 over the past few months. -Recommend we continue Retacrit  every 3 weeks.  Hemoglobin is stable. -Proceed with Retacrit  today.

## 2024-07-07 NOTE — Progress Notes (Signed)
 [facility] OFFICE PROGRESS NOTE  Burdine, Elspeth BRAVO, MD  ASSESSMENT & PLAN:  Assessment & Plan Iron  deficiency anemia due to chronic blood loss - He continues to take iron  tablets 325 mg once daily.  We discussed given his stools have been very hard, trying iron  supplements every other day to see if this helps.  Continue Metamucil and stool softeners as needed. - He has required intermittent IV iron  (300 mg IV Venofer ) last given on 01/13/2024. - He reports intermittent black bowel movements.  He denies any bright red blood per rectum, hematemesis, or epistaxis.    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  He will be scheduled for a push enteroscopy in the near future. - Labs from 06/16/2024 show hemoglobin of 9.5 (10.4), MCV 93.0 and normal platelet count. -Most recent ferritin was 423, iron  saturations 12% and TIBC 267. -It was recommended he receive 2 doses of IV Venofer , but patient never returned phone call to clinic. -Iron  levels are pending from today but given levels were declining at his last visit, would recommend going ahead and scheduling 2 additional IV iron  infusions. -We discussed if he continues to have worsening anemia, repeat bone marrow biopsy would be reasonable. Anemia in chronic kidney disease, unspecified CKD stage -He receives 40,000 units of Retacrit  every 3 weeks-last given on 06/16/2024. -Hemoglobin has ranged between 9 and 10.4 over the past few months. -Recommend we continue Retacrit  every 3 weeks.  Hemoglobin is stable. -Proceed with Retacrit  today. Vitamin B12 deficiency disease -Labs from 04/15/2024 show an MMA of 362 and B12 level of 479. -Continue every 3-week B12 injections along with his Retacrit . -She will receive a B12 shot today. Anemia in stage 3a chronic kidney disease  (HCC) -He receives 40,000 units of Retacrit  every 3 weeks-last given on 06/16/2024. -Hemoglobin has ranged between 9 and 10.4 over the past few months. -Recommend we continue Retacrit  every 3 weeks.  Hemoglobin is stable. B12 deficiency  Anemia, unspecified type     Orders Placed This Encounter  Procedures   Methylmalonic acid, serum    Standing Status:   Future    Expected Date:   11/07/2024    Expiration Date:   02/05/2025   Vitamin B12    Standing Status:   Future    Expected Date:   11/07/2024    Expiration Date:   02/05/2025   Iron  and TIBC ([facility] ONLY)    Standing Status:   Future    Expected Date:   11/07/2024    Expiration Date:   02/05/2025   Ferritin    Standing Status:   Future    Expected Date:   11/07/2024    Expiration Date:   02/05/2025   Comprehensive metabolic panel    Standing Status:   Future    Expected Date:   11/07/2024    Expiration Date:   02/05/2025   CBC with Differential    Standing Status:   Future    Expected Date:   11/07/2024    Expiration Date:   02/05/2025    INTERVAL HISTORY: Patient returns for recurrent anemia secondary to iron  deficiency, B12 deficiency and CKD.  He currently receives 40,000 units Retacrit  and 1000 mcg's B12 every 3 weeks.  He receives IV iron  intermittently last given on 01/13/2024.  He continues to take oral iron   325 mg daily.  It was recommended at his last visit he get additional IV iron  but he never called the clinic back.  We discussed if hemoglobin continues to trend down despite adequate nutritional lab values, would recommend repeat bone marrow biopsy.  He denies any hospitalizations, surgeries or changes to his baseline health since he was last evaluated.  Reports he has been doing well since his last visit with us .  Appetite and energy levels are 100%.  He has 8 out of 10 pain in his back.  He has developed some shortness of breath with exertion starting around May.  Denies any chest pain or  palpitations.  Has occasional constipation and diarrhea.  Has dizziness and headaches intermittently.  Has occasional trouble sleeping.  He has not noticed any bleeding or bright red blood per rectum.  He does admit to canceling a GI procedure due to being unable to make the appointment.  He will reschedule in the near future.  He continues to take oral iron  supplements.  We reviewed most recent CBC.  SUMMARY OF HEMATOLOGIC HISTORY: Patient is a 78 year old male with past medical history significant for iron  deficiency, B12 deficiency, chronic kidney disease and thrombocytopenia.  He has had anemia since 2014 with a negative peripheral workup, negative bone marrow aspiration and biopsy and cytogenetics on 10/16/2016.  The bone marrow biopsy showed slightly hypercellular marrow with trilineage hematopoiesis chromosome analysis was normal.  MGUS/myeloma panel was negative.  Hemolysis workup was negative.  He has been receiving intermittent Epogen  and parenteral iron  since June 2020.  He is a Jehovah witness and does not receive blood products.  Most up-to-date EGD from 11/20/2022 shows erythematous gastric mucosa duodenitis and colonoscopy from 11/20/2022 showed single nonbleeding angiodysplastic lesion treated with APC, polyps x 4 pathogen benign, diverticulosis.  Lab Results  Component Value Date   HGB 9.7 (L) 07/07/2024   FERRITIN 423 (H) 04/15/2024   VITAMINB12 479 04/15/2024    Vitals:   07/07/24 1002  BP: 124/61  Pulse: 84  Resp: 18  Temp: 98.5 F (36.9 C)  SpO2: 100%    Review of Systems  Constitutional:  Positive for malaise/fatigue. Negative for weight loss.  Respiratory:  Positive for shortness of breath.   Cardiovascular:  Positive for chest pain.  Gastrointestinal:  Positive for constipation and diarrhea.  Neurological:  Positive for dizziness, sensory change and headaches.  Psychiatric/Behavioral:  The patient has insomnia.     Physical Exam Constitutional:       Appearance: Normal appearance.  Cardiovascular:     Rate and Rhythm: Normal rate and regular rhythm.  Pulmonary:     Effort: Pulmonary effort is normal.     Breath sounds: Normal breath sounds.  Abdominal:     General: Bowel sounds are normal.     Palpations: Abdomen is soft.  Musculoskeletal:        General: No swelling. Normal range of motion.  Neurological:     Mental Status: He is alert and oriented to person, place, and time. Mental status is at baseline.    I spent 35 minutes dedicated to the care of this patient (face-to-face and non-face-to-face) on the date of the encounter to include what is described in the assessment and plan.,  Delon Hope, NP 07/07/2024 10:29 AM

## 2024-07-07 NOTE — Assessment & Plan Note (Addendum)
-   He continues to take iron  tablets 325 mg once daily.  We discussed given his stools have been very hard, trying iron  supplements every other day to see if this helps.  Continue Metamucil and stool softeners as needed. - He has required intermittent IV iron  (300 mg IV Venofer ) last given on 01/13/2024. - He reports intermittent black bowel movements.  He denies any bright red blood per rectum, hematemesis, or epistaxis.    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  He will be scheduled for a push enteroscopy in the near future. - Labs from 06/16/2024 show hemoglobin of 9.5 (10.4), MCV 93.0 and normal platelet count. -Most recent ferritin was 423, iron  saturations 12% and TIBC 267. -It was recommended he receive 2 doses of IV Venofer , but patient never returned phone call to clinic. -Iron  levels are pending from today but given levels were declining at his last visit, would recommend going ahead and scheduling 2 additional IV iron  infusions. -We discussed if he continues to have worsening anemia, repeat bone marrow biopsy would be reasonable.

## 2024-07-10 LAB — METHYLMALONIC ACID, SERUM: Methylmalonic Acid, Quantitative: 451 nmol/L — ABNORMAL HIGH (ref 0–378)

## 2024-07-14 ENCOUNTER — Inpatient Hospital Stay

## 2024-07-14 VITALS — BP 109/69 | HR 66 | Temp 97.4°F | Resp 18

## 2024-07-14 DIAGNOSIS — D631 Anemia in chronic kidney disease: Secondary | ICD-10-CM

## 2024-07-14 DIAGNOSIS — N183 Chronic kidney disease, stage 3 unspecified: Secondary | ICD-10-CM

## 2024-07-14 DIAGNOSIS — N1831 Chronic kidney disease, stage 3a: Secondary | ICD-10-CM | POA: Diagnosis not present

## 2024-07-14 DIAGNOSIS — E538 Deficiency of other specified B group vitamins: Secondary | ICD-10-CM | POA: Diagnosis not present

## 2024-07-14 DIAGNOSIS — D500 Iron deficiency anemia secondary to blood loss (chronic): Secondary | ICD-10-CM

## 2024-07-14 MED ORDER — SODIUM CHLORIDE 0.9 % IV SOLN
INTRAVENOUS | Status: DC
Start: 2024-07-14 — End: 2024-07-14

## 2024-07-14 MED ORDER — SODIUM CHLORIDE 0.9 % IV SOLN
400.0000 mg | Freq: Once | INTRAVENOUS | Status: AC
  Administered 2024-07-14: 400 mg via INTRAVENOUS
  Filled 2024-07-14: qty 400

## 2024-07-14 MED ORDER — ACETAMINOPHEN 325 MG PO TABS
650.0000 mg | ORAL_TABLET | Freq: Once | ORAL | Status: AC
  Administered 2024-07-14: 650 mg via ORAL
  Filled 2024-07-14: qty 2

## 2024-07-14 MED ORDER — CETIRIZINE HCL 10 MG/ML IV SOLN
10.0000 mg | Freq: Once | INTRAVENOUS | Status: AC
  Administered 2024-07-14: 10 mg via INTRAVENOUS
  Filled 2024-07-14: qty 1

## 2024-07-14 NOTE — Patient Instructions (Signed)
 [facility] [place] - A DEPT OF [facility]  Discharge Instructions: Thank you for choosing [facility] to provide your oncology and hematology care.  If you have a lab appointment with the Cancer Center - please note that after April 8th, 2024, all labs will be drawn in the cancer center.  You do not have to check in or register with the main entrance as you have in the past but will complete your check-in in the cancer center.  Wear comfortable clothing and clothing appropriate for easy access to any Portacath or PICC line.   We strive to give you quality time with your provider. You may need to reschedule your appointment if you arrive late (15 or more minutes).  Arriving late affects you and other patients whose appointments are after yours.  Also, if you miss three or more appointments without notifying the office, you may be dismissed from the clinic at the provider's discretion.      For prescription refill requests, have your pharmacy contact our office and allow 72 hours for refills to be completed.    Today you received the following venofer, return as scheduled.   To help prevent nausea and vomiting after your treatment, we encourage you to take your nausea medication as directed.  BELOW ARE SYMPTOMS THAT SHOULD BE REPORTED IMMEDIATELY: *FEVER GREATER THAN 100.4 F (38 C) OR HIGHER *CHILLS OR SWEATING *NAUSEA AND VOMITING THAT IS NOT CONTROLLED WITH YOUR NAUSEA MEDICATION *UNUSUAL SHORTNESS OF BREATH *UNUSUAL BRUISING OR BLEEDING *URINARY PROBLEMS (pain or burning when urinating, or frequent urination) *BOWEL PROBLEMS (unusual diarrhea, constipation, pain near the anus) TENDERNESS IN MOUTH AND THROAT WITH OR WITHOUT PRESENCE OF ULCERS (sore throat, sores in mouth, or a toothache) UNUSUAL RASH, SWELLING OR PAIN  UNUSUAL VAGINAL DISCHARGE OR ITCHING   Items with * indicate a potential emergency and should be followed up as soon as possible or  go to the Emergency Department if any problems should occur.  Please show the CHEMOTHERAPY ALERT CARD or IMMUNOTHERAPY ALERT CARD at check-in to the Emergency Department and triage nurse.  Should you have questions after your visit or need to cancel or reschedule your appointment, please contact [facility]CER CTR [place] - A DEPT OF [facility]ide[facility] [phone]  and follow the prompts.  Office hours are 8:00 a.m. to 4:30 p.m. Monday - Friday. Please note that voicemails left after 4:00 p.m. may not be returned until the following business day.  We are closed weekends and major holidays. You have access to a nurse at all times for urgent questions. Please call the main number to the clinic [phone] and follow the prompts.  For any non-urgent questions, you may also contact your provider using MyChart. We now offer e-Visits for anyone 13 and older to request care online for non-urgent symptoms. For details visit [email or web address].   Also download the MyChart app! Go to the app store, search "MyChart", open the app, select , and log in with your MyChart username and password.

## 2024-07-14 NOTE — Progress Notes (Signed)
 Patient tolerated iron infusion with no complaints voiced.  Peripheral IV site clean and dry with good blood return noted before and after infusion.  Band aid applied.  VSS with discharge and left in satisfactory condition with no s/s of distress noted.

## 2024-07-15 ENCOUNTER — Ambulatory Visit (HOSPITAL_COMMUNITY)

## 2024-07-21 ENCOUNTER — Inpatient Hospital Stay: Attending: Hematology

## 2024-07-21 VITALS — BP 138/70 | HR 60 | Temp 97.8°F | Resp 18

## 2024-07-21 DIAGNOSIS — D631 Anemia in chronic kidney disease: Secondary | ICD-10-CM | POA: Insufficient documentation

## 2024-07-21 DIAGNOSIS — N1831 Chronic kidney disease, stage 3a: Secondary | ICD-10-CM | POA: Diagnosis not present

## 2024-07-21 DIAGNOSIS — E538 Deficiency of other specified B group vitamins: Secondary | ICD-10-CM | POA: Diagnosis not present

## 2024-07-21 DIAGNOSIS — D500 Iron deficiency anemia secondary to blood loss (chronic): Secondary | ICD-10-CM | POA: Insufficient documentation

## 2024-07-21 DIAGNOSIS — N183 Chronic kidney disease, stage 3 unspecified: Secondary | ICD-10-CM

## 2024-07-21 MED ORDER — SODIUM CHLORIDE 0.9 % IV SOLN
INTRAVENOUS | Status: DC
Start: 2024-07-21 — End: 2024-07-21

## 2024-07-21 MED ORDER — ACETAMINOPHEN 325 MG PO TABS
650.0000 mg | ORAL_TABLET | Freq: Once | ORAL | Status: AC
  Administered 2024-07-21: 650 mg via ORAL
  Filled 2024-07-21: qty 2

## 2024-07-21 MED ORDER — CETIRIZINE HCL 10 MG/ML IV SOLN
10.0000 mg | Freq: Once | INTRAVENOUS | Status: AC
  Administered 2024-07-21: 10 mg via INTRAVENOUS
  Filled 2024-07-21: qty 1

## 2024-07-21 MED ORDER — SODIUM CHLORIDE 0.9 % IV SOLN
400.0000 mg | Freq: Once | INTRAVENOUS | Status: AC
  Administered 2024-07-21: 400 mg via INTRAVENOUS
  Filled 2024-07-21: qty 400

## 2024-07-21 NOTE — Patient Instructions (Signed)
 [facility] [place] - [facility]  Discharge Instructions: Thank you for choosing [facility] to provide your oncology and hematology care.  If you have a lab appointment with the Cancer Center - please note that after April 8th, 2024, all labs will be drawn in the cancer center.  You do not have to check in or register with the main entrance as you have in the past but will complete your check-in in the cancer center.  Wear comfortable clothing and clothing appropriate for easy access to any Portacath or PICC line.   We strive to give you quality time with your provider. You may need to reschedule your appointment if you arrive late (15 or more minutes).  Arriving late affects you and other patients whose appointments are after yours.  Also, if you miss three or more appointments without notifying the office, you may be dismissed from the clinic at the provider's discretion.      For prescription refill requests, have your pharmacy contact our office and allow 72 hours for refills to be completed.    Today you received Venofer 400 mg IV iron infusion.    BELOW ARE SYMPTOMS THAT SHOULD BE REPORTED IMMEDIATELY: *FEVER GREATER THAN 100.4 F (38 C) OR HIGHER *CHILLS OR SWEATING *NAUSEA AND VOMITING THAT IS NOT CONTROLLED WITH YOUR NAUSEA MEDICATION *UNUSUAL SHORTNESS OF BREATH *UNUSUAL BRUISING OR BLEEDING *URINARY PROBLEMS (pain or burning when urinating, or frequent urination) *BOWEL PROBLEMS (unusual diarrhea, constipation, pain near the anus) TENDERNESS IN MOUTH AND THROAT WITH OR WITHOUT PRESENCE OF ULCERS (sore throat, sores in mouth, or a toothache) UNUSUAL RASH, SWELLING OR PAIN  UNUSUAL VAGINAL DISCHARGE OR ITCHING   Items with * indicate a potential emergency and should be followed up as soon as possible or go to the Emergency Department if any problems should occur.  Please show the CHEMOTHERAPY ALERT CARD or IMMUNOTHERAPY ALERT CARD  at check-in to the Emergency Department and triage nurse.  Should you have questions after your visit or need to cancel or reschedule your appointment, please contact [facility]CER [facility] [place] - A DEPT OF [facility] [phone]  and follow the prompts.  Office hours are 8:00 a.m. to 4:30 p.m. Monday - Friday. Please note that voicemails left after 4:00 p.m. may not be returned until the following business day.  We are closed weekends and major holidays. You have access to a nurse at all times for urgent questions. Please call the main number to the clinic [phone] and follow the prompts.  For any non-urgent questions, you may also contact your provider using MyChart. We now offer e-Visits for anyone 37 and older to request care online for non-urgent symptoms. For details visit [email or web address].   Also download the MyChart app! Go to the app store, search "MyChart", open the app, select [place], and log in with your MyChart username and password.

## 2024-07-21 NOTE — Progress Notes (Signed)
 Patient presents today for iron infusion.  Patient is in satisfactory condition with no new complaints voiced.  Vital signs are stable.  We will proceed with infusion per provider orders.    Peripheral IV started with good blood return pre and post infusion.  Venofer 400 mg given today per MD orders. Tolerated infusion without adverse affects. Vital signs stable. No complaints at this time. Discharged from clinic ambulatory in stable condition. Alert and oriented x 3. F/U with [facility] as scheduled.

## 2024-07-22 ENCOUNTER — Ambulatory Visit: Payer: Self-pay | Admitting: Cardiology

## 2024-07-22 ENCOUNTER — Ambulatory Visit (HOSPITAL_COMMUNITY)
Admission: RE | Admit: 2024-07-22 | Discharge: 2024-07-22 | Disposition: A | Discharge status: Home / Self Care | Source: Ambulatory Visit | Attending: Internal Medicine | Admitting: Internal Medicine

## 2024-07-22 DIAGNOSIS — I712 Thoracic aortic aneurysm, without rupture, unspecified: Secondary | ICD-10-CM | POA: Diagnosis not present

## 2024-07-22 DIAGNOSIS — I5020 Unspecified systolic (congestive) heart failure: Secondary | ICD-10-CM | POA: Insufficient documentation

## 2024-07-22 LAB — ECHOCARDIOGRAM COMPLETE
AR max vel: 2.73 cm2
AV Area VTI: 3.07 cm2
AV Area mean vel: 2.84 cm2
AV Mean grad: 2 mmHg
AV Peak grad: 3.6 mmHg
Ao pk vel: 0.96 m/s
Area-P 1/2: 4.8 cm2
S' Lateral: 3.9 cm

## 2024-07-28 ENCOUNTER — Inpatient Hospital Stay

## 2024-07-28 ENCOUNTER — Ambulatory Visit

## 2024-08-18 ENCOUNTER — Inpatient Hospital Stay

## 2024-08-18 ENCOUNTER — Inpatient Hospital Stay: Attending: Hematology

## 2024-08-18 VITALS — BP 145/75 | HR 65 | Temp 97.9°F | Resp 18

## 2024-08-18 DIAGNOSIS — N183 Chronic kidney disease, stage 3 unspecified: Secondary | ICD-10-CM

## 2024-08-18 DIAGNOSIS — N1831 Chronic kidney disease, stage 3a: Secondary | ICD-10-CM | POA: Insufficient documentation

## 2024-08-18 DIAGNOSIS — D631 Anemia in chronic kidney disease: Secondary | ICD-10-CM

## 2024-08-18 DIAGNOSIS — D500 Iron deficiency anemia secondary to blood loss (chronic): Secondary | ICD-10-CM

## 2024-08-18 DIAGNOSIS — E538 Deficiency of other specified B group vitamins: Secondary | ICD-10-CM | POA: Insufficient documentation

## 2024-08-18 DIAGNOSIS — D696 Thrombocytopenia, unspecified: Secondary | ICD-10-CM

## 2024-08-18 LAB — CBC
HCT: 29 % — ABNORMAL LOW (ref 39.0–52.0)
Hemoglobin: 9 g/dL — ABNORMAL LOW (ref 13.0–17.0)
MCH: 29.2 pg (ref 26.0–34.0)
MCHC: 31 g/dL (ref 30.0–36.0)
MCV: 94.2 fL (ref 80.0–100.0)
Platelets: 231 K/uL (ref 150–400)
RBC: 3.08 MIL/uL — ABNORMAL LOW (ref 4.22–5.81)
RDW: 16.2 % — ABNORMAL HIGH (ref 11.5–15.5)
WBC: 4 K/uL (ref 4.0–10.5)
nRBC: 0 % (ref 0.0–0.2)

## 2024-08-18 MED ORDER — EPOETIN ALFA 40000 UNIT/ML IJ SOLN
40000.0000 [IU] | Freq: Once | INTRAMUSCULAR | Status: AC
  Administered 2024-08-18: 40000 [IU] via SUBCUTANEOUS
  Filled 2024-08-18: qty 1

## 2024-08-18 MED ORDER — CYANOCOBALAMIN 1000 MCG/ML IJ SOLN
1000.0000 ug | Freq: Once | INTRAMUSCULAR | Status: AC
  Administered 2024-08-18: 1000 ug via INTRAMUSCULAR
  Filled 2024-08-18: qty 1

## 2024-08-18 NOTE — Progress Notes (Signed)
 Patient's Hgb 9.0 and blood pressure stable. patient tolerated retacrit  injection with no complaints voiced.  Site clean and dry with no bruising or swelling noted at site.  See MAR for details.  Band aid applied.  Patient stable during and after injection.  Vss with discharge and left in satisfactory condition with no s/s of distress noted. All follow ups as scheduled.   Thurman Sarver

## 2024-08-25 DIAGNOSIS — E7849 Other hyperlipidemia: Secondary | ICD-10-CM | POA: Diagnosis not present

## 2024-08-25 DIAGNOSIS — Z1329 Encounter for screening for other suspected endocrine disorder: Secondary | ICD-10-CM | POA: Diagnosis not present

## 2024-08-25 DIAGNOSIS — N183 Chronic kidney disease, stage 3 unspecified: Secondary | ICD-10-CM | POA: Diagnosis not present

## 2024-08-25 DIAGNOSIS — D649 Anemia, unspecified: Secondary | ICD-10-CM | POA: Diagnosis not present

## 2024-08-25 DIAGNOSIS — R5383 Other fatigue: Secondary | ICD-10-CM | POA: Diagnosis not present

## 2024-08-25 DIAGNOSIS — I10 Essential (primary) hypertension: Secondary | ICD-10-CM | POA: Diagnosis not present

## 2024-08-25 DIAGNOSIS — E875 Hyperkalemia: Secondary | ICD-10-CM | POA: Diagnosis not present

## 2024-09-01 DIAGNOSIS — I10 Essential (primary) hypertension: Secondary | ICD-10-CM | POA: Diagnosis not present

## 2024-09-01 DIAGNOSIS — E7849 Other hyperlipidemia: Secondary | ICD-10-CM | POA: Diagnosis not present

## 2024-09-01 DIAGNOSIS — Z23 Encounter for immunization: Secondary | ICD-10-CM | POA: Diagnosis not present

## 2024-09-01 DIAGNOSIS — K219 Gastro-esophageal reflux disease without esophagitis: Secondary | ICD-10-CM | POA: Diagnosis not present

## 2024-09-01 DIAGNOSIS — Z6831 Body mass index (BMI) 31.0-31.9, adult: Secondary | ICD-10-CM | POA: Diagnosis not present

## 2024-09-01 DIAGNOSIS — E782 Mixed hyperlipidemia: Secondary | ICD-10-CM | POA: Diagnosis not present

## 2024-09-01 DIAGNOSIS — N401 Enlarged prostate with lower urinary tract symptoms: Secondary | ICD-10-CM | POA: Diagnosis not present

## 2024-09-01 DIAGNOSIS — Z0001 Encounter for general adult medical examination with abnormal findings: Secondary | ICD-10-CM | POA: Diagnosis not present

## 2024-09-08 ENCOUNTER — Inpatient Hospital Stay

## 2024-09-08 VITALS — BP 121/63 | HR 67 | Temp 97.1°F | Resp 18

## 2024-09-08 DIAGNOSIS — D500 Iron deficiency anemia secondary to blood loss (chronic): Secondary | ICD-10-CM

## 2024-09-08 DIAGNOSIS — N1831 Chronic kidney disease, stage 3a: Secondary | ICD-10-CM | POA: Diagnosis not present

## 2024-09-08 DIAGNOSIS — N183 Chronic kidney disease, stage 3 unspecified: Secondary | ICD-10-CM

## 2024-09-08 DIAGNOSIS — D631 Anemia in chronic kidney disease: Secondary | ICD-10-CM

## 2024-09-08 DIAGNOSIS — D696 Thrombocytopenia, unspecified: Secondary | ICD-10-CM

## 2024-09-08 DIAGNOSIS — E538 Deficiency of other specified B group vitamins: Secondary | ICD-10-CM | POA: Diagnosis not present

## 2024-09-08 LAB — CBC
HCT: 29.4 % — ABNORMAL LOW (ref 39.0–52.0)
Hemoglobin: 9 g/dL — ABNORMAL LOW (ref 13.0–17.0)
MCH: 28.7 pg (ref 26.0–34.0)
MCHC: 30.6 g/dL (ref 30.0–36.0)
MCV: 93.6 fL (ref 80.0–100.0)
Platelets: 198 K/uL (ref 150–400)
RBC: 3.14 MIL/uL — ABNORMAL LOW (ref 4.22–5.81)
RDW: 17.2 % — ABNORMAL HIGH (ref 11.5–15.5)
WBC: 3.7 K/uL — ABNORMAL LOW (ref 4.0–10.5)
nRBC: 0 % (ref 0.0–0.2)

## 2024-09-08 MED ORDER — CYANOCOBALAMIN 1000 MCG/ML IJ SOLN
1000.0000 ug | Freq: Once | INTRAMUSCULAR | Status: AC
  Administered 2024-09-08: 1000 ug via INTRAMUSCULAR
  Filled 2024-09-08: qty 1

## 2024-09-08 MED ORDER — EPOETIN ALFA 40000 UNIT/ML IJ SOLN
40000.0000 [IU] | Freq: Once | INTRAMUSCULAR | Status: AC
  Administered 2024-09-08: 40000 [IU] via SUBCUTANEOUS
  Filled 2024-09-08: qty 1

## 2024-09-08 NOTE — Patient Instructions (Signed)
 [facility] [place] - A DEPT OF [facility]  Discharge Instructions: Thank you for choosing [facility] to provide your oncology and hematology care.  If you have a lab appointment with the Cancer Center - please note that after April 8th, 2024, all labs will be drawn in the cancer center.  You do not have to check in or register with the main entrance as you have in the past but will complete your check-in in the cancer center.  Wear comfortable clothing and clothing appropriate for easy access to any Portacath or PICC line.   We strive to give you quality time with your provider. You may need to reschedule your appointment if you arrive late (15 or more minutes).  Arriving late affects you and other patients whose appointments are after yours.  Also, if you miss three or more appointments without notifying the office, you may be dismissed from the clinic at the provider's discretion.      For prescription refill requests, have your pharmacy contact our office and allow 72 hours for refills to be completed.    Today you received the following chemotherapy and/or immunotherapy agents B12 Epoetin     To help prevent nausea and vomiting after your treatment, we encourage you to take your nausea medication as directed.  BELOW ARE SYMPTOMS THAT SHOULD BE REPORTED IMMEDIATELY: *FEVER GREATER THAN 100.4 F (38 C) OR HIGHER *CHILLS OR SWEATING *NAUSEA AND VOMITING THAT IS NOT CONTROLLED WITH YOUR NAUSEA MEDICATION *UNUSUAL SHORTNESS OF BREATH *UNUSUAL BRUISING OR BLEEDING *URINARY PROBLEMS (pain or burning when urinating, or frequent urination) *BOWEL PROBLEMS (unusual diarrhea, constipation, pain near the anus) TENDERNESS IN MOUTH AND THROAT WITH OR WITHOUT PRESENCE OF ULCERS (sore throat, sores in mouth, or a toothache) UNUSUAL RASH, SWELLING OR PAIN  UNUSUAL VAGINAL DISCHARGE OR ITCHING   Items with * indicate a potential emergency and should be followed up  as soon as possible or go to the Emergency Department if any problems should occur.  Please show the CHEMOTHERAPY ALERT CARD or IMMUNOTHERAPY ALERT CARD at check-in to the Emergency Department and triage nurse.  Should you have questions after your visit or need to cancel or reschedule your appointment, please contact [facility] CANCER CTR [place] - A DEPT OF [facility] [phone]  and follow the prompts.  Office hours are 8:00 a.m. to 4:30 p.m. Monday - Friday. Please note that voicemails left after 4:00 p.m. may not be returned until the following business day.  We are closed weekends and major holidays. You have access to a nurse at all times for urgent questions. Please call the main number to the clinic [phone] and follow the prompts.  For any non-urgent questions, you may also contact your provider using MyChart. We now offer e-Visits for anyone 33 and older to request care online for non-urgent symptoms. For details visit [email or web address].   Also download the MyChart app! Go to the app store, search MyChart, open the app, select [place], and log in with your MyChart username and password.

## 2024-09-08 NOTE — Progress Notes (Signed)
 Roger Hansen presents today for B12 and Epogen  injection per the provider's orders.  Stable during administration without incident; injection site WNL; see MAR for injection details.  Patient tolerated procedure well and without incident.  No questions or complaints noted at this time.

## 2024-09-29 ENCOUNTER — Inpatient Hospital Stay

## 2024-09-29 ENCOUNTER — Inpatient Hospital Stay: Attending: Hematology

## 2024-09-29 VITALS — BP 138/71 | HR 66 | Temp 96.6°F | Resp 18 | Wt 188.2 lb

## 2024-09-29 DIAGNOSIS — N1831 Chronic kidney disease, stage 3a: Secondary | ICD-10-CM | POA: Diagnosis not present

## 2024-09-29 DIAGNOSIS — D500 Iron deficiency anemia secondary to blood loss (chronic): Secondary | ICD-10-CM

## 2024-09-29 DIAGNOSIS — D631 Anemia in chronic kidney disease: Secondary | ICD-10-CM | POA: Insufficient documentation

## 2024-09-29 DIAGNOSIS — E538 Deficiency of other specified B group vitamins: Secondary | ICD-10-CM | POA: Diagnosis not present

## 2024-09-29 DIAGNOSIS — D696 Thrombocytopenia, unspecified: Secondary | ICD-10-CM

## 2024-09-29 DIAGNOSIS — N2889 Other specified disorders of kidney and ureter: Secondary | ICD-10-CM

## 2024-09-29 LAB — CBC
HCT: 30.5 % — ABNORMAL LOW (ref 39.0–52.0)
Hemoglobin: 9.4 g/dL — ABNORMAL LOW (ref 13.0–17.0)
MCH: 28.4 pg (ref 26.0–34.0)
MCHC: 30.8 g/dL (ref 30.0–36.0)
MCV: 92.1 fL (ref 80.0–100.0)
Platelets: 191 K/uL (ref 150–400)
RBC: 3.31 MIL/uL — ABNORMAL LOW (ref 4.22–5.81)
RDW: 17 % — ABNORMAL HIGH (ref 11.5–15.5)
WBC: 4.8 K/uL (ref 4.0–10.5)
nRBC: 0 % (ref 0.0–0.2)

## 2024-09-29 MED ORDER — CYANOCOBALAMIN 1000 MCG/ML IJ SOLN
1000.0000 ug | Freq: Once | INTRAMUSCULAR | Status: AC
  Administered 2024-09-29: 1000 ug via INTRAMUSCULAR
  Filled 2024-09-29: qty 1

## 2024-09-29 MED ORDER — EPOETIN ALFA 40000 UNIT/ML IJ SOLN
40000.0000 [IU] | Freq: Once | INTRAMUSCULAR | Status: AC
  Administered 2024-09-29: 40000 [IU] via SUBCUTANEOUS
  Filled 2024-09-29: qty 1

## 2024-09-29 NOTE — Progress Notes (Signed)
 Patient's Hgb 9.4 and blood pressure stable. Patient tolerated Retacrit  and B12 injection with no complaints voiced.  Site clean and dry with no bruising or swelling noted at site.  See MAR for details.  Band aid applied.  Patient stable during and after injection.  Vss with discharge and left in satisfactory condition with no s/s of distress noted. All follow ups as scheduled.   Roger Hansen

## 2024-09-29 NOTE — Patient Instructions (Signed)

## 2024-10-04 ENCOUNTER — Ambulatory Visit: Attending: Nurse Practitioner | Admitting: Nurse Practitioner

## 2024-10-04 ENCOUNTER — Encounter: Payer: Self-pay | Admitting: *Deleted

## 2024-10-04 ENCOUNTER — Encounter: Payer: Self-pay | Admitting: Nurse Practitioner

## 2024-10-04 ENCOUNTER — Encounter (HOSPITAL_COMMUNITY): Payer: Self-pay | Admitting: Oncology

## 2024-10-04 ENCOUNTER — Other Ambulatory Visit (HOSPITAL_BASED_OUTPATIENT_CLINIC_OR_DEPARTMENT_OTHER): Payer: Self-pay

## 2024-10-04 VITALS — BP 142/82 | HR 75 | Ht 65.0 in | Wt 183.0 lb

## 2024-10-04 DIAGNOSIS — I351 Nonrheumatic aortic (valve) insufficiency: Secondary | ICD-10-CM | POA: Insufficient documentation

## 2024-10-04 DIAGNOSIS — N179 Acute kidney failure, unspecified: Secondary | ICD-10-CM | POA: Diagnosis not present

## 2024-10-04 DIAGNOSIS — F1090 Alcohol use, unspecified, uncomplicated: Secondary | ICD-10-CM | POA: Insufficient documentation

## 2024-10-04 DIAGNOSIS — I712 Thoracic aortic aneurysm, without rupture, unspecified: Secondary | ICD-10-CM | POA: Insufficient documentation

## 2024-10-04 DIAGNOSIS — I5020 Unspecified systolic (congestive) heart failure: Secondary | ICD-10-CM | POA: Diagnosis not present

## 2024-10-04 DIAGNOSIS — I10 Essential (primary) hypertension: Secondary | ICD-10-CM | POA: Diagnosis not present

## 2024-10-04 MED ORDER — METOPROLOL SUCCINATE ER 25 MG PO TB24
25.0000 mg | ORAL_TABLET | Freq: Every day | ORAL | 3 refills | Status: AC
  Filled 2024-10-04: qty 90, 90d supply, fill #0

## 2024-10-04 MED ORDER — NITROGLYCERIN 0.4 MG SL SUBL
0.4000 mg | SUBLINGUAL_TABLET | SUBLINGUAL | 3 refills | Status: AC | PRN
  Filled 2024-10-04: qty 25, 8d supply, fill #0
  Filled 2024-10-20: qty 25, 7d supply, fill #0

## 2024-10-04 NOTE — Progress Notes (Unsigned)
 Cardiology Office Note   Date:  10/04/2024 ID:  Gleb, Mcguire [birth year], MRN [id number] PCP: Lari Elspeth BRAVO, MD  [facility] Providers Cardiologist:  Stanly DELENA Leavens, MD     History of Present Illness Roger Hansen is a 78 y.o. male with a PMH of HFmrEF, thoracic aortic aneurysm, HTN, CKD stage 3, dementia, mild MR, and alcohol  use, who presents today for overdue follow-up.  Last seen by Dr. Leavens on 06/04/2022.  Was overall doing well at the time.  Echo August 2025 showed normal LVEF, mild AR, ascending aorta measured 41 mm.  Was recommended to repeat echo in 1 year for dilated aorta.  Today he is here for overdue follow-up.  He states he is doing well, currently has some sinus issues currently. Denies any chest pain, shortness of breath, palpitations, syncope, presyncope, dizziness, orthopnea, PND, swelling or significant weight changes, acute bleeding, or claudication.  ROS: Negative. See HPI.  SH: Denies any tobacco use or illicit drug use.  Does admit to some alcohol  use.   Studies Reviewed  EKG:  EKG Interpretation Date/Time:  Tuesday October 04 2024 15:46:58 EDT Ventricular Rate:  77 PR Interval:  168 QRS Duration:  80 QT Interval:  398 QTC Calculation: 450 R Axis:   65  Text Interpretation: Normal sinus rhythm Normal ECG When compared with ECG of 18-Nov-2022 10:22, Questionable change in QRS axis T wave inversion no longer evident in Inferior leads T wave amplitude has decreased in Lateral leads Confirmed by Roger Hansen [phone]) on 10/04/2024 3:52:49 PM    Echo 07/2024:  1. Left ventricular ejection fraction, by estimation, is 60 to 65%. The  left ventricle has normal function. The left ventricle has no regional  wall motion abnormalities. Left ventricular diastolic parameters are  consistent with Grade I diastolic  dysfunction (impaired relaxation).   2. Right ventricular systolic function is normal. The right ventricular  size  is normal. There is normal pulmonary artery systolic pressure.   3. The mitral valve is normal in structure. Trivial mitral valve  regurgitation. No evidence of mitral stenosis.   4. The aortic valve is tricuspid. Aortic valve regurgitation is mild. No  aortic stenosis is present.   5. Aortic dilatation noted. There is mild dilatation of the ascending  aorta, measuring 41 mm.   6. The inferior vena cava is normal in size with greater than 50%  respiratory variability, suggesting right atrial pressure of 3 mmHg.  Lexiscan  08/2018 [facility]): Impression:  1.  No reversible ischemia or infarction. 2.  Mild inferolateral and inferolateral hypokinesis. 3.  Left ventricular ejection fraction: 48%. 4.  Noninvasive risk stratification: Intermediate.   Physical Exam VS:  BP (!) 142/82 (BP Location: Right Arm, Cuff Size: Large)   Pulse 75   Ht 5' 5 (1.651 m)   Wt 183 lb (83 kg)   SpO2 98%   BMI 30.45 kg/m        Wt Readings from Last 3 Encounters:  10/04/24 183 lb (83 kg)  09/29/24 188 lb 3.2 oz (85.4 kg)  07/07/24 182 lb 1.6 oz (82.6 kg)    GEN: Well nourished, well developed in no acute distress NECK: No JVD; No carotid bruits CARDIAC: S1/S2, RRR, no murmurs, rubs, gallops RESPIRATORY:  Clear to auscultation without rales, wheezing or rhonchi  ABDOMEN: Soft, non-tender, non-distended EXTREMITIES:  No edema; No deformity   ASSESSMENT AND PLAN HFimpEF Stage C, NYHA class I symptoms.  EF 60 to 65% in August 2025.  Euvolemic and well compensated on exam.  GDMT limited due to his kidney disease.  Continue current medication regimen. Low sodium diet, fluid restriction <2L, and daily weights encouraged. Educated to contact our office for weight gain of 2 lbs overnight or 5 lbs in one week.   HTN BP overall stable and pt states well controlled at thome. Discussed to monitor BP at home at least 2 hours after medications and sitting for 5-10 minutes.  Discussed SBP goal is less than  140 and he will contact our office if blood pressure remains consistently elevated.  Continue current medication regimen. Heart healthy diet and regular cardiovascular exercise encouraged.    Aortic valve regurgitation Mild aortic valve regurgitation noted on most recent echocardiogram.  Plan to update echo in 1 year as noted below.  Thoracic aortic aneurysm Most recent echo showed dilatation of ascending aorta, measuring 41 mm.  This is relatively stable from previous measurements.  Agree with Dr. Dorine previous recommendation to repeat 2D echo in 1 year for reassessment.  AKI  Most recent labs on file from July 2025 showed serum creatinine of 2.02 with eGFR 34, previous lab values were unremarkable.  He has labs coming up on November 6 and I recommended a BMET to be drawn at this visit and faxed to our office. Continue to follow with PCP.   6. Alcohol  use  Discussed risk of alcohol  consumption along with his prescription drugs.  Recommend to follow-up with PCP for further management of this.   Dispo: Will provide refills per his request.  He is requesting care closer to home and will get him established with cardiologist in our office.  Follow-up with MD/APP in 6 months or sooner anything changes.  Signed, Almarie Crate, NP

## 2024-10-04 NOTE — Patient Instructions (Addendum)
Medication Instructions:  Your physician recommends that you continue on your current medications as directed. Please refer to the Current Medication list given to you today.  Labwork: None  Testing/Procedures: None  Follow-Up: Your physician recommends that you schedule a follow-up appointment in: 6 Months with Dr.Mallipeddi   Any Other Special Instructions Will Be Listed Below (If Applicable).  If you need a refill on your cardiac medications before your next appointment, please call your pharmacy.

## 2024-10-14 ENCOUNTER — Other Ambulatory Visit (HOSPITAL_BASED_OUTPATIENT_CLINIC_OR_DEPARTMENT_OTHER): Payer: Self-pay

## 2024-10-17 ENCOUNTER — Other Ambulatory Visit (HOSPITAL_BASED_OUTPATIENT_CLINIC_OR_DEPARTMENT_OTHER): Payer: Self-pay

## 2024-10-20 ENCOUNTER — Encounter (INDEPENDENT_AMBULATORY_CARE_PROVIDER_SITE_OTHER): Payer: Self-pay | Admitting: Gastroenterology

## 2024-10-20 ENCOUNTER — Inpatient Hospital Stay

## 2024-10-20 ENCOUNTER — Other Ambulatory Visit (HOSPITAL_BASED_OUTPATIENT_CLINIC_OR_DEPARTMENT_OTHER): Payer: Self-pay

## 2024-10-20 ENCOUNTER — Inpatient Hospital Stay: Attending: Hematology | Admitting: Oncology

## 2024-10-20 VITALS — BP 168/78 | HR 68 | Temp 98.3°F | Resp 18 | Ht 65.0 in | Wt 190.0 lb

## 2024-10-20 DIAGNOSIS — E538 Deficiency of other specified B group vitamins: Secondary | ICD-10-CM

## 2024-10-20 DIAGNOSIS — D500 Iron deficiency anemia secondary to blood loss (chronic): Secondary | ICD-10-CM | POA: Diagnosis not present

## 2024-10-20 DIAGNOSIS — D631 Anemia in chronic kidney disease: Secondary | ICD-10-CM

## 2024-10-20 DIAGNOSIS — N2889 Other specified disorders of kidney and ureter: Secondary | ICD-10-CM

## 2024-10-20 DIAGNOSIS — N1831 Chronic kidney disease, stage 3a: Secondary | ICD-10-CM | POA: Diagnosis present

## 2024-10-20 DIAGNOSIS — N189 Chronic kidney disease, unspecified: Secondary | ICD-10-CM

## 2024-10-20 DIAGNOSIS — D649 Anemia, unspecified: Secondary | ICD-10-CM

## 2024-10-20 LAB — CBC WITH DIFFERENTIAL/PLATELET
Abs Immature Granulocytes: 0.01 K/uL (ref 0.00–0.07)
Basophils Absolute: 0 K/uL (ref 0.0–0.1)
Basophils Relative: 1 %
Eosinophils Absolute: 0.2 K/uL (ref 0.0–0.5)
Eosinophils Relative: 3 %
HCT: 29.9 % — ABNORMAL LOW (ref 39.0–52.0)
Hemoglobin: 9.3 g/dL — ABNORMAL LOW (ref 13.0–17.0)
Immature Granulocytes: 0 %
Lymphocytes Relative: 35 %
Lymphs Abs: 1.6 K/uL (ref 0.7–4.0)
MCH: 28.3 pg (ref 26.0–34.0)
MCHC: 31.1 g/dL (ref 30.0–36.0)
MCV: 90.9 fL (ref 80.0–100.0)
Monocytes Absolute: 0.4 K/uL (ref 0.1–1.0)
Monocytes Relative: 7 %
Neutro Abs: 2.5 K/uL (ref 1.7–7.7)
Neutrophils Relative %: 54 %
Platelets: 222 K/uL (ref 150–400)
RBC: 3.29 MIL/uL — ABNORMAL LOW (ref 4.22–5.81)
RDW: 17.9 % — ABNORMAL HIGH (ref 11.5–15.5)
WBC: 4.7 K/uL (ref 4.0–10.5)
nRBC: 0 % (ref 0.0–0.2)

## 2024-10-20 LAB — FERRITIN: Ferritin: 754 ng/mL — ABNORMAL HIGH (ref 24–336)

## 2024-10-20 LAB — COMPREHENSIVE METABOLIC PANEL WITH GFR
ALT: <5 U/L (ref 0–44)
AST: 17 U/L (ref 15–41)
Albumin: 4 g/dL (ref 3.5–5.0)
Alkaline Phosphatase: 56 U/L (ref 38–126)
Anion gap: 8 (ref 5–15)
BUN: 12 mg/dL (ref 8–23)
CO2: 27 mmol/L (ref 22–32)
Calcium: 9.5 mg/dL (ref 8.9–10.3)
Chloride: 104 mmol/L (ref 98–111)
Creatinine, Ser: 1.06 mg/dL (ref 0.61–1.24)
GFR, Estimated: >60 mL/min (ref >60)
Glucose, Bld: 97 mg/dL (ref 70–99)
Potassium: 4.9 mmol/L (ref 3.5–5.1)
Sodium: 139 mmol/L (ref 135–145)
Total Bilirubin: 0.3 mg/dL (ref 0.0–1.2)
Total Protein: 6.8 g/dL (ref 6.5–8.1)

## 2024-10-20 LAB — IRON AND TIBC
Iron: 37 ug/dL — ABNORMAL LOW (ref 45–182)
Saturation Ratios: 15 % — ABNORMAL LOW (ref 17.9–39.5)
TIBC: 245 ug/dL — ABNORMAL LOW (ref 250–450)
UIBC: 209 ug/dL

## 2024-10-20 LAB — VITAMIN B12: Vitamin B-12: 702 pg/mL (ref 180–914)

## 2024-10-20 MED ORDER — CYANOCOBALAMIN 1000 MCG/ML IJ SOLN
1000.0000 ug | Freq: Once | INTRAMUSCULAR | Status: AC
  Administered 2024-10-20: 1000 ug via INTRAMUSCULAR
  Filled 2024-10-20: qty 1

## 2024-10-20 MED ORDER — EPOETIN ALFA 40000 UNIT/ML IJ SOLN
40000.0000 [IU] | Freq: Once | INTRAMUSCULAR | Status: AC
  Administered 2024-10-20: 40000 [IU] via SUBCUTANEOUS
  Filled 2024-10-20: qty 1

## 2024-10-20 NOTE — Assessment & Plan Note (Addendum)
-  Labs from 10/20/2024 show improvement of B12 levels at 702. -B12 appears to be helping his energy levels. -Continue every 3-week B12 injections along with his Retacrit . -She will receive a B12 shot today.

## 2024-10-20 NOTE — Assessment & Plan Note (Addendum)
-  He receives 40,000 units of Retacrit  every 3 weeks-last given on 09/29/2024. -Hemoglobin has ranged between 9 and 10.4 over the past few months. -Recommend we continue Retacrit  every 3 weeks.  Hemoglobin is stable.

## 2024-10-20 NOTE — Assessment & Plan Note (Addendum)
-   He continues to take iron  tablets 325 mg once daily.  We discussed given his stools have been very hard, trying iron  supplements every other day to see if this helps.  Continue Metamucil and stool softeners as needed. - He has required intermittent IV iron  (300 mg IV Venofer ) last given on 07/14/2024 and 07/21/2024. - He reports intermittent black bowel movements.  He denies any bright red blood per rectum, hematemesis, or epistaxis.    -He had a capsule study (02/10/2024) which showed mid esophageal stricture, duodenitis doubt the presence of bleeding or stigmata of recent bleeding and 3 nonbleeding small AVMs in proximal small bowel.  He will be scheduled for a push enteroscopy in the near future. - Labs from 10/20/2024 show persistent anemia with a hemoglobin of 9.3, iron  saturation 15% with a low TIBC.  Ferritin is elevated. -Given iron  saturations are low would recommend two additional doses of IV iron  400 milligram IV Venofer . -We discussed if he continues to have worsening anemia, repeat bone marrow biopsy would be reasonable. -Patient needs to follow-up with GI ASAP to figure out where he is bleeding given iron  levels continue to drop.

## 2024-10-20 NOTE — Progress Notes (Signed)
Patient presents today for Retacrit injection. Hemoglobin reviewed prior to administration. VSS tolerated without incident or complaint. See MAR for details. Patient stable during and after injection.  Patient tolerated injection with no complaints voiced. Site clean and dry with no bruising or swelling noted at site. See MAR for details. Band aid applied.  Patient stable during and after injection. VSS with discharge and left in satisfactory condition with no s/s of distress noted.

## 2024-10-20 NOTE — Assessment & Plan Note (Signed)
-  He receives 40,000 units of Retacrit  every 3 weeks-last given on 06/16/2024. -Hemoglobin has ranged between 9 and 10.4 over the past few months. -Recommend we continue Retacrit  every 3 weeks.  Hemoglobin is stable.

## 2024-10-20 NOTE — Progress Notes (Signed)
 [facility] OFFICE PROGRESS NOTE  Burdine, Elspeth BRAVO, MD  ASSESSMENT & PLAN:  Assessment & Plan Iron  deficiency anemia due to chronic blood loss - He continues to take iron  tablets 325 mg once daily.  We discussed given his stools have been very hard, trying iron  supplements every other day to see if this helps.  Continue Metamucil and stool softeners as needed. - He has required intermittent IV iron  (300 mg IV Venofer ) last given on 07/14/2024 and 07/21/2024. - He reports intermittent black bowel movements.  He denies any bright red blood per rectum, hematemesis, or epistaxis.    -He had a capsule study (02/10/2024) which showed mid esophageal stricture, duodenitis doubt the presence of bleeding or stigmata of recent bleeding and 3 nonbleeding small AVMs in proximal small bowel.  He will be scheduled for a push enteroscopy in the near future. - Labs from 10/20/2024 show persistent anemia with a hemoglobin of 9.3, iron  saturation 15% with a low TIBC.  Ferritin is elevated. -Given iron  saturations are low would recommend two additional doses of IV iron  400 milligram IV Venofer . -We discussed if he continues to have worsening anemia, repeat bone marrow biopsy would be reasonable. -Patient needs to follow-up with GI ASAP to figure out where he is bleeding given iron  levels continue to drop. Anemia in chronic kidney disease, unspecified CKD stage -He receives 40,000 units of Retacrit  every 3 weeks-last given on 09/29/2024. -Hemoglobin has ranged between 9 and 10.4 over the past few months. -Recommend we continue Retacrit  every 3 weeks.  Hemoglobin is stable. Vitamin B12 deficiency disease -Labs from 10/20/2024 show improvement of B12 levels at 702. -B12 appears to be helping his energy levels. -Continue every 3-week B12 injections along with his Retacrit . -She will receive a B12 shot today. Anemia in stage 3a chronic kidney disease (HCC) -He receives 40,000 units of Retacrit  every 3  weeks-last given on 06/16/2024. -Hemoglobin has ranged between 9 and 10.4 over the past few months. -Recommend we continue Retacrit  every 3 weeks.  Hemoglobin is stable.    Orders Placed This Encounter  Procedures   Methylmalonic acid, serum    Standing Status:   Future    Expected Date:   01/20/2025    Expiration Date:   04/20/2025   Vitamin B12    Standing Status:   Future    Expected Date:   01/20/2025    Expiration Date:   04/20/2025   Iron  and TIBC ([facility] ONLY)    Standing Status:   Future    Expected Date:   01/20/2025    Expiration Date:   04/20/2025   Ferritin    Standing Status:   Future    Expected Date:   01/20/2025    Expiration Date:   04/20/2025   Comprehensive metabolic panel    Standing Status:   Future    Expected Date:   01/20/2025    Expiration Date:   04/20/2025   CBC with Differential    Standing Status:   Future    Expected Date:   01/20/2025    Expiration Date:   04/20/2025    INTERVAL HISTORY: Patient returns for recurrent anemia secondary to iron  deficiency, B12 deficiency and CKD.  He currently receives 40,000 units Retacrit  and 1000 mcg's B12 every 3 weeks.  He receives IV iron  intermittently last given on 07/14/2024 and 07/21/2024.  He continues to take oral iron  325 mg daily.    We discussed if hemoglobin continues to trend  down despite adequate nutritional lab values, would recommend repeat bone marrow biopsy.  He denies any hospitalizations, surgeries or changes to his baseline health since he was last evaluated.  Reports he has been doing well since his last visit with us .  Appetite and energy levels are 100%.  He has 8 out of 10 pain in his back.  He has developed some shortness of breath with exertion starting around May.  Denies any chest pain or palpitations.  Has occasional constipation and diarrhea.  Has dizziness and headaches intermittently.  Has occasional trouble sleeping.    He continues to take oral iron  supplements.  We reviewed most  recent CBC.  SUMMARY OF HEMATOLOGIC HISTORY: Patient is a 78 year old male with past medical history significant for iron  deficiency, B12 deficiency, chronic kidney disease and thrombocytopenia.  He has had anemia since 2014 with a negative peripheral workup, negative bone marrow aspiration and biopsy and cytogenetics on 10/16/2016.  The bone marrow biopsy showed slightly hypercellular marrow with trilineage hematopoiesis chromosome analysis was normal.  MGUS/myeloma panel was negative.  Hemolysis workup was negative.  He has been receiving intermittent Epogen  and parenteral iron  since June 2020.  He is a Jehovah witness and does not receive blood products.  Most up-to-date EGD from 11/20/2022 shows erythematous gastric mucosa duodenitis and colonoscopy from 11/20/2022 showed single nonbleeding angiodysplastic lesion treated with APC, polyps x 4 pathogen benign, diverticulosis.  Lab Results  Component Value Date   HGB 9.3 (L) 10/20/2024   FERRITIN 754 (H) 10/20/2024   VITAMINB12 702 10/20/2024    Vitals:   10/20/24 0937 10/20/24 0943  BP: (!) 158/71 (!) 168/78  Pulse: 68   Resp: 18   Temp: 98.3 F (36.8 C)   SpO2: 100%     Review of Systems  Constitutional:  Positive for malaise/fatigue. Negative for weight loss.  Respiratory:  Positive for shortness of breath.   Cardiovascular:  Positive for chest pain.  Gastrointestinal:  Positive for constipation and diarrhea.  Neurological:  Positive for dizziness, sensory change and headaches.  Psychiatric/Behavioral:  The patient has insomnia.     Physical Exam Constitutional:      Appearance: Normal appearance.  Cardiovascular:     Rate and Rhythm: Normal rate and regular rhythm.  Pulmonary:     Effort: Pulmonary effort is normal.     Breath sounds: Normal breath sounds.  Abdominal:     General: Bowel sounds are normal.     Palpations: Abdomen is soft.  Musculoskeletal:        General: No swelling. Normal range of motion.   Neurological:     Mental Status: He is alert and oriented to person, place, and time. Mental status is at baseline.    I spent 35 minutes dedicated to the care of this patient (face-to-face and non-face-to-face) on the date of the encounter to include what is described in the assessment and plan.,  Delon Hope, NP 10/20/2024 12:44 PM

## 2024-10-20 NOTE — Patient Instructions (Signed)
 [facility] [place] - A DEPT OF [facility]  Discharge Instructions: Thank you for choosing [facility] to provide your oncology and hematology care.  If you have a lab appointment with the Cancer Center - please note that after April 8th, 2024, all labs will be drawn in the cancer center.  You do not have to check in or register with the main entrance as you have in the past but will complete your check-in in the cancer center.  Wear comfortable clothing and clothing appropriate for easy access to any Portacath or PICC line.   We strive to give you quality time with your provider. You may need to reschedule your appointment if you arrive late (15 or more minutes).  Arriving late affects you and other patients whose appointments are after yours.  Also, if you miss three or more appointments without notifying the office, you may be dismissed from the clinic at the provider's discretion.      For prescription refill requests, have your pharmacy contact our office and allow 72 hours for refills to be completed.    Today you received the following B12 injection and retacrit, return as scheduled.   To help prevent nausea and vomiting after your treatment, we encourage you to take your nausea medication as directed.  BELOW ARE SYMPTOMS THAT SHOULD BE REPORTED IMMEDIATELY: *FEVER GREATER THAN 100.4 F (38 C) OR HIGHER *CHILLS OR SWEATING *NAUSEA AND VOMITING THAT IS NOT CONTROLLED WITH YOUR NAUSEA MEDICATION *UNUSUAL SHORTNESS OF BREATH *UNUSUAL BRUISING OR BLEEDING *URINARY PROBLEMS (pain or burning when urinating, or frequent urination) *BOWEL PROBLEMS (unusual diarrhea, constipation, pain near the anus) TENDERNESS IN MOUTH AND THROAT WITH OR WITHOUT PRESENCE OF ULCERS (sore throat, sores in mouth, or a toothache) UNUSUAL RASH, SWELLING OR PAIN  UNUSUAL VAGINAL DISCHARGE OR ITCHING   Items with * indicate a potential emergency and should be followed up as  soon as possible or go to the Emergency Department if any problems should occur.  Please show the CHEMOTHERAPY ALERT CARD or IMMUNOTHERAPY ALERT CARD at check-in to the Emergency Department and triage nurse.  Should you have questions after your visit or need to cancel or reschedule your appointment, please contact [facility]  - A DEPT OF [facility] [phone]  and follow the prompts.  Office hours are 8:00 a.m. to 4:30 p.m. Monday - Friday. Please note that voicemails left after 4:00 p.m. may not be returned until the following business day.  We are closed weekends and major holidays. You have access to a nurse at all times for urgent questions. Please call the main number to the clinic [phone] and follow the prompts.  For any non-urgent questions, you may also contact your provider using MyChart. We now offer e-Visits for anyone 67 and older to request care online for non-urgent symptoms. For details visit [email or web address].   Also download the MyChart app! Go to the app store, search "MyChart", open the app, select [place], and log in with your MyChart username and password.

## 2024-10-23 ENCOUNTER — Ambulatory Visit: Payer: Self-pay | Admitting: Oncology

## 2024-10-23 LAB — METHYLMALONIC ACID, SERUM: Methylmalonic Acid, Quantitative: 288 nmol/L (ref 0–378)

## 2024-10-24 ENCOUNTER — Ambulatory Visit: Admitting: Nurse Practitioner

## 2024-10-27 ENCOUNTER — Inpatient Hospital Stay

## 2024-10-28 ENCOUNTER — Inpatient Hospital Stay

## 2024-11-03 ENCOUNTER — Inpatient Hospital Stay

## 2024-11-04 ENCOUNTER — Inpatient Hospital Stay

## 2024-11-04 VITALS — BP 135/64 | HR 66 | Temp 97.6°F | Resp 18

## 2024-11-04 DIAGNOSIS — E538 Deficiency of other specified B group vitamins: Secondary | ICD-10-CM | POA: Diagnosis not present

## 2024-11-04 DIAGNOSIS — D631 Anemia in chronic kidney disease: Secondary | ICD-10-CM

## 2024-11-04 DIAGNOSIS — N2889 Other specified disorders of kidney and ureter: Secondary | ICD-10-CM

## 2024-11-04 DIAGNOSIS — D500 Iron deficiency anemia secondary to blood loss (chronic): Secondary | ICD-10-CM

## 2024-11-04 DIAGNOSIS — N1831 Chronic kidney disease, stage 3a: Secondary | ICD-10-CM | POA: Diagnosis not present

## 2024-11-04 MED ORDER — SODIUM CHLORIDE 0.9 % IV SOLN: INTRAVENOUS | Status: DC

## 2024-11-04 MED ORDER — CETIRIZINE HCL 10 MG/ML IV SOLN
10.0000 mg | Freq: Once | INTRAVENOUS | Status: AC
  Administered 2024-11-04: 10 mg via INTRAVENOUS
  Filled 2024-11-04: qty 1

## 2024-11-04 MED ORDER — ACETAMINOPHEN 325 MG PO TABS
650.0000 mg | ORAL_TABLET | Freq: Once | ORAL | Status: AC
  Administered 2024-11-04: 650 mg via ORAL
  Filled 2024-11-04: qty 2

## 2024-11-04 MED ORDER — IRON SUCROSE 400 MG IVPB - SIMPLE MED
400.0000 mg | Freq: Once | Status: AC
  Administered 2024-11-04: 400 mg via INTRAVENOUS
  Filled 2024-11-04: qty 400

## 2024-11-04 NOTE — Progress Notes (Signed)
Stable during infusion without adverse affects.  Vital signs stable.  No complaints at this time.  Discharge from clinic ambulatory in stable condition.  Alert and oriented X 3.  Follow up with [facility] as scheduled.

## 2024-11-04 NOTE — Patient Instructions (Signed)

## 2024-11-16 ENCOUNTER — Inpatient Hospital Stay: Attending: Hematology

## 2024-11-16 VITALS — BP 116/57 | HR 66 | Temp 97.7°F | Resp 18

## 2024-11-16 DIAGNOSIS — N1831 Chronic kidney disease, stage 3a: Secondary | ICD-10-CM | POA: Diagnosis present

## 2024-11-16 DIAGNOSIS — N2889 Other specified disorders of kidney and ureter: Secondary | ICD-10-CM

## 2024-11-16 DIAGNOSIS — D631 Anemia in chronic kidney disease: Secondary | ICD-10-CM | POA: Insufficient documentation

## 2024-11-16 DIAGNOSIS — D500 Iron deficiency anemia secondary to blood loss (chronic): Secondary | ICD-10-CM | POA: Insufficient documentation

## 2024-11-16 DIAGNOSIS — E538 Deficiency of other specified B group vitamins: Secondary | ICD-10-CM | POA: Diagnosis not present

## 2024-11-16 MED ORDER — SODIUM CHLORIDE 0.9 % IV SOLN: INTRAVENOUS | Status: DC

## 2024-11-16 MED ORDER — ACETAMINOPHEN 325 MG PO TABS
650.0000 mg | ORAL_TABLET | Freq: Once | ORAL | Status: DC
  Filled 2024-11-16: qty 2

## 2024-11-16 MED ORDER — IRON SUCROSE 400 MG IVPB - SIMPLE MED
400.0000 mg | Freq: Once | Status: AC
  Administered 2024-11-16: 400 mg via INTRAVENOUS
  Filled 2024-11-16: qty 400

## 2024-11-16 MED ORDER — CETIRIZINE HCL 10 MG/ML IV SOLN
10.0000 mg | Freq: Once | INTRAVENOUS | Status: AC
  Administered 2024-11-16: 10 mg via INTRAVENOUS
  Filled 2024-11-16: qty 1

## 2024-11-16 NOTE — Patient Instructions (Signed)
 [facility] [place] - [facility]  Discharge Instructions: Thank you for choosing [facility] to provide your oncology and hematology care.  If you have a lab appointment with the Cancer Center - please note that after April 8th, 2024, all labs will be drawn in the cancer center.  You do not have to check in or register with the main entrance as you have in the past but will complete your check-in in the cancer center.  Wear comfortable clothing and clothing appropriate for easy access to any Portacath or PICC line.   We strive to give you quality time with your provider. You may need to reschedule your appointment if you arrive late (15 or more minutes).  Arriving late affects you and other patients whose appointments are after yours.  Also, if you miss three or more appointments without notifying the office, you may be dismissed from the clinic at the provider's discretion.      For prescription refill requests, have your pharmacy contact our office and allow 72 hours for refills to be completed.    Today you received Venofer 400 mg IV iron infusion.    BELOW ARE SYMPTOMS THAT SHOULD BE REPORTED IMMEDIATELY: *FEVER GREATER THAN 100.4 F (38 C) OR HIGHER *CHILLS OR SWEATING *NAUSEA AND VOMITING THAT IS NOT CONTROLLED WITH YOUR NAUSEA MEDICATION *UNUSUAL SHORTNESS OF BREATH *UNUSUAL BRUISING OR BLEEDING *URINARY PROBLEMS (pain or burning when urinating, or frequent urination) *BOWEL PROBLEMS (unusual diarrhea, constipation, pain near the anus) TENDERNESS IN MOUTH AND THROAT WITH OR WITHOUT PRESENCE OF ULCERS (sore throat, sores in mouth, or a toothache) UNUSUAL RASH, SWELLING OR PAIN  UNUSUAL VAGINAL DISCHARGE OR ITCHING   Items with * indicate a potential emergency and should be followed up as soon as possible or go to the Emergency Department if any problems should occur.  Please show the CHEMOTHERAPY ALERT CARD or IMMUNOTHERAPY ALERT CARD  at check-in to the Emergency Department and triage nurse.  Should you have questions after your visit or need to cancel or reschedule your appointment, please contact [facility]CER [facility] [place] - A DEPT OF [facility] [phone]  and follow the prompts.  Office hours are 8:00 a.m. to 4:30 p.m. Monday - Friday. Please note that voicemails left after 4:00 p.m. may not be returned until the following business day.  We are closed weekends and major holidays. You have access to a nurse at all times for urgent questions. Please call the main number to the clinic [phone] and follow the prompts.  For any non-urgent questions, you may also contact your provider using MyChart. We now offer e-Visits for anyone 37 and older to request care online for non-urgent symptoms. For details visit [email or web address].   Also download the MyChart app! Go to the app store, search "MyChart", open the app, select [place], and log in with your MyChart username and password.

## 2024-11-16 NOTE — Progress Notes (Signed)
 Patient presents today for iron infusion.  Patient is in satisfactory condition with no new complaints voiced.  Vital signs are stable.  We will proceed with infusion per provider orders.    Peripheral IV started with good blood return pre and post infusion.  Venofer 400 mg given today per MD orders. Tolerated infusion without adverse affects. Vital signs stable. No complaints at this time. Discharged from clinic ambulatory in stable condition. Alert and oriented x 3. F/U with [facility] as scheduled.

## 2024-11-17 ENCOUNTER — Inpatient Hospital Stay

## 2024-11-17 ENCOUNTER — Inpatient Hospital Stay: Attending: Hematology

## 2024-11-17 VITALS — BP 116/67 | HR 79 | Temp 97.0°F | Resp 18

## 2024-11-17 DIAGNOSIS — D500 Iron deficiency anemia secondary to blood loss (chronic): Secondary | ICD-10-CM

## 2024-11-17 DIAGNOSIS — N2889 Other specified disorders of kidney and ureter: Secondary | ICD-10-CM

## 2024-11-17 DIAGNOSIS — D631 Anemia in chronic kidney disease: Secondary | ICD-10-CM

## 2024-11-17 DIAGNOSIS — D696 Thrombocytopenia, unspecified: Secondary | ICD-10-CM

## 2024-11-17 DIAGNOSIS — N1831 Chronic kidney disease, stage 3a: Secondary | ICD-10-CM | POA: Diagnosis not present

## 2024-11-17 LAB — CBC
HCT: 35.2 % — ABNORMAL LOW (ref 39.0–52.0)
Hemoglobin: 10.8 g/dL — ABNORMAL LOW (ref 13.0–17.0)
MCH: 27.8 pg (ref 26.0–34.0)
MCHC: 30.7 g/dL (ref 30.0–36.0)
MCV: 90.7 fL (ref 80.0–100.0)
Platelets: 166 K/uL (ref 150–400)
RBC: 3.88 MIL/uL — ABNORMAL LOW (ref 4.22–5.81)
RDW: 17.5 % — ABNORMAL HIGH (ref 11.5–15.5)
WBC: 5.9 K/uL (ref 4.0–10.5)
nRBC: 0.3 % — ABNORMAL HIGH (ref 0.0–0.2)

## 2024-11-17 MED ORDER — EPOETIN ALFA 40000 UNIT/ML IJ SOLN
40000.0000 [IU] | Freq: Once | INTRAMUSCULAR | Status: AC
  Administered 2024-11-17: 40000 [IU] via SUBCUTANEOUS
  Filled 2024-11-17: qty 1

## 2024-11-17 MED ORDER — CYANOCOBALAMIN 1000 MCG/ML IJ SOLN
1000.0000 ug | Freq: Once | INTRAMUSCULAR | Status: AC
  Administered 2024-11-17: 1000 ug via INTRAMUSCULAR
  Filled 2024-11-17: qty 1

## 2024-11-17 NOTE — Patient Instructions (Signed)
 [facility] [place] - A DEPT OF [facility]  Discharge Instructions: Thank you for choosing [facility] to provide your oncology and hematology care.  If you have a lab appointment with the Cancer Center - please note that after April 8th, 2024, all labs will be drawn in the cancer center.  You do not have to check in or register with the main entrance as you have in the past but will complete your check-in in the cancer center.  Wear comfortable clothing and clothing appropriate for easy access to any Portacath or PICC line.   We strive to give you quality time with your provider. You may need to reschedule your appointment if you arrive late (15 or more minutes).  Arriving late affects you and other patients whose appointments are after yours.  Also, if you miss three or more appointments without notifying the office, you may be dismissed from the clinic at the provider's discretion.      For prescription refill requests, have your pharmacy contact our office and allow 72 hours for refills to be completed.    Today you received the following retacrit  and B12 injection, return as scheduled.   To help prevent nausea and vomiting after your treatment, we encourage you to take your nausea medication as directed.  BELOW ARE SYMPTOMS THAT SHOULD BE REPORTED IMMEDIATELY: *FEVER GREATER THAN 100.4 F (38 C) OR HIGHER *CHILLS OR SWEATING *NAUSEA AND VOMITING THAT IS NOT CONTROLLED WITH YOUR NAUSEA MEDICATION *UNUSUAL SHORTNESS OF BREATH *UNUSUAL BRUISING OR BLEEDING *URINARY PROBLEMS (pain or burning when urinating, or frequent urination) *BOWEL PROBLEMS (unusual diarrhea, constipation, pain near the anus) TENDERNESS IN MOUTH AND THROAT WITH OR WITHOUT PRESENCE OF ULCERS (sore throat, sores in mouth, or a toothache) UNUSUAL RASH, SWELLING OR PAIN  UNUSUAL VAGINAL DISCHARGE OR ITCHING   Items with * indicate a potential emergency and should be followed up as  soon as possible or go to the Emergency Department if any problems should occur.  Please show the CHEMOTHERAPY ALERT CARD or IMMUNOTHERAPY ALERT CARD at check-in to the Emergency Department and triage nurse.  Should you have questions after your visit or need to cancel or reschedule your appointment, please contact [facility]  - A DEPT OF [facility] [phone]  and follow the prompts.  Office hours are 8:00 a.m. to 4:30 p.m. Monday - Friday. Please note that voicemails left after 4:00 p.m. may not be returned until the following business day.  We are closed weekends and major holidays. You have access to a nurse at all times for urgent questions. Please call the main number to the clinic [phone] and follow the prompts.  For any non-urgent questions, you may also contact your provider using MyChart. We now offer e-Visits for anyone 43 and older to request care online for non-urgent symptoms. For details visit [email or web address].   Also download the MyChart app! Go to the app store, search MyChart, open the app, select [place], and log in with your MyChart username and password.

## 2024-11-17 NOTE — Progress Notes (Signed)
Patient presents today for Retacrit and B12  injection. Hemoglobin reviewed prior to administration. VSS tolerated without incident or complaint. See MAR for details. Patient stable during and after injection. Patient discharged in satisfactory condition with no s/s of distress noted.  

## 2024-12-16 ENCOUNTER — Inpatient Hospital Stay: Attending: Hematology

## 2024-12-16 ENCOUNTER — Inpatient Hospital Stay

## 2024-12-16 VITALS — BP 133/76 | HR 77 | Temp 97.7°F | Resp 18

## 2024-12-16 DIAGNOSIS — N2889 Other specified disorders of kidney and ureter: Secondary | ICD-10-CM

## 2024-12-16 DIAGNOSIS — D696 Thrombocytopenia, unspecified: Secondary | ICD-10-CM

## 2024-12-16 DIAGNOSIS — D631 Anemia in chronic kidney disease: Secondary | ICD-10-CM | POA: Insufficient documentation

## 2024-12-16 DIAGNOSIS — N1831 Chronic kidney disease, stage 3a: Secondary | ICD-10-CM | POA: Diagnosis present

## 2024-12-16 DIAGNOSIS — D500 Iron deficiency anemia secondary to blood loss (chronic): Secondary | ICD-10-CM

## 2024-12-16 LAB — CBC
HCT: 31.5 % — ABNORMAL LOW (ref 39.0–52.0)
Hemoglobin: 10 g/dL — ABNORMAL LOW (ref 13.0–17.0)
MCH: 28.9 pg (ref 26.0–34.0)
MCHC: 31.7 g/dL (ref 30.0–36.0)
MCV: 91 fL (ref 80.0–100.0)
Platelets: 162 K/uL (ref 150–400)
RBC: 3.46 MIL/uL — ABNORMAL LOW (ref 4.22–5.81)
RDW: 18.1 % — ABNORMAL HIGH (ref 11.5–15.5)
WBC: 5.8 K/uL (ref 4.0–10.5)
nRBC: 0 % (ref 0.0–0.2)

## 2024-12-16 MED ORDER — EPOETIN ALFA 40000 UNIT/ML IJ SOLN
40000.0000 [IU] | Freq: Once | INTRAMUSCULAR | Status: AC
  Administered 2024-12-16: 40000 [IU] via SUBCUTANEOUS
  Filled 2024-12-16: qty 1

## 2024-12-16 MED ORDER — CYANOCOBALAMIN 1000 MCG/ML IJ SOLN
1000.0000 ug | Freq: Once | INTRAMUSCULAR | Status: AC
  Administered 2024-12-16: 1000 ug via INTRAMUSCULAR
  Filled 2024-12-16: qty 1

## 2024-12-16 NOTE — Progress Notes (Signed)
 Patient presents today for Retacrit  & B12 injection. Hemoglobin reviewed prior to administration. VSS tolerated without incident or complaint. See MAR for details. Patient stable during and after injection. Patient discharged in satisfactory condition with no s/s of distress noted.

## 2024-12-16 NOTE — Patient Instructions (Signed)
 [facility] [place] - A DEPT OF [place]  Discharge Instructions: Thank you for choosing [facility] to provide your oncology and hematology care.  If you have a lab appointment with the Cancer Center - please note that after April 8th, 2024, all labs will be drawn in the cancer center.  You do not have to check in or register with the main entrance as you have in the past but will complete your check-in in the cancer center.  Wear comfortable clothing and clothing appropriate for easy access to any Portacath or PICC line.   We strive to give you quality time with your provider. You may need to reschedule your appointment if you arrive late (15 or more minutes).  Arriving late affects you and other patients whose appointments are after yours.  Also, if you miss three or more appointments without notifying the office, you may be dismissed from the clinic at the provider's discretion.      For prescription refill requests, have your pharmacy contact our office and allow 72 hours for refills to be completed.    Today you received the following B12 and Retacrit , return as scheduled.   To help prevent nausea and vomiting after your treatment, we encourage you to take your nausea medication as directed.  BELOW ARE SYMPTOMS THAT SHOULD BE REPORTED IMMEDIATELY: *FEVER GREATER THAN 100.4 F (38 C) OR HIGHER *CHILLS OR SWEATING *NAUSEA AND VOMITING THAT IS NOT CONTROLLED WITH YOUR NAUSEA MEDICATION *UNUSUAL SHORTNESS OF BREATH *UNUSUAL BRUISING OR BLEEDING *URINARY PROBLEMS (pain or burning when urinating, or frequent urination) *BOWEL PROBLEMS (unusual diarrhea, constipation, pain near the anus) TENDERNESS IN MOUTH AND THROAT WITH OR WITHOUT PRESENCE OF ULCERS (sore throat, sores in mouth, or a toothache) UNUSUAL RASH, SWELLING OR PAIN  UNUSUAL VAGINAL DISCHARGE OR ITCHING   Items with * indicate a potential emergency and should be followed up as soon as  possible or go to the Emergency Department if any problems should occur.  Please show the CHEMOTHERAPY ALERT CARD or IMMUNOTHERAPY ALERT CARD at check-in to the Emergency Department and triage nurse.  Should you have questions after your visit or need to cancel or reschedule your appointment, please contact [facility] [place] - A DEPT OF [facility] [phone]  and follow the prompts.  Office hours are 8:00 a.m. to 4:30 p.m. Monday - Friday. Please note that voicemails left after 4:00 p.m. may not be returned until the following business day.  We are closed weekends and major holidays. You have access to a nurse at all times for urgent questions. Please call the main number to the clinic [phone] and follow the prompts.  For any non-urgent questions, you may also contact your provider using MyChart. We now offer e-Visits for anyone 60 and older to request care online for non-urgent symptoms. For details visit [email or web address].   Also download the MyChart app! Go to the app store, search MyChart, open the app, select [place], and log in with your MyChart username and password.

## 2025-01-05 ENCOUNTER — Inpatient Hospital Stay

## 2025-01-05 VITALS — BP 125/73 | HR 75 | Resp 18

## 2025-01-05 DIAGNOSIS — N1831 Chronic kidney disease, stage 3a: Secondary | ICD-10-CM | POA: Diagnosis not present

## 2025-01-05 DIAGNOSIS — D696 Thrombocytopenia, unspecified: Secondary | ICD-10-CM

## 2025-01-05 DIAGNOSIS — D500 Iron deficiency anemia secondary to blood loss (chronic): Secondary | ICD-10-CM

## 2025-01-05 DIAGNOSIS — D631 Anemia in chronic kidney disease: Secondary | ICD-10-CM

## 2025-01-05 DIAGNOSIS — N2889 Other specified disorders of kidney and ureter: Secondary | ICD-10-CM

## 2025-01-05 LAB — CBC
HCT: 29.9 % — ABNORMAL LOW (ref 39.0–52.0)
Hemoglobin: 9.4 g/dL — ABNORMAL LOW (ref 13.0–17.0)
MCH: 29.5 pg (ref 26.0–34.0)
MCHC: 31.4 g/dL (ref 30.0–36.0)
MCV: 93.7 fL (ref 80.0–100.0)
Platelets: 168 K/uL (ref 150–400)
RBC: 3.19 MIL/uL — ABNORMAL LOW (ref 4.22–5.81)
RDW: 18.9 % — ABNORMAL HIGH (ref 11.5–15.5)
WBC: 4.2 K/uL (ref 4.0–10.5)
nRBC: 0 % (ref 0.0–0.2)

## 2025-01-05 MED ORDER — EPOETIN ALFA 40000 UNIT/ML IJ SOLN
40000.0000 [IU] | Freq: Once | INTRAMUSCULAR | Status: AC
  Administered 2025-01-05: 40000 [IU] via SUBCUTANEOUS
  Filled 2025-01-05: qty 1

## 2025-01-05 MED ORDER — CYANOCOBALAMIN 1000 MCG/ML IJ SOLN
1000.0000 ug | Freq: Once | INTRAMUSCULAR | Status: AC
  Administered 2025-01-05: 1000 ug via INTRAMUSCULAR
  Filled 2025-01-05: qty 1

## 2025-01-05 NOTE — Progress Notes (Signed)
 Patient tolerated injection with no complaints voiced. Site clean and dry with no bruising or swelling noted at site. See MAR for details. Band aid applied.  Patient stable during and after injection.  Patient presents today for Retacrit injection. Hemoglobin reviewed prior to administration. VSS tolerated without incident or complaint. See MAR for details. Patient stable during and after injection. Patient discharged in satisfactory condition with no s/s of distress noted.

## 2025-01-05 NOTE — Patient Instructions (Signed)
 [facility] [place] - A DEPT OF [facility]  Discharge Instructions: Thank you for choosing [facility] to provide your oncology and hematology care.  If you have a lab appointment with the Cancer Center - please note that after April 8th, 2024, all labs will be drawn in the cancer center.  You do not have to check in or register with the main entrance as you have in the past but will complete your check-in in the cancer center.  Wear comfortable clothing and clothing appropriate for easy access to any Portacath or PICC line.   We strive to give you quality time with your provider. You may need to reschedule your appointment if you arrive late (15 or more minutes).  Arriving late affects you and other patients whose appointments are after yours.  Also, if you miss three or more appointments without notifying the office, you may be dismissed from the clinic at the provider's discretion.      For prescription refill requests, have your pharmacy contact our office and allow 72 hours for refills to be completed.    Today you received the following Retacrit  and B12 return as scheduled.   To help prevent nausea and vomiting after your treatment, we encourage you to take your nausea medication as directed.  BELOW ARE SYMPTOMS THAT SHOULD BE REPORTED IMMEDIATELY: *FEVER GREATER THAN 100.4 F (38 C) OR HIGHER *CHILLS OR SWEATING *NAUSEA AND VOMITING THAT IS NOT CONTROLLED WITH YOUR NAUSEA MEDICATION *UNUSUAL SHORTNESS OF BREATH *UNUSUAL BRUISING OR BLEEDING *URINARY PROBLEMS (pain or burning when urinating, or frequent urination) *BOWEL PROBLEMS (unusual diarrhea, constipation, pain near the anus) TENDERNESS IN MOUTH AND THROAT WITH OR WITHOUT PRESENCE OF ULCERS (sore throat, sores in mouth, or a toothache) UNUSUAL RASH, SWELLING OR PAIN  UNUSUAL VAGINAL DISCHARGE OR ITCHING   Items with * indicate a potential emergency and should be followed up as soon as  possible or go to the Emergency Department if any problems should occur.  Please show the CHEMOTHERAPY ALERT CARD or IMMUNOTHERAPY ALERT CARD at check-in to the Emergency Department and triage nurse.  Should you have questions after your visit or need to cancel or reschedule your appointment, please contact [facility] [place] - A DEPT OF [facility] [phone]  and follow the prompts.  Office hours are 8:00 a.m. to 4:30 p.m. Monday - Friday. Please note that voicemails left after 4:00 p.m. may not be returned until the following business day.  We are closed weekends and major holidays. You have access to a nurse at all times for urgent questions. Please call the main number to the clinic [phone] and follow the prompts.  For any non-urgent questions, you may also contact your provider using MyChart. We now offer e-Visits for anyone 59 and older to request care online for non-urgent symptoms. For details visit [email or web address].   Also download the MyChart app! Go to the app store, search MyChart, open the app, select [place], and log in with your MyChart username and password.

## 2025-01-26 ENCOUNTER — Inpatient Hospital Stay: Attending: Hematology | Admitting: Oncology

## 2025-01-26 ENCOUNTER — Inpatient Hospital Stay
